# Patient Record
Sex: Female | Born: 1940
Health system: Southern US, Community
[De-identification: ages and names within clinical notes are randomized; demographics above are authoritative.]

## PROBLEM LIST (undated history)

## (undated) DIAGNOSIS — N179 Acute kidney failure, unspecified: Secondary | ICD-10-CM

## (undated) DIAGNOSIS — E785 Hyperlipidemia, unspecified: Secondary | ICD-10-CM

## (undated) DIAGNOSIS — Z923 Personal history of irradiation: Secondary | ICD-10-CM

## (undated) DIAGNOSIS — K921 Melena: Secondary | ICD-10-CM

## (undated) DIAGNOSIS — M8580 Other specified disorders of bone density and structure, unspecified site: Secondary | ICD-10-CM

## (undated) DIAGNOSIS — K219 Gastro-esophageal reflux disease without esophagitis: Secondary | ICD-10-CM

## (undated) DIAGNOSIS — K920 Hematemesis: Secondary | ICD-10-CM

## (undated) DIAGNOSIS — Z8379 Family history of other diseases of the digestive system: Secondary | ICD-10-CM

## (undated) DIAGNOSIS — I10 Essential (primary) hypertension: Secondary | ICD-10-CM

## (undated) DIAGNOSIS — R112 Nausea with vomiting, unspecified: Secondary | ICD-10-CM

## (undated) DIAGNOSIS — K922 Gastrointestinal hemorrhage, unspecified: Secondary | ICD-10-CM

## (undated) DIAGNOSIS — T8859XA Other complications of anesthesia, initial encounter: Secondary | ICD-10-CM

## (undated) DIAGNOSIS — Z9889 Other specified postprocedural states: Secondary | ICD-10-CM

## (undated) DIAGNOSIS — D62 Acute posthemorrhagic anemia: Secondary | ICD-10-CM

## (undated) DIAGNOSIS — Z8719 Personal history of other diseases of the digestive system: Secondary | ICD-10-CM

## (undated) DIAGNOSIS — I739 Peripheral vascular disease, unspecified: Secondary | ICD-10-CM

## (undated) DIAGNOSIS — T4145XA Adverse effect of unspecified anesthetic, initial encounter: Secondary | ICD-10-CM

## (undated) HISTORY — PX: CATARACT EXTRACTION W/ INTRAOCULAR LENS IMPLANT: SHX1309

## (undated) HISTORY — DX: Essential (primary) hypertension: I10

## (undated) HISTORY — DX: Gastro-esophageal reflux disease without esophagitis: K21.9

## (undated) HISTORY — PX: BLADDER SURGERY: SHX569

## (undated) HISTORY — PX: ABDOMINAL HYSTERECTOMY: SHX81

## (undated) HISTORY — DX: Family history of other diseases of the digestive system: Z83.79

## (undated) HISTORY — PX: PARTIAL HYSTERECTOMY: SHX80

## (undated) HISTORY — DX: Personal history of other diseases of the digestive system: Z87.19

## (undated) HISTORY — DX: Hyperlipidemia, unspecified: E78.5

## (undated) HISTORY — PX: BREAST SURGERY: SHX581

---
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
## 1998-10-23 ENCOUNTER — Other Ambulatory Visit: Admission: RE | Admit: 1998-10-23 | Discharge: 1998-10-23 | Payer: Self-pay | Admitting: *Deleted

## 2000-06-16 ENCOUNTER — Encounter: Admission: RE | Admit: 2000-06-16 | Discharge: 2000-06-16 | Payer: Self-pay | Admitting: Family Medicine

## 2000-06-16 ENCOUNTER — Encounter: Payer: Self-pay | Admitting: Family Medicine

## 2000-07-29 ENCOUNTER — Encounter: Payer: Self-pay | Admitting: Neurosurgery

## 2000-07-29 ENCOUNTER — Ambulatory Visit (HOSPITAL_COMMUNITY): Admission: RE | Admit: 2000-07-29 | Discharge: 2000-07-29 | Payer: Self-pay | Admitting: Neurosurgery

## 2000-08-23 ENCOUNTER — Ambulatory Visit (HOSPITAL_COMMUNITY): Admission: RE | Admit: 2000-08-23 | Discharge: 2000-08-24 | Payer: Self-pay | Admitting: Neurosurgery

## 2000-08-23 ENCOUNTER — Encounter: Payer: Self-pay | Admitting: Neurosurgery

## 2000-08-23 HISTORY — PX: CERVICAL DISCECTOMY: SHX98

## 2000-10-13 ENCOUNTER — Encounter: Payer: Self-pay | Admitting: Neurosurgery

## 2000-10-13 ENCOUNTER — Ambulatory Visit (HOSPITAL_COMMUNITY): Admission: RE | Admit: 2000-10-13 | Discharge: 2000-10-13 | Payer: Self-pay | Admitting: Neurosurgery

## 2003-01-24 ENCOUNTER — Ambulatory Visit (HOSPITAL_COMMUNITY): Admission: RE | Admit: 2003-01-24 | Discharge: 2003-01-24 | Payer: Self-pay | Admitting: Gastroenterology

## 2003-02-20 ENCOUNTER — Encounter: Admission: RE | Admit: 2003-02-20 | Discharge: 2003-02-20 | Payer: Self-pay | Admitting: Gastroenterology

## 2003-02-20 ENCOUNTER — Encounter: Payer: Self-pay | Admitting: Gastroenterology

## 2010-07-09 ENCOUNTER — Encounter: Payer: Self-pay | Admitting: Family Medicine

## 2010-07-09 ENCOUNTER — Ambulatory Visit: Payer: Self-pay | Admitting: Cardiovascular Disease

## 2010-07-11 ENCOUNTER — Encounter: Payer: Self-pay | Admitting: Family Medicine

## 2010-08-09 ENCOUNTER — Encounter: Payer: Self-pay | Admitting: Family Medicine

## 2010-08-21 NOTE — Letter (Signed)
Summary: [facility]   Imported By: Maryln Gottron 07/22/2010 10:13:49  _____________________________________________________________________  External Attachment:    Type:   Image     Comment:   External Document

## 2010-12-05 NOTE — Op Note (Signed)
[place]. [facility]  Patient:    Carla Foster, Carla Foster                        MRN: [id number] Proc. Date: 08/23/00 Adm. Date:  [id number] Attending:  Gerald Dexter                           Operative Report  PREOPERATIVE DIAGNOSIS:  Spondylosis, C5-6 left.  Herniated nucleus pulposus C6-7.  POSTOPERATIVE DIAGNOSIS:  Spondylosis, C5-6 left.  Herniated nucleus pulposus C6-7.  OPERATION PERFORMED:  C5-6, C6-7 anterior cervical diskectomy with fibular bone bank fusion followed by Atlantis anterior cervical plating with the operating microscope.  SURGEON:  Reinaldo Meeker, M.D.  ASSISTANT:  Julio Sicks, M.D.  ANESTHESIA:  DESCRIPTION OF PROCEDURE:  After being placed in supine position with 10 pounds halter traction, patients neck was prepped and draped in the usual sterile fashion.  A localizing x-ray was taken with fluoroscopy prior to incision to identify the appropriate level.  A transverse incision was made in the right anterior neck starting at the midline heading towards the medial aspect of the sternocleidomastoid muscle.  The platysma muscle was then incised transversely.  The natural fascial plane between the strap muscles medially, sternocleidomastoid laterally was identified and followed down to the anterior aspect of the cervical spine.  The longus colli muscles were identified and split in the midline and stripped away bilaterally with the [employer] information [employer] manager.  Self-retaining retractor was placed for exposure.  A second fluoroscopy was taken which showed approach to the appropriate levels.  Using a 15 blade, the annulus of the disk at C5-6 and C6-7 was incised.  Using pituitary rongeurs and curets as well as high speed drill to widen the interspace, approximately 90% of the disk  material was removed.  The microscope was then draped and brought into the field and used for the remainder of the case.  Using microdissection  technique, the remainder of the disk material down to the posterior longitudinal ligament of C6-7 was removed.  The posterior longitudinal ligament was then incised transversely and the cut edges removed with the Kerrison punch.  Inspection of the symptomatic left side showed a large amount of disk material within the foramen and this was removed.  This gave excellent decompression of the underlhying C7 nerve root.  A generous decompression was carried out all across the spinal dura and then the proximal C7 nerve root was identified on the right.  At this point inspection was carried out for evidence of any residual compression at this level and none could be identified.  Large amounts of irrigation were carried out and Gelfoam was placed at this time to control bleeding.  Inspection was then turned to C5-6.  Once again high speed drill was used throughout the interspace and disk material removed down to the posterior longitudinal ligament and was once again incised transversely and the cut ends were removed.  A generous foraminal decompression was carried out on the patients left symptomatic side at C5-6 to decompress the underlying C6 nerve root.  At this point, inspection was carried out in all directions for any evidence of residual compression at either level and none could be identified.  Large amounts of irrigation were carried out.  Any bleeding was controlled with bipolar coagulation and Gelfoam.  Measurements were taken and two 6 mm bone bank plugs were reconstituted.  After  irrigating once more, bone was impacted without difficulty.  Fluoroscopy showed the plugs to be in good position.  An appropriate length Atlantis plate was then chosen.  Under fluoroscopic guidance, pilot holes were drilled, tapped and 14 mm screws placed without difficulty.  These were placed up in the C5, C6 and C7 vertebral bodies.  Locking screws were then tightened down.  Large amounts of irrigation were  then carried out at this time and any bleeding controlled with bipolar coagulation.  The wound was then closed using interrupted Vicryl in the platysma muscle and inverted 5-0 PDS in the subcuticular layer and staples on the skin.  Sterile dressing and cervical collar were applied.  The patient was extubated and taken to the recovery room in stable condition. DD:  08/23/00 TD:  08/23/00 Job: [id number]

## 2010-12-05 NOTE — Op Note (Signed)
   NAME:  Carla Foster, Carla Foster                           ACCOUNT NO.:  [id number]   MEDICAL RECORD NO.:  [id number]                   PATIENT TYPE:  AMB   LOCATION:  ENDO                                 FACILITY:  [facility]   PHYSICIAN:  Anselmo Rod, M.D.               DATE OF BIRTH:  1941/05/25   DATE OF PROCEDURE:  01/24/2003  DATE OF DISCHARGE:                                 OPERATIVE REPORT   PROCEDURE PERFORMED:  Colonoscopy.   ENDOSCOPIST:  Anselmo Rod, M.D.   INSTRUMENT USED:  Olympus video colonoscope.   INDICATIONS FOR PROCEDURE:  Iron deficiency anemia.   HISTORY:  This 70 year old white female undergoing EGD to rule out colonic  polyps, masses, hemorrhoids, etc.   PREPROCEDURE PREPARATION:  Informed consent was procured from the patient.  The patient was fasted for eight hours prior to the procedure and prepped  with a bottle of magnesium citrate and a gallon of GoLYTELY the night prior  to the procedure.   PREPROCEDURE PHYSICAL:  VITAL SIGNS:  The patient had stable vital signs.  NECK:  Supple.  CHEST:  Clear to auscultation.  S1 and S2 regular.  ABDOMEN:  Soft with normal bowel sounds.   DESCRIPTION OF PROCEDURE:  The patient was placed in the left lateral  decubitus position and sedated with an additional 20 mg of Demerol and 3 mg  of Versed intravenously. Once the patient was adequately sedated and  maintained on low-flow oxygen and continuous cardiac monitoring, the Olympus  video colonoscope was advanced from the rectum to the cecum with difficulty.  There was a large amount of residual stool in the colon.  No masses or  polyps were seen.  There was no evidence of diverticulosis, small internal  hemorrhoids are present.  On retroflexion in the rectum, the appendicular  orifice and the ileocecal valve were visualized and photographed.  The  patient tolerated the procedure well without complications.  Small lesions  could have been missed.   IMPRESSION:  1.  Small, nonbleeding internal hemorrhoids.  2. No masses, polyps, or diverticulosis noted.  3. Large amount of residual stool in the colon.  Small lesions could have     been missed.    RECOMMENDATIONS:  1. Continue close monitoring of CBC.  2. Outpatient followup in the next two weeks with further recommendations.                                               Anselmo Rod, M.D.    JNM/MEDQ  D:  01/24/2003  T:  01/25/2003  Job:  [id number]   cc:   Marjory Lies, M.D.  [address]  [place]  [place] [address]  Fax: [phone]

## 2010-12-05 NOTE — Op Note (Signed)
   NAME:  Carla Foster, Carla Foster                           ACCOUNT NO.:  [id number]   MEDICAL RECORD NO.:  [id number]                   PATIENT TYPE:  AMB   LOCATION:  ENDO                                 FACILITY:  [facility]   PHYSICIAN:  Anselmo Rod, M.D.               DATE OF BIRTH:  Jun 04, 1941   DATE OF PROCEDURE:  01/24/2003  DATE OF DISCHARGE:                                 OPERATIVE REPORT   PROCEDURE PERFORMED:  Esophagogastroduodenoscopy.   ENDOSCOPIST:  Charna Elizabeth, M.D.   INSTRUMENT USED:  Olympus video panendoscope.   INDICATIONS FOR PROCEDURE:  The patient is a 70 year old with a history of  iron deficiency.  Rule out peptic ulcer disease, esophagitis, gastritis,  etc.   PREPROCEDURE PREPARATION:  Informed consent was procured from the patient.  The patient was fasted for eight hours prior to the procedure and prepped  with  a bottle of magnesium citrate and a gallon of GoLYTELY the night prior  to the procedure.   PREPROCEDURE PHYSICAL:  The patient had stable vital signs.  Neck supple,  chest clear to auscultation.  S1, S2 regular.  Abdomen soft with normal  bowel sounds.   DESCRIPTION OF PROCEDURE:  The patient was placed in the left lateral  decubitus position and sedated with 50 mg of Demerol and 5 mg of Versed  intravenously.  Once the patient was adequately sedated and maintained on  low-flow oxygen and continuous cardiac monitoring, the Olympus video  panendoscope was advanced through the mouth piece over the tongue into the  esophagus under direct vision.  The entire esophagus appeared normal with no  evidence of ring, stricture, masses, esophagitis or Barrett's mucosa.  The  scope was then advanced to the stomach.  The entire gastric mucosa and  proximal small bowel appeared normal.   IMPRESSION:  Normal esophagogastroduodenoscopy.    RECOMMENDATIONS:  Proceed with colonoscopy at this time.  Further  recommendations made after colonoscopy.                                         Anselmo Rod, M.D.    JNM/MEDQ  D:  01/24/2003  T:  01/25/2003  Job:  [id number]   cc:   Marjory Lies, M.D.  [address]  [place]  [place] [address]  Fax: [phone]

## 2011-01-01 ENCOUNTER — Other Ambulatory Visit: Payer: Self-pay | Admitting: Cardiovascular Disease

## 2011-01-01 DIAGNOSIS — E785 Hyperlipidemia, unspecified: Secondary | ICD-10-CM

## 2011-01-01 MED ORDER — FENOFIBRATE 145 MG PO TABS: 145.0000 mg | ORAL_TABLET | Freq: Every day | ORAL | Status: DC

## 2011-01-01 NOTE — Telephone Encounter (Signed)
Refills done, spoke with pt and informed to come in fasting, labs ordered.Alfonso Ramus RN

## 2011-01-13 ENCOUNTER — Other Ambulatory Visit: Payer: Self-pay | Admitting: Cardiovascular Disease

## 2011-01-14 ENCOUNTER — Encounter: Payer: Self-pay | Admitting: *Deleted

## 2011-01-23 ENCOUNTER — Other Ambulatory Visit (INDEPENDENT_AMBULATORY_CARE_PROVIDER_SITE_OTHER): Payer: Medicare Other | Admitting: *Deleted

## 2011-01-23 ENCOUNTER — Encounter: Payer: Self-pay | Admitting: Cardiovascular Disease

## 2011-01-23 ENCOUNTER — Ambulatory Visit (INDEPENDENT_AMBULATORY_CARE_PROVIDER_SITE_OTHER): Payer: Medicare Other | Admitting: Cardiovascular Disease

## 2011-01-23 DIAGNOSIS — E1169 Type 2 diabetes mellitus with other specified complication: Secondary | ICD-10-CM | POA: Insufficient documentation

## 2011-01-23 DIAGNOSIS — E785 Hyperlipidemia, unspecified: Secondary | ICD-10-CM | POA: Insufficient documentation

## 2011-01-23 DIAGNOSIS — I10 Essential (primary) hypertension: Secondary | ICD-10-CM

## 2011-01-23 LAB — BASIC METABOLIC PANEL
BUN: 28 mg/dL — ABNORMAL HIGH (ref 6–23)
CO2: 28 mEq/L (ref 19–32)
Calcium: 9.4 mg/dL (ref 8.4–10.5)
Chloride: 103 mEq/L (ref 96–112)
Creatinine, Ser: 1.1 mg/dL (ref 0.4–1.2)
GFR: 50.87 mL/min — ABNORMAL LOW (ref >60.00)
Glucose, Bld: 121 mg/dL — ABNORMAL HIGH (ref 70–99)
Potassium: 5.3 mEq/L — ABNORMAL HIGH (ref 3.5–5.1)
Sodium: 136 mEq/L (ref 135–145)

## 2011-01-23 LAB — LIPID PANEL
Cholesterol: 145 mg/dL (ref 0–200)
HDL: 69.2 mg/dL (ref >39.00)
LDL Cholesterol: 64 mg/dL (ref 0–99)
Total CHOL/HDL Ratio: 2
Triglycerides: 59 mg/dL (ref 0.0–149.0)
VLDL: 11.8 mg/dL (ref 0.0–40.0)

## 2011-01-23 LAB — HEPATIC FUNCTION PANEL
ALT: 18 U/L (ref 0–35)
AST: 29 U/L (ref 0–37)
Albumin: 4.4 g/dL (ref 3.5–5.2)
Alkaline Phosphatase: 21 U/L — ABNORMAL LOW (ref 39–117)
Bilirubin, Direct: 0 mg/dL (ref 0.0–0.3)
Total Bilirubin: 0.2 mg/dL — ABNORMAL LOW (ref 0.3–1.2)
Total Protein: 6.9 g/dL (ref 6.0–8.3)

## 2011-01-23 NOTE — Progress Notes (Signed)
Carla Foster Date of Birth  1941/06/29 [facility] [address]     [address] [place], [place]  [address] [phone]  Fax  [phone]  History of Present Illness:  Doing well.  Ate some ribs several days ago.  BP is up a bit.  She denies any chest pain or shortness of breath.  Current Outpatient Prescriptions on File Prior to Visit  Medication Sig Dispense Refill  . amLODipine (NORVASC) 5 MG tablet TAKE 1 TABLET BY MOUTH DAILY  90 tablet  0  . aspirin 81 MG tablet Take 81 mg by mouth daily.        . Calcium Carbonate-Vitamin D (CALCIUM 600 + D PO) Take 1 tablet by mouth daily.        . carvedilol (COREG) 12.5 MG tablet Take 12.5 mg by mouth 2 (two) times daily with a meal.        . cholecalciferol (VITAMIN D) 1000 UNITS tablet Take 1,000 Units by mouth daily.        . fish oil-omega-3 fatty acids 1000 MG capsule Take 1 g by mouth daily.        . Flaxseed, Linseed, (FLAX SEED OIL PO) Take 1 capsule by mouth daily.        Marland Kitchen lisinopril (PRINIVIL,ZESTRIL) 40 MG tablet TAKE 1 TABLET BY MOUTH DAILY  90 tablet  0  . metFORMIN (GLUCOPHAGE) 1000 MG tablet Take 1,000 mg by mouth 2 (two) times daily with a meal.        . simvastatin (ZOCOR) 20 MG tablet TAKE 1 TABLET BY MOUTH AT BEDTIME  90 tablet  3  . THYROID PO Take 1 tablet by mouth daily. ( OTC thyroid herb )       . TRICOR 145 MG tablet TAKE 1 TABLET BY MOUTH AT BEDTIME  90 tablet  3  . vitamin C (ASCORBIC ACID) 500 MG tablet Take 500 mg by mouth daily.          Allergies  Allergen Reactions  . Tiazac (Diltiazem Hcl)     Ankle edema    Past Medical History  Diagnosis Date  . Hypertension   . Hyperlipidemia   . Diabetes mellitus     type 2  . Asthma     Past Surgical History  Procedure Date  . Partial hysterectomy   . Cervical discectomy 08/23/00    C5-6, C6-7 anterior cervical diskectomy with fibular bone bank fusion followed by Atlantis anterior cervical plating with the  operating microscope  --  SURGEON:  Reinaldo Meeker, M.D.    History  Smoking status  . Former Smoker -- 1.0 packs/day for 18 years  . Types: Cigarettes  . Quit date: 01/13/1974  Smokeless tobacco  . Never Used    History  Alcohol Use No    Family History  Problem Relation Age of Onset  . Emphysema Father   . Stroke Mother   . Hypertension Mother   . Atrial fibrillation Mother     heart flutter    Reviw of Systems:  Reviewed in the HPI.  All other systems are negative.  Physical Exam: BP 152/80  Pulse 70  Ht 5\' 6"  (1.676 m)  Wt 173 lb (78.472 kg)  BMI 27.92 kg/m2 The patient is alert and oriented x 3.  The mood and affect are normal.   Skin: warm and dry.  Color is normal.    HEENT:   the sclera are nonicteric.  The mucous membranes are moist.  The carotids are 2+ without bruits.  There is no thyromegaly.  There is no JVD.    Lungs: clear.  The chest wall is non tender.    Heart: regular rate with a normal S1 and S2.  There are no murmurs, gallops, or rubs. The PMI is not displaced.     Abdomin: good bowel sounds.  There is no guarding or rebound.  There is no hepatosplenomegaly or tenderness.  There are no masses.   Extremities:  no clubbing, cyanosis, or edema.  The legs are without rashes.  The distal pulses are intact.   Neuro:  Cranial nerves II - XII are intact.  Motor and sensory functions are intact.    The gait is normal.  Assessment / Plan:

## 2011-01-23 NOTE — Assessment & Plan Note (Signed)
Her blood pressure remains well controlled. We'll continue with her same medications. I've asked her to watch her salt intake and to exercise on a daily basis.

## 2011-04-08 ENCOUNTER — Other Ambulatory Visit: Payer: Self-pay | Admitting: *Deleted

## 2011-04-08 DIAGNOSIS — E785 Hyperlipidemia, unspecified: Secondary | ICD-10-CM

## 2011-04-08 MED ORDER — AMLODIPINE BESYLATE 5 MG PO TABS: 5.0000 mg | ORAL_TABLET | Freq: Every day | ORAL | Status: DC

## 2011-04-08 MED ORDER — LISINOPRIL 40 MG PO TABS: 40.0000 mg | ORAL_TABLET | Freq: Every day | ORAL | Status: DC

## 2011-04-08 NOTE — Telephone Encounter (Signed)
Fax received from pharmacy. Refill completed. Jodette Saory Carriero RN  

## 2011-07-15 ENCOUNTER — Other Ambulatory Visit: Payer: Self-pay | Admitting: *Deleted

## 2011-07-15 MED ORDER — CARVEDILOL 12.5 MG PO TABS: 12.5000 mg | ORAL_TABLET | Freq: Two times a day (BID) | ORAL | Status: DC

## 2011-07-28 ENCOUNTER — Ambulatory Visit (INDEPENDENT_AMBULATORY_CARE_PROVIDER_SITE_OTHER): Payer: Medicare Other | Admitting: Cardiovascular Disease

## 2011-07-28 ENCOUNTER — Encounter: Payer: Self-pay | Admitting: Cardiovascular Disease

## 2011-07-28 VITALS — BP 188/78 | HR 79 | Ht 67.0 in | Wt 163.0 lb

## 2011-07-28 DIAGNOSIS — I10 Essential (primary) hypertension: Secondary | ICD-10-CM

## 2011-07-28 DIAGNOSIS — E785 Hyperlipidemia, unspecified: Secondary | ICD-10-CM

## 2011-07-28 MED ORDER — NEBIVOLOL HCL 20 MG PO TABS: 20.0000 mg | ORAL_TABLET | ORAL | Status: DC

## 2011-07-28 MED ORDER — FENOFIBRATE 160 MG PO TABS: 160.0000 mg | ORAL_TABLET | Freq: Every day | ORAL | Status: DC

## 2011-07-28 NOTE — Assessment & Plan Note (Signed)
Carla Foster still has an elevated blood pressure. It is clear that she still eating salty food. She had fried bologna this morning for breakfast.  I've asked her to decrease her salt intake. We'll stop the carvedilol and start her on Bystolic 20 mg a day. I'll see her back in the office in 3 months.

## 2011-07-28 NOTE — Progress Notes (Signed)
Carla Foster Date of Birth  09-16-1940 [name]  [address]    [address]   [address] [place], [place]  [address]    [place], [place]  [address] [phone]  Fax  [phone]  [phone]  Fax [phone]   History of Present Illness:  Carla Foster is a 71 year old female with a history of hypertension and hyperlipidemia. She also has a history of type 2 diabetes mellitus. She's been having problems with elevated blood pressure recently. I saw her in July for hypertension.  She feels fine. She's not had any episodes of chest pain or shortness breath. She continues to lose weight.  She does need a lot of salt but admits to eating some fried bologna this morning.  Current Outpatient Prescriptions on File Prior to Visit  Medication Sig Dispense Refill  . amLODipine (NORVASC) 5 MG tablet Take 1 tablet (5 mg total) by mouth daily.  90 tablet  1  . aspirin 81 MG tablet Take 81 mg by mouth daily.        . Calcium Carbonate-Vitamin D (CALCIUM 600 + D PO) Take 1 tablet by mouth daily.        . carvedilol (COREG) 12.5 MG tablet Take 1 tablet (12.5 mg total) by mouth 2 (two) times daily with a meal.  60 tablet  6  . cholecalciferol (VITAMIN D) 1000 UNITS tablet Take 1,000 Units by mouth daily.        . fish oil-omega-3 fatty acids 1000 MG capsule Take 1 g by mouth daily.        Carla Foster lisinopril (PRINIVIL,ZESTRIL) 40 MG tablet Take 1 tablet (40 mg total) by mouth daily.  90 tablet  1  . metFORMIN (GLUCOPHAGE) 1000 MG tablet Take 1,000 mg by mouth 2 (two) times daily with a meal.        . simvastatin (ZOCOR) 20 MG tablet TAKE 1 TABLET BY MOUTH AT BEDTIME  90 tablet  3  . THYROID PO Take 1 tablet by mouth daily. ( OTC thyroid herb )       . TRICOR 145 MG tablet TAKE 1 TABLET BY MOUTH AT BEDTIME  90 tablet  3  . vitamin C (ASCORBIC ACID) 500 MG tablet Take 500 mg by mouth daily.        . Flaxseed, Linseed, (FLAX SEED OIL PO) Take 1 capsule by mouth daily.           Allergies  Allergen Reactions  . Tiazac (Diltiazem Hcl)     Ankle edema    Past Medical History  Diagnosis Date  . Hypertension   . Hyperlipidemia   . Diabetes mellitus     type 2  . Asthma     Past Surgical History  Procedure Date  . Partial hysterectomy   . Cervical discectomy 08/23/00    C5-6, C6-7 anterior cervical diskectomy with fibular bone bank fusion followed by Atlantis anterior cervical plating with the operating microscope  --  SURGEON:  Reinaldo Meeker, M.D.    History  Smoking status  . Former Smoker -- 1.0 packs/day for 18 years  . Types: Cigarettes  . Quit date: 01/13/1974  Smokeless tobacco  . Never Used    History  Alcohol Use No    Family History  Problem Relation Age of Onset  . Emphysema Father   . Stroke Mother   . Hypertension Mother   . Atrial fibrillation Mother  heart flutter    Reviw of Systems:  Reviewed in the HPI.  All other systems are negative.  Physical Exam: BP 188/78  Pulse 79  Ht 5\' 7"  (1.702 m)  Wt 163 lb (73.936 kg)  BMI 25.53 kg/m2 The patient is alert and oriented x 3.  The mood and affect are normal.   Skin: warm and dry.  Color is normal.    HEENT:   Normocephalic/atraumatic. Her neck is supple. Mucous membranes are moist. The carotids are 2+ without bruits. Is no JVD.  Lungs: Lungs are clear.   Heart: Regular rate S1-S2.    Abdomen: Good bowel sounds. There was no hepatosplenomegaly.  Extremities:  No clubbing cyanosis or edema  Neuro:  Exam is nonfocal.    ECG: Normal sinus rhythm. She has no ST or T wave changes.  Assessment / Plan:

## 2011-07-28 NOTE — Patient Instructions (Signed)
Your physician wants you to follow-up in: 3 MONTH You will receive a reminder letter in the mail two months in advance. If you don't receive a letter, please call our office to schedule the follow-up appointment.  Your physician recommends that you return for a FASTING lipid profile: 3 MONTHS   Your physician has recommended you make the following change in your medication:   STOP COREG/ NOT STRONG ENOUGH FOR YOUR HTN  START BYSTOLIC 20 MG DAILY  AVOID SALTY FOOD LIKE BOLOGNA, HAM, FAST FOOD, FRIED FOODS.

## 2011-07-30 ENCOUNTER — Telehealth: Payer: Self-pay | Admitting: Cardiovascular Disease

## 2011-07-30 NOTE — Telephone Encounter (Signed)
Left message

## 2011-07-30 NOTE — Telephone Encounter (Signed)
New problem:  Please clarify which medication patient suppose to be discontinue.

## 2011-07-30 NOTE — Telephone Encounter (Signed)
Spoke with patient and advised it was the Carvedilol she needed to stop per Dr Harvie Bridge dictation.  She was uncertain on Rx

## 2011-10-12 ENCOUNTER — Other Ambulatory Visit: Payer: Self-pay | Admitting: *Deleted

## 2011-10-12 DIAGNOSIS — E785 Hyperlipidemia, unspecified: Secondary | ICD-10-CM

## 2011-10-12 MED ORDER — AMLODIPINE BESYLATE 5 MG PO TABS: 5.0000 mg | ORAL_TABLET | Freq: Every day | ORAL | Status: DC

## 2011-10-12 MED ORDER — LISINOPRIL 40 MG PO TABS: 40.0000 mg | ORAL_TABLET | Freq: Every day | ORAL | Status: DC

## 2011-10-12 NOTE — Telephone Encounter (Signed)
Fax Received. Refill Completed. Darrah Dredge Chowoe (R.M.A)   

## 2011-10-12 NOTE — Telephone Encounter (Signed)
Fax Received. Refill Completed. Carla Foster (R.M.A)   

## 2011-10-26 ENCOUNTER — Ambulatory Visit: Payer: Medicare Other | Admitting: Cardiovascular Disease

## 2011-11-18 ENCOUNTER — Ambulatory Visit (INDEPENDENT_AMBULATORY_CARE_PROVIDER_SITE_OTHER): Payer: Medicare Other | Admitting: Cardiovascular Disease

## 2011-11-18 ENCOUNTER — Encounter: Payer: Self-pay | Admitting: Cardiovascular Disease

## 2011-11-18 VITALS — BP 135/80 | HR 69 | Ht 67.0 in | Wt 160.4 lb

## 2011-11-18 DIAGNOSIS — E785 Hyperlipidemia, unspecified: Secondary | ICD-10-CM

## 2011-11-18 DIAGNOSIS — I10 Essential (primary) hypertension: Secondary | ICD-10-CM

## 2011-11-18 MED ORDER — CARVEDILOL 25 MG PO TABS: 25.0000 mg | ORAL_TABLET | Freq: Two times a day (BID) | ORAL | Status: DC

## 2011-11-18 NOTE — Progress Notes (Signed)
Carla Foster Date of Birth  [birth year] [facility] Office  [address]    [address]   [address] [place], [place]  [address]    [place], [place]  [address] [phone]  Fax  [phone]  [phone]  Fax [phone]  Problems: 1. Hypertension 2. Hyperlipidemia 3. Diabetes mellitus  History of Present Illness:  [name] is a 71 year old female with a history of hypertension and hyperlipidemia. She also has a history of type 2 diabetes mellitus. She's been having problems with elevated blood pressure recently. I saw her in July for hypertension.  She feels fine. She's not had any episodes of chest pain or shortness breath. She continues to lose weight.  She has been found to watch her salt intake. She used to use a lot of fried bologna.  Current Outpatient Prescriptions on File Prior to Visit  Medication Sig Dispense Refill  . amLODipine (NORVASC) 5 MG tablet Take 1 tablet (5 mg total) by mouth daily.  90 tablet  3  . aspirin 81 MG tablet Take 81 mg by mouth daily.        . Calcium Carbonate-Vitamin D (CALCIUM 600 + D PO) Take 1 tablet by mouth daily.        . cholecalciferol (VITAMIN D) 1000 UNITS tablet Take 1,000 Units by mouth daily.        . fenofibrate 160 MG tablet Take 1 tablet (160 mg total) by mouth daily.  30 tablet  5  . fish oil-omega-3 fatty acids 1000 MG capsule Take 1 g by mouth daily.        . Flaxseed, Linseed, (FLAX SEED OIL PO) Take 1 capsule by mouth daily.        Marland Kitchen lisinopril (PRINIVIL,ZESTRIL) 40 MG tablet Take 1 tablet (40 mg total) by mouth daily.  90 tablet  3  . metFORMIN (GLUCOPHAGE) 1000 MG tablet Take 1,000 mg by mouth 2 (two) times daily with a meal.        . Nebivolol HCl (BYSTOLIC) 20 MG TABS Take 1 tablet (20 mg total) by mouth 1 day or 1 dose.  30 tablet  5  . simvastatin (ZOCOR) 20 MG tablet TAKE 1 TABLET BY MOUTH AT BEDTIME  90 tablet  3  . THYROID PO Take 1 tablet by mouth daily. ( OTC thyroid herb )         . vitamin C (ASCORBIC ACID) 500 MG tablet Take 500 mg by mouth daily.          Allergies  Allergen Reactions  . Tiazac (Diltiazem Hcl)     Ankle edema    Past Medical History  Diagnosis Date  . Hypertension   . Hyperlipidemia   . Diabetes mellitus     type 2  . Asthma     Past Surgical History  Procedure Date  . Partial hysterectomy   . Cervical discectomy 08/23/00    C5-6, C6-7 anterior cervical diskectomy with fibular bone bank fusion followed by Atlantis anterior cervical plating with the operating microscope  --  SURGEON:  Reinaldo Meeker, M.D.    History  Smoking status  . Former Smoker -- 1.0 packs/day for 18 years  . Types: Cigarettes  . Quit date: 01/13/1974  Smokeless tobacco  . Never Used    History  Alcohol Use No    Family History  Problem Relation Age of Onset  . Emphysema Father   . Stroke Mother   .  Hypertension Mother   . Atrial fibrillation Mother     heart flutter    Reviw of Systems:  Reviewed in the HPI.  All other systems are negative.  Physical Exam: BP 162/90  Pulse 69  Ht 5\' 7"  (1.702 m)  Wt 160 lb 6.4 oz (72.757 kg)  BMI 25.12 kg/m2 The patient is alert and oriented x 3.  The mood and affect are normal.   Skin: warm and dry.  Color is normal.    HEENT:   Normocephalic/atraumatic. Her neck is supple. Mucous membranes are moist. The carotids are 2+ without bruits. Is no JVD.  Lungs: Lungs are clear.   Heart: Regular rate S1-S2.  Soft systolic murmur.  Abdomen: Good bowel sounds. There was no hepatosplenomegaly.  Extremities:  No clubbing cyanosis or edema  Neuro:  Exam is nonfocal.    ECG: Normal sinus rhythm. She has no ST or T wave changes.  Assessment / Plan:

## 2011-11-18 NOTE — Assessment & Plan Note (Addendum)
Stable

## 2011-11-18 NOTE — Assessment & Plan Note (Addendum)
Carla Foster is doing fairly well.  She says that the Bystolic is  too expensive. We will change her back to carvedilol but at a slightly higher dose-25 mg twice a day. I'll see her again in 3 months for followup visit.

## 2011-11-18 NOTE — Patient Instructions (Signed)
Your physician recommends that you schedule a follow-up appointment in: 3 months   Your physician has recommended you make the following change in your medication:   Stop bystolic Start carvedilol 25 mg one tablet twice a day 12 hours apart

## 2011-12-28 ENCOUNTER — Other Ambulatory Visit: Payer: Self-pay | Admitting: *Deleted

## 2011-12-28 DIAGNOSIS — E785 Hyperlipidemia, unspecified: Secondary | ICD-10-CM

## 2011-12-28 MED ORDER — SIMVASTATIN 20 MG PO TABS: 20.0000 mg | ORAL_TABLET | Freq: Every day | ORAL | Status: DC

## 2012-01-26 ENCOUNTER — Other Ambulatory Visit: Payer: Self-pay | Admitting: Cardiovascular Disease

## 2012-02-18 ENCOUNTER — Ambulatory Visit: Payer: Medicare Other | Admitting: Cardiovascular Disease

## 2012-02-19 ENCOUNTER — Other Ambulatory Visit: Payer: Medicare Other

## 2012-09-29 ENCOUNTER — Other Ambulatory Visit: Payer: Self-pay | Admitting: *Deleted

## 2012-09-29 DIAGNOSIS — E785 Hyperlipidemia, unspecified: Secondary | ICD-10-CM

## 2012-09-29 MED ORDER — AMLODIPINE BESYLATE 5 MG PO TABS: 5.0000 mg | ORAL_TABLET | Freq: Every day | ORAL | Status: DC

## 2012-09-29 MED ORDER — LISINOPRIL 40 MG PO TABS: 40.0000 mg | ORAL_TABLET | Freq: Every day | ORAL | Status: DC

## 2012-09-29 NOTE — Telephone Encounter (Signed)
Fax Received. Refill Completed. Carla Foster (R.M.A)   

## 2012-09-29 NOTE — Telephone Encounter (Signed)
Fax Received. Refill Completed. Lilly Gasser Chowoe (R.M.A)  NEED APPOINTMENT 

## 2012-11-02 ENCOUNTER — Other Ambulatory Visit: Payer: Self-pay | Admitting: *Deleted

## 2012-11-02 MED ORDER — FENOFIBRATE 160 MG PO TABS: ORAL_TABLET | ORAL | Status: DC

## 2012-11-09 ENCOUNTER — Other Ambulatory Visit: Payer: Self-pay | Admitting: *Deleted

## 2012-11-09 MED ORDER — CARVEDILOL 25 MG PO TABS: 25.0000 mg | ORAL_TABLET | Freq: Two times a day (BID) | ORAL | Status: DC

## 2012-11-09 NOTE — Telephone Encounter (Signed)
Need appointment. Called pt and lm for pt to call office to make 1 year ov. Number provided. Fax Received. Refill Completed. Dastan Krider Chowoe (R.M.A)

## 2013-01-02 ENCOUNTER — Other Ambulatory Visit: Payer: Self-pay | Admitting: Cardiovascular Disease

## 2013-01-02 NOTE — Telephone Encounter (Signed)
Fax Received. Refill Completed. Braiden Rodman Chowoe (R.M.A)   

## 2013-01-10 ENCOUNTER — Other Ambulatory Visit: Payer: Self-pay | Admitting: *Deleted

## 2013-01-10 DIAGNOSIS — E785 Hyperlipidemia, unspecified: Secondary | ICD-10-CM

## 2013-01-10 MED ORDER — LISINOPRIL 40 MG PO TABS: 40.0000 mg | ORAL_TABLET | Freq: Every day | ORAL | Status: DC

## 2013-01-10 NOTE — Telephone Encounter (Signed)
NO REFILLS UNTIL APPOINTMENT Fax Received. Refill Completed. Rodolph Hagemann Chowoe (R.M.A)   

## 2013-01-17 ENCOUNTER — Ambulatory Visit (INDEPENDENT_AMBULATORY_CARE_PROVIDER_SITE_OTHER): Payer: Medicare Other | Admitting: Cardiovascular Disease

## 2013-01-17 ENCOUNTER — Encounter: Payer: Self-pay | Admitting: Cardiovascular Disease

## 2013-01-17 VITALS — BP 144/70 | HR 66 | Ht 67.0 in | Wt 159.0 lb

## 2013-01-17 DIAGNOSIS — E785 Hyperlipidemia, unspecified: Secondary | ICD-10-CM

## 2013-01-17 DIAGNOSIS — I10 Essential (primary) hypertension: Secondary | ICD-10-CM

## 2013-01-17 LAB — HEPATIC FUNCTION PANEL
ALT: 14 U/L (ref 0–35)
AST: 24 U/L (ref 0–37)
Albumin: 3.9 g/dL (ref 3.5–5.2)
Total Protein: 7.3 g/dL (ref 6.0–8.3)

## 2013-01-17 LAB — LIPID PANEL
Cholesterol: 148 mg/dL (ref 0–200)
HDL: 55.5 mg/dL (ref >39.00)
Triglycerides: 89 mg/dL (ref 0.0–149.0)

## 2013-01-17 LAB — BASIC METABOLIC PANEL
CO2: 24 mEq/L (ref 19–32)
Calcium: 9.7 mg/dL (ref 8.4–10.5)
Creatinine, Ser: 1 mg/dL (ref 0.4–1.2)
GFR: 55.1 mL/min — ABNORMAL LOW (ref >60.00)
Sodium: 128 mEq/L — ABNORMAL LOW (ref 135–145)

## 2013-01-17 NOTE — Assessment & Plan Note (Signed)
She is still eating salt.  I have advised her to work on that diet. Will check labs today.  I will see her in 1 year.

## 2013-01-17 NOTE — Patient Instructions (Addendum)
Your physician recommends that you return for a FASTING lipid profile: today  Your physician wants you to follow-up in: 1 year  You will receive a reminder letter in the mail two months in advance. If you don't receive a letter, please call our office to schedule the follow-up appointment.  REDUCE HIGH SODIUM FOODS LIKE CANNED SOUP, GRAVY, SAUCES, READY PREPARED FOODS LIKE FROZEN FOODS; LEAN CUISINE, LASAGNA. BACON, SAUSAGE, LUNCH MEAT, FAST FOODS.Marland Kitchenchips and hotdogs  DASH Diet The DASH diet stands for "Dietary Approaches to Stop Hypertension." It is a healthy eating plan that has been shown to reduce high blood pressure (hypertension) in as little as 14 days, while also possibly providing other significant health benefits. These other health benefits include reducing the risk of breast cancer after menopause and reducing the risk of type 2 diabetes, heart disease, colon cancer, and stroke. Health benefits also include weight loss and slowing kidney failure in patients with chronic kidney disease.  DIET GUIDELINES  Limit salt (sodium). Your diet should contain less than 1500 mg of sodium daily.  Limit refined or processed carbohydrates. Your diet should include mostly whole grains. Desserts and added sugars should be used sparingly.  Include small amounts of heart-healthy fats. These types of fats include nuts, oils, and tub margarine. Limit saturated and trans fats. These fats have been shown to be harmful in the body. CHOOSING FOODS  The following food groups are based on a 2000 calorie diet. See your Registered Dietitian for individual calorie needs. Grains and Grain Products (6 to 8 servings daily)  Eat More Often: Whole-wheat bread, brown rice, whole-grain or wheat pasta, quinoa, popcorn without added fat or salt (air popped).  Eat Less Often: White bread, white pasta, white rice, cornbread. Vegetables (4 to 5 servings daily)  Eat More Often: Fresh, frozen, and canned vegetables.  Vegetables may be raw, steamed, roasted, or grilled with a minimal amount of fat.  Eat Less Often/Avoid: Creamed or fried vegetables. Vegetables in a cheese sauce. Fruit (4 to 5 servings daily)  Eat More Often: All fresh, canned (in natural juice), or frozen fruits. Dried fruits without added sugar. One hundred percent fruit juice ( cup [237 mL] daily).  Eat Less Often: Dried fruits with added sugar. Canned fruit in light or heavy syrup. [employer], Fish, and Poultry (2 servings or less daily. One serving is 3 to 4 oz [85-114 g]).  Eat More Often: Ninety percent or leaner ground beef, tenderloin, sirloin. Round cuts of beef, chicken breast, [place] breast. All fish. Grill, bake, or broil your meat. Nothing should be fried.  Eat Less Often/Avoid: Fatty cuts of meat, [place], or chicken leg, thigh, or wing. Fried cuts of meat or fish. Dairy (2 to 3 servings)  Eat More Often: Low-fat or fat-free milk, low-fat plain or light yogurt, reduced-fat or part-skim cheese.  Eat Less Often/Avoid: Milk (whole, 2%).Whole milk yogurt. Full-fat cheeses. Nuts, Seeds, and Legumes (4 to 5 servings per week)  Eat More Often: All without added salt.  Eat Less Often/Avoid: Salted nuts and seeds, canned beans with added salt. Fats and Sweets (limited)  Eat More Often: Vegetable oils, tub margarines without trans fats, sugar-free gelatin. Mayonnaise and salad dressings.  Eat Less Often/Avoid: Coconut oils, palm oils, butter, stick margarine, cream, half and half, cookies, candy, pie. FOR MORE INFORMATION The Dash Diet Eating Plan: www.dashdiet.org Document Released: 06/25/2011 Document Revised: 09/28/2011 Document Reviewed: 06/25/2011 [facility] Patient Information 2014 [place], [place].

## 2013-01-17 NOTE — Assessment & Plan Note (Signed)
Will check fasting labs tomorrow  

## 2013-01-17 NOTE — Progress Notes (Signed)
Marchia Bond Bardsley Date of Birth  11-May-1941 [facility]  [address]    [address]   [address] [place], [place]  [address]    [place], [place]  [address] [phone]  Fax  [phone]  [phone]  Fax [phone]  Problems: 1. Hypertension 2. Hyperlipidemia 3. Diabetes mellitus  History of Present Illness:  Mrs. Victoriano Lain is a 72 year old female with a history of hypertension and hyperlipidemia. She also has a history of type 2 diabetes mellitus. She's been having problems with elevated blood pressure recently. I saw her in July for hypertension.  She feels fine. She's not had any episodes of chest pain or shortness breath. She continues to lose weight.  She has been told  to watch her salt intake. She used to use a lot of fried bologna.  January 17, 2013:  She is doing well.  No CP , no dyspnea.   Still eating salt.   Current Outpatient Prescriptions on File Prior to Visit  Medication Sig Dispense Refill  . amLODipine (NORVASC) 5 MG tablet Take 1 tablet (5 mg total) by mouth daily.  90 tablet  0  . aspirin 81 MG tablet Take 81 mg by mouth daily.        . Calcium Carbonate-Vitamin D (CALCIUM 600 + D PO) Take 1 tablet by mouth daily.        . carvedilol (COREG) 25 MG tablet Take 1 tablet (25 mg total) by mouth 2 (two) times daily.  60 tablet  1  . cholecalciferol (VITAMIN D) 1000 UNITS tablet Take 1,000 Units by mouth daily.        . fenofibrate 160 MG tablet TAKE 1 TABLET BY MOUTH DAILY  30 tablet  0  . fish oil-omega-3 fatty acids 1000 MG capsule Take 1 g by mouth daily.        . Flaxseed, Linseed, (FLAX SEED OIL PO) Take 1 capsule by mouth daily.        Marland Kitchen lisinopril (PRINIVIL,ZESTRIL) 40 MG tablet Take 1 tablet (40 mg total) by mouth daily.  30 tablet  0  . metFORMIN (GLUCOPHAGE) 1000 MG tablet Take 1,000 mg by mouth 2 (two) times daily with a meal.        . simvastatin (ZOCOR) 20 MG tablet Take 1 tablet (20 mg total) by mouth at bedtime.  90  tablet  3  . THYROID PO Take 1 tablet by mouth daily. ( OTC thyroid herb )       . vitamin C (ASCORBIC ACID) 500 MG tablet Take 500 mg by mouth daily.         No current facility-administered medications on file prior to visit.    Allergies  Allergen Reactions  . Tiazac (Diltiazem Hcl)     Ankle edema    Past Medical History  Diagnosis Date  . Hypertension   . Hyperlipidemia   . Diabetes mellitus     type 2  . Asthma     Past Surgical History  Procedure Laterality Date  . Partial hysterectomy    . Cervical discectomy  08/23/00    C5-6, C6-7 anterior cervical diskectomy with fibular bone bank fusion followed by Atlantis anterior cervical plating with the operating microscope  --  SURGEON:  Reinaldo Meeker, M.D.    History  Smoking status  . Former Smoker -- 1.00 packs/day for 18 years  . Types: Cigarettes  . Quit date: 01/13/1974  Smokeless tobacco  .  Never Used    History  Alcohol Use No    Family History  Problem Relation Age of Onset  . Emphysema Father   . Stroke Mother   . Hypertension Mother   . Atrial fibrillation Mother     heart flutter    Reviw of Systems:  Reviewed in the HPI.  All other systems are negative.  Physical Exam: BP 144/70  Pulse 66  Ht 5\' 7"  (1.702 m)  Wt 159 lb (72.122 kg)  BMI 24.9 kg/m2 The patient is alert and oriented x 3.  The mood and affect are normal.   Skin: warm and dry.  Color is normal.    HEENT:   Normocephalic/atraumatic. Her neck is supple. Mucous membranes are moist. The carotids are 2+ without bruits. Is no JVD.  Lungs: Lungs are clear.   Heart: Regular rate S1-S2.  Soft systolic murmur.  Abdomen: Good bowel sounds. There was no hepatosplenomegaly.  Extremities:  No clubbing cyanosis or edema  Neuro:  Exam is nonfocal.    ECG: January 17, 2013:  Normal sinus rhythm at 66, . She has no ST or T wave changes.  Assessment / Plan:

## 2013-01-18 ENCOUNTER — Other Ambulatory Visit: Payer: Self-pay | Admitting: *Deleted

## 2013-01-18 MED ORDER — CARVEDILOL 25 MG PO TABS: 25.0000 mg | ORAL_TABLET | Freq: Two times a day (BID) | ORAL | Status: DC

## 2013-01-18 NOTE — Telephone Encounter (Signed)
Fax Received. Refill Completed. Carla Foster (R.M.A)   

## 2013-01-24 ENCOUNTER — Other Ambulatory Visit: Payer: Self-pay | Admitting: Cardiovascular Disease

## 2013-01-24 ENCOUNTER — Other Ambulatory Visit: Payer: Self-pay | Admitting: *Deleted

## 2013-01-24 DIAGNOSIS — E785 Hyperlipidemia, unspecified: Secondary | ICD-10-CM

## 2013-01-24 MED ORDER — FENOFIBRATE 160 MG PO TABS: 160.0000 mg | ORAL_TABLET | Freq: Every day | ORAL | Status: DC

## 2013-01-24 NOTE — Telephone Encounter (Signed)
Fax Received. Refill Completed. Carla Foster (R.M.A)   

## 2013-02-01 ENCOUNTER — Other Ambulatory Visit: Payer: Self-pay | Admitting: Cardiovascular Disease

## 2013-02-01 NOTE — Telephone Encounter (Signed)
Fax Received. Refill Completed. Kendarius Vigen Chowoe (R.M.A)   

## 2013-02-20 ENCOUNTER — Other Ambulatory Visit: Payer: Self-pay | Admitting: Otolaryngology

## 2013-02-20 DIAGNOSIS — H7010 Chronic mastoiditis, unspecified ear: Secondary | ICD-10-CM

## 2013-02-20 DIAGNOSIS — H905 Unspecified sensorineural hearing loss: Secondary | ICD-10-CM

## 2013-02-27 ENCOUNTER — Ambulatory Visit
Admission: RE | Admit: 2013-02-27 | Discharge: 2013-02-27 | Disposition: A | Discharge status: Home / Self Care | Payer: Medicare Other | Source: Ambulatory Visit | Attending: Otolaryngology | Admitting: Otolaryngology

## 2013-02-27 DIAGNOSIS — H7010 Chronic mastoiditis, unspecified ear: Secondary | ICD-10-CM

## 2013-02-27 DIAGNOSIS — H905 Unspecified sensorineural hearing loss: Secondary | ICD-10-CM

## 2013-02-27 MED ORDER — IOHEXOL 300 MG/ML  SOLN
75.0000 mL | Freq: Once | INTRAMUSCULAR | Status: AC | PRN
  Administered 2013-02-27: 75 mL via INTRAVENOUS

## 2013-05-27 ENCOUNTER — Other Ambulatory Visit: Payer: Self-pay | Admitting: Cardiovascular Disease

## 2013-08-15 ENCOUNTER — Other Ambulatory Visit: Payer: Self-pay

## 2013-08-15 MED ORDER — CARVEDILOL 25 MG PO TABS: 25.0000 mg | ORAL_TABLET | Freq: Two times a day (BID) | ORAL | Status: DC

## 2013-09-15 ENCOUNTER — Other Ambulatory Visit: Payer: Self-pay | Admitting: Cardiovascular Disease

## 2013-09-22 ENCOUNTER — Other Ambulatory Visit: Payer: Self-pay | Admitting: Cardiovascular Disease

## 2014-01-09 ENCOUNTER — Other Ambulatory Visit: Payer: Self-pay | Admitting: Cardiovascular Disease

## 2014-01-29 ENCOUNTER — Ambulatory Visit: Payer: Medicare Other | Admitting: Cardiovascular Disease

## 2014-02-07 ENCOUNTER — Other Ambulatory Visit: Payer: Self-pay | Admitting: Cardiovascular Disease

## 2014-02-12 ENCOUNTER — Other Ambulatory Visit: Payer: Self-pay

## 2014-02-12 MED ORDER — LISINOPRIL 40 MG PO TABS: ORAL_TABLET | ORAL | Status: DC

## 2014-02-12 MED ORDER — CARVEDILOL 25 MG PO TABS: 25.0000 mg | ORAL_TABLET | Freq: Two times a day (BID) | ORAL | Status: DC

## 2014-02-12 MED ORDER — FENOFIBRATE 160 MG PO TABS: ORAL_TABLET | ORAL | Status: DC

## 2014-03-09 ENCOUNTER — Encounter: Payer: Self-pay | Admitting: Cardiovascular Disease

## 2014-03-09 ENCOUNTER — Other Ambulatory Visit: Payer: Self-pay | Admitting: Cardiovascular Disease

## 2014-03-09 ENCOUNTER — Ambulatory Visit (INDEPENDENT_AMBULATORY_CARE_PROVIDER_SITE_OTHER): Payer: Medicare Other | Admitting: Cardiovascular Disease

## 2014-03-09 VITALS — BP 134/60 | HR 65 | Ht 67.0 in | Wt 148.4 lb

## 2014-03-09 DIAGNOSIS — I10 Essential (primary) hypertension: Secondary | ICD-10-CM

## 2014-03-09 DIAGNOSIS — E785 Hyperlipidemia, unspecified: Secondary | ICD-10-CM

## 2014-03-09 MED ORDER — FENOFIBRATE 160 MG PO TABS: ORAL_TABLET | ORAL | Status: DC

## 2014-03-09 NOTE — Patient Instructions (Signed)
Your physician recommends that you continue on your current medications as directed. Please refer to the Current Medication list given to you today.  Your physician wants you to follow-up in: 1 year with Dr. Nahser.  You will receive a reminder letter in the mail two months in advance. If you don't receive a letter, please call our office to schedule the follow-up appointment.  

## 2014-03-09 NOTE — Assessment & Plan Note (Signed)
Ms. Garno is doing great.  BP is well controlled.  Continue with her continue medications. I will see her in 1 year.

## 2014-03-09 NOTE — Progress Notes (Addendum)
Carla Foster Date of Birth  [birth year] [place] [address]    [place]   [place], [place]  [address]    [place], [place]  [address] [phone]  Fax  [phone]  [phone]  Fax [phone]  Problems: 1. Hypertension 2. Hyperlipidemia 3. Diabetes mellitus  History of Present Illness:  Carla Foster is a 73 year old female with a history of hypertension and hyperlipidemia. She also has a history of type 2 diabetes mellitus. She's been having problems with elevated blood pressure recently. I saw her in July for hypertension.  She feels fine. She's not had any episodes of chest pain or shortness breath. She continues to lose weight.  She has been told  to watch her salt intake. She used to use a lot of fried bologna.  January 17, 2013:  She is doing well.  No CP , no dyspnea.   Still eating salt.   March 09, 2014:  Ms. Carla Foster is doing well.  Walking regularly.  Hard of hearing - bought an expensive heaing aid that does not work well.   No CP or dyspnea.    Current Outpatient Prescriptions on File Prior to Visit  Medication Sig Dispense Refill  . amLODipine (NORVASC) 5 MG tablet Take 1 tablet (5 mg total) by mouth daily.  90 tablet  0  . aspirin 81 MG tablet Take 81 mg by mouth daily.        . Calcium Carbonate-Vitamin D (CALCIUM 600 + D PO) Take 1 tablet by mouth daily.        . carvedilol (COREG) 25 MG tablet Take 1 tablet (25 mg total) by mouth 2 (two) times daily.  90 tablet  0  . cholecalciferol (VITAMIN D) 1000 UNITS tablet Take 1,000 Units by mouth daily.        . fenofibrate 160 MG tablet TAKE 1 TABLET BY MOUTH EVERY DAY  90 tablet  0  . fish oil-omega-3 fatty acids 1000 MG capsule Take 1 g by mouth daily.        . Flaxseed, Linseed, (FLAX SEED OIL PO) Take 1 capsule by mouth daily.        Marland Kitchen lisinopril (PRINIVIL,ZESTRIL) 40 MG tablet TAKE 1 TABLET BY MOUTH EVERY DAY  90 tablet  0  . metFORMIN (GLUCOPHAGE) 1000 MG  tablet Take 1,000 mg by mouth daily.       . simvastatin (ZOCOR) 20 MG tablet Take 1 tablet (20 mg total) by mouth at bedtime.  90 tablet  3  . THYROID PO Take 1 tablet by mouth daily. ( OTC thyroid herb )       . vitamin C (ASCORBIC ACID) 500 MG tablet Take 500 mg by mouth daily.         No current facility-administered medications on file prior to visit.    Allergies  Allergen Reactions  . Tiazac [Diltiazem Hcl]     Ankle edema    Past Medical History  Diagnosis Date  . Hypertension   . Hyperlipidemia   . Diabetes mellitus     type 2  . Asthma     Past Surgical History  Procedure Laterality Date  . Partial hysterectomy    . Cervical discectomy  08/23/00    C5-6, C6-7 anterior cervical diskectomy with fibular bone bank fusion followed by Atlantis anterior cervical plating with the operating microscope  --  SURGEON:  Faythe Ghee, M.D.  History  Smoking status  . Former Smoker -- 1.00 packs/day for 18 years  . Types: Cigarettes  . Quit date: 01/13/1974  Smokeless tobacco  . Never Used    History  Alcohol Use No    Family History  Problem Relation Age of Onset  . Emphysema Father   . Stroke Mother   . Hypertension Mother   . Atrial fibrillation Mother     heart flutter    Reviw of Systems:  Reviewed in the HPI.  All other systems are negative.  Physical Exam: BP 134/60  Pulse 65  Ht 5\' 7"  (1.702 m)  Wt 148 lb 6.4 oz (67.314 kg)  BMI 23.24 kg/m2 The patient is alert and oriented x 3.  The mood and affect are normal.   Skin: warm and dry.  Color is normal.    HEENT:   Normocephalic/atraumatic. Her neck is supple. Mucous membranes are moist. The carotids are 2+ without bruits. Is no JVD.  Lungs: Lungs are clear.   Heart: Regular rate S1-S2.  Soft systolic murmur.  Abdomen: Good bowel sounds. There was no hepatosplenomegaly.  Extremities:  No clubbing cyanosis or edema  Neuro:  Exam is nonfocal.    ECG: 03/09/2014:  Normal sinus rhythm at  65.  Assessment / Plan:

## 2014-05-11 ENCOUNTER — Other Ambulatory Visit: Payer: Self-pay | Admitting: Cardiovascular Disease

## 2014-05-12 ENCOUNTER — Other Ambulatory Visit: Payer: Self-pay | Admitting: Cardiovascular Disease

## 2014-06-09 ENCOUNTER — Other Ambulatory Visit: Payer: Self-pay | Admitting: Cardiovascular Disease

## 2014-06-12 ENCOUNTER — Other Ambulatory Visit: Payer: Self-pay | Admitting: Cardiovascular Disease

## 2014-09-10 ENCOUNTER — Other Ambulatory Visit: Payer: Self-pay | Admitting: Cardiovascular Disease

## 2014-10-06 ENCOUNTER — Other Ambulatory Visit: Payer: Self-pay | Admitting: Cardiovascular Disease

## 2014-12-03 ENCOUNTER — Other Ambulatory Visit: Payer: Self-pay

## 2014-12-03 NOTE — Telephone Encounter (Signed)
Approved      Disp Refills Start End    carvedilol (COREG) 25 MG tablet 180 tablet 2 10/08/2014     Sig:  TAKE 1 TABLET BY MOUTH TWICE A DAY    Class:  Normal    DAW:  No    Authorizing Provider:  Thayer Headings, MD    Ordering User:  Juventino Slovak, CMA

## 2014-12-08 ENCOUNTER — Other Ambulatory Visit: Payer: Self-pay | Admitting: Cardiovascular Disease

## 2014-12-10 ENCOUNTER — Other Ambulatory Visit: Payer: Self-pay

## 2014-12-10 MED ORDER — CARVEDILOL 25 MG PO TABS: 25.0000 mg | ORAL_TABLET | Freq: Two times a day (BID) | ORAL | Status: DC

## 2015-03-18 ENCOUNTER — Ambulatory Visit (INDEPENDENT_AMBULATORY_CARE_PROVIDER_SITE_OTHER): Payer: Medicare Other | Admitting: Cardiovascular Disease

## 2015-03-18 ENCOUNTER — Encounter: Payer: Self-pay | Admitting: Cardiovascular Disease

## 2015-03-18 VITALS — BP 122/65 | HR 70 | Ht 66.0 in | Wt 149.8 lb

## 2015-03-18 DIAGNOSIS — I10 Essential (primary) hypertension: Secondary | ICD-10-CM

## 2015-03-18 DIAGNOSIS — E785 Hyperlipidemia, unspecified: Secondary | ICD-10-CM | POA: Diagnosis not present

## 2015-03-18 LAB — BASIC METABOLIC PANEL
BUN: 28 mg/dL — ABNORMAL HIGH (ref 6–23)
CHLORIDE: 102 meq/L (ref 96–112)
CO2: 28 meq/L (ref 19–32)
Calcium: 9.8 mg/dL (ref 8.4–10.5)
Creatinine, Ser: 1.06 mg/dL (ref 0.40–1.20)
GFR: 53.59 mL/min — ABNORMAL LOW (ref >60.00)
Glucose, Bld: 151 mg/dL — ABNORMAL HIGH (ref 70–99)
Potassium: 4.5 mEq/L (ref 3.5–5.1)
Sodium: 136 mEq/L (ref 135–145)

## 2015-03-18 LAB — HEPATIC FUNCTION PANEL
ALBUMIN: 3.9 g/dL (ref 3.5–5.2)
ALT: 11 U/L (ref 0–35)
AST: 23 U/L (ref 0–37)
Alkaline Phosphatase: 22 U/L — ABNORMAL LOW (ref 39–117)
Bilirubin, Direct: 0.1 mg/dL (ref 0.0–0.3)
TOTAL PROTEIN: 7.1 g/dL (ref 6.0–8.3)
Total Bilirubin: 0.3 mg/dL (ref 0.2–1.2)

## 2015-03-18 LAB — LIPID PANEL
Cholesterol: 161 mg/dL (ref 0–200)
HDL: 61.7 mg/dL (ref >39.00)
LDL CALC: 88 mg/dL (ref 0–99)
NONHDL: 99.51
TRIGLYCERIDES: 60 mg/dL (ref 0.0–149.0)
Total CHOL/HDL Ratio: 3
VLDL: 12 mg/dL (ref 0.0–40.0)

## 2015-03-18 NOTE — Patient Instructions (Signed)
Medication Instructions:  Your physician recommends that you continue on your current medications as directed. Please refer to the Current Medication list given to you today.  Labwork: LP/BMET/HFP  Testing/Procedures: NONE  Follow-Up: Your physician wants you to follow-up in: [place] (LP/BMET/HFP) You will receive a reminder letter in the mail two months in advance. If you don't receive a letter, please call our office to schedule the follow-up appointment.

## 2015-03-18 NOTE — Progress Notes (Signed)
Carla Foster Date of Birth  1940-11-10 [place] [address]    [place]   [place], [place]  [address]    [place], [place]  [address] [phone]  Fax  [phone]  [phone]  Fax [phone]  Problems: 1. Hypertension 2. Hyperlipidemia 3. Diabetes mellitus  History of Present Illness:  Carla Foster is a 74 year old female with a history of hypertension and hyperlipidemia. She also has a history of type 2 diabetes mellitus. She's been having problems with elevated blood pressure recently. I saw her in July for hypertension.  She feels fine. She's not had any episodes of chest pain or shortness breath. She continues to lose weight.  She has been told  to watch her salt intake. She used to use a lot of fried bologna.  January 17, 2013:  She is doing well.  No CP , no dyspnea.   Still eating salt.   March 09, 2014:  Carla Foster is doing well.  Walking regularly.  Hard of hearing - bought an expensive heaing aid that does not work well.   No CP or dyspnea.    Aug. 29, 2016:  Seen back today for HTN BP is a bit up today . Had a stomach virus yesterday - better today  Has not been eating any extra salt . No CP or dyspnea    Current Outpatient Prescriptions on File Prior to Visit  Medication Sig Dispense Refill  . amLODipine (NORVASC) 5 MG tablet Take 1 tablet (5 mg total) by mouth daily. 90 tablet 0  . aspirin 81 MG tablet Take 81 mg by mouth daily.      . Calcium Carbonate-Vitamin D (CALCIUM 600 + D PO) Take 1 tablet by mouth daily.      . carvedilol (COREG) 25 MG tablet Take 1 tablet (25 mg total) by mouth 2 (two) times daily. 90 tablet 1  . cholecalciferol (VITAMIN D) 1000 UNITS tablet Take 1,000 Units by mouth daily.      . fenofibrate 160 MG tablet TAKE 1 TABLET BY MOUTH EVERY DAY 90 tablet 1  . fish oil-omega-3 fatty acids 1000 MG capsule Take 1 g by mouth daily.      . Flaxseed, Linseed, (FLAX SEED OIL PO) Take 1  capsule by mouth daily.      Marland Kitchen lisinopril (PRINIVIL,ZESTRIL) 40 MG tablet TAKE 1 TABLET BY MOUTH EVERY DAY 90 tablet 3  . metFORMIN (GLUCOPHAGE) 1000 MG tablet Take 1,000 mg by mouth daily.     . simvastatin (ZOCOR) 20 MG tablet Take 1 tablet (20 mg total) by mouth at bedtime. 90 tablet 3  . THYROID PO Take 1 tablet by mouth daily. ( OTC thyroid herb )     . vitamin C (ASCORBIC ACID) 500 MG tablet Take 500 mg by mouth daily.       No current facility-administered medications on file prior to visit.    Allergies  Allergen Reactions  . Tiazac [Diltiazem Hcl]     Ankle edema    Past Medical History  Diagnosis Date  . Hypertension   . Hyperlipidemia   . Diabetes mellitus     type 2  . Asthma     Past Surgical History  Procedure Laterality Date  . Partial hysterectomy    . Cervical discectomy  08/23/00    C5-6, C6-7 anterior cervical diskectomy with fibular bone bank fusion followed by Atlantis anterior cervical plating  with the operating microscope  --  SURGEON:  Faythe Ghee, M.D.    History  Smoking status  . Former Smoker -- 1.00 packs/day for 18 years  . Types: Cigarettes  . Quit date: 01/13/1974  Smokeless tobacco  . Never Used    History  Alcohol Use No    Family History  Problem Relation Age of Onset  . Emphysema Father   . Stroke Mother   . Hypertension Mother   . Atrial fibrillation Mother     heart flutter    Reviw of Systems:  Reviewed in the HPI.  All other systems are negative.  Physical Exam: BP 170/70 mmHg  Pulse 70  Ht 5\' 6"  (1.676 m)  Wt 67.949 kg (149 lb 12.8 oz)  BMI 24.19 kg/m2 The patient is alert and oriented x 3.  The mood and affect are normal.   Skin: warm and dry.  Color is normal.    HEENT:   Normocephalic/atraumatic. Her neck is supple. Mucous membranes are moist. The carotids are 2+ without bruits. Is no JVD.  Lungs: Lungs are clear.   Heart: Regular rate S1-S2.  Soft systolic murmur.  Abdomen: Good bowel sounds.  There was no hepatosplenomegaly.  Extremities:  No clubbing cyanosis or edema  Neuro:  Exam is nonfocal.    ECG: 03/18/2015: Normal sinus rhythm at 70. She has no ST or T wave changes.  Assessment / Plan:    1. Hypertension- her blood pressure was better when we rechecked it. Continue same medications.  2. Hyperlipidemia- we'll check fasting labs today. Continue current medications.  3. Diabetes mellitus    Nahser, Wonda Cheng, MD  03/18/2015 9:13 AM    [facility],  [place], [place]  [address] Pager [phone] Phone: [phone]; Fax: [phone]   [facility]  [address] [place], [place]  [address] [phone]   Fax [phone]

## 2015-03-19 ENCOUNTER — Telehealth: Payer: Self-pay | Admitting: Cardiovascular Disease

## 2015-03-19 ENCOUNTER — Other Ambulatory Visit: Payer: Self-pay | Admitting: Cardiovascular Disease

## 2015-03-19 NOTE — Telephone Encounter (Signed)
Reviewed lab results with patient who verbalized understanding 

## 2015-03-19 NOTE — Telephone Encounter (Signed)
New message   Pt calling to get results on Lab

## 2015-08-25 ENCOUNTER — Other Ambulatory Visit: Payer: Self-pay | Admitting: Cardiovascular Disease

## 2015-08-27 DIAGNOSIS — I272 Pulmonary hypertension, unspecified: Secondary | ICD-10-CM | POA: Insufficient documentation

## 2015-08-27 DIAGNOSIS — H9190 Unspecified hearing loss, unspecified ear: Secondary | ICD-10-CM | POA: Insufficient documentation

## 2015-08-27 DIAGNOSIS — J45909 Unspecified asthma, uncomplicated: Secondary | ICD-10-CM | POA: Insufficient documentation

## 2015-11-13 HISTORY — PX: CATARACT EXTRACTION W/ INTRAOCULAR LENS IMPLANT: SHX1309

## 2015-12-31 ENCOUNTER — Other Ambulatory Visit: Payer: Self-pay | Admitting: Cardiovascular Disease

## 2016-03-17 ENCOUNTER — Encounter: Payer: Self-pay | Admitting: Cardiovascular Disease

## 2016-03-17 ENCOUNTER — Ambulatory Visit (INDEPENDENT_AMBULATORY_CARE_PROVIDER_SITE_OTHER): Payer: Medicare Other | Admitting: Cardiovascular Disease

## 2016-03-17 VITALS — BP 110/60 | HR 72 | Ht 66.0 in | Wt 158.4 lb

## 2016-03-17 DIAGNOSIS — I10 Essential (primary) hypertension: Secondary | ICD-10-CM | POA: Diagnosis not present

## 2016-03-17 DIAGNOSIS — E785 Hyperlipidemia, unspecified: Secondary | ICD-10-CM | POA: Diagnosis not present

## 2016-03-17 LAB — LIPID PANEL
CHOL/HDL RATIO: 2 ratio (ref <=5.0)
CHOLESTEROL: 133 mg/dL (ref 125–200)
HDL: 67 mg/dL (ref >=46)
LDL CALC: 48 mg/dL (ref <130)
Triglycerides: 89 mg/dL (ref <150)
VLDL: 18 mg/dL (ref <30)

## 2016-03-17 LAB — COMPREHENSIVE METABOLIC PANEL
ALT: 8 U/L (ref 6–29)
AST: 16 U/L (ref 10–35)
Albumin: 3.6 g/dL (ref 3.6–5.1)
Alkaline Phosphatase: 21 U/L — ABNORMAL LOW (ref 33–130)
BUN: 24 mg/dL (ref 7–25)
CHLORIDE: 104 mmol/L (ref 98–110)
CO2: 27 mmol/L (ref 20–31)
Calcium: 9.3 mg/dL (ref 8.6–10.4)
Creat: 1.3 mg/dL — ABNORMAL HIGH (ref 0.60–0.93)
GLUCOSE: 60 mg/dL — AB (ref 65–99)
POTASSIUM: 4.8 mmol/L (ref 3.5–5.3)
Sodium: 138 mmol/L (ref 135–146)
Total Bilirubin: 0.3 mg/dL (ref 0.2–1.2)
Total Protein: 6.2 g/dL (ref 6.1–8.1)

## 2016-03-17 NOTE — Patient Instructions (Signed)
Medication Instructions:  Your physician recommends that you continue on your current medications as directed. Please refer to the Current Medication list given to you today.   Labwork: TODAY - cholesterol, complete metabolic panel   Testing/Procedures: None Ordered   Follow-Up: Your physician wants you to follow-up in: 1 year with Dr. Nahser.  You will receive a reminder letter in the mail two months in advance. If you don't receive a letter, please call our office to schedule the follow-up appointment.   If you need a refill on your cardiac medications before your next appointment, please call your pharmacy.   Thank you for choosing [facility]! Marwin Primmer, RN [phone]    

## 2016-03-17 NOTE — Progress Notes (Signed)
Carla Foster Date of Birth  04/30/1941 [place] [address]    [place]   [place], [place]  [address]    [place], [place]  [address] [phone]  Fax  [phone]  [phone]  Fax [phone]  Problems: 1. Hypertension 2. Hyperlipidemia 3. Diabetes mellitus  History of Present Illness:  Carla Foster is a 75 year old female with a history of hypertension and hyperlipidemia. She also has a history of type 2 diabetes mellitus. She's been having problems with elevated blood pressure recently. I saw her in July for hypertension.  She feels fine. She's not had any episodes of chest pain or shortness breath. She continues to lose weight.  She has been told  to watch her salt intake. She used to use a lot of fried bologna.  January 17, 2013:  She is doing well.  No CP , no dyspnea.   Still eating salt.   March 09, 2014:  Carla Foster is doing well.  Walking regularly.  Hard of hearing - bought an expensive heaing aid that does not work well.   No CP or dyspnea.    Aug. 29, 2016:  Seen back today for HTN BP is a bit up today . Had a stomach virus yesterday - better today  Has not been eating any extra salt . No CP or dyspnea  Aug. 29, 2017:  Doing well.   Had some cataract surgery recently .  Stays active.   Mows with a push mower.  Tries to watch her salt     Current Outpatient Prescriptions on File Prior to Visit  Medication Sig Dispense Refill  . amLODipine (NORVASC) 5 MG tablet Take 1 tablet (5 mg total) by mouth daily. 90 tablet 0  . aspirin 81 MG tablet Take 81 mg by mouth daily.      . Calcium Carbonate-Vitamin D (CALCIUM 600 + D PO) Take 1 tablet by mouth daily.      . carvedilol (COREG) 25 MG tablet TAKE 1 TABLET BY MOUTH TWICE A DAY 90 tablet 3  . cholecalciferol (VITAMIN D) 1000 UNITS tablet Take 1,000 Units by mouth daily.      . fenofibrate 160 MG tablet TAKE 1 TABLET BY MOUTH EVERY DAY 90 tablet 1  . fish  oil-omega-3 fatty acids 1000 MG capsule Take 1 g by mouth daily.      . Flaxseed, Linseed, (FLAX SEED OIL PO) Take 1 capsule by mouth daily.      Marland Kitchen lisinopril (PRINIVIL,ZESTRIL) 40 MG tablet TAKE 1 TABLET BY MOUTH EVERY DAY 90 tablet 3  . metFORMIN (GLUCOPHAGE) 1000 MG tablet Take 1,000 mg by mouth daily.     . simvastatin (ZOCOR) 20 MG tablet Take 1 tablet (20 mg total) by mouth at bedtime. 90 tablet 3  . vitamin C (ASCORBIC ACID) 500 MG tablet Take 500 mg by mouth daily.       No current facility-administered medications on file prior to visit.     Allergies  Allergen Reactions  . Tiazac [Diltiazem Hcl]     Ankle edema    Past Medical History:  Diagnosis Date  . Asthma   . Diabetes mellitus    type 2  . Hyperlipidemia   . Hypertension     Past Surgical History:  Procedure Laterality Date  . CERVICAL DISCECTOMY  08/23/00   C5-6, C6-7 anterior cervical diskectomy with fibular bone bank fusion followed by Atlantis  anterior cervical plating with the operating microscope  --  SURGEON:  Faythe Ghee, M.D.  . PARTIAL HYSTERECTOMY      History  Smoking Status  . Former Smoker  . Packs/day: 1.00  . Years: 18.00  . Types: Cigarettes  . Quit date: 01/13/1974  Smokeless Tobacco  . Never Used    History  Alcohol Use No    Family History  Problem Relation Age of Onset  . Emphysema Father   . Stroke Mother   . Hypertension Mother   . Atrial fibrillation Mother     heart flutter    Reviw of Systems:  Reviewed in the HPI.  All other systems are negative.  Physical Exam: BP 110/60   Pulse 72   Ht 5\' 6"  (1.676 m)   Wt 158 lb 6.4 oz (71.8 kg)   BMI 25.57 kg/m  The patient is alert and oriented x 3.  The mood and affect are normal.   Skin: warm and dry.  Color is normal.    HEENT:   Normocephalic/atraumatic. Her neck is supple. Mucous membranes are moist. The carotids are 2+ without bruits. Is no JVD.  Lungs: Lungs are clear.   Heart: Regular rate S1-S2.  Soft  systolic murmur.  Abdomen: Good bowel sounds. There was no hepatosplenomegaly.  Extremities:  No clubbing cyanosis or edema  Neuro:  Exam is nonfocal.    ECG: 03/17/16:   NSR at 72.  Normal ecg   Assessment / Plan:    1. Hypertension- her blood pressure was better when we rechecked it. Continue same medications.  2. Hyperlipidemia- we'll check fasting labs today. Continue current medications.  3. Diabetes mellitus    Mertie Moores, MD  03/17/2016 9:30 AM    [facility],  [place], [place]  [address] Pager [phone] Phone: [phone]; Fax: [phone]   [facility]  [address] [place], [place]  [address] [phone]   Fax [phone]

## 2016-06-23 ENCOUNTER — Other Ambulatory Visit: Payer: Self-pay | Admitting: Cardiovascular Disease

## 2017-04-23 ENCOUNTER — Other Ambulatory Visit: Payer: Self-pay | Admitting: Cardiovascular Disease

## 2017-06-16 ENCOUNTER — Encounter: Payer: Self-pay | Admitting: Cardiovascular Disease

## 2017-06-24 ENCOUNTER — Encounter (INDEPENDENT_AMBULATORY_CARE_PROVIDER_SITE_OTHER): Payer: Self-pay

## 2017-06-24 ENCOUNTER — Ambulatory Visit: Payer: Medicare Other | Admitting: Cardiovascular Disease

## 2017-06-24 ENCOUNTER — Encounter: Payer: Self-pay | Admitting: Cardiovascular Disease

## 2017-06-24 VITALS — BP 170/82 | HR 73 | Ht 66.0 in | Wt 154.4 lb

## 2017-06-24 DIAGNOSIS — E782 Mixed hyperlipidemia: Secondary | ICD-10-CM | POA: Diagnosis not present

## 2017-06-24 DIAGNOSIS — I10 Essential (primary) hypertension: Secondary | ICD-10-CM | POA: Diagnosis not present

## 2017-06-24 MED ORDER — DOXAZOSIN MESYLATE 2 MG PO TABS: 2.0000 mg | ORAL_TABLET | Freq: Every day | ORAL | 3 refills | Status: DC

## 2017-06-24 NOTE — Patient Instructions (Addendum)
Medication Instructions:  Your physician has recommended you make the following change in your medication:  START Doxazosin (Cardura) 2 mg at bedtime   Labwork: Your physician recommends that you return for lab work in: 6 months on the day of or a few days before your office visit with Dr. Acie Fredrickson.  You will need to FAST for this appointment - nothing to eat or drink after midnight the night before except water.    Testing/Procedures: None Ordered   Follow-Up: Your physician wants you to follow-up in: 6 months with Dr. Acie Fredrickson.  You will receive a reminder letter in the mail two months in advance. If you don't receive a letter, please call our office to schedule the follow-up appointment.   If you need a refill on your cardiac medications before your next appointment, please call your pharmacy.   Thank you for choosing [facility]! Christen Bame, RN [phone]

## 2017-06-24 NOTE — Progress Notes (Signed)
Annitta Jersey Tolleson Date of Birth  January 18, 1941 [place] [address]    [place]   [place], [place]  [address]    [place], [place]  [address] [phone]  Fax  [phone]  [phone]  Fax [phone]  Problems: 1. Hypertension 2. Hyperlipidemia 3. Diabetes mellitus  History of Present Illness:  [name] is a 76 year old female with a history of hypertension and hyperlipidemia. She also has a history of type 2 diabetes mellitus. She's been having problems with elevated blood pressure recently. I saw her in July for hypertension.  She feels fine. She's not had any episodes of chest pain or shortness breath. She continues to lose weight.  She has been told  to watch her salt intake. She used to use a lot of fried bologna.  January 17, 2013:  She is doing well.  No CP , no dyspnea.   Still eating salt.   March 09, 2014:  Ms. Navratil is doing well.  Walking regularly.  Hard of hearing - bought an expensive heaing aid that does not work well.   No CP or dyspnea.    Aug. 29, 2016:  Seen back today for HTN BP is a bit up today . Had a stomach virus yesterday - better today  Has not been eating any extra salt . No CP or dyspnea  Aug. 29, 2017:  Doing well.   Had some cataract surgery recently .  Stays active.   Mows with a push mower.  Tries to watch her salt   Dec.  6, 2018:     Seen for follow up of her HTN Has had lots of dental work  Does not avoid salty foods  - loves country ham - eats once a week,  Eats salty foods regularly , does not cook for herself     Current Outpatient Medications on File Prior to Visit  Medication Sig Dispense Refill  . albuterol (PROVENTIL HFA;VENTOLIN HFA) 108 (90 Base) MCG/ACT inhaler Inhale 2 puffs into the lungs as directed.    Marland Kitchen amLODipine (NORVASC) 5 MG tablet Take 1 tablet (5 mg total) by mouth daily. 90 tablet 0  . aspirin 81 MG tablet Take 81 mg by mouth daily.      . Calcium  Carbonate-Vitamin D (CALCIUM 600 + D PO) Take 1 tablet by mouth daily.      . carvedilol (COREG) 25 MG tablet TAKE 1 TABLET BY MOUTH TWICE A DAY 180 tablet 0  . cholecalciferol (VITAMIN D) 1000 UNITS tablet Take 1,000 Units by mouth daily.      . fenofibrate 160 MG tablet TAKE 1 TABLET BY MOUTH EVERY DAY 90 tablet 1  . fish oil-omega-3 fatty acids 1000 MG capsule Take 1 g by mouth daily.      . Flaxseed, Linseed, (FLAX SEED OIL PO) Take 1 capsule by mouth daily.      . hydrochlorothiazide (HYDRODIURIL) 25 MG tablet Take 25 mg by mouth as needed.    Marland Kitchen ibuprofen (ADVIL,MOTRIN) 600 MG tablet Take 600 mg by mouth every 6 (six) hours as needed.  0  . lisinopril (PRINIVIL,ZESTRIL) 40 MG tablet TAKE 1 TABLET BY MOUTH EVERY DAY 90 tablet 3  . metFORMIN (GLUCOPHAGE) 1000 MG tablet Take 1,000 mg by mouth daily.     . simvastatin (ZOCOR) 20 MG tablet Take 1 tablet (20 mg total) by mouth at bedtime. 90 tablet 3  . vitamin  A 8000 UNIT capsule Take 1 capsule by mouth daily.    . vitamin C (ASCORBIC ACID) 500 MG tablet Take 500 mg by mouth daily.       No current facility-administered medications on file prior to visit.     Allergies  Allergen Reactions  . Tiazac [Diltiazem Hcl]     Ankle edema    Past Medical History:  Diagnosis Date  . Asthma   . Diabetes mellitus    type 2  . Hyperlipidemia   . Hypertension     Past Surgical History:  Procedure Laterality Date  . CERVICAL DISCECTOMY  08/23/00   C5-6, C6-7 anterior cervical diskectomy with fibular bone bank fusion followed by Atlantis anterior cervical plating with the operating microscope  --  SURGEON:  Faythe Ghee, M.D.  . PARTIAL HYSTERECTOMY      Social History   Tobacco Use  Smoking Status Former Smoker  . Packs/day: 1.00  . Years: 18.00  . Pack years: 18.00  . Types: Cigarettes  . Last attempt to quit: 01/13/1974  . Years since quitting: 43.4  Smokeless Tobacco Never Used    Social History   Substance and Sexual  Activity  Alcohol Use No    Family History  Problem Relation Age of Onset  . Emphysema Father   . Stroke Mother   . Hypertension Mother   . Atrial fibrillation Mother        heart flutter    Reviw of Systems:  Reviewed in the HPI.  All other systems are negative.  Physical Exam: Blood pressure (!) 170/82, pulse 73, height 5\' 6"  (1.676 m), weight 154 lb 6.4 oz (70 kg), SpO2 98 %.  GEN:  Well nourished, well developed in no acute distress HEENT: Normal NECK: No JVD; No carotid bruits LYMPHATICS: No lymphadenopathy CARDIAC: RRR , no murmurs, rubs, gallops RESPIRATORY:  Clear to auscultation without rales, wheezing or rhonchi  ABDOMEN: Soft, non-tender, non-distended MUSCULOSKELETAL:  No edema; No deformity  SKIN: Warm and dry NEUROLOGIC:  Alert and oriented x 3   ECG: June 24, 2017: Normal sinus rhythm at 73.  EKG is normal.  Assessment / Plan:    1. Hypertension-   blood pressure continues to be elevated.  She admits to eating lots of salty foods.  We will add Cardura 2 mg at night.  We will see her again in 6 months.  Check BMP in 6 months   2. Hyperlipidemia-   Will check fasting lipids liver enz  in 6 months   3. Diabetes mellitus    Mertie Moores, MD  06/24/2017 10:12 AM    [address],  [place], [place]  [address] Pager [phone] Phone: [phone]; Fax: [phone]

## 2017-07-19 ENCOUNTER — Other Ambulatory Visit: Payer: Self-pay | Admitting: Cardiovascular Disease

## 2017-07-19 MED ORDER — CARVEDILOL 25 MG PO TABS: 25.0000 mg | ORAL_TABLET | Freq: Two times a day (BID) | ORAL | 3 refills | Status: DC

## 2017-11-20 ENCOUNTER — Encounter (HOSPITAL_COMMUNITY): Payer: Self-pay | Admitting: Emergency Medicine

## 2017-11-20 ENCOUNTER — Inpatient Hospital Stay (HOSPITAL_COMMUNITY)
Admission: EM | Admit: 2017-11-20 | Discharge: 2017-11-23 | DRG: 378 | Disposition: A | Discharge status: Home / Self Care | Payer: Medicare Other | Attending: Internal Medicine | Admitting: Internal Medicine

## 2017-11-20 DIAGNOSIS — K920 Hematemesis: Secondary | ICD-10-CM | POA: Diagnosis present

## 2017-11-20 DIAGNOSIS — K219 Gastro-esophageal reflux disease without esophagitis: Secondary | ICD-10-CM | POA: Diagnosis present

## 2017-11-20 DIAGNOSIS — Z90711 Acquired absence of uterus with remaining cervical stump: Secondary | ICD-10-CM | POA: Diagnosis not present

## 2017-11-20 DIAGNOSIS — K319 Disease of stomach and duodenum, unspecified: Secondary | ICD-10-CM | POA: Diagnosis present

## 2017-11-20 DIAGNOSIS — N179 Acute kidney failure, unspecified: Secondary | ICD-10-CM | POA: Diagnosis present

## 2017-11-20 DIAGNOSIS — K921 Melena: Secondary | ICD-10-CM | POA: Diagnosis not present

## 2017-11-20 DIAGNOSIS — D649 Anemia, unspecified: Secondary | ICD-10-CM | POA: Diagnosis not present

## 2017-11-20 DIAGNOSIS — D62 Acute posthemorrhagic anemia: Secondary | ICD-10-CM | POA: Diagnosis present

## 2017-11-20 DIAGNOSIS — Z7984 Long term (current) use of oral hypoglycemic drugs: Secondary | ICD-10-CM | POA: Diagnosis not present

## 2017-11-20 DIAGNOSIS — Z86010 Personal history of colonic polyps: Secondary | ICD-10-CM | POA: Diagnosis not present

## 2017-11-20 DIAGNOSIS — T39395A Adverse effect of other nonsteroidal anti-inflammatory drugs [NSAID], initial encounter: Secondary | ICD-10-CM | POA: Diagnosis present

## 2017-11-20 DIAGNOSIS — E119 Type 2 diabetes mellitus without complications: Secondary | ICD-10-CM | POA: Diagnosis present

## 2017-11-20 DIAGNOSIS — Z7982 Long term (current) use of aspirin: Secondary | ICD-10-CM | POA: Diagnosis not present

## 2017-11-20 DIAGNOSIS — Z8249 Family history of ischemic heart disease and other diseases of the circulatory system: Secondary | ICD-10-CM | POA: Diagnosis not present

## 2017-11-20 DIAGNOSIS — E785 Hyperlipidemia, unspecified: Secondary | ICD-10-CM | POA: Diagnosis present

## 2017-11-20 DIAGNOSIS — K254 Chronic or unspecified gastric ulcer with hemorrhage: Principal | ICD-10-CM | POA: Diagnosis present

## 2017-11-20 DIAGNOSIS — K3189 Other diseases of stomach and duodenum: Secondary | ICD-10-CM | POA: Diagnosis not present

## 2017-11-20 DIAGNOSIS — E861 Hypovolemia: Secondary | ICD-10-CM | POA: Diagnosis present

## 2017-11-20 DIAGNOSIS — K253 Acute gastric ulcer without hemorrhage or perforation: Secondary | ICD-10-CM

## 2017-11-20 DIAGNOSIS — E871 Hypo-osmolality and hyponatremia: Secondary | ICD-10-CM | POA: Diagnosis present

## 2017-11-20 DIAGNOSIS — Z87891 Personal history of nicotine dependence: Secondary | ICD-10-CM

## 2017-11-20 DIAGNOSIS — I959 Hypotension, unspecified: Secondary | ICD-10-CM | POA: Diagnosis present

## 2017-11-20 DIAGNOSIS — I10 Essential (primary) hypertension: Secondary | ICD-10-CM | POA: Diagnosis present

## 2017-11-20 DIAGNOSIS — J45909 Unspecified asthma, uncomplicated: Secondary | ICD-10-CM | POA: Diagnosis present

## 2017-11-20 DIAGNOSIS — Z888 Allergy status to other drugs, medicaments and biological substances status: Secondary | ICD-10-CM | POA: Diagnosis not present

## 2017-11-20 DIAGNOSIS — K922 Gastrointestinal hemorrhage, unspecified: Secondary | ICD-10-CM | POA: Diagnosis present

## 2017-11-20 DIAGNOSIS — E1169 Type 2 diabetes mellitus with other specified complication: Secondary | ICD-10-CM | POA: Diagnosis present

## 2017-11-20 HISTORY — DX: Hematemesis: K92.0

## 2017-11-20 HISTORY — DX: Other complications of anesthesia, initial encounter: T88.59XA

## 2017-11-20 HISTORY — DX: Adverse effect of unspecified anesthetic, initial encounter: T41.45XA

## 2017-11-20 HISTORY — DX: Melena: K92.1

## 2017-11-20 HISTORY — DX: Acute posthemorrhagic anemia: D62

## 2017-11-20 HISTORY — DX: Acute kidney failure, unspecified: N17.9

## 2017-11-20 HISTORY — DX: Gastrointestinal hemorrhage, unspecified: K92.2

## 2017-11-20 LAB — COMPREHENSIVE METABOLIC PANEL
ALK PHOS: 17 U/L — AB (ref 38–126)
ALT: 15 U/L (ref 14–54)
ANION GAP: 9 (ref 5–15)
AST: 22 U/L (ref 15–41)
Albumin: 2.9 g/dL — ABNORMAL LOW (ref 3.5–5.0)
BILIRUBIN TOTAL: 0.5 mg/dL (ref 0.3–1.2)
BUN: 70 mg/dL — ABNORMAL HIGH (ref 6–20)
CALCIUM: 8.7 mg/dL — AB (ref 8.9–10.3)
CO2: 23 mmol/L (ref 22–32)
Chloride: 94 mmol/L — ABNORMAL LOW (ref 101–111)
Creatinine, Ser: 1.26 mg/dL — ABNORMAL HIGH (ref 0.44–1.00)
GFR calc non Af Amer: 40 mL/min — ABNORMAL LOW (ref >60)
GFR, EST AFRICAN AMERICAN: 46 mL/min — AB (ref >60)
Glucose, Bld: 233 mg/dL — ABNORMAL HIGH (ref 65–99)
POTASSIUM: 4.7 mmol/L (ref 3.5–5.1)
SODIUM: 126 mmol/L — AB (ref 135–145)
TOTAL PROTEIN: 5.6 g/dL — AB (ref 6.5–8.1)

## 2017-11-20 LAB — CBC
HCT: 27 % — ABNORMAL LOW (ref 36.0–46.0)
Hemoglobin: 9.1 g/dL — ABNORMAL LOW (ref 12.0–15.0)
MCH: 27.6 pg (ref 26.0–34.0)
MCHC: 33.7 g/dL (ref 30.0–36.0)
MCV: 81.8 fL (ref 78.0–100.0)
PLATELETS: 273 10*3/uL (ref 150–400)
RBC: 3.3 MIL/uL — AB (ref 3.87–5.11)
RDW: 13.3 % (ref 11.5–15.5)
WBC: 10.3 10*3/uL (ref 4.0–10.5)

## 2017-11-20 LAB — CBC WITH DIFFERENTIAL/PLATELET
BASOS ABS: 0 10*3/uL (ref 0.0–0.1)
BASOS PCT: 0 %
Eosinophils Absolute: 0 10*3/uL (ref 0.0–0.7)
Eosinophils Relative: 0 %
HEMATOCRIT: 19.3 % — AB (ref 36.0–46.0)
HEMOGLOBIN: 6.4 g/dL — AB (ref 12.0–15.0)
Lymphocytes Relative: 11 %
Lymphs Abs: 1.1 10*3/uL (ref 0.7–4.0)
MCH: 27.1 pg (ref 26.0–34.0)
MCHC: 33.2 g/dL (ref 30.0–36.0)
MCV: 81.8 fL (ref 78.0–100.0)
Monocytes Absolute: 0.3 10*3/uL (ref 0.1–1.0)
Monocytes Relative: 3 %
NEUTROS PCT: 86 %
Neutro Abs: 8.4 10*3/uL — ABNORMAL HIGH (ref 1.7–7.7)
PLATELETS: 316 10*3/uL (ref 150–400)
RBC: 2.36 MIL/uL — ABNORMAL LOW (ref 3.87–5.11)
RDW: 13.8 % (ref 11.5–15.5)
WBC: 9.8 10*3/uL (ref 4.0–10.5)

## 2017-11-20 LAB — PROTIME-INR
INR: 1.17
PROTHROMBIN TIME: 14.8 s (ref 11.4–15.2)

## 2017-11-20 LAB — HEMOGLOBIN A1C
Hgb A1c MFr Bld: 6.8 % — ABNORMAL HIGH (ref 4.8–5.6)
Mean Plasma Glucose: 148.46 mg/dL

## 2017-11-20 LAB — PREPARE RBC (CROSSMATCH)

## 2017-11-20 LAB — GLUCOSE, CAPILLARY
Glucose-Capillary: 153 mg/dL — ABNORMAL HIGH (ref 65–99)
Glucose-Capillary: 113 mg/dL — ABNORMAL HIGH (ref 65–99)

## 2017-11-20 LAB — POC OCCULT BLOOD, ED: Fecal Occult Bld: POSITIVE — AB

## 2017-11-20 LAB — ABO/RH: ABO/RH(D): B NEG

## 2017-11-20 MED ORDER — ONDANSETRON HCL 4 MG PO TABS: 4.0000 mg | ORAL_TABLET | Freq: Four times a day (QID) | ORAL | Status: DC | PRN

## 2017-11-20 MED ORDER — PANTOPRAZOLE SODIUM 40 MG IV SOLR
40.0000 mg | Freq: Once | INTRAVENOUS | Status: AC
  Administered 2017-11-20: 40 mg via INTRAVENOUS
  Filled 2017-11-20: qty 40

## 2017-11-20 MED ORDER — SODIUM CHLORIDE 0.9 % IV SOLN
INTRAVENOUS | Status: AC
  Administered 2017-11-20: 20:00:00 via INTRAVENOUS

## 2017-11-20 MED ORDER — ALBUTEROL SULFATE (2.5 MG/3ML) 0.083% IN NEBU: 2.5000 mg | INHALATION_SOLUTION | Freq: Four times a day (QID) | RESPIRATORY_TRACT | Status: DC | PRN

## 2017-11-20 MED ORDER — ACETAMINOPHEN 325 MG PO TABS
650.0000 mg | ORAL_TABLET | Freq: Four times a day (QID) | ORAL | Status: DC | PRN
  Administered 2017-11-21: 650 mg via ORAL
  Filled 2017-11-20: qty 2

## 2017-11-20 MED ORDER — CARVEDILOL 25 MG PO TABS
25.0000 mg | ORAL_TABLET | Freq: Two times a day (BID) | ORAL | Status: DC
  Administered 2017-11-21 – 2017-11-23 (×5): 25 mg via ORAL
  Filled 2017-11-20 (×5): qty 1

## 2017-11-20 MED ORDER — SODIUM CHLORIDE 0.9 % IV BOLUS
1000.0000 mL | Freq: Once | INTRAVENOUS | Status: AC
  Administered 2017-11-20: 1000 mL via INTRAVENOUS

## 2017-11-20 MED ORDER — ALBUTEROL SULFATE HFA 108 (90 BASE) MCG/ACT IN AERS: 2.0000 | INHALATION_SPRAY | RESPIRATORY_TRACT | Status: DC

## 2017-11-20 MED ORDER — TRAZODONE HCL 50 MG PO TABS
25.0000 mg | ORAL_TABLET | Freq: Every evening | ORAL | Status: DC | PRN
  Administered 2017-11-20 – 2017-11-22 (×2): 25 mg via ORAL
  Filled 2017-11-20 (×2): qty 1

## 2017-11-20 MED ORDER — ACETAMINOPHEN 650 MG RE SUPP: 650.0000 mg | Freq: Four times a day (QID) | RECTAL | Status: DC | PRN

## 2017-11-20 MED ORDER — INSULIN ASPART 100 UNIT/ML ~~LOC~~ SOLN
0.0000 [IU] | Freq: Three times a day (TID) | SUBCUTANEOUS | Status: DC
  Administered 2017-11-20: 3 [IU] via SUBCUTANEOUS
  Administered 2017-11-21 (×2): 2 [IU] via SUBCUTANEOUS
  Administered 2017-11-22 (×2): 3 [IU] via SUBCUTANEOUS
  Administered 2017-11-23: 2 [IU] via SUBCUTANEOUS

## 2017-11-20 MED ORDER — ONDANSETRON HCL 4 MG/2ML IJ SOLN
4.0000 mg | Freq: Once | INTRAMUSCULAR | Status: AC
  Administered 2017-11-20: 4 mg via INTRAVENOUS
  Filled 2017-11-20: qty 2

## 2017-11-20 MED ORDER — SODIUM CHLORIDE 0.9 % IV SOLN
80.0000 mg | Freq: Once | INTRAVENOUS | Status: DC
  Filled 2017-11-20: qty 80

## 2017-11-20 MED ORDER — INSULIN ASPART 100 UNIT/ML ~~LOC~~ SOLN: 0.0000 [IU] | Freq: Every day | SUBCUTANEOUS | Status: DC

## 2017-11-20 MED ORDER — SODIUM CHLORIDE 0.9 % IV SOLN
10.0000 mL/h | Freq: Once | INTRAVENOUS | Status: AC
  Administered 2017-11-20: 10 mL/h via INTRAVENOUS

## 2017-11-20 MED ORDER — ONDANSETRON HCL 4 MG/2ML IJ SOLN: 4.0000 mg | Freq: Four times a day (QID) | INTRAMUSCULAR | Status: DC | PRN

## 2017-11-20 MED ORDER — SODIUM CHLORIDE 0.9 % IV SOLN
8.0000 mg/h | INTRAVENOUS | Status: DC
  Administered 2017-11-20 – 2017-11-21 (×2): 8 mg/h via INTRAVENOUS
  Filled 2017-11-20 (×6): qty 80

## 2017-11-20 NOTE — ED Triage Notes (Signed)
Per EMS:  Pt presents to ED for assessment of bright red blood emesis, and black and dark tarry stools x 5 this morning.  Orthostatic changes noted by EMS.

## 2017-11-20 NOTE — ED Notes (Signed)
Attempted report x1. 

## 2017-11-20 NOTE — H&P (View-Only) (Signed)
Referring Provider: ER Primary Care Physician:  Christain Sacramento, MD Primary Gastroenterologist:  Dr. Collene Mares   Reason for Consultation:  GI bleed  HPI: Carla Foster is a 77 y.o. female with past medical history of hypertension, hyperlipidemia and diabetes presented to the hospital with dark stool and 1 episode of vomiting of blood.she was found to have a hemoglobin of 6.4. GI is consulted for further evaluation.  Patient seen and examined at bedside.complaining of intermittent fatigue and shortness of breath with exertion for last 1 month.patient started noticing dark-colored stool yesterday.patient woke up this morning with stomach upset followed by 1 episode of vomiting of blood. Patient had a few more episodes of black colored stool today.complaining of acid reflux. Denies any dysphagia or odynophagia. Denied any bright red blood per rectum. Takes Advil on a daily basis for generalized body ache.  Personal history of colon polyps. colonoscopy in the past by Dr. Collene Mares.  Past Medical History:  Diagnosis Date  . Acute blood loss anemia   . AKI (acute kidney injury) ([facility])   . Asthma   . Diabetes mellitus    type 2  . GI bleed   . Hematemesis   . Hyperlipidemia   . Hypertension   . Melena     Past Surgical History:  Procedure Laterality Date  . CERVICAL DISCECTOMY  08/23/00   C5-6, C6-7 anterior cervical diskectomy with fibular bone bank fusion followed by Atlantis anterior cervical plating with the operating microscope  --  SURGEON:  Faythe Ghee, M.D.  . PARTIAL HYSTERECTOMY      Prior to Admission medications   Medication Sig Start Date End Date Taking? Authorizing Provider  albuterol (PROVENTIL HFA;VENTOLIN HFA) 108 (90 Base) MCG/ACT inhaler Inhale 2 puffs into the lungs as directed. 01/28/17  Yes [provider]  amLODipine (NORVASC) 5 MG tablet Take 1 tablet (5 mg total) by mouth daily. 09/29/12  Yes Nahser, Wonda Cheng, MD  aspirin 81 MG tablet Take 81 mg by mouth daily.      Yes [provider]  calcium carbonate (TUMS - DOSED IN MG ELEMENTAL CALCIUM) 500 MG chewable tablet Chew 1-2 tablets by mouth as needed for indigestion or heartburn.   Yes [provider]  Calcium Carbonate-Vitamin D (CALCIUM 600 + D PO) Take 1 tablet by mouth daily.     Yes [provider]  carvedilol (COREG) 25 MG tablet Take 1 tablet (25 mg total) by mouth 2 (two) times daily. 07/19/17  Yes Nahser, Wonda Cheng, MD  cholecalciferol (VITAMIN D) 1000 UNITS tablet Take 1,000 Units by mouth daily.     Yes [provider]  doxazosin (CARDURA) 2 MG tablet Take 1 tablet (2 mg total) by mouth at bedtime. 06/24/17  Yes Nahser, Wonda Cheng, MD  fenofibrate 160 MG tablet TAKE 1 TABLET BY MOUTH EVERY DAY 12/10/14  Yes Nahser, Wonda Cheng, MD  fish oil-omega-3 fatty acids 1000 MG capsule Take 1 g by mouth daily.     Yes [provider]  Flaxseed, Linseed, (FLAX SEED OIL PO) Take 1 capsule by mouth daily.     Yes [provider]  guaiFENesin (MUCINEX) 600 MG 12 hr tablet Take 600 mg by mouth 2 (two) times daily as needed for cough or to loosen phlegm.   Yes [provider]  hydrochlorothiazide (HYDRODIURIL) 25 MG tablet Take 25 mg by mouth as needed. 01/10/16  Yes [provider]  ibuprofen (ADVIL,MOTRIN) 600 MG tablet Take 600 mg by mouth every  6 (six) hours as needed. 05/14/17  Yes [provider]  lisinopril (PRINIVIL,ZESTRIL) 40 MG tablet TAKE 1 TABLET BY MOUTH EVERY DAY 05/15/14  Yes Nahser, Wonda Cheng, MD  metFORMIN (GLUCOPHAGE) 1000 MG tablet Take 1,000 mg by mouth daily.    Yes [provider]  Propylene Glycol (SYSTANE BALANCE) 0.6 % SOLN Apply 1 drop to eye as needed (dry eyes).   Yes [provider]  simvastatin (ZOCOR) 20 MG tablet Take 1 tablet (20 mg total) by mouth at bedtime. 12/28/11  Yes Nahser, Wonda Cheng, MD  vitamin A 8000 UNIT capsule Take 1 capsule by mouth daily. 08/27/15  Yes [provider]   vitamin C (ASCORBIC ACID) 500 MG tablet Take 500 mg by mouth daily.     Yes [provider]    Scheduled Meds: . albuterol  2 puff Inhalation UD  . [START ON 11/21/2017] carvedilol  25 mg Oral BID  . insulin aspart  0-15 Units Subcutaneous TID WC  . insulin aspart  0-5 Units Subcutaneous QHS   Continuous Infusions: . sodium chloride    . pantoprozole (PROTONIX) infusion 8 mg/hr (11/20/17 1322)   PRN Meds:.acetaminophen **OR** acetaminophen, ondansetron **OR** ondansetron (ZOFRAN) IV, traZODone  Allergies as of 11/20/2017 - Review Complete 11/20/2017  Allergen Reaction Noted  . Tiazac [diltiazem hcl]  01/14/2011    Family History  Problem Relation Age of Onset  . Emphysema Father   . Stroke Mother   . Hypertension Mother   . Atrial fibrillation Mother        heart flutter    Social History   Socioeconomic History  . Marital status: Married    Spouse name: Not on file  . Number of children: Not on file  . Years of education: Not on file  . Highest education level: Not on file  Occupational History  . Not on file  Social Needs  . Financial resource strain: Not on file  . Food insecurity:    Worry: Not on file    Inability: Not on file  . Transportation needs:    Medical: Not on file    Non-medical: Not on file  Tobacco Use  . Smoking status: Former Smoker    Packs/day: 1.00    Years: 18.00    Pack years: 18.00    Types: Cigarettes    Last attempt to quit: 01/13/1974    Years since quitting: 43.8  . Smokeless tobacco: Never Used  Substance and Sexual Activity  . Alcohol use: No  . Drug use: No  . Sexual activity: Not on file  Lifestyle  . Physical activity:    Days per week: Not on file    Minutes per session: Not on file  . Stress: Not on file  Relationships  . Social connections:    Talks on phone: Not on file    Gets together: Not on file    Attends religious service: Not on file    Active member of club or organization: Not on file     Attends meetings of clubs or organizations: Not on file    Relationship status: Not on file  . Intimate partner violence:    Fear of current or ex partner: Not on file    Emotionally abused: Not on file    Physically abused: Not on file    Forced sexual activity: Not on file  Other Topics Concern  . Not on file  Social History Narrative  . Not on file  Review of Systems: Review of Systems  Constitutional: Positive for malaise/fatigue. Negative for chills and fever.  HENT: Positive for hearing loss. Negative for tinnitus.   Eyes: Negative for blurred vision and double vision.  Respiratory: Positive for cough, sputum production and shortness of breath. Negative for hemoptysis.   Cardiovascular: Negative for chest pain and palpitations.  Gastrointestinal: Positive for abdominal pain, heartburn, melena, nausea and vomiting. Negative for constipation and diarrhea.  Genitourinary: Negative for dysuria and urgency.  Musculoskeletal: Positive for back pain, joint pain and myalgias.  Skin: Negative for itching and rash.  Neurological: Negative for seizures and loss of consciousness.  Endo/Heme/Allergies: Does not bruise/bleed easily.  Psychiatric/Behavioral: Negative for hallucinations and suicidal ideas.    Physical Exam: Vital signs: Vitals:   11/20/17 1300 11/20/17 1315  BP: (!) 134/45 (!) 141/46  Pulse: 85 87  Resp: 18 20  Temp:  98.1 F (36.7 C)  SpO2: 97% 95%     Physical Exam  Constitutional: She is oriented to person, place, and time. She appears well-developed and well-nourished. No distress.  HENT:  Head: Normocephalic and atraumatic.  Mouth/Throat: Oropharynx is clear and moist. No oropharyngeal exudate.  Mucous membranes dry  Eyes: Pupils are equal, round, and reactive to light. EOM are normal. No scleral icterus.  Neck: Normal range of motion. Neck supple.  Cardiovascular: Normal rate, regular rhythm and normal heart sounds.  Pulmonary/Chest: Effort normal.   Bilateral rhonchi. No respiratory distress  Abdominal: Soft. Bowel sounds are normal. She exhibits no distension. There is tenderness. There is no rebound and no guarding.  Mild epigastric and right upper quadrant tenderness to palpation  Musculoskeletal: Normal range of motion. She exhibits no edema.  Neurological: She is alert and oriented to person, place, and time.  Skin: Skin is dry. No erythema.  Psychiatric: She has a normal mood and affect. Judgment normal.  Vitals reviewed.   GI:  Lab Results: Recent Labs    11/20/17 1124  WBC 9.8  HGB 6.4*  HCT 19.3*  PLT 316   BMET Recent Labs    11/20/17 1124  NA 126*  K 4.7  CL 94*  CO2 23  GLUCOSE 233*  BUN 70*  CREATININE 1.26*  CALCIUM 8.7*   LFT Recent Labs    11/20/17 1124  PROT 5.6*  ALBUMIN 2.9*  AST 22  ALT 15  ALKPHOS 17*  BILITOT 0.5   PT/INR Recent Labs    11/20/17 1124  LABPROT 14.8  INR 1.17     Studies/Results: No results found.  Impression/Plan: - hematemesis and melena in setting of NSAID use. Most likely ulcer disease.no significant alcohol use. Normal LFTs. Normal INR. Normal platelet count. No evidence of liver disease. - Acute blood loss anemia. Hemoglobin 6.4. No recent baseline. Patient complaining of fatigue and weakness since last 1 month.  Recommendations -------------------------- - transfuse as needed to keep hemoglobin around 7-8. - tentative plan for EGD tomorrow by Dr. Carlean Purl.Okay to have clear liquid diet today. Nothing by mouth past midnight. - Avoid NSAIDs.  - Patient is followed by Dr. Collene Mares. Discussed with Dr. Carlean Purl who will take over patient's care from tomorrow.    LOS: 0 days   Otis Brace  MD, FACP 11/20/2017, 2:26 PM  Contact #  [phone]

## 2017-11-20 NOTE — H&P (Addendum)
.  History and Physical    Carla Foster DOB: [birth year] DOA: 11/20/2017  PCP: Christain Sacramento, MD Patient coming from: home  Chief Complaint: hematemesis/melena/abdominal pain  HPI: Carla Foster is a very pleasant very HOH 77 y.o. female with medical history significant diabetes, asthma not on home oxygen, hyperlipidemia, hypertension since to the emergency Department chief complaint of hematemesis/melena/abdominal pain. Initial workup reveals acute blood loss anemia likely related to GI bleed in the setting of NSAID use. [facility] are asked to admit  Information is obtained from the patient and her daughters who are at the bedside as well as the chart. She reports over the last month or so she has experienced generalized fatigue and DOE as well as intermittent "burning" in her stomach. She states it is located just right of the epigastric area it will awaken her at night and is relieved with food. She states that she takes 2 Advil every night for general aches and pains and has done so for "years". She denies EtOH use. This morning she awakened upon standing she felt "very dizzy and lightheaded".She reports sitting on the commode and suddenly felt nauseous and vomited bright red blood. She denies any coffee ground emesis. She denies any clots in the emesis. She denies any undigested food in the emesis. This was the only episode of hematemesis since she had. However she had 3 episodes of dark tarry stools. She denies headache visual disturbances numbness tingling of extremities. She denies chest pain palpitations. He denies lower extremity edema or orthopnea. She denies dysuria hematuria frequency or urgency. She denies fever chillsrecent travel or sick contacts.  ED Course: in the emergency department she's afebrile hemodynamically stable and not hypoxic. She is given 80 mg of protonic since intravenously and a Protonix drip is initiated per request of gastroenterology. He had  no further episodes of bleeding since her arrival to the emergency department  Review of Systems: As per HPI otherwise all other systems reviewed and are negative.   Ambulatory Status: he lives alone she ambulates independently is steady on her feet no recent falls  Past Medical History:  Diagnosis Date  . Acute blood loss anemia   . AKI (acute kidney injury) ([place])   . Asthma   . Diabetes mellitus    type 2  . GI bleed   . Hematemesis   . Hyperlipidemia   . Hypertension   . Melena     Past Surgical History:  Procedure Laterality Date  . CERVICAL DISCECTOMY  08/23/00   C5-6, C6-7 anterior cervical diskectomy with fibular bone bank fusion followed by Atlantis anterior cervical plating with the operating microscope  --  SURGEON:  Faythe Ghee, M.D.  . PARTIAL HYSTERECTOMY      Social History   Socioeconomic History  . Marital status: Married    Spouse name: Not on file  . Number of children: Not on file  . Years of education: Not on file  . Highest education level: Not on file  Occupational History  . Not on file  Social Needs  . Financial resource strain: Not on file  . Food insecurity:    Worry: Not on file    Inability: Not on file  . Transportation needs:    Medical: Not on file    Non-medical: Not on file  Tobacco Use  . Smoking status: Former Smoker    Packs/day: 1.00    Years: 18.00    Pack years: 18.00  Types: Cigarettes    Last attempt to quit: 01/13/1974    Years since quitting: 43.8  . Smokeless tobacco: Never Used  Substance and Sexual Activity  . Alcohol use: No  . Drug use: No  . Sexual activity: Not on file  Lifestyle  . Physical activity:    Days per week: Not on file    Minutes per session: Not on file  . Stress: Not on file  Relationships  . Social connections:    Talks on phone: Not on file    Gets together: Not on file    Attends religious service: Not on file    Active member of club or organization: Not on file    Attends  meetings of clubs or organizations: Not on file    Relationship status: Not on file  . Intimate partner violence:    Fear of current or ex partner: Not on file    Emotionally abused: Not on file    Physically abused: Not on file    Forced sexual activity: Not on file  Other Topics Concern  . Not on file  Social History Narrative  . Not on file    Allergies  Allergen Reactions  . Tiazac [Diltiazem Hcl]     Ankle edema    Family History  Problem Relation Age of Onset  . Emphysema Father   . Stroke Mother   . Hypertension Mother   . Atrial fibrillation Mother        heart flutter    Prior to Admission medications   Medication Sig Start Date End Date Taking? Authorizing Provider  albuterol (PROVENTIL HFA;VENTOLIN HFA) 108 (90 Base) MCG/ACT inhaler Inhale 2 puffs into the lungs as directed. 01/28/17   [provider]  amLODipine (NORVASC) 5 MG tablet Take 1 tablet (5 mg total) by mouth daily. 09/29/12   Nahser, Wonda Cheng, MD  aspirin 81 MG tablet Take 81 mg by mouth daily.      [provider]  Calcium Carbonate-Vitamin D (CALCIUM 600 + D PO) Take 1 tablet by mouth daily.      [provider]  carvedilol (COREG) 25 MG tablet Take 1 tablet (25 mg total) by mouth 2 (two) times daily. 07/19/17   Nahser, Wonda Cheng, MD  cholecalciferol (VITAMIN D) 1000 UNITS tablet Take 1,000 Units by mouth daily.      [provider]  doxazosin (CARDURA) 2 MG tablet Take 1 tablet (2 mg total) by mouth at bedtime. 06/24/17   Nahser, Wonda Cheng, MD  fenofibrate 160 MG tablet TAKE 1 TABLET BY MOUTH EVERY DAY 12/10/14   Nahser, Wonda Cheng, MD  fish oil-omega-3 fatty acids 1000 MG capsule Take 1 g by mouth daily.      [provider]  Flaxseed, Linseed, (FLAX SEED OIL PO) Take 1 capsule by mouth daily.      [provider]  hydrochlorothiazide (HYDRODIURIL) 25 MG tablet Take 25 mg by mouth as needed. 01/10/16   [provider]  ibuprofen (ADVIL,MOTRIN)  600 MG tablet Take 600 mg by mouth every 6 (six) hours as needed. 05/14/17   [provider]  lisinopril (PRINIVIL,ZESTRIL) 40 MG tablet TAKE 1 TABLET BY MOUTH EVERY DAY 05/15/14   Nahser, Wonda Cheng, MD  metFORMIN (GLUCOPHAGE) 1000 MG tablet Take 1,000 mg by mouth daily.     [provider]  simvastatin (ZOCOR) 20 MG tablet Take 1 tablet (20 mg total) by mouth at bedtime. 12/28/11   Nahser, Wonda Cheng, MD  vitamin A 8000 UNIT capsule Take 1 capsule by mouth daily. 08/27/15   [provider]  vitamin C (ASCORBIC ACID) 500 MG tablet Take 500 mg by mouth daily.      [provider]    Physical Exam: Vitals:   11/20/17 1200 11/20/17 1252 11/20/17 1300 11/20/17 1315  BP: (!) 134/53 (!) 133/42 (!) 134/45 (!) 141/46  Pulse: 89 87 85 87  Resp: 16 18 18 20   Temp:  98.4 F (36.9 C)  98.1 F (36.7 C)  TempSrc:  Oral  Oral  SpO2: 98% 96% 97% 95%  Weight:      Height:         General:  Appears calm and comfortable slightly pale but in no acute distress Eyes:  PERRL, EOMI, normal lids, iris, pale conjunctiva ENT:  grossly normal hearing, lips & tongue, mucous membranes of her mouth are slightly pale somewhat dry Neck:  no LAD, masses or thyromegaly Cardiovascular:  RRR, no m/r/g. No LE edema. . Pulses are present and palpable Respiratory:  CTA bilaterally, no w/r/r. Normal respiratory effort. Abdomen:  soft, nondistended positive bowel sounds but somewhat sluggish mild tenderness right upper quadrant to palpation no guarding or rebounding Skin:  no rash or induration seen on limited exam Musculoskeletal:  grossly normal tone BUE/BLE, good ROM, no bony abnormality Psychiatric:  grossly normal mood and affect, speech fluent and appropriate, AOx3 Neurologic:  CN 2-12 grossly intact, moves all extremities in coordinated fashion, sensation intact  Labs on Admission: I have personally reviewed following labs and imaging studies  CBC: Recent Labs  Lab 11/20/17 1124    WBC 9.8  NEUTROABS 8.4*  HGB 6.4*  HCT 19.3*  MCV 81.8  PLT 270   Basic Metabolic Panel: Recent Labs  Lab 11/20/17 1124  NA 126*  K 4.7  CL 94*  CO2 23  GLUCOSE 233*  BUN 70*  CREATININE 1.26*  CALCIUM 8.7*   GFR: Estimated Creatinine Clearance: 34.4 mL/min (A) (by C-G formula based on SCr of 1.26 mg/dL (H)). Liver Function Tests: Recent Labs  Lab 11/20/17 1124  AST 22  ALT 15  ALKPHOS 17*  BILITOT 0.5  PROT 5.6*  ALBUMIN 2.9*   No results for input(s): LIPASE, AMYLASE in the last 168 hours. No results for input(s): AMMONIA in the last 168 hours. Coagulation Profile: Recent Labs  Lab 11/20/17 1124  INR 1.17   Cardiac Enzymes: No results for input(s): CKTOTAL, CKMB, CKMBINDEX, TROPONINI in the last 168 hours. BNP (last 3 results) No results for input(s): PROBNP in the last 8760 hours. HbA1C: No results for input(s): HGBA1C in the last 72 hours. CBG: No results for input(s): GLUCAP in the last 168 hours. Lipid Profile: No results for input(s): CHOL, HDL, LDLCALC, TRIG, CHOLHDL, LDLDIRECT in the last 72 hours. Thyroid Function Tests: No results for input(s): TSH, T4TOTAL, FREET4, T3FREE, THYROIDAB in the last 72 hours. Anemia Panel: No results for input(s): VITAMINB12, FOLATE, FERRITIN, TIBC, IRON, RETICCTPCT in the last 72 hours. Urine analysis: No results found for: COLORURINE, APPEARANCEUR, LABSPEC, PHURINE, GLUCOSEU, HGBUR, BILIRUBINUR, KETONESUR, PROTEINUR, UROBILINOGEN, NITRITE, LEUKOCYTESUR  Creatinine Clearance: Estimated Creatinine Clearance: 34.4 mL/min (A) (by C-G formula based on SCr of 1.26 mg/dL (H)).  Sepsis Labs: @LABRCNTIP (procalcitonin:4,lacticidven:4) )No results found for this or any previous visit (from the past 240 hour(s)).   Radiological Exams on Admission: No results found.  EKG:  Personally reviewed: Sinus rhythm no acute st/ts   Assessment/Plan Principal Problem:   Acute blood loss anemia  Active Problems:   HTN  (hypertension)   Hyperlipidemia   Hematemesis   Melena   GI bleed   Hyponatremia   AKI (acute kidney injury) ([place])   #1. Acute blood loss anemia. Likely related to gi bleed in setting of NSAID use. Hemoglobin 6.4 on admission.Patient with dizziness dyspnea on exertion. Orthostatic. -Admit to step down -transfuse 2 units packed red blood cells -Serial CBCs -supplemental oxygen as indicated -Monitor oxygen saturation level -eGD tomorrow per GI  #2.hematemesis/melena/GI bleed in setting of prolonged NSAID use +FOBT.no EtOH use. No anticoagulation. BUN 70 creatinine 1.26. No episodes since presentation. Vital with protonic 7 the emergency department. -continue protonix gtt -anti-emetic -sips with meds only -npo past midnight -monitor closely  3. Hyponatremia. Mild. Sodium 126. Likely related to above -Gentle IV fluids -Recheck  4.hypertension.fair control in the emergency department. Home medications include amlodipine, Coreg, cardura hydrochlorothiazide, lisinopril -continue coreg with parameters -hold all other antihypertensive meds -monitor  5. Hyperlipidemia. Home medications include Zocor  #6. Diabetes.home medications include oral agents only. Serum glucose 233 on admission. -Obtain a hemoglobin A1c -Sliding scale insulin for optimal control  7. Acute kidney injury. Mild. Creatinine 1.26. Maybe component of chronic to this given her diabetes. -hold nephrotoxins -Gentle IV fluids -Monitor urine output -Recheck in the morning   DVT prophylaxis: scd  Code Status: full  Family Communication: daughters at baseline  Disposition Plan: home  Consults called: gi per ed provider  Admission status: inpatient    Radene Gunning MD [facility]  If 7PM-7AM, please contact night-coverage [email or web address] Password [email or web address]  11/20/2017, 1:41 PM

## 2017-11-20 NOTE — Consult Note (Addendum)
Referring Provider: ER Primary Care Physician:  Christain Sacramento, MD Primary Gastroenterologist:  Dr. Collene Mares   Reason for Consultation:  GI bleed  HPI: Carla Foster is a 77 y.o. female with past medical history of hypertension, hyperlipidemia and diabetes presented to the hospital with dark stool and 1 episode of vomiting of blood.she was found to have a hemoglobin of 6.4. GI is consulted for further evaluation.  Patient seen and examined at bedside.complaining of intermittent fatigue and shortness of breath with exertion for last 1 month.patient started noticing dark-colored stool yesterday.patient woke up this morning with stomach upset followed by 1 episode of vomiting of blood. Patient had a few more episodes of black colored stool today.complaining of acid reflux. Denies any dysphagia or odynophagia. Denied any bright red blood per rectum. Takes Advil on a daily basis for generalized body ache.  Personal history of colon polyps. colonoscopy in the past by Dr. Collene Mares.  Past Medical History:  Diagnosis Date  . Acute blood loss anemia   . AKI (acute kidney injury) ([place])   . Asthma   . Diabetes mellitus    type 2  . GI bleed   . Hematemesis   . Hyperlipidemia   . Hypertension   . Melena     Past Surgical History:  Procedure Laterality Date  . CERVICAL DISCECTOMY  08/23/00   C5-6, C6-7 anterior cervical diskectomy with fibular bone bank fusion followed by Atlantis anterior cervical plating with the operating microscope  --  SURGEON:  Faythe Ghee, M.D.  . PARTIAL HYSTERECTOMY      Prior to Admission medications   Medication Sig Start Date End Date Taking? Authorizing Provider  albuterol (PROVENTIL HFA;VENTOLIN HFA) 108 (90 Base) MCG/ACT inhaler Inhale 2 puffs into the lungs as directed. 01/28/17  Yes [provider]  amLODipine (NORVASC) 5 MG tablet Take 1 tablet (5 mg total) by mouth daily. 09/29/12  Yes Nahser, Wonda Cheng, MD  aspirin 81 MG tablet Take 81 mg by mouth daily.      Yes [provider]  calcium carbonate (TUMS - DOSED IN MG ELEMENTAL CALCIUM) 500 MG chewable tablet Chew 1-2 tablets by mouth as needed for indigestion or heartburn.   Yes [provider]  Calcium Carbonate-Vitamin D (CALCIUM 600 + D PO) Take 1 tablet by mouth daily.     Yes [provider]  carvedilol (COREG) 25 MG tablet Take 1 tablet (25 mg total) by mouth 2 (two) times daily. 07/19/17  Yes Nahser, Wonda Cheng, MD  cholecalciferol (VITAMIN D) 1000 UNITS tablet Take 1,000 Units by mouth daily.     Yes [provider]  doxazosin (CARDURA) 2 MG tablet Take 1 tablet (2 mg total) by mouth at bedtime. 06/24/17  Yes Nahser, Wonda Cheng, MD  fenofibrate 160 MG tablet TAKE 1 TABLET BY MOUTH EVERY DAY 12/10/14  Yes Nahser, Wonda Cheng, MD  fish oil-omega-3 fatty acids 1000 MG capsule Take 1 g by mouth daily.     Yes [provider]  Flaxseed, Linseed, (FLAX SEED OIL PO) Take 1 capsule by mouth daily.     Yes [provider]  guaiFENesin (MUCINEX) 600 MG 12 hr tablet Take 600 mg by mouth 2 (two) times daily as needed for cough or to loosen phlegm.   Yes [provider]  hydrochlorothiazide (HYDRODIURIL) 25 MG tablet Take 25 mg by mouth as needed. 01/10/16  Yes [provider]  ibuprofen (ADVIL,MOTRIN) 600 MG tablet Take 600 mg by mouth every  6 (six) hours as needed. 05/14/17  Yes [provider]  lisinopril (PRINIVIL,ZESTRIL) 40 MG tablet TAKE 1 TABLET BY MOUTH EVERY DAY 05/15/14  Yes Nahser, Wonda Cheng, MD  metFORMIN (GLUCOPHAGE) 1000 MG tablet Take 1,000 mg by mouth daily.    Yes [provider]  Propylene Glycol (SYSTANE BALANCE) 0.6 % SOLN Apply 1 drop to eye as needed (dry eyes).   Yes [provider]  simvastatin (ZOCOR) 20 MG tablet Take 1 tablet (20 mg total) by mouth at bedtime. 12/28/11  Yes Nahser, Wonda Cheng, MD  vitamin A 8000 UNIT capsule Take 1 capsule by mouth daily. 08/27/15  Yes [provider]   vitamin C (ASCORBIC ACID) 500 MG tablet Take 500 mg by mouth daily.     Yes [provider]    Scheduled Meds: . albuterol  2 puff Inhalation UD  . [START ON 11/21/2017] carvedilol  25 mg Oral BID  . insulin aspart  0-15 Units Subcutaneous TID WC  . insulin aspart  0-5 Units Subcutaneous QHS   Continuous Infusions: . sodium chloride    . pantoprozole (PROTONIX) infusion 8 mg/hr (11/20/17 1322)   PRN Meds:.acetaminophen **OR** acetaminophen, ondansetron **OR** ondansetron (ZOFRAN) IV, traZODone  Allergies as of 11/20/2017 - Review Complete 11/20/2017  Allergen Reaction Noted  . Tiazac [diltiazem hcl]  01/14/2011    Family History  Problem Relation Age of Onset  . Emphysema Father   . Stroke Mother   . Hypertension Mother   . Atrial fibrillation Mother        heart flutter    Social History   Socioeconomic History  . Marital status: Married    Spouse name: Not on file  . Number of children: Not on file  . Years of education: Not on file  . Highest education level: Not on file  Occupational History  . Not on file  Social Needs  . Financial resource strain: Not on file  . Food insecurity:    Worry: Not on file    Inability: Not on file  . Transportation needs:    Medical: Not on file    Non-medical: Not on file  Tobacco Use  . Smoking status: Former Smoker    Packs/day: 1.00    Years: 18.00    Pack years: 18.00    Types: Cigarettes    Last attempt to quit: 01/13/1974    Years since quitting: 43.8  . Smokeless tobacco: Never Used  Substance and Sexual Activity  . Alcohol use: No  . Drug use: No  . Sexual activity: Not on file  Lifestyle  . Physical activity:    Days per week: Not on file    Minutes per session: Not on file  . Stress: Not on file  Relationships  . Social connections:    Talks on phone: Not on file    Gets together: Not on file    Attends religious service: Not on file    Active member of club or organization: Not on file     Attends meetings of clubs or organizations: Not on file    Relationship status: Not on file  . Intimate partner violence:    Fear of current or ex partner: Not on file    Emotionally abused: Not on file    Physically abused: Not on file    Forced sexual activity: Not on file  Other Topics Concern  . Not on file  Social History Narrative  . Not on file  Review of Systems: Review of Systems  Constitutional: Positive for malaise/fatigue. Negative for chills and fever.  HENT: Positive for hearing loss. Negative for tinnitus.   Eyes: Negative for blurred vision and double vision.  Respiratory: Positive for cough, sputum production and shortness of breath. Negative for hemoptysis.   Cardiovascular: Negative for chest pain and palpitations.  Gastrointestinal: Positive for abdominal pain, heartburn, melena, nausea and vomiting. Negative for constipation and diarrhea.  Genitourinary: Negative for dysuria and urgency.  Musculoskeletal: Positive for back pain, joint pain and myalgias.  Skin: Negative for itching and rash.  Neurological: Negative for seizures and loss of consciousness.  Endo/Heme/Allergies: Does not bruise/bleed easily.  Psychiatric/Behavioral: Negative for hallucinations and suicidal ideas.    Physical Exam: Vital signs: Vitals:   11/20/17 1300 11/20/17 1315  BP: (!) 134/45 (!) 141/46  Pulse: 85 87  Resp: 18 20  Temp:  98.1 F (36.7 C)  SpO2: 97% 95%     Physical Exam  Constitutional: She is oriented to person, place, and time. She appears well-developed and well-nourished. No distress.  HENT:  Head: Normocephalic and atraumatic.  Mouth/Throat: Oropharynx is clear and moist. No oropharyngeal exudate.  Mucous membranes dry  Eyes: Pupils are equal, round, and reactive to light. EOM are normal. No scleral icterus.  Neck: Normal range of motion. Neck supple.  Cardiovascular: Normal rate, regular rhythm and normal heart sounds.  Pulmonary/Chest: Effort normal.   Bilateral rhonchi. No respiratory distress  Abdominal: Soft. Bowel sounds are normal. She exhibits no distension. There is tenderness. There is no rebound and no guarding.  Mild epigastric and right upper quadrant tenderness to palpation  Musculoskeletal: Normal range of motion. She exhibits no edema.  Neurological: She is alert and oriented to person, place, and time.  Skin: Skin is dry. No erythema.  Psychiatric: She has a normal mood and affect. Judgment normal.  Vitals reviewed.   GI:  Lab Results: Recent Labs    11/20/17 1124  WBC 9.8  HGB 6.4*  HCT 19.3*  PLT 316   BMET Recent Labs    11/20/17 1124  NA 126*  K 4.7  CL 94*  CO2 23  GLUCOSE 233*  BUN 70*  CREATININE 1.26*  CALCIUM 8.7*   LFT Recent Labs    11/20/17 1124  PROT 5.6*  ALBUMIN 2.9*  AST 22  ALT 15  ALKPHOS 17*  BILITOT 0.5   PT/INR Recent Labs    11/20/17 1124  LABPROT 14.8  INR 1.17     Studies/Results: No results found.  Impression/Plan: - hematemesis and melena in setting of NSAID use. Most likely ulcer disease.no significant alcohol use. Normal LFTs. Normal INR. Normal platelet count. No evidence of liver disease. - Acute blood loss anemia. Hemoglobin 6.4. No recent baseline. Patient complaining of fatigue and weakness since last 1 month.  Recommendations -------------------------- - transfuse as needed to keep hemoglobin around 7-8. - tentative plan for EGD tomorrow by Dr. Carlean Purl.Okay to have clear liquid diet today. Nothing by mouth past midnight. - Avoid NSAIDs.  - Patient is followed by Dr. Collene Mares. Discussed with Dr. Carlean Purl who will take over patient's care from tomorrow.    LOS: 0 days   Otis Brace  MD, FACP 11/20/2017, 2:26 PM  Contact #  [phone]

## 2017-11-20 NOTE — ED Notes (Signed)
Admitting at bedside 

## 2017-11-20 NOTE — ED Provider Notes (Signed)
[facility] Provider Note   CSN: [id number] Arrival date & time: 11/20/17  1108     History   Chief Complaint Chief Complaint  Patient presents with  . Rectal Bleeding    HPI Carla Foster is a 77 y.o. female.  77 year old female with history of hypertension diabetes asthma here with acute onset this morning of vomiting bright red blood.  It was followed by 5 episodes of dark stool.  She is never had this before.  They called EMS and she was found to be posturally hypotensive.  She is not on any blood thinners.  She is complained of over a months worth of upper right-sided abdominal pain that is burning in nature and stinging.  She is tried nothing for it.  She has no known history of any ulcers.  She is also had a chronic cough but no chest pain no syncope.  There is been no fevers.  The history is provided by the patient.  Rectal Bleeding  Quality:  Black and tarry Duration:  1 day Timing:  Intermittent Chronicity:  New Context: spontaneously   Similar prior episodes: no   Associated symptoms: abdominal pain, hematemesis and vomiting   Associated symptoms: no fever and no loss of consciousness   Abdominal pain:    Location:  RUQ   Quality: burning     Severity:  Mild   Onset quality:  Gradual   Duration:  1 month   Timing:  Intermittent   Chronicity:  New Risk factors: no anticoagulant use     Past Medical History:  Diagnosis Date  . Asthma   . Diabetes mellitus    type 2  . Hyperlipidemia   . Hypertension     Patient Active Problem List   Diagnosis Date Noted  . HTN (hypertension) 01/23/2011  . Hyperlipidemia 01/23/2011    Past Surgical History:  Procedure Laterality Date  . CERVICAL DISCECTOMY  08/23/00   C5-6, C6-7 anterior cervical diskectomy with fibular bone bank fusion followed by Atlantis anterior cervical plating with the operating microscope  --  SURGEON:  Faythe Ghee, M.D.  . PARTIAL HYSTERECTOMY        OB History   None      Home Medications    Prior to Admission medications   Medication Sig Start Date End Date Taking? Authorizing Provider  albuterol (PROVENTIL HFA;VENTOLIN HFA) 108 (90 Base) MCG/ACT inhaler Inhale 2 puffs into the lungs as directed. 01/28/17   [provider]  amLODipine (NORVASC) 5 MG tablet Take 1 tablet (5 mg total) by mouth daily. 09/29/12   Nahser, Wonda Cheng, MD  aspirin 81 MG tablet Take 81 mg by mouth daily.      [provider]  Calcium Carbonate-Vitamin D (CALCIUM 600 + D PO) Take 1 tablet by mouth daily.      [provider]  carvedilol (COREG) 25 MG tablet Take 1 tablet (25 mg total) by mouth 2 (two) times daily. 07/19/17   Nahser, Wonda Cheng, MD  cholecalciferol (VITAMIN D) 1000 UNITS tablet Take 1,000 Units by mouth daily.      [provider]  doxazosin (CARDURA) 2 MG tablet Take 1 tablet (2 mg total) by mouth at bedtime. 06/24/17   Nahser, Wonda Cheng, MD  fenofibrate 160 MG tablet TAKE 1 TABLET BY MOUTH EVERY DAY 12/10/14   Nahser, Wonda Cheng, MD  fish oil-omega-3 fatty acids 1000 MG capsule Take 1 g by mouth daily.  [provider]  Flaxseed, Linseed, (FLAX SEED OIL PO) Take 1 capsule by mouth daily.      [provider]  hydrochlorothiazide (HYDRODIURIL) 25 MG tablet Take 25 mg by mouth as needed. 01/10/16   [provider]  ibuprofen (ADVIL,MOTRIN) 600 MG tablet Take 600 mg by mouth every 6 (six) hours as needed. 05/14/17   [provider]  lisinopril (PRINIVIL,ZESTRIL) 40 MG tablet TAKE 1 TABLET BY MOUTH EVERY DAY 05/15/14   Nahser, Wonda Cheng, MD  metFORMIN (GLUCOPHAGE) 1000 MG tablet Take 1,000 mg by mouth daily.     [provider]  simvastatin (ZOCOR) 20 MG tablet Take 1 tablet (20 mg total) by mouth at bedtime. 12/28/11   Nahser, Wonda Cheng, MD  vitamin A 8000 UNIT capsule Take 1 capsule by mouth daily. 08/27/15   [provider]  vitamin C (ASCORBIC ACID) 500 MG  tablet Take 500 mg by mouth daily.      [provider]    Family History Family History  Problem Relation Age of Onset  . Emphysema Father   . Stroke Mother   . Hypertension Mother   . Atrial fibrillation Mother        heart flutter    Social History Social History   Tobacco Use  . Smoking status: Former Smoker    Packs/day: 1.00    Years: 18.00    Pack years: 18.00    Types: Cigarettes    Last attempt to quit: 01/13/1974    Years since quitting: 43.8  . Smokeless tobacco: Never Used  Substance Use Topics  . Alcohol use: No  . Drug use: No     Allergies   Tiazac [diltiazem hcl]   Review of Systems Review of Systems  Constitutional: Negative for chills and fever.  HENT: Negative for ear pain and sore throat.   Eyes: Negative for pain and visual disturbance.  Respiratory: Positive for cough. Negative for shortness of breath.   Cardiovascular: Negative for chest pain and palpitations.  Gastrointestinal: Positive for abdominal pain, hematemesis, hematochezia and vomiting.  Genitourinary: Negative for dysuria and hematuria.  Musculoskeletal: Negative for arthralgias and back pain.  Skin: Negative for color change and rash.  Neurological: Negative for seizures, loss of consciousness and syncope.  All other systems reviewed and are negative.    Physical Exam Updated Vital Signs BP (!) 135/51 (BP Location: Right Arm)   Pulse 92   Temp 98.1 F (36.7 C) (Oral)   Resp 20   Ht 5\' 6"  (1.676 m)   Wt 68 kg (150 lb)   SpO2 98%   BMI 24.21 kg/m   Physical Exam  Constitutional: She appears well-developed and well-nourished. No distress.  HENT:  Head: Normocephalic and atraumatic.  Eyes: Conjunctivae are normal.  Neck: Neck supple.  Cardiovascular: Normal rate and regular rhythm.  No murmur heard. Pulmonary/Chest: Effort normal and breath sounds normal. No respiratory distress.  Abdominal: Soft. There is no tenderness.  Musculoskeletal: She exhibits no  edema, tenderness or deformity.  Neurological: She is alert. She has normal strength. No cranial nerve deficit or sensory deficit. GCS eye subscore is 4. GCS verbal subscore is 5. GCS motor subscore is 6.  Skin: Skin is warm and dry.  Psychiatric: She has a normal mood and affect.  Nursing note and vitals reviewed.    ED Treatments / Results  Labs (all labs ordered are listed, but only abnormal results are displayed) Labs Reviewed  MRSA PCR SCREENING - Abnormal; Notable  for the following components:      Result Value   MRSA by PCR POSITIVE (*)    All other components within normal limits  COMPREHENSIVE METABOLIC PANEL - Abnormal; Notable for the following components:   Sodium 126 (*)    Chloride 94 (*)    Glucose, Bld 233 (*)    BUN 70 (*)    Creatinine, Ser 1.26 (*)    Calcium 8.7 (*)    Total Protein 5.6 (*)    Albumin 2.9 (*)    Alkaline Phosphatase 17 (*)    GFR calc non Af Amer 40 (*)    GFR calc Af Amer 46 (*)    All other components within normal limits  CBC WITH DIFFERENTIAL/PLATELET - Abnormal; Notable for the following components:   RBC 2.36 (*)    Hemoglobin 6.4 (*)    HCT 19.3 (*)    Neutro Abs 8.4 (*)    All other components within normal limits  HEMOGLOBIN A1C - Abnormal; Notable for the following components:   Hgb A1c MFr Bld 6.8 (*)    All other components within normal limits  COMPREHENSIVE METABOLIC PANEL - Abnormal; Notable for the following components:   Sodium 133 (*)    Glucose, Bld 119 (*)    BUN 49 (*)    Calcium 8.8 (*)    Total Protein 5.9 (*)    Albumin 3.1 (*)    Alkaline Phosphatase 20 (*)    GFR calc non Af Amer 55 (*)    All other components within normal limits  CBC - Abnormal; Notable for the following components:   WBC 11.1 (*)    RBC 3.44 (*)    Hemoglobin 9.4 (*)    HCT 28.2 (*)    All other components within normal limits  GLUCOSE, CAPILLARY - Abnormal; Notable for the following components:   Glucose-Capillary 153 (*)    All  other components within normal limits  CBC - Abnormal; Notable for the following components:   RBC 3.30 (*)    Hemoglobin 9.1 (*)    HCT 27.0 (*)    All other components within normal limits  GLUCOSE, CAPILLARY - Abnormal; Notable for the following components:   Glucose-Capillary 113 (*)    All other components within normal limits  GLUCOSE, CAPILLARY - Abnormal; Notable for the following components:   Glucose-Capillary 131 (*)    All other components within normal limits  CBC - Abnormal; Notable for the following components:   RBC 3.28 (*)    Hemoglobin 9.1 (*)    HCT 27.3 (*)    All other components within normal limits  GLUCOSE, CAPILLARY - Abnormal; Notable for the following components:   Glucose-Capillary 128 (*)    All other components within normal limits  GLUCOSE, CAPILLARY - Abnormal; Notable for the following components:   Glucose-Capillary 116 (*)    All other components within normal limits  POC OCCULT BLOOD, ED - Abnormal; Notable for the following components:   Fecal Occult Bld POSITIVE (*)    All other components within normal limits  CULTURE, EXPECTORATED SPUTUM-ASSESSMENT  CULTURE, EXPECTORATED SPUTUM-ASSESSMENT  PROTIME-INR  CLOTEST (H. PYLORI), BIOPSY  TYPE AND SCREEN  PREPARE RBC (CROSSMATCH)  ABO/RH    EKG EKG Interpretation  Date/Time:  Saturday Nov 20 2017 11:16:10 EDT Ventricular Rate:  92 PR Interval:    QRS Duration: 107 QT Interval:  370 QTC Calculation: 458 R Axis:   59 Text Interpretation:  Sinus rhythm no acute st/ts  similar to prior 2/02 Confirmed by Aletta Edouard [phone]) on 11/20/2017 11:40:22 AM   Radiology No results found.  Procedures .Critical Care Performed by: Hayden Rasmussen, MD Authorized by: Hayden Rasmussen, MD   Critical care provider statement:    Critical care time (minutes):  35   Critical care time was exclusive of:  Separately billable procedures and treating other patients and teaching time   Critical care  was necessary to treat or prevent imminent or life-threatening deterioration of the following conditions:  Circulatory failure   Critical care was time spent personally by me on the following activities:  Development of treatment plan with patient or surrogate, discussions with consultants, evaluation of patient's response to treatment, examination of patient, obtaining history from patient or surrogate, ordering and performing treatments and interventions, ordering and review of laboratory studies, ordering and review of radiographic studies, pulse oximetry, re-evaluation of patient's condition and review of old charts   I assumed direction of critical care for this patient from another provider in my specialty: no     (including critical care time)  Medications Ordered in ED Medications  pantoprazole (PROTONIX) injection 40 mg (has no administration in time range)  ondansetron (ZOFRAN) injection 4 mg (has no administration in time range)  sodium chloride 0.9 % bolus 1,000 mL (has no administration in time range)     Initial Impression / Assessment and Plan / ED Course  I have reviewed the triage vital signs and the nursing notes.  Pertinent labs & imaging results that were available during my care of the patient were reviewed by me and considered in my medical decision making (see chart for details).  Clinical Course as of Nov 23 739  Sat Nov 20, 2017  1139 Patient presents with what sounds like acute GI bleed.  We are establishing a second IV starting on some fluids and PPI.  Once we get her labs back we will consult GI.   [MB]  1200 Hemoglobin(!!): 6.4 [MB]  1200 Transfusion requested   [MB]  1201 Baseline H&H is 10 and 31.   [MB]  0102 Discussed with Dr. Alessandra Bevels GI who recommends Protonix drip and admission for anticipated EGD tomorrow.  He will evaluate the patient in the ER.   [MB]  1250 Discussed with Ms. Renard Hamper from the hospitalist team who will evaluate the patient in the  ED.  Currently we will place her on stepdown unit unless they feel she needs ICU.   [MB]    Clinical Course User Index [MB] Hayden Rasmussen, MD     Final Clinical Impressions(s) / ED Diagnoses   Final diagnoses:  Gastrointestinal hemorrhage, unspecified gastrointestinal hemorrhage type  Anemia, unspecified type    ED Discharge Orders    None       Hayden Rasmussen, MD 11/22/17 [id number]

## 2017-11-20 NOTE — ED Notes (Signed)
Pt denies SOB, chest pain, or other new symptoms

## 2017-11-21 ENCOUNTER — Encounter (HOSPITAL_COMMUNITY): Payer: Self-pay | Admitting: *Deleted

## 2017-11-21 ENCOUNTER — Encounter (HOSPITAL_COMMUNITY)
Admission: EM | Disposition: A | Discharge status: Home / Self Care | Payer: Self-pay | Source: Home / Self Care | Attending: Internal Medicine

## 2017-11-21 DIAGNOSIS — K254 Chronic or unspecified gastric ulcer with hemorrhage: Principal | ICD-10-CM

## 2017-11-21 DIAGNOSIS — D62 Acute posthemorrhagic anemia: Secondary | ICD-10-CM

## 2017-11-21 DIAGNOSIS — K3189 Other diseases of stomach and duodenum: Secondary | ICD-10-CM

## 2017-11-21 DIAGNOSIS — K253 Acute gastric ulcer without hemorrhage or perforation: Secondary | ICD-10-CM

## 2017-11-21 DIAGNOSIS — I10 Essential (primary) hypertension: Secondary | ICD-10-CM

## 2017-11-21 DIAGNOSIS — K921 Melena: Secondary | ICD-10-CM

## 2017-11-21 HISTORY — PX: ESOPHAGOGASTRODUODENOSCOPY (EGD) WITH PROPOFOL: SHX5813

## 2017-11-21 LAB — GLUCOSE, CAPILLARY
Glucose-Capillary: 131 mg/dL — ABNORMAL HIGH (ref 65–99)
Glucose-Capillary: 128 mg/dL — ABNORMAL HIGH (ref 65–99)
Glucose-Capillary: 116 mg/dL — ABNORMAL HIGH (ref 65–99)

## 2017-11-21 LAB — CBC
HCT: 28.2 % — ABNORMAL LOW (ref 36.0–46.0)
Hemoglobin: 9.4 g/dL — ABNORMAL LOW (ref 12.0–15.0)
MCH: 27.3 pg (ref 26.0–34.0)
MCHC: 33.3 g/dL (ref 30.0–36.0)
MCV: 82 fL (ref 78.0–100.0)
PLATELETS: 306 10*3/uL (ref 150–400)
RBC: 3.44 MIL/uL — AB (ref 3.87–5.11)
RDW: 13.7 % (ref 11.5–15.5)
WBC: 11.1 10*3/uL — ABNORMAL HIGH (ref 4.0–10.5)

## 2017-11-21 LAB — BPAM RBC
BLOOD PRODUCT EXPIRATION DATE: 201906012359
Blood Product Expiration Date: 201905112359
ISSUE DATE / TIME: 201905041617
ISSUE DATE / TIME: 201905041251
UNIT TYPE AND RH: 1700
Unit Type and Rh: 9500

## 2017-11-21 LAB — COMPREHENSIVE METABOLIC PANEL
ALK PHOS: 20 U/L — AB (ref 38–126)
ALT: 14 U/L (ref 14–54)
AST: 25 U/L (ref 15–41)
Albumin: 3.1 g/dL — ABNORMAL LOW (ref 3.5–5.0)
Anion gap: 6 (ref 5–15)
BUN: 49 mg/dL — AB (ref 6–20)
CALCIUM: 8.8 mg/dL — AB (ref 8.9–10.3)
CHLORIDE: 101 mmol/L (ref 101–111)
CO2: 26 mmol/L (ref 22–32)
CREATININE: 0.96 mg/dL (ref 0.44–1.00)
GFR calc non Af Amer: 55 mL/min — ABNORMAL LOW (ref >60)
GLUCOSE: 119 mg/dL — AB (ref 65–99)
Potassium: 4.2 mmol/L (ref 3.5–5.1)
SODIUM: 133 mmol/L — AB (ref 135–145)
Total Bilirubin: 0.8 mg/dL (ref 0.3–1.2)
Total Protein: 5.9 g/dL — ABNORMAL LOW (ref 6.5–8.1)

## 2017-11-21 LAB — TYPE AND SCREEN
ABO/RH(D): B NEG
Antibody Screen: NEGATIVE
UNIT DIVISION: 0
Unit division: 0

## 2017-11-21 LAB — MRSA PCR SCREENING: MRSA BY PCR: POSITIVE — AB

## 2017-11-21 SURGERY — ESOPHAGOGASTRODUODENOSCOPY (EGD) WITH PROPOFOL
Anesthesia: Moderate Sedation

## 2017-11-21 MED ORDER — BUTAMBEN-TETRACAINE-BENZOCAINE 2-2-14 % EX AERO
INHALATION_SPRAY | CUTANEOUS | Status: DC | PRN
  Administered 2017-11-21: 1 via TOPICAL

## 2017-11-21 MED ORDER — SODIUM CHLORIDE 0.9 % IV SOLN
INTRAVENOUS | Status: DC
  Administered 2017-11-21: 500 mL via INTRAVENOUS

## 2017-11-21 MED ORDER — FENTANYL CITRATE (PF) 100 MCG/2ML IJ SOLN
INTRAMUSCULAR | Status: AC
  Filled 2017-11-21: qty 2

## 2017-11-21 MED ORDER — BENZONATATE 100 MG PO CAPS
100.0000 mg | ORAL_CAPSULE | Freq: Two times a day (BID) | ORAL | Status: DC | PRN
  Administered 2017-11-21 – 2017-11-22 (×3): 100 mg via ORAL
  Filled 2017-11-21 (×3): qty 1

## 2017-11-21 MED ORDER — MUPIROCIN 2 % EX OINT
1.0000 "application " | TOPICAL_OINTMENT | Freq: Two times a day (BID) | CUTANEOUS | Status: DC
  Administered 2017-11-21 – 2017-11-23 (×5): 1 via NASAL
  Filled 2017-11-21 (×2): qty 22

## 2017-11-21 MED ORDER — MIDAZOLAM HCL 10 MG/2ML IJ SOLN
INTRAMUSCULAR | Status: DC | PRN
  Administered 2017-11-21 (×3): 1 mg via INTRAVENOUS

## 2017-11-21 MED ORDER — CHLORHEXIDINE GLUCONATE CLOTH 2 % EX PADS
6.0000 | MEDICATED_PAD | Freq: Every day | CUTANEOUS | Status: DC
  Administered 2017-11-21 – 2017-11-23 (×3): 6 via TOPICAL

## 2017-11-21 MED ORDER — FENTANYL CITRATE (PF) 100 MCG/2ML IJ SOLN
INTRAMUSCULAR | Status: DC | PRN
  Administered 2017-11-21: 25 ug via INTRAVENOUS

## 2017-11-21 MED ORDER — MIDAZOLAM HCL 5 MG/ML IJ SOLN
INTRAMUSCULAR | Status: AC
  Filled 2017-11-21: qty 2

## 2017-11-21 SURGICAL SUPPLY — 15 items

## 2017-11-21 NOTE — Op Note (Signed)
[facility] Patient Name: Carla Foster Procedure Date : 11/21/2017 MRN: [id number] Attending MD: Gatha Mayer , MD Date of Birth: [birth year] CSN: [id number] Age: 77 Admit Type: Inpatient Procedure:                Upper GI endoscopy Indications:              Hematemesis, Melena Providers:                Gatha Mayer, MD, Cleda Daub, RN, Charolette Child, Technician Referring MD:              Medicines:                Midazolam 3 mg IV, Fentanyl 25 micrograms IV,                            Cetacaine spray Complications:            No immediate complications. Estimated Blood Loss:     Estimated blood loss was minimal. Procedure:                Pre-Anesthesia Assessment:                           - Prior to the procedure, a History and Physical                            was performed, and patient medications and                            allergies were reviewed. The patient's tolerance of                            previous anesthesia was also reviewed. The risks                            and benefits of the procedure and the sedation                            options and risks were discussed with the patient.                            All questions were answered, and informed consent                            was obtained. Prior Anticoagulants: The patient has                            taken no previous anticoagulant or antiplatelet                            agents. ASA Grade Assessment: III - A patient with  severe systemic disease. After reviewing the risks                            and benefits, the patient was deemed in                            satisfactory condition to undergo the procedure.                           After obtaining informed consent, the endoscope was                            passed under direct vision. Throughout the                            procedure, the patient's blood  pressure, pulse, and                            oxygen saturations were monitored continuously. The                            EG-[id number] ([id number]) scope was introduced through the                            mouth, and advanced to the second part of duodenum.                            The upper GI endoscopy was accomplished without                            difficulty. The patient tolerated the procedure                            well. Scope In: Scope Out: Findings:      One non-bleeding cratered gastric ulcer with no stigmata of bleeding was       found at the pylorus. Biopsies were taken with a cold forceps for       Helicobacter pylori testing using CLOtest. Verification of patient       identification for the specimen was done. Estimated blood loss was       minimal.      A few localized, small non-bleeding erosions were found in the       prepyloric region of the stomach. There were stigmata of recent bleeding.      The exam was otherwise without abnormality.      The cardia and gastric fundus were normal on retroflexion. Impression:               - Non-bleeding gastric ulcer with no stigmata of                            bleeding. Biopsied.                           - Non-bleeding erosive gastropathy.                           -  The examination was otherwise normal. Moderate Sedation:      Moderate (conscious) sedation was administered by the endoscopy nurse       and supervised by the endoscopist. The following parameters were       monitored: oxygen saturation, heart rate, blood pressure, respiratory       rate, EKG, adequacy of pulmonary ventilation, and response to care.       Total physician intraservice time was 9 minutes. Recommendation:           - Return patient to hospital ward for ongoing care.                           - Clear liquid diet.                           - May advance diet if tolerated                           I have ordered that pantoprazole infusion  stop when                            bag is empty then start oral daily PPI                           Treat H pylori if + though she has been taking                            ibuprofen for back pain and I suspect that was                            cause but may also have H pylori                           Dr. Collene Mares to assume GI care tomorrow Procedure Code(s):        --- Professional ---                           [phone], Esophagogastroduodenoscopy, flexible,                            transoral; with biopsy, single or multiple Diagnosis Code(s):        --- Professional ---                           K25.9, Gastric ulcer, unspecified as acute or                            chronic, without hemorrhage or perforation                           K31.89, Other diseases of stomach and duodenum                           K92.0, Hematemesis  K92.1, Melena (includes Hematochezia) CPT copyright 2017 American Medical Association. All rights reserved. The codes documented in this report are preliminary and upon coder review may  be revised to meet current compliance requirements. Gatha Mayer, MD 11/21/2017 9:13:27 AM This report has been signed electronically. Number of Addenda: 0

## 2017-11-21 NOTE — Interval H&P Note (Signed)
History and Physical Interval Note:  11/21/2017 8:46 AM  Carla Foster  has presented today for surgery, with the diagnosis of Upper GI bleed  The various methods of treatment have been discussed with the patient and family. After consideration of risks, benefits and other options for treatment, the patient has consented to  Procedure(s): ESOPHAGOGASTRODUODENOSCOPY (EGD) WITH PROPOFOL (N/A) as a surgical intervention .  The patient's history has been reviewed, patient examined, no change in status, stable for surgery.  I have reviewed the patient's chart and labs.  Questions were answered to the patient's satisfaction.    She has responded well to transfusion.   Silvano Rusk

## 2017-11-21 NOTE — Progress Notes (Signed)
PROGRESS NOTE  Carla Foster YJE:[id number] DOB: 07-Apr-1941 DOA: 11/20/2017 PCP: Christain Sacramento, MD  HPI  Carla Foster is a 77 y.o. year old female with medical history significant for diabetes, asthma, hyperlipidemia and hypertension who presented on 11/20/2017 with 4 days of worsening fatigue and dyspnea on exertion followed by acute episode of bright red blood emesis and 3 episodes of dark tarry stools and was found to have acute blood loss anemia related to hematemesis and melena.  Interval History  No acute events overnight  ROS:    Subjective Doing well after EGD.  Ready to try diet.  Assessment/Plan: Principal Problem:   Acute blood loss anemia Active Problems:   HTN (hypertension)   Hyperlipidemia   Hematemesis   Melena   GI bleed   Hyponatremia   AKI (acute kidney injury) ([place])   #Hematemesis and melena secondary to pyloric ulcer.  GI bleed (upper)in  setting of NSAIDs .  Nonbleeding gastric ulcer on EGD.  Transition from IV Protonix drip to oral PPI, appreciate GI  recommendations, clear liquid diet, advance as tolerated, follow-up H. pylori biopsy.  Hemodynamically stable, hold home amlodipine and HCTZ and continue gentle hydration  #Symptomatic acute blood loss anemia, improving.  Hemoglobin currently 9 (nadir 6.4) after 2 units packed red blood cell transfusion overnight.   serial CBCs.  #Acute hyponatremia, hypovolemic, mild.  Resolved with gentle IV fluids.  Monitor BMP  #Hypertension.  Normotensive.  Holding home amlodipine, lisinopril and HCTZ in setting of blood loss and avoiding hypotension  #Type 2 diabetes, A1c 6.8.  Hold home metformin.  Monitor CBG, sliding scale as needed.  #Asthma.  No wheezing on exam.  PRN albuterol  #Hyperlipidemia.  Stable continue home simvastatin  Code Status: Full code  Family Communication: Grandson updated at bedside  Disposition Plan: Monitor CBC over 24 hours, IV Protonix drip transition to oral PPI, follow-up H.  pylori results, awaiting further GI recommendations   Consultants:  GI  Procedures:  EGD, 5/5: Nonbleeding gastric ulcer, nonbleeding erosive gastropathy  Antimicrobials:  None  Cultures:  None  Telemetry:  DVT prophylaxis: SCDs   Objective: Vitals:   11/20/17 1955 11/20/17 2040 11/20/17 2349 11/21/17 0420  BP: (!) 150/53  (!) 156/53 (!) 153/59  Pulse: 93  92 96  Resp: (!) 23  18 18   Temp: 98.3 F (36.8 C) 98 F (36.7 C) 97.9 F (36.6 C) 98.2 F (36.8 C)  TempSrc: Oral Oral Oral Oral  SpO2: 97%  99% 97%  Weight:      Height:        Intake/Output Summary (Last 24 hours) at 11/21/2017 0725 Last data filed at 11/21/2017 6378 Gross per 24 hour  Intake 2403.17 ml  Output 2500 ml  Net -96.83 ml   Filed Weights   11/20/17 1114 11/20/17 1535  Weight: 68 kg (150 lb) 67.8 kg (149 lb 7.6 oz)    Exam:  Constitutional:normal appearing female ENMT: Oropharynx with moist mucous membranes, normal dentition Cardiovascular: RRR no MRGs, with no peripheral edema Respiratory: Normal respiratory effort on room air, clear breath sounds  Abdomen: Soft,non-tender, with no HSM Skin: No rash ulcers, or lesions. Without skin tenting  Neurologic: Grossly no focal neuro deficit. Psychiatric:Appropriate affect, and mood. Mental status AAOx3  Data Reviewed: CBC: Recent Labs  Lab 11/20/17 1124 11/20/17 2047 11/21/17 0329  WBC 9.8 10.3 11.1*  NEUTROABS 8.4*  --   --   HGB 6.4* 9.1* 9.4*  HCT 19.3* 27.0* 28.2*  MCV  81.8 81.8 82.0  PLT 316 273 235   Basic Metabolic Panel: Recent Labs  Lab 11/20/17 1124 11/21/17 0329  NA 126* 133*  K 4.7 4.2  CL 94* 101  CO2 23 26  GLUCOSE 233* 119*  BUN 70* 49*  CREATININE 1.26* 0.96  CALCIUM 8.7* 8.8*   GFR: Estimated Creatinine Clearance: 45.2 mL/min (by C-G formula based on SCr of 0.96 mg/dL). Liver Function Tests: Recent Labs  Lab 11/20/17 1124 11/21/17 0329  AST 22 25  ALT 15 14  ALKPHOS 17* 20*  BILITOT 0.5 0.8    PROT 5.6* 5.9*  ALBUMIN 2.9* 3.1*   No results for input(s): LIPASE, AMYLASE in the last 168 hours. No results for input(s): AMMONIA in the last 168 hours. Coagulation Profile: Recent Labs  Lab 11/20/17 1124  INR 1.17   Cardiac Enzymes: No results for input(s): CKTOTAL, CKMB, CKMBINDEX, TROPONINI in the last 168 hours. BNP (last 3 results) No results for input(s): PROBNP in the last 8760 hours. HbA1C: Recent Labs    11/20/17 1145  HGBA1C 6.8*   CBG: Recent Labs  Lab 11/20/17 1715 11/20/17 2122  GLUCAP 153* 113*   Lipid Profile: No results for input(s): CHOL, HDL, LDLCALC, TRIG, CHOLHDL, LDLDIRECT in the last 72 hours. Thyroid Function Tests: No results for input(s): TSH, T4TOTAL, FREET4, T3FREE, THYROIDAB in the last 72 hours. Anemia Panel: No results for input(s): VITAMINB12, FOLATE, FERRITIN, TIBC, IRON, RETICCTPCT in the last 72 hours. Urine analysis: No results found for: COLORURINE, APPEARANCEUR, LABSPEC, PHURINE, GLUCOSEU, HGBUR, BILIRUBINUR, KETONESUR, PROTEINUR, UROBILINOGEN, NITRITE, LEUKOCYTESUR Sepsis Labs: @LABRCNTIP (procalcitonin:4,lacticidven:4)  ) Recent Results (from the past 240 hour(s))  MRSA PCR Screening     Status: Abnormal   Collection Time: 11/20/17 11:42 PM  Result Value Ref Range Status   MRSA by PCR POSITIVE (A) NEGATIVE Final    Comment:        The GeneXpert MRSA Assay (FDA approved for NASAL specimens only), is one component of a comprehensive MRSA colonization surveillance program. It is not intended to diagnose MRSA infection nor to guide or monitor treatment for MRSA infections. RESULT CALLED TO, READ BACK BY AND VERIFIED WITH: SEXTON,PJ RN 0510 11/21/17 MITCHELL,L Performed at [facility], [place] [address], [place], [place] [address]       Studies: No results found.  Scheduled Meds: . carvedilol  25 mg Oral BID  . Chlorhexidine Gluconate Cloth  6 each Topical Q0600  . insulin aspart  0-15 Units Subcutaneous TID  WC  . insulin aspart  0-5 Units Subcutaneous QHS  . mupirocin ointment  1 application Nasal BID    Continuous Infusions: . sodium chloride 50 mL/hr at 11/20/17 1931  . pantoprozole (PROTONIX) infusion 8 mg/hr (11/21/17 0128)     LOS: 1 day     Desiree Hane, MD [facility] Pager [phone]  If 7PM-7AM, please contact night-coverage [email or web address] Password [email or web address] 11/21/2017, 7:25 AM

## 2017-11-21 NOTE — Progress Notes (Signed)
RN paged C. Bodenheimer, NP to make him aware that patient is positive for MRSA in nares, initiating contact precautions and MRSA order set.  P.J. Linus Mako, RN

## 2017-11-22 ENCOUNTER — Encounter (HOSPITAL_COMMUNITY): Payer: Self-pay | Admitting: Internal Medicine

## 2017-11-22 DIAGNOSIS — K922 Gastrointestinal hemorrhage, unspecified: Secondary | ICD-10-CM

## 2017-11-22 DIAGNOSIS — K920 Hematemesis: Secondary | ICD-10-CM

## 2017-11-22 LAB — CBC
HCT: 27.3 % — ABNORMAL LOW (ref 36.0–46.0)
Hemoglobin: 9.1 g/dL — ABNORMAL LOW (ref 12.0–15.0)
MCH: 27.7 pg (ref 26.0–34.0)
MCHC: 33.3 g/dL (ref 30.0–36.0)
MCV: 83.2 fL (ref 78.0–100.0)
PLATELETS: 281 10*3/uL (ref 150–400)
RBC: 3.28 MIL/uL — AB (ref 3.87–5.11)
RDW: 13.9 % (ref 11.5–15.5)
WBC: 10.2 10*3/uL (ref 4.0–10.5)

## 2017-11-22 LAB — EXPECTORATED SPUTUM ASSESSMENT W GRAM STAIN, RFLX TO RESP C

## 2017-11-22 LAB — GLUCOSE, CAPILLARY
Glucose-Capillary: 153 mg/dL — ABNORMAL HIGH (ref 65–99)
Glucose-Capillary: 90 mg/dL (ref 65–99)
Glucose-Capillary: 179 mg/dL — ABNORMAL HIGH (ref 65–99)
Glucose-Capillary: 120 mg/dL — ABNORMAL HIGH (ref 65–99)

## 2017-11-22 LAB — EXPECTORATED SPUTUM ASSESSMENT W REFEX TO RESP CULTURE

## 2017-11-22 LAB — CLOTEST (H. PYLORI), BIOPSY: Helicobacter screen: NEGATIVE

## 2017-11-22 MED ORDER — AMLODIPINE BESYLATE 5 MG PO TABS
5.0000 mg | ORAL_TABLET | Freq: Every day | ORAL | Status: DC
  Administered 2017-11-22 – 2017-11-23 (×2): 5 mg via ORAL
  Filled 2017-11-22 (×2): qty 1

## 2017-11-22 MED ORDER — PANTOPRAZOLE SODIUM 40 MG PO TBEC: 40.0000 mg | DELAYED_RELEASE_TABLET | Freq: Every day | ORAL | Status: DC

## 2017-11-22 MED ORDER — SODIUM CHLORIDE 0.9 % IV SOLN
8.0000 mg/h | INTRAVENOUS | Status: DC
  Administered 2017-11-22 (×3): 8 mg/h via INTRAVENOUS
  Filled 2017-11-22 (×2): qty 80
  Filled 2017-11-22: qty 40
  Filled 2017-11-22 (×2): qty 80

## 2017-11-22 NOTE — Progress Notes (Signed)
Subjective: Patient seems to be doing much better today. Results of the EGD noted. She denies having any abdominal pain, nausea or vomiting. She denies having any melena or hematochezia. She has a good appetite and her weight has been stable.   Objective: Vital signs in last 24 hours: Temp:  [97.8 F (36.6 C)-98.4 F (36.9 C)] 98 F (36.7 C) (05/06 1606) Pulse Rate:  [75-90] 90 (05/06 1606) Resp:  [18-24] 18 (05/06 1606) BP: (142-173)/(55-64) 173/64 (05/06 1606) SpO2:  [95 %-100 %] 99 % (05/06 1606) Last BM Date: 11/21/17  Intake/Output from previous day: 05/05 0701 - 05/06 0700 In: 260 [P.O.:260] Out: 500 [Urine:500] Intake/Output this shift: Total I/O In: 480 [P.O.:480] Out: -   General appearance: alert, cooperative and no distress Resp: clear to auscultation bilaterally Cardio: regular rate and rhythm, S1, S2 normal, no murmur, click, rub or gallop GI: soft, non-tender; bowel sounds normal; no masses,  no organomegaly Extremities: extremities normal, atraumatic, no cyanosis or edema  Lab Results: Recent Labs    11/20/17 2047 11/21/17 0329 11/22/17 0345  WBC 10.3 11.1* 10.2  HGB 9.1* 9.4* 9.1*  HCT 27.0* 28.2* 27.3*  PLT 273 306 281   BMET Recent Labs    11/20/17 1124 11/21/17 0329  NA 126* 133*  K 4.7 4.2  CL 94* 101  CO2 23 26  GLUCOSE 233* 119*  BUN 70* 49*  CREATININE 1.26* 0.96  CALCIUM 8.7* 8.8*   LFT Recent Labs    11/21/17 0329  PROT 5.9*  ALBUMIN 3.1*  AST 25  ALT 14  ALKPHOS 20*  BILITOT 0.8   PT/INR Recent Labs    11/20/17 1124  LABPROT 14.8  INR 1.17   Medications: I have reviewed the patient's current medications.  Assessment/Plan: 1) Plyoric channel ulcer with anemia and hematemesis-stable post EGD. Patient can be discharged tomorrow. I will see her in follow up and check on her biopsies as well. Patient has been advised to stop her Aspirin and stop taking all NSAIDS. She should continue taking the PPI's 2) ABLA-Improved  after 2 units of PBC's. 3) AKI-creatinine has improved today.   LOS: 2 days     Xayden Linsey 11/22/2017, 6:07 PM

## 2017-11-22 NOTE — Progress Notes (Signed)
PROGRESS NOTE  Carla Foster OQH:[id number] DOB: 1940/12/12 DOA: 11/20/2017 PCP: Christain Sacramento, MD  HPI  Carla Foster is a 77 y.o. year old female with medical history significant for diabetes, asthma, hyperlipidemia and hypertension who presented on 11/20/2017 with 4 days of worsening fatigue and dyspnea on exertion followed by acute episode of bright red blood emesis and 3 episodes of dark tarry stools and was found to have acute blood loss anemia related to hematemesis and melena.  Interval History  No acute events overnight  ROS: Denies any abdominal pain, chest pain, shortness of breath.    Subjective Ready to try regular diet  Assessment/Plan: Principal Problem:   Acute blood loss anemia Active Problems:   HTN (hypertension)   Hyperlipidemia   Hematemesis   Melena   GI bleed   Hyponatremia   AKI (acute kidney injury) ([place])   Acute pyloric channel ulcer   #Hematemesis and melena secondary to pyloric ulcer, resolved.  Likely NSAID related upper GI bleed.  Nonbleeding gastric ulcer on EGD.  Will likely transition from IV Protonix drip to oral PPI awaiting recommendations from primary gastroenterologist (Dr. Collene Mares), appreciate GI  recommendations, advance to regular diet, advance as tolerated, follow-up H. pylori biopsy.    #Symptomatic acute blood loss anemia, stable.  Hemoglobin remained stable at 9 (nadir 6.4).  Previously required 2 units blood transfusion on admission.  Monitor CBC  #Acute hyponatremia, hypovolemic, mild.  Resolved with gentle IV fluids.  Monitor BMP  #Hypertension.  Slightly elevated, SBP to 160s.  Will resume home amlodipine first.  Monitor consider adding additional lisinopril and HCTZ as needed.  Previously held in setting of blood loss to prevent hypotension.    #Type 2 diabetes, A1c 6.8.  Hold home metformin.  Monitor CBG, sliding scale as needed.  #Asthma.  No wheezing on exam.  Persistent cough, likely related to allergy/pollen.  PRN Tessalon  Perles, PRN albuterol  #Hyperlipidemia.  Stable continue home simvastatin  Code Status: Full code  Family Communication: Daughters updated at bedside  Disposition Plan: Monitor CBC , awaiting further GI recommendations, still on IV PPI   Consultants:  GI  Procedures:  EGD, 5/5: Nonbleeding gastric ulcer, nonbleeding erosive gastropathy  Antimicrobials:  None  Cultures:  None  Telemetry:  DVT prophylaxis: SCDs   Objective: Vitals:   11/22/17 0425 11/22/17 0445 11/22/17 0824 11/22/17 1215  BP: (!) 158/55  (!) 169/62 (!) 142/56  Pulse: 79  81 75  Resp: (!) 21  19 (!) 24  Temp: 97.8 F (36.6 C) 97.8 F (36.6 C) 98.1 F (36.7 C) 97.9 F (36.6 C)  TempSrc: Oral Oral Oral Oral  SpO2: 95%  100% 100%  Weight:      Height:        Intake/Output Summary (Last 24 hours) at 11/22/2017 1557 Last data filed at 11/22/2017 0949 Gross per 24 hour  Intake 480 ml  Output 200 ml  Net 280 ml   Filed Weights   11/20/17 1114 11/20/17 1535  Weight: 68 kg (150 lb) 67.8 kg (149 lb 7.6 oz)    Exam:  Constitutional:normal appearing female ENMT: Oropharynx with moist mucous membranes, normal dentition Cardiovascular: RRR no MRGs, with no peripheral edema Respiratory: Normal respiratory effort on room air, clear breath sounds  Abdomen: Soft,non-tender, with no HSM Skin: No rash ulcers, or lesions. Without skin tenting  Neurologic: Grossly no focal neuro deficit. Psychiatric:Appropriate affect, and mood. Mental status AAOx3  Data Reviewed: CBC: Recent Labs  Lab 11/20/17  1124 11/20/17 2047 11/21/17 0329 11/22/17 0345  WBC 9.8 10.3 11.1* 10.2  NEUTROABS 8.4*  --   --   --   HGB 6.4* 9.1* 9.4* 9.1*  HCT 19.3* 27.0* 28.2* 27.3*  MCV 81.8 81.8 82.0 83.2  PLT 316 273 306 468   Basic Metabolic Panel: Recent Labs  Lab 11/20/17 1124 11/21/17 0329  NA 126* 133*  K 4.7 4.2  CL 94* 101  CO2 23 26  GLUCOSE 233* 119*  BUN 70* 49*  CREATININE 1.26* 0.96  CALCIUM 8.7*  8.8*   GFR: Estimated Creatinine Clearance: 45.2 mL/min (by C-G formula based on SCr of 0.96 mg/dL). Liver Function Tests: Recent Labs  Lab 11/20/17 1124 11/21/17 0329  AST 22 25  ALT 15 14  ALKPHOS 17* 20*  BILITOT 0.5 0.8  PROT 5.6* 5.9*  ALBUMIN 2.9* 3.1*   No results for input(s): LIPASE, AMYLASE in the last 168 hours. No results for input(s): AMMONIA in the last 168 hours. Coagulation Profile: Recent Labs  Lab 11/20/17 1124  INR 1.17   Cardiac Enzymes: No results for input(s): CKTOTAL, CKMB, CKMBINDEX, TROPONINI in the last 168 hours. BNP (last 3 results) No results for input(s): PROBNP in the last 8760 hours. HbA1C: Recent Labs    11/20/17 1145  HGBA1C 6.8*   CBG: Recent Labs  Lab 11/21/17 1200 11/21/17 1720 11/21/17 2229 11/22/17 0835 11/22/17 1214  GLUCAP 131* 128* 116* 153* 90   Lipid Profile: No results for input(s): CHOL, HDL, LDLCALC, TRIG, CHOLHDL, LDLDIRECT in the last 72 hours. Thyroid Function Tests: No results for input(s): TSH, T4TOTAL, FREET4, T3FREE, THYROIDAB in the last 72 hours. Anemia Panel: No results for input(s): VITAMINB12, FOLATE, FERRITIN, TIBC, IRON, RETICCTPCT in the last 72 hours. Urine analysis: No results found for: COLORURINE, APPEARANCEUR, LABSPEC, [place], GLUCOSEU, HGBUR, BILIRUBINUR, KETONESUR, PROTEINUR, UROBILINOGEN, NITRITE, LEUKOCYTESUR Sepsis Labs: @LABRCNTIP (procalcitonin:4,lacticidven:4)  ) Recent Results (from the past 240 hour(s))  MRSA PCR Screening     Status: Abnormal   Collection Time: 11/20/17 11:42 PM  Result Value Ref Range Status   MRSA by PCR POSITIVE (A) NEGATIVE Final    Comment:        The GeneXpert MRSA Assay (FDA approved for NASAL specimens only), is one component of a comprehensive MRSA colonization surveillance program. It is not intended to diagnose MRSA infection nor to guide or monitor treatment for MRSA infections. RESULT CALLED TO, READ BACK BY AND VERIFIED WITH: SEXTON,PJ RN  0510 11/21/17 MITCHELL,L Performed at [facility], [place] [address], [place], [place] [address]   Culture, expectorated sputum-assessment     Status: None   Collection Time: 11/22/17 11:45 AM  Result Value Ref Range Status   Specimen Description SPUTUM  Final   Special Requests NONE  Final   Sputum evaluation   Final    Sputum specimen not acceptable for testing.  Please recollect.   Gram Stain Report Called to,Read Back By and Verified With: E CHEEK,RN AT 1248 11/22/17 BY L BENFIELD Performed at [facility], [place] [address] [place], [place] [address]    Report Status 11/22/2017 FINAL  Final      Studies: No results found.  Scheduled Meds: . carvedilol  25 mg Oral BID  . Chlorhexidine Gluconate Cloth  6 each Topical Q0600  . insulin aspart  0-15 Units Subcutaneous TID WC  . insulin aspart  0-5 Units Subcutaneous QHS  . mupirocin ointment  1 application Nasal BID  . [START ON 11/24/2017] pantoprazole  40 mg Oral QAC breakfast    Continuous Infusions: . pantoprozole (PROTONIX) infusion 8 mg/hr (11/22/17 1119)     LOS: 2 days     Desiree Hane, MD [facility] Pager [phone]  If 7PM-7AM, please contact night-coverage [email or web address] Password [facility] 11/22/2017, 3:57 PM

## 2017-11-23 ENCOUNTER — Encounter (HOSPITAL_COMMUNITY): Payer: Self-pay | Admitting: General Practice

## 2017-11-23 ENCOUNTER — Other Ambulatory Visit: Payer: Self-pay

## 2017-11-23 DIAGNOSIS — N179 Acute kidney failure, unspecified: Secondary | ICD-10-CM

## 2017-11-23 DIAGNOSIS — E785 Hyperlipidemia, unspecified: Secondary | ICD-10-CM

## 2017-11-23 DIAGNOSIS — D649 Anemia, unspecified: Secondary | ICD-10-CM

## 2017-11-23 DIAGNOSIS — E871 Hypo-osmolality and hyponatremia: Secondary | ICD-10-CM

## 2017-11-23 LAB — GLUCOSE, CAPILLARY: Glucose-Capillary: 143 mg/dL — ABNORMAL HIGH (ref 65–99)

## 2017-11-23 MED ORDER — BENZONATATE 100 MG PO CAPS: 100.0000 mg | ORAL_CAPSULE | Freq: Two times a day (BID) | ORAL | 0 refills | Status: DC | PRN

## 2017-11-23 MED ORDER — PANTOPRAZOLE SODIUM 40 MG PO TBEC: 40.0000 mg | DELAYED_RELEASE_TABLET | Freq: Every day | ORAL | 0 refills | Status: DC

## 2017-11-23 NOTE — Discharge Summary (Signed)
Discharge Summary  Carla Foster VZC:[id number] DOB: February 19, 1941  PCP: Christain Sacramento, MD  Admit date: 11/20/2017 Discharge date: 11/23/2017   Time spent: < 25 minutes  Admitted From: Home Disposition: Home  Recommendations for Outpatient Follow-up:  1. Follow up with PCP in 1-2 weeks 2. Avoid further NSAID use.  Continue oral PPI daily on discharge   Home Health: No Equipment/Devices: None  Discharge Diagnoses:  Active Hospital Problems   Diagnosis Date Noted  . Acute blood loss anemia   . Acute pyloric channel ulcer   . Hematemesis 11/20/2017  . Melena 11/20/2017  . Hyponatremia 11/20/2017  . GI bleed   . AKI (acute kidney injury) ([place])   . HTN (hypertension) 01/23/2011  . Hyperlipidemia 01/23/2011    Resolved Hospital Problems  No resolved problems to display.    Discharge Condition: Stable  CODE STATUS: Full Diet recommendation: Carb modified  Vitals:   11/23/17 0454 11/23/17 0807  BP:  (!) 162/67  Pulse:  93  Resp:  (!) 26  Temp: 97.7 F (36.5 C) 97.9 F (36.6 C)  SpO2:  96%    History of present illness:  Carla Foster is a 77 y.o. year old female with medical history significant for diabetes, asthma, hyperlipidemia and hypertension who presented on 11/20/2017 with 4 days of worsening fatigue and dyspnea on exertion followed by acute episode of bright red blood emesis and 3 episodes of dark tarry stools and was found to have acute blood loss anemia related to hematemesis and melena.  Patient was found to have nonbleeding pyloric ulcer on EGD. Marland Kitchen Remaining hospital course addressed in problem based format below:   Hospital Course:  Principal Problem:   Acute blood loss anemia Active Problems:   HTN (hypertension)   Hyperlipidemia   Hematemesis   Melena   GI bleed   Hyponatremia   AKI (acute kidney injury) ([place])   Acute pyloric channel ulcer  1. Hematemesis and melena secondary to pyloric ulcer, resolved.  Presumed secondary to NSAID use.   Nonbleeding gastric ulcer found on EGD with no bleeding stigmata.  Biopsy negative for H. pylori.  Patient was treated with IV Protonix drip followed by transition to oral PPI.  Patient will continue PPI on discharge per recommendations from her primary gastroenterologist.  Patient tolerated regular diet prior to discharge.  Patient instructed to avoid further NSAID use  2. Symptomatic acute blood loss anemia, secondary to #1.  Hemoglobin 6.4 on admission required 2 units packed red blood cell transfusion.  Hemoglobin remained stable at 9 for the remainder of admission.  3. Acute hypovolemic hyponatremia, mild, resolved with gentle IV fluids.  4. Hypertension.  Slightly elevated SBP to 160s in the first 24 hours of admission while  holding patient's home BP medicines in the setting of acute blood loss anemia to prevent hypotension.  Prior to discharge her home regimen was resumed.  5. Asthma.  No flare throughout admission.  Patient did have persistent cough presumed related to allergies.  Improved with supportive care with Tessalon Perles.  Antibiotics: None  Microbiology: None  Consultations:  Gastroenterology   Procedures/Studies:  EGD, 5/5: Nonbleeding gastric ulcer, nonbleeding erosive gastropathy  No results found.   Discharge Exam: BP (!) 162/67 (BP Location: Right Arm) Comment (BP Location): Simultaneous filing. User may not have seen previous data.  Pulse 93   Temp 97.9 F (36.6 C) (Oral)   Resp (!) 26   Ht 5\' 6"  (1.676 m)   Wt 67.8 kg (149  lb 7.6 oz)   SpO2 96%   BMI 24.13 kg/m   Constitutional:normal appearing female ENMT: Oropharynx with moist mucous membranes, normal dentition Cardiovascular: RRR no MRGs, with no peripheral edema Respiratory: Normal respiratory effort on room air, clear breath sounds  Abdomen: Soft,non-tender, with no HSM Skin: No rash ulcers, or lesions. Without skin tenting  Neurologic: Grossly no focal neuro  deficit. Psychiatric:Appropriate affect, and mood. Mental status AAOx3  Discharge Instructions You were cared for by a hospitalist during your hospital stay. If you have any questions about your discharge medications or the care you received while you were in the hospital after you are discharged, you can call the unit and asked to speak with the hospitalist on call if the hospitalist that took care of you is not available. Once you are discharged, your primary care physician will handle any further medical issues. Please note that NO REFILLS for any discharge medications will be authorized once you are discharged, as it is imperative that you return to your primary care physician (or establish a relationship with a primary care physician if you do not have one) for your aftercare needs so that they can reassess your need for medications and monitor your lab values.  Discharge Instructions    Diet - low sodium heart healthy   Complete by:  As directed    Increase activity slowly   Complete by:  As directed      Allergies as of 11/23/2017      Reactions   Tiazac [diltiazem Hcl]    Ankle edema      Medication List    STOP taking these medications   aspirin 81 MG tablet     TAKE these medications   albuterol 108 (90 Base) MCG/ACT inhaler Commonly known as:  PROVENTIL HFA;VENTOLIN HFA Inhale 2 puffs into the lungs as directed.   amLODipine 5 MG tablet Commonly known as:  NORVASC Take 1 tablet (5 mg total) by mouth daily.   benzonatate 100 MG capsule Commonly known as:  TESSALON Take 1 capsule (100 mg total) by mouth 2 (two) times daily as needed for cough.   CALCIUM 600 + D PO Take 1 tablet by mouth daily.   calcium carbonate 500 MG chewable tablet Commonly known as:  TUMS - dosed in mg elemental calcium Chew 1-2 tablets by mouth as needed for indigestion or heartburn.   carvedilol 25 MG tablet Commonly known as:  COREG Take 1 tablet (25 mg total) by mouth 2 (two) times  daily.   cholecalciferol 1000 units tablet Commonly known as:  VITAMIN D Take 1,000 Units by mouth daily.   doxazosin 2 MG tablet Commonly known as:  CARDURA Take 1 tablet (2 mg total) by mouth at bedtime.   fenofibrate 160 MG tablet TAKE 1 TABLET BY MOUTH EVERY DAY   fish oil-omega-3 fatty acids 1000 MG capsule Take 1 g by mouth daily.   FLAX SEED OIL PO Take 1 capsule by mouth daily.   guaiFENesin 600 MG 12 hr tablet Commonly known as:  MUCINEX Take 600 mg by mouth 2 (two) times daily as needed for cough or to loosen phlegm.   hydrochlorothiazide 25 MG tablet Commonly known as:  HYDRODIURIL Take 25 mg by mouth as needed.   ibuprofen 600 MG tablet Commonly known as:  ADVIL,MOTRIN Take 600 mg by mouth every 6 (six) hours as needed.   lisinopril 40 MG tablet Commonly known as:  PRINIVIL,ZESTRIL TAKE 1 TABLET BY MOUTH EVERY DAY  metFORMIN 1000 MG tablet Commonly known as:  GLUCOPHAGE Take 1,000 mg by mouth daily.   pantoprazole 40 MG tablet Commonly known as:  PROTONIX Take 1 tablet (40 mg total) by mouth daily before breakfast. Start taking on:  11/24/2017   simvastatin 20 MG tablet Commonly known as:  ZOCOR Take 1 tablet (20 mg total) by mouth at bedtime.   SYSTANE BALANCE 0.6 % Soln Generic drug:  Propylene Glycol Apply 1 drop to eye as needed (dry eyes).   vitamin A 8000 UNIT capsule Take 1 capsule by mouth daily.   vitamin C 500 MG tablet Commonly known as:  ASCORBIC ACID Take 500 mg by mouth daily.      Allergies  Allergen Reactions  . Tiazac [Diltiazem Hcl]     Ankle edema      The results of significant diagnostics from this hospitalization (including imaging, microbiology, ancillary and laboratory) are listed below for reference.    Significant Diagnostic Studies: No results found.  Microbiology: Recent Results (from the past 240 hour(s))  MRSA PCR Screening     Status: Abnormal   Collection Time: 11/20/17 11:42 PM  Result Value Ref  Range Status   MRSA by PCR POSITIVE (A) NEGATIVE Final    Comment:        The GeneXpert MRSA Assay (FDA approved for NASAL specimens only), is one component of a comprehensive MRSA colonization surveillance program. It is not intended to diagnose MRSA infection nor to guide or monitor treatment for MRSA infections. RESULT CALLED TO, READ BACK BY AND VERIFIED WITH: SEXTON,PJ RN 0510 11/21/17 MITCHELL,L Performed at [facility], [place] [address] [place], [place] [address]   Culture, expectorated sputum-assessment     Status: None   Collection Time: 11/22/17 11:45 AM  Result Value Ref Range Status   Specimen Description SPUTUM  Final   Special Requests NONE  Final   Sputum evaluation   Final    Sputum specimen not acceptable for testing.  Please recollect.   Gram Stain Report Called to,Read Back By and Verified With: E CHEEK,RN AT 1248 11/22/17 BY L BENFIELD Performed at [facility], [place] [address], [place], [place] [address]    Report Status 11/22/2017 FINAL  Final     Labs: Basic Metabolic Panel: Recent Labs  Lab 11/20/17 1124 11/21/17 0329  NA 126* 133*  K 4.7 4.2  CL 94* 101  CO2 23 26  GLUCOSE 233* 119*  BUN 70* 49*  CREATININE 1.26* 0.96  CALCIUM 8.7* 8.8*   Liver Function Tests: Recent Labs  Lab 11/20/17 1124 11/21/17 0329  AST 22 25  ALT 15 14  ALKPHOS 17* 20*  BILITOT 0.5 0.8  PROT 5.6* 5.9*  ALBUMIN 2.9* 3.1*   No results for input(s): LIPASE, AMYLASE in the last 168 hours. No results for input(s): AMMONIA in the last 168 hours. CBC: Recent Labs  Lab 11/20/17 1124 11/20/17 2047 11/21/17 0329 11/22/17 0345  WBC 9.8 10.3 11.1* 10.2  NEUTROABS 8.4*  --   --   --   HGB 6.4* 9.1* 9.4* 9.1*  HCT 19.3* 27.0* 28.2* 27.3*  MCV 81.8 81.8 82.0 83.2  PLT 316 273 306 281   Cardiac Enzymes: No results for input(s): CKTOTAL, CKMB, CKMBINDEX, TROPONINI in the last 168 hours. BNP: BNP (last 3 results) No results for input(s): BNP in the last  8760 hours.  ProBNP (last 3 results) No results for input(s): PROBNP in the last 8760 hours.  CBG: Recent Labs  Lab 11/22/17 0835 11/22/17 1214 11/22/17 1747 11/22/17 2122 11/23/17 0808  GLUCAP 153* 90 179* 120* 143*       Signed:  Desiree Hane, MD [facility] 11/23/2017, 10:08 PM

## 2017-11-23 NOTE — Progress Notes (Signed)
Pt BP is 170/75. MD S. Nettey made aware. Will continue to assess.

## 2017-11-23 NOTE — Progress Notes (Signed)
Carla Foster to be D/C'd Home per MD order.  Discussed with the patient and all questions fully answered.  VSS, Skin clean, dry and intact without evidence of skin break down, no evidence of skin tears noted. IV catheter discontinued intact. Site without signs and symptoms of complications. Dressing and pressure applied.  An After Visit Summary was printed and given to the patient. Patient received prescription.  D/c education completed with patient/family including follow up instructions, medication list, d/c activities limitations if indicated, with other d/c instructions as indicated by MD - patient able to verbalize understanding, all questions fully answered.   Patient instructed to return to ED, call 911, or call MD for any changes in condition.   Patient getting dressed in room with daughter and volunteers called to wheel pt to the exit.  [place] 11/23/2017 11:20 AM

## 2017-11-23 NOTE — Plan of Care (Signed)
Pt responding well to treatment no new complain

## 2018-01-11 ENCOUNTER — Ambulatory Visit: Payer: Medicare Other | Admitting: Cardiovascular Disease

## 2018-01-11 ENCOUNTER — Encounter: Payer: Self-pay | Admitting: Cardiovascular Disease

## 2018-01-11 ENCOUNTER — Other Ambulatory Visit: Payer: Medicare Other

## 2018-01-11 ENCOUNTER — Encounter (INDEPENDENT_AMBULATORY_CARE_PROVIDER_SITE_OTHER): Payer: Self-pay

## 2018-01-11 VITALS — BP 152/68 | HR 72 | Ht 66.0 in | Wt 146.0 lb

## 2018-01-11 DIAGNOSIS — T464X5A Adverse effect of angiotensin-converting-enzyme inhibitors, initial encounter: Secondary | ICD-10-CM

## 2018-01-11 DIAGNOSIS — I10 Essential (primary) hypertension: Secondary | ICD-10-CM

## 2018-01-11 DIAGNOSIS — E782 Mixed hyperlipidemia: Secondary | ICD-10-CM

## 2018-01-11 DIAGNOSIS — R05 Cough: Secondary | ICD-10-CM | POA: Diagnosis not present

## 2018-01-11 LAB — LIPID PANEL
CHOLESTEROL TOTAL: 131 mg/dL (ref 100–199)
Chol/HDL Ratio: 2.3 ratio (ref 0.0–4.4)
HDL: 58 mg/dL (ref >39)
LDL Calculated: 57 mg/dL (ref 0–99)
TRIGLYCERIDES: 78 mg/dL (ref 0–149)
VLDL CHOLESTEROL CAL: 16 mg/dL (ref 5–40)

## 2018-01-11 MED ORDER — LOSARTAN POTASSIUM 100 MG PO TABS: 100.0000 mg | ORAL_TABLET | Freq: Every day | ORAL | 11 refills | Status: DC

## 2018-01-11 NOTE — Patient Instructions (Signed)
Medication Instructions:  Your physician has recommended you make the following change in your medication:   STOP Lisinopril START Losartan (Cozaar) 100 mg once daily   Labwork: TODAY - cholesterol  Your physician recommends that you return for lab work on July 18 for basic metabolic panel    Testing/Procedures: None Ordered   Follow-Up: Your physician recommends that you return for a follow-up appointment on Thursday July 18 at 2:00 pm for Nurse visit for BP check and lab work   Your physician wants you to follow-up in: 6 months with Dr. Acie Fredrickson. You will receive a reminder letter in the mail two months in advance. If you don't receive a letter, please call our office to schedule the follow-up appointment.   If you need a refill on your cardiac medications before your next appointment, please call your pharmacy.   Thank you for choosing [facility]! Christen Bame, RN [phone]

## 2018-01-11 NOTE — Progress Notes (Signed)
Carla Foster Date of Birth  29-May-1941 [place] [address]    [place]   [place], [place]  [address]    [place], [place]  [address] [phone]  Fax  [phone]  [phone]  Fax [phone]  Problems: 1. Hypertension 2. Hyperlipidemia 3. Diabetes mellitus  History of Present Illness:  [name] is a 77 year old female with a history of hypertension and hyperlipidemia. She also has a history of type 2 diabetes mellitus. She's been having problems with elevated blood pressure recently. I saw her in July for hypertension.  She feels fine. She's not had any episodes of chest pain or shortness breath. She continues to lose weight.  She has been told  to watch her salt intake. She used to use a lot of fried bologna.  January 17, 2013:  She is doing well.  No CP , no dyspnea.   Still eating salt.   March 09, 2014:  Ms. Vath is doing well.  Walking regularly.  Hard of hearing - bought an expensive heaing aid that does not work well.   No CP or dyspnea.    Aug. 29, 2016:  Seen back today for HTN BP is a bit up today . Had a stomach virus yesterday - better today  Has not been eating any extra salt . No CP or dyspnea  Aug. 29, 2017:  Doing well.   Had some cataract surgery recently .  Stays active.   Mows with a push mower.  Tries to watch her salt   Dec.  6, 2018:     Seen for follow up of her HTN Has had lots of dental work  Does not avoid salty foods  - loves country ham - eats once a week,  Eats salty foods regularly , does not cook for herself  January 11, 2018:   Ms. Laseter is seen today for follow-up of her hypertension and hyperlipidemia.  Still eats some sausage and ham .    Taking and tolerating all her meds.  Very active.   Was in the hospital with bleeding ulcer .    Received 2 units PRBC. .  Was taking ASA and advil for back pain .  Has a cough - is on Lisinopril  Will change to Losartan     Current Outpatient Medications on File Prior to Visit  Medication Sig Dispense Refill  . albuterol (PROVENTIL HFA;VENTOLIN HFA) 108 (90 Base) MCG/ACT inhaler Inhale 2 puffs into the lungs as directed.    Marland Kitchen amLODipine (NORVASC) 5 MG tablet Take 1 tablet (5 mg total) by mouth daily. 90 tablet 0  . benzonatate (TESSALON) 100 MG capsule Take 1 capsule (100 mg total) by mouth 2 (two) times daily as needed for cough. 20 capsule 0  . calcium carbonate (TUMS - DOSED IN MG ELEMENTAL CALCIUM) 500 MG chewable tablet Chew 1-2 tablets by mouth as needed for indigestion or heartburn.    . Calcium Carbonate-Vitamin D (CALCIUM 600 + D PO) Take 1 tablet by mouth daily.      . carvedilol (COREG) 25 MG tablet Take 1 tablet (25 mg total) by mouth 2 (two) times daily. 180 tablet 3  . cholecalciferol (VITAMIN D) 1000 UNITS tablet Take 1,000 Units by mouth daily.      Marland Kitchen doxazosin (CARDURA) 2 MG tablet Take 1 tablet (2 mg total) by mouth at bedtime. 90 tablet 3  . fenofibrate 160 MG  tablet TAKE 1 TABLET BY MOUTH EVERY DAY 90 tablet 1  . fish oil-omega-3 fatty acids 1000 MG capsule Take 1 g by mouth daily.      . Flaxseed, Linseed, (FLAX SEED OIL PO) Take 1 capsule by mouth daily.      . hydrochlorothiazide (HYDRODIURIL) 25 MG tablet Take 25 mg by mouth daily.     Marland Kitchen ibuprofen (ADVIL,MOTRIN) 600 MG tablet Take 600 mg by mouth every 6 (six) hours as needed.  0  . lisinopril (PRINIVIL,ZESTRIL) 40 MG tablet TAKE 1 TABLET BY MOUTH EVERY DAY 90 tablet 3  . metFORMIN (GLUCOPHAGE) 1000 MG tablet Take 1,000 mg by mouth daily.     . pantoprazole (PROTONIX) 40 MG tablet Take 1 tablet (40 mg total) by mouth daily before breakfast. 30 tablet 0  . Propylene Glycol (SYSTANE BALANCE) 0.6 % SOLN Apply 1 drop to eye as needed (dry eyes).    . simvastatin (ZOCOR) 20 MG tablet Take 1 tablet (20 mg total) by mouth at bedtime. 90 tablet 3  . vitamin A 8000 UNIT capsule Take 1 capsule by mouth daily.    . vitamin C (ASCORBIC ACID) 500 MG  tablet Take 500 mg by mouth daily.       No current facility-administered medications on file prior to visit.     Allergies  Allergen Reactions  . Tiazac [Diltiazem Hcl]     Ankle edema    Past Medical History:  Diagnosis Date  . Acute blood loss anemia   . AKI (acute kidney injury) ([place])   . Asthma   . Complication of anesthesia    nausea  . Diabetes mellitus    type 2  . GI bleed   . Hematemesis   . Hyperlipidemia   . Hypertension   . Melena     Past Surgical History:  Procedure Laterality Date  . CERVICAL DISCECTOMY  08/23/00   C5-6, C6-7 anterior cervical diskectomy with fibular bone bank fusion followed by Atlantis anterior cervical plating with the operating microscope  --  SURGEON:  Faythe Ghee, M.D.  . ESOPHAGOGASTRODUODENOSCOPY (EGD) WITH PROPOFOL N/A 11/21/2017   Procedure: ESOPHAGOGASTRODUODENOSCOPY (EGD) WITH PROPOFOL;  Surgeon: Gatha Mayer, MD;  Location: [facility];  Service: Endoscopy;  Laterality: N/A;  . PARTIAL HYSTERECTOMY      Social History   Tobacco Use  Smoking Status Former Smoker  . Packs/day: 1.00  . Years: 18.00  . Pack years: 18.00  . Types: Cigarettes  . Last attempt to quit: 01/13/1974  . Years since quitting: 44.0  Smokeless Tobacco Never Used    Social History   Substance and Sexual Activity  Alcohol Use No    Family History  Problem Relation Age of Onset  . Emphysema Father   . Stroke Mother   . Hypertension Mother   . Atrial fibrillation Mother        heart flutter    Reviw of Systems:  Reviewed in the HPI.  All other systems are negative.  Physical Exam: Blood pressure (!) 152/68, pulse 72, height 5\' 6"  (1.676 m), weight 146 lb (66.2 kg), SpO2 99 %.  GEN:  Well nourished, well developed in no acute distress HEENT: Normal NECK: No JVD; No carotid bruits LYMPHATICS: No lymphadenopathy CARDIAC: RRR , no murmurs, rubs, gallops RESPIRATORY:  Clear to auscultation without rales, wheezing or rhonchi   ABDOMEN: Soft, non-tender, non-distended MUSCULOSKELETAL:  No edema; No deformity  SKIN: Warm and dry NEUROLOGIC:  Alert and oriented x 3  ECG:    Assessment / Plan:    1. Hypertension-    blood pressure remains mildly elevated.  She still eats some additional salt.  She is on lisinopril and has developed a cough.  We will change the lisinopril to losartan 100 mg a day.  We will have her come back for nurse visit in approximately 1 month.  We will get a basic metabolic profile at that time.  We will consider increasing Cardura if her blood pressure remains elevated.    2. Hyperlipidemia-   he is on atorvastatin 80 mg a day.  Check fasting lipids today.  Her basic metabolic profile from last month shows normal renal function and normal potassium.  Her liver enzymes were normal.  3. Diabetes mellitus-other management per her primary medical doctor.    Mertie Moores, MD  01/11/2018 9:41 AM    [place] [place] [address],  [place], [place]  [address] Pager [phone] Phone: [phone]; Fax: [phone]

## 2018-01-18 ENCOUNTER — Telehealth: Payer: Self-pay | Admitting: Cardiovascular Disease

## 2018-01-18 NOTE — Telephone Encounter (Signed)
Follow Up:     Returning a call from yesterday,concerning her lab results.

## 2018-01-18 NOTE — Telephone Encounter (Signed)
Spoke to patient and informed her of lab results.  She verbalized understanding.

## 2018-02-03 ENCOUNTER — Other Ambulatory Visit: Payer: Medicare Other | Admitting: *Deleted

## 2018-02-03 ENCOUNTER — Ambulatory Visit (INDEPENDENT_AMBULATORY_CARE_PROVIDER_SITE_OTHER): Payer: Medicare Other

## 2018-02-03 VITALS — BP 148/60 | HR 77 | Ht 66.0 in | Wt 147.0 lb

## 2018-02-03 DIAGNOSIS — I10 Essential (primary) hypertension: Secondary | ICD-10-CM

## 2018-02-03 DIAGNOSIS — E782 Mixed hyperlipidemia: Secondary | ICD-10-CM

## 2018-02-03 MED ORDER — DOXAZOSIN MESYLATE 4 MG PO TABS: 4.0000 mg | ORAL_TABLET | Freq: Every day | ORAL | 11 refills | Status: DC

## 2018-02-03 NOTE — Patient Instructions (Addendum)
Medication Instructions:  Your physician has recommended you make the following change in your medication:  1-INCREASE Cardura to 4 mg by mouth daily.  Labwork: NONE  Testing/Procedures: NONE  Follow-Up: Your physician wants you to follow-up as directed.  Check your BP daily for two weeks and call our office back with results. Our goal is to have your BP 120-130/ 60-80.   If you need a refill on your cardiac medications before your next appointment, please call your pharmacy.

## 2018-02-03 NOTE — Progress Notes (Signed)
1.) Reason for visit:  BP check  2.) Name of MD requesting visit: Dr. Acie Fredrickson  3.) H&P: Patient has history of HTN, Hyperlipidemia, and DM. At patient's last office visit with Dr. Acie Fredrickson on 01/11/18, patient was started on losartan 100 mg and her lisinopril was discontinued. Patient's BP was 152/68.  4.) ROS related to problem: Today patient's BP 148/60, HR 77, and O2 95% on room air. Patient's weight 147 lbs and height 5\' 6" . Patient state her BP was 145/50 at home. Patient's BP was elevated when she first came in (BP 162/52), because she gets worked up when she is at the [employer] office. Patient stated also today she did not eat lunch and her blood sugar dropped. Encouraged patient to not skip meals.  5.) Assessment and plan per MD: Consulted Dr. Acie Fredrickson with patient's BP readings. He recommends patient to increase Cardura to 4 mg by mouth daily. Encouraged patient to keep a record of her BP for a couple of weeks and to call our office with results. Informed patient her BP goal is 120-130/ 60-80.

## 2018-02-04 LAB — BASIC METABOLIC PANEL
BUN / CREAT RATIO: 21 (ref 12–28)
BUN: 23 mg/dL (ref 8–27)
CO2: 23 mmol/L (ref 20–29)
CREATININE: 1.07 mg/dL — AB (ref 0.57–1.00)
Calcium: 9.3 mg/dL (ref 8.7–10.3)
Chloride: 103 mmol/L (ref 96–106)
GFR calc Af Amer: 57 mL/min/{1.73_m2} — ABNORMAL LOW (ref >59)
GFR, EST NON AFRICAN AMERICAN: 50 mL/min/{1.73_m2} — AB (ref >59)
GLUCOSE: 120 mg/dL — AB (ref 65–99)
Potassium: 4.6 mmol/L (ref 3.5–5.2)
SODIUM: 137 mmol/L (ref 134–144)

## 2018-03-25 LAB — HM COLONOSCOPY

## 2018-06-15 ENCOUNTER — Other Ambulatory Visit: Payer: Self-pay | Admitting: Cardiovascular Disease

## 2018-06-15 NOTE — Telephone Encounter (Signed)
Outpatient Medication Detail    Disp Refills Start End   doxazosin (CARDURA) 4 MG tablet 30 tablet 11 02/03/2018    Sig - Route: Take 1 tablet (4 mg total) by mouth at bedtime. - Oral   Sent to pharmacy as: doxazosin (CARDURA) 4 MG tablet   E-Prescribing Status: Receipt confirmed by pharmacy (02/03/2018 2:51 PM EDT)   Pharmacy   [id number] - [place], [place] - [address]

## 2018-07-10 ENCOUNTER — Other Ambulatory Visit: Payer: Self-pay | Admitting: Cardiovascular Disease

## 2018-07-25 ENCOUNTER — Emergency Department (HOSPITAL_COMMUNITY): Payer: Medicare Other

## 2018-07-25 ENCOUNTER — Inpatient Hospital Stay (HOSPITAL_COMMUNITY)
Admission: EM | Admit: 2018-07-25 | Discharge: 2018-08-02 | DRG: 291 | Disposition: A | Discharge status: Home Health | Payer: Medicare Other | Attending: Internal Medicine | Admitting: Internal Medicine

## 2018-07-25 ENCOUNTER — Encounter (HOSPITAL_COMMUNITY): Payer: Self-pay

## 2018-07-25 ENCOUNTER — Other Ambulatory Visit: Payer: Self-pay

## 2018-07-25 DIAGNOSIS — I272 Pulmonary hypertension, unspecified: Secondary | ICD-10-CM | POA: Diagnosis present

## 2018-07-25 DIAGNOSIS — J4541 Moderate persistent asthma with (acute) exacerbation: Secondary | ICD-10-CM | POA: Diagnosis not present

## 2018-07-25 DIAGNOSIS — D509 Iron deficiency anemia, unspecified: Secondary | ICD-10-CM | POA: Diagnosis present

## 2018-07-25 DIAGNOSIS — N183 Chronic kidney disease, stage 3 (moderate): Secondary | ICD-10-CM | POA: Diagnosis present

## 2018-07-25 DIAGNOSIS — J45901 Unspecified asthma with (acute) exacerbation: Secondary | ICD-10-CM | POA: Diagnosis present

## 2018-07-25 DIAGNOSIS — I081 Rheumatic disorders of both mitral and tricuspid valves: Secondary | ICD-10-CM | POA: Diagnosis present

## 2018-07-25 DIAGNOSIS — E871 Hypo-osmolality and hyponatremia: Secondary | ICD-10-CM | POA: Diagnosis present

## 2018-07-25 DIAGNOSIS — Z8249 Family history of ischemic heart disease and other diseases of the circulatory system: Secondary | ICD-10-CM

## 2018-07-25 DIAGNOSIS — J9601 Acute respiratory failure with hypoxia: Secondary | ICD-10-CM | POA: Diagnosis present

## 2018-07-25 DIAGNOSIS — D72820 Lymphocytosis (symptomatic): Secondary | ICD-10-CM | POA: Diagnosis present

## 2018-07-25 DIAGNOSIS — Z888 Allergy status to other drugs, medicaments and biological substances status: Secondary | ICD-10-CM

## 2018-07-25 DIAGNOSIS — Z789 Other specified health status: Secondary | ICD-10-CM

## 2018-07-25 DIAGNOSIS — Z791 Long term (current) use of non-steroidal anti-inflammatories (NSAID): Secondary | ICD-10-CM

## 2018-07-25 DIAGNOSIS — I5033 Acute on chronic diastolic (congestive) heart failure: Secondary | ICD-10-CM | POA: Diagnosis present

## 2018-07-25 DIAGNOSIS — Z452 Encounter for adjustment and management of vascular access device: Secondary | ICD-10-CM

## 2018-07-25 DIAGNOSIS — D631 Anemia in chronic kidney disease: Secondary | ICD-10-CM | POA: Diagnosis present

## 2018-07-25 DIAGNOSIS — H9190 Unspecified hearing loss, unspecified ear: Secondary | ICD-10-CM | POA: Diagnosis present

## 2018-07-25 DIAGNOSIS — R0602 Shortness of breath: Secondary | ICD-10-CM | POA: Diagnosis not present

## 2018-07-25 DIAGNOSIS — E1122 Type 2 diabetes mellitus with diabetic chronic kidney disease: Secondary | ICD-10-CM | POA: Diagnosis present

## 2018-07-25 DIAGNOSIS — I130 Hypertensive heart and chronic kidney disease with heart failure and stage 1 through stage 4 chronic kidney disease, or unspecified chronic kidney disease: Principal | ICD-10-CM | POA: Diagnosis present

## 2018-07-25 DIAGNOSIS — Z981 Arthrodesis status: Secondary | ICD-10-CM

## 2018-07-25 DIAGNOSIS — J189 Pneumonia, unspecified organism: Secondary | ICD-10-CM | POA: Diagnosis present

## 2018-07-25 DIAGNOSIS — I358 Other nonrheumatic aortic valve disorders: Secondary | ICD-10-CM

## 2018-07-25 DIAGNOSIS — Z79899 Other long term (current) drug therapy: Secondary | ICD-10-CM

## 2018-07-25 DIAGNOSIS — I10 Essential (primary) hypertension: Secondary | ICD-10-CM | POA: Diagnosis present

## 2018-07-25 DIAGNOSIS — Z8711 Personal history of peptic ulcer disease: Secondary | ICD-10-CM

## 2018-07-25 DIAGNOSIS — J181 Lobar pneumonia, unspecified organism: Secondary | ICD-10-CM | POA: Diagnosis not present

## 2018-07-25 DIAGNOSIS — E11628 Type 2 diabetes mellitus with other skin complications: Secondary | ICD-10-CM

## 2018-07-25 DIAGNOSIS — Z825 Family history of asthma and other chronic lower respiratory diseases: Secondary | ICD-10-CM

## 2018-07-25 DIAGNOSIS — K921 Melena: Secondary | ICD-10-CM | POA: Diagnosis present

## 2018-07-25 DIAGNOSIS — E861 Hypovolemia: Secondary | ICD-10-CM | POA: Diagnosis present

## 2018-07-25 DIAGNOSIS — R062 Wheezing: Secondary | ICD-10-CM

## 2018-07-25 DIAGNOSIS — K920 Hematemesis: Secondary | ICD-10-CM | POA: Diagnosis present

## 2018-07-25 DIAGNOSIS — R110 Nausea: Secondary | ICD-10-CM

## 2018-07-25 DIAGNOSIS — Z823 Family history of stroke: Secondary | ICD-10-CM

## 2018-07-25 DIAGNOSIS — Z87891 Personal history of nicotine dependence: Secondary | ICD-10-CM

## 2018-07-25 DIAGNOSIS — E119 Type 2 diabetes mellitus without complications: Secondary | ICD-10-CM

## 2018-07-25 DIAGNOSIS — Z90710 Acquired absence of both cervix and uterus: Secondary | ICD-10-CM

## 2018-07-25 DIAGNOSIS — E785 Hyperlipidemia, unspecified: Secondary | ICD-10-CM | POA: Diagnosis present

## 2018-07-25 LAB — BASIC METABOLIC PANEL
ANION GAP: 12 (ref 5–15)
BUN: 21 mg/dL (ref 8–23)
CALCIUM: 8.3 mg/dL — AB (ref 8.9–10.3)
CO2: 20 mmol/L — ABNORMAL LOW (ref 22–32)
Chloride: 78 mmol/L — ABNORMAL LOW (ref 98–111)
Creatinine, Ser: 1.11 mg/dL — ABNORMAL HIGH (ref 0.44–1.00)
GFR, EST AFRICAN AMERICAN: 55 mL/min — AB (ref >60)
GFR, EST NON AFRICAN AMERICAN: 47 mL/min — AB (ref >60)
Glucose, Bld: 323 mg/dL — ABNORMAL HIGH (ref 70–99)
Potassium: 5.1 mmol/L (ref 3.5–5.1)
SODIUM: 110 mmol/L — AB (ref 135–145)

## 2018-07-25 LAB — HEPATIC FUNCTION PANEL
ALT: 25 U/L (ref 0–44)
AST: 39 U/L (ref 15–41)
Albumin: 2.8 g/dL — ABNORMAL LOW (ref 3.5–5.0)
Alkaline Phosphatase: 34 U/L — ABNORMAL LOW (ref 38–126)
BILIRUBIN TOTAL: 1.3 mg/dL — AB (ref 0.3–1.2)
Bilirubin, Direct: 0.4 mg/dL — ABNORMAL HIGH (ref 0.0–0.2)
Indirect Bilirubin: 0.9 mg/dL (ref 0.3–0.9)
Total Protein: 6.1 g/dL — ABNORMAL LOW (ref 6.5–8.1)

## 2018-07-25 LAB — CBC WITH DIFFERENTIAL/PLATELET
Abs Immature Granulocytes: 0.24 10*3/uL — ABNORMAL HIGH (ref 0.00–0.07)
BASOS ABS: 0 10*3/uL (ref 0.0–0.1)
BASOS PCT: 0 %
EOS ABS: 0 10*3/uL (ref 0.0–0.5)
Eosinophils Relative: 0 %
HCT: 21.9 % — ABNORMAL LOW (ref 36.0–46.0)
Hemoglobin: 7.4 g/dL — ABNORMAL LOW (ref 12.0–15.0)
IMMATURE GRANULOCYTES: 2 %
Lymphocytes Relative: 3 %
Lymphs Abs: 0.4 10*3/uL — ABNORMAL LOW (ref 0.7–4.0)
MCH: 26.4 pg (ref 26.0–34.0)
MCHC: 33.8 g/dL (ref 30.0–36.0)
MCV: 78.2 fL — AB (ref 80.0–100.0)
MONOS PCT: 6 %
Monocytes Absolute: 0.9 10*3/uL (ref 0.1–1.0)
NRBC: 0.1 % (ref 0.0–0.2)
Neutro Abs: 12.6 10*3/uL — ABNORMAL HIGH (ref 1.7–7.7)
Neutrophils Relative %: 89 %
PLATELETS: 297 10*3/uL (ref 150–400)
RBC: 2.8 MIL/uL — AB (ref 3.87–5.11)
RDW: 12 % (ref 11.5–15.5)
WBC: 14.1 10*3/uL — AB (ref 4.0–10.5)

## 2018-07-25 LAB — POC OCCULT BLOOD, ED: Fecal Occult Bld: NEGATIVE

## 2018-07-25 LAB — INFLUENZA PANEL BY PCR (TYPE A & B)
INFLAPCR: NEGATIVE
Influenza B By PCR: NEGATIVE

## 2018-07-25 LAB — I-STAT TROPONIN, ED: Troponin i, poc: 0.01 ng/mL (ref 0.00–0.08)

## 2018-07-25 LAB — BRAIN NATRIURETIC PEPTIDE: B Natriuretic Peptide: 682.3 pg/mL — ABNORMAL HIGH (ref 0.0–100.0)

## 2018-07-25 MED ORDER — DOXYCYCLINE HYCLATE 100 MG PO TABS
100.0000 mg | ORAL_TABLET | Freq: Once | ORAL | Status: AC
  Administered 2018-07-25: 100 mg via ORAL
  Filled 2018-07-25: qty 1

## 2018-07-25 MED ORDER — SODIUM CHLORIDE 0.9 % IV SOLN
1.0000 g | Freq: Once | INTRAVENOUS | Status: AC
  Administered 2018-07-25: 1 g via INTRAVENOUS
  Filled 2018-07-25: qty 10

## 2018-07-25 MED ORDER — IPRATROPIUM BROMIDE 0.02 % IN SOLN
0.5000 mg | Freq: Once | RESPIRATORY_TRACT | Status: AC
  Administered 2018-07-25: 0.5 mg via RESPIRATORY_TRACT
  Filled 2018-07-25: qty 2.5

## 2018-07-25 MED ORDER — ALBUTEROL (5 MG/ML) CONTINUOUS INHALATION SOLN
10.0000 mg/h | INHALATION_SOLUTION | RESPIRATORY_TRACT | Status: DC
  Administered 2018-07-25: 10 mg/h via RESPIRATORY_TRACT
  Filled 2018-07-25: qty 20

## 2018-07-25 MED ORDER — ONDANSETRON HCL 4 MG/2ML IJ SOLN
4.0000 mg | Freq: Once | INTRAMUSCULAR | Status: AC
  Administered 2018-07-25: 4 mg via INTRAVENOUS
  Filled 2018-07-25: qty 2

## 2018-07-25 NOTE — ED Triage Notes (Signed)
Pt from home with hx of Asthma complaining of progressively increasing shortness of breath.  Also feeling as if she needs to vomit but cannot.  A&Ox4 very hard of hearing.    Meds by EMS: Duo-Neb x2, 10mg  albuterol, 125 solumedrol IV, and 2g Mag.  Initial O2 saturations were in the 70s upon EMS arrival.

## 2018-07-25 NOTE — ED Notes (Signed)
Date and time results received: 07/25/18 9:50 PM   Test: Sodium Critical Value: 110  Name of Provider Notified: Creig Hines MD  Orders Received? Or Actions Taken?: no new orders

## 2018-07-25 NOTE — H&P (Addendum)
NAME:  Carla Foster, MRN:  [id number], DOB:  07-29-1940, LOS: 0 ADMISSION DATE:  07/25/2018, CONSULTATION DATE:  1/6 REFERRING MD:  Dr. Ralene Bathe EDP, CHIEF COMPLAINT:  Hyponatremia   Brief History   78 year old female presented with SOB found to have bibasilar infiltrates and sodium 110.   History of present illness   78 year old female with PMH as below, which is significant for HTN, DM, GIB, HLD, and is very hard of hearing. She was in her usual state of health until around 12/29 when she developed SOB and cough. Cough was productive of green sputum. Denies significant fever or chills. She presented to PCP for these complaints plus diarrhea on 12/31 and was felt to have URI. She was given a steroid injection and recommended to stay hydrated. Since that visit her condition continued to worsen. Cough became less productive, but otherwise SOB, diarrhea worsened and she developed vomiting on 1/5. Family reports that she typically drinks upward 8 glasses of water per day (for the past 11 years), but since falling ill she has not been eating or drinking much at all. She presented to ED 1/6 with these complaints. CXR was done and found bibasilar infiltrates. Laboratory evaluation was significant for sodium 110 (was 137 back in July), chloride 78, glucose 323, serum creatinine 1.11, BMP 682, WBC 14.1, and hemoglobin 7.4. She was started on empiric antibiotics for presumed CAP and PCCM was called for admission.   Past Medical History   has a past medical history of Acute blood loss anemia, AKI (acute kidney injury) ([place]), Asthma, Complication of anesthesia, Diabetes mellitus, GI bleed, Hematemesis, Hyperlipidemia, Hypertension, and Melena.  Significant Hospital Events   1/6 admit  Consults:    Procedures:    Significant Diagnostic Tests:    Micro Data:  Blood 1/7 > Sputum 1/7 > Urine 1/7 >  Antimicrobials:  Ceftriaxone 1/6 > Doxycycline 1/6  Azithromycin 1/7 >  Interim history/subjective:     Objective   Blood pressure (!) 139/54, pulse 68, temperature (!) 97.4 F (36.3 C), temperature source Oral, resp. rate 13, SpO2 100 %.        Intake/Output Summary (Last 24 hours) at 07/25/2018 2349 Last data filed at 07/25/2018 2202 Gross per 24 hour  Intake 100 ml  Output -  Net 100 ml   There were no vitals filed for this visit.  Examination: General: Elderly female in NAD HENT: [place]/AT, PERRL, no appreciable JVD Lungs: Diminished bases, expiratory wheeze.  Cardiovascular: RRR, no MRG Abdomen: Soft, non-tender, somewhat distended Extremities: No acute deformity. +1 pitting edema to bilateral lower extremities.  Neuro: Alert, oriented, non-focal. Very hard of hearing  Resolved Hospital Problem list     Assessment & Plan:   Severe hyponatremia: Sodium 110 on admission (corrects to 114 - Glucose). Cause not entirely clear, as it is difficult to gauge her volume status. She has been feeling ill recently and has not been eating or drinking much. In addition she has had diarrhea, which raises concern for a hypovolemic state. She is also on HCTZ.  However, her BNP is elevated and she has mild bilateral lower extremity pitting edema. CXR could possibly represent pulmonary edema rather than PNA, but seems less likely based on history. She does not appear overtly dry on exam.  - Admit to ICU for telemetry monitoring and frequent lab draws.  - History more in line with hypovolemia, so will start normal saline at a low rate  - Na every 2  hours - Check urine sodium, creatinine, chloride, osm  Community acquired pneumonia - Continue ceftriaxone - Start azithromycin  - Pan culture - Trend WBC - Check procalcitonin - Check RVP, urine strep, urine legionella.   Asthma with acute exacerbation: SOB with expiratory wheeze on exam. The possibility exists this could represent cardiac wheeze, however, her history is more in line with pneumonia.  - Nebulized steroids, may need systemic. -  Scheduled and PRN bronchodilators  Hypertension - continue home amlodipine, carvedilol, doxazosin, losartan - Holding HCTZ due to sodium - Echocardiogram  DM type 2: exacerbated by recent steroids.  - CBG monitoring and SSI   Best practice:  Diet: NPO Pain/Anxiety/Delirium protocol (if indicated): Na VAP protocol (if indicated): Na DVT prophylaxis: heparin GI prophylaxis: na Glucose control: SSI Mobility: BR Code Status: Full Family Communication: Patient and daughters updated in ED Disposition: ICU  Labs   CBC: Recent Labs  Lab 07/25/18 2056  WBC 14.1*  NEUTROABS 12.6*  HGB 7.4*  HCT 21.9*  MCV 78.2*  PLT 409    Basic Metabolic Panel: Recent Labs  Lab 07/25/18 2056  NA 110*  K 5.1  CL 78*  CO2 20*  GLUCOSE 323*  BUN 21  CREATININE 1.11*  CALCIUM 8.3*   GFR: CrCl cannot be calculated (Unknown ideal weight.). Recent Labs  Lab 07/25/18 2056  WBC 14.1*    Liver Function Tests: Recent Labs  Lab 07/25/18 2056  AST 39  ALT 25  ALKPHOS 34*  BILITOT 1.3*  PROT 6.1*  ALBUMIN 2.8*   No results for input(s): LIPASE, AMYLASE in the last 168 hours. No results for input(s): AMMONIA in the last 168 hours.  ABG No results found for: PHART, PCO2ART, PO2ART, HCO3, TCO2, ACIDBASEDEF, O2SAT   Coagulation Profile: No results for input(s): INR, PROTIME in the last 168 hours.  Cardiac Enzymes: No results for input(s): CKTOTAL, CKMB, CKMBINDEX, TROPONINI in the last 168 hours.  HbA1C: Hgb A1c MFr Bld  Date/Time Value Ref Range Status  11/20/2017 11:45 AM 6.8 (H) 4.8 - 5.6 % Final    Comment:    (NOTE) Pre diabetes:          5.7%-6.4% Diabetes:              >6.4% Glycemic control for   <7.0% adults with diabetes     CBG: No results for input(s): GLUCAP in the last 168 hours.  Review of Systems:   Bolds are positive  Constitutional: weight loss, gain, night sweats, Fevers, chills only once last week, fatigue .  HEENT: headaches, Sore throat,  sneezing, nasal congestion, post nasal drip, Difficulty swallowing, Tooth/dental problems, visual complaints visual changes, ear ache CV:  chest pain, radiates:,Orthopnea, PND, swelling in lower extremities, dizziness, palpitations, syncope.  GI  heartburn, indigestion, abdominal pain, "puffy" abdomen nausea, vomiting, diarrhea, change in bowel habits, loss of appetite, bloody stools.  Resp: cough, productive green sputum:, hemoptysis, dyspnea, chest pain, pleuritic.  Skin: rash or itching or icterus GU: dysuria, change in color of urine, urgency or frequency. flank pain, hematuria  MS: joint pain or swelling. decreased range of motion  Psych: change in mood or affect. depression or anxiety.  Neuro: difficulty with speech, weakness, numbness, ataxia    Past Medical History  She,  has a past medical history of Acute blood loss anemia, AKI (acute kidney injury) ([place]), Asthma, Complication of anesthesia, Diabetes mellitus, GI bleed, Hematemesis, Hyperlipidemia, Hypertension, and Melena.   Surgical History    Past Surgical History:  Procedure  Laterality Date  . CERVICAL DISCECTOMY  08/23/00   C5-6, C6-7 anterior cervical diskectomy with fibular bone bank fusion followed by Atlantis anterior cervical plating with the operating microscope  --  SURGEON:  Faythe Ghee, M.D.  . ESOPHAGOGASTRODUODENOSCOPY (EGD) WITH PROPOFOL N/A 11/21/2017   Procedure: ESOPHAGOGASTRODUODENOSCOPY (EGD) WITH PROPOFOL;  Surgeon: Gatha Mayer, MD;  Location: [facility];  Service: Endoscopy;  Laterality: N/A;  . PARTIAL HYSTERECTOMY       Social History   reports that she quit smoking about 44 years ago. Her smoking use included cigarettes. She has a 18.00 pack-year smoking history. She has never used smokeless tobacco. She reports that she does not drink alcohol or use drugs.   Family History   Her family history includes Atrial fibrillation in her mother; Emphysema in her father; Hypertension in her mother;  Stroke in her mother.   Allergies Allergies  Allergen Reactions  . Tiazac [Diltiazem Hcl]     Ankle edema     Home Medications  Prior to Admission medications   Medication Sig Start Date End Date Taking? Authorizing Provider  albuterol (PROVENTIL HFA;VENTOLIN HFA) 108 (90 Base) MCG/ACT inhaler Inhale 2 puffs into the lungs as directed. 01/28/17   [provider]  amLODipine (NORVASC) 5 MG tablet Take 1 tablet (5 mg total) by mouth daily. 09/29/12   Nahser, Wonda Cheng, MD  benzonatate (TESSALON) 100 MG capsule Take 1 capsule (100 mg total) by mouth 2 (two) times daily as needed for cough. 11/23/17   Desiree Hane, MD  calcium carbonate (TUMS - DOSED IN MG ELEMENTAL CALCIUM) 500 MG chewable tablet Chew 1-2 tablets by mouth as needed for indigestion or heartburn.    [provider]  Calcium Carbonate-Vitamin D (CALCIUM 600 + D PO) Take 1 tablet by mouth daily.      [provider]  carvedilol (COREG) 25 MG tablet TAKE 1 TABLET BY MOUTH TWICE A DAY 07/11/18   Nahser, Wonda Cheng, MD  cholecalciferol (VITAMIN D) 1000 UNITS tablet Take 1,000 Units by mouth daily.      [provider]  doxazosin (CARDURA) 4 MG tablet Take 1 tablet (4 mg total) by mouth at bedtime. 02/03/18   Nahser, Wonda Cheng, MD  fenofibrate 160 MG tablet TAKE 1 TABLET BY MOUTH EVERY DAY 12/10/14   Nahser, Wonda Cheng, MD  fish oil-omega-3 fatty acids 1000 MG capsule Take 1 g by mouth daily.      [provider]  Flaxseed, Linseed, (FLAX SEED OIL PO) Take 1 capsule by mouth daily.      [provider]  hydrochlorothiazide (HYDRODIURIL) 25 MG tablet Take 25 mg by mouth daily.  01/10/16   [provider]  ibuprofen (ADVIL,MOTRIN) 600 MG tablet Take 600 mg by mouth every 6 (six) hours as needed. 05/14/17   [provider]  losartan (COZAAR) 100 MG tablet Take 1 tablet (100 mg total) by mouth daily. 01/11/18   Nahser, Wonda Cheng, MD  metFORMIN (GLUCOPHAGE) 1000 MG tablet Take  1,000 mg by mouth daily.     [provider]  pantoprazole (PROTONIX) 40 MG tablet Take 1 tablet (40 mg total) by mouth daily before breakfast. 11/24/17   Oretha Milch D, MD  Propylene Glycol (SYSTANE BALANCE) 0.6 % SOLN Apply 1 drop to eye as needed (dry eyes).    [provider]  simvastatin (ZOCOR) 20 MG tablet Take 1 tablet (20 mg total) by mouth at bedtime. 12/28/11   Nahser, Wonda Cheng, MD  vitamin A 8000 UNIT capsule Take 1 capsule by mouth daily. 08/27/15   [provider]  vitamin C (ASCORBIC ACID) 500 MG tablet Take 500 mg by mouth daily.      [provider]     Critical care time: 20 mins    Georgann Housekeeper, AGACNP-BC [place] Pager [phone] or [phone]  07/26/2018 12:33 AM

## 2018-07-25 NOTE — ED Provider Notes (Signed)
[facility] EMERGENCY DEPARTMENT Provider Note   CSN: [id number] Arrival date & time: 07/25/18  2041     History   Chief Complaint Chief Complaint  Patient presents with  . Shortness of Breath  . Nausea    HPI Carla Foster is a 78 y.o. female.  HPI  Patient is a 78 year old female with past medical history of anemia, asthma, DM, HTN, and HDL who presents for evaluation via EMS from home for evaluation of approximately 1 week of increasing shortness of breath associated with cough as well as 1 day of nausea, vomiting, and diarrhea.  Patient notes she has chronic dark stools and denies any chest pain or rash.  Further history is limited secondary to patient's respiratory distress and unable to complete full sentences.  Past Medical History:  Diagnosis Date  . Acute blood loss anemia   . AKI (acute kidney injury) ([place])   . Asthma   . Complication of anesthesia    nausea  . Diabetes mellitus    type 2  . GI bleed   . Hematemesis   . Hyperlipidemia   . Hypertension   . Melena     Patient Active Problem List   Diagnosis Date Noted  . Acute pyloric channel ulcer   . Hematemesis 11/20/2017  . Melena 11/20/2017  . Hyponatremia 11/20/2017  . Acute blood loss anemia   . GI bleed   . AKI (acute kidney injury) ([place])   . HTN (hypertension) 01/23/2011  . Hyperlipidemia 01/23/2011    Past Surgical History:  Procedure Laterality Date  . CERVICAL DISCECTOMY  08/23/00   C5-6, C6-7 anterior cervical diskectomy with fibular bone bank fusion followed by Atlantis anterior cervical plating with the operating microscope  --  SURGEON:  Faythe Ghee, M.D.  . ESOPHAGOGASTRODUODENOSCOPY (EGD) WITH PROPOFOL N/A 11/21/2017   Procedure: ESOPHAGOGASTRODUODENOSCOPY (EGD) WITH PROPOFOL;  Surgeon: Gatha Mayer, MD;  Location: [place];  Service: Endoscopy;  Laterality: N/A;  . PARTIAL HYSTERECTOMY       OB History   No obstetric history on file.      Home  Medications    Prior to Admission medications   Medication Sig Start Date End Date Taking? Authorizing Provider  albuterol (PROVENTIL HFA;VENTOLIN HFA) 108 (90 Base) MCG/ACT inhaler Inhale 2 puffs into the lungs as directed. 01/28/17   [provider]  amLODipine (NORVASC) 5 MG tablet Take 1 tablet (5 mg total) by mouth daily. 09/29/12   Nahser, Wonda Cheng, MD  benzonatate (TESSALON) 100 MG capsule Take 1 capsule (100 mg total) by mouth 2 (two) times daily as needed for cough. 11/23/17   Desiree Hane, MD  calcium carbonate (TUMS - DOSED IN MG ELEMENTAL CALCIUM) 500 MG chewable tablet Chew 1-2 tablets by mouth as needed for indigestion or heartburn.    [provider]  Calcium Carbonate-Vitamin D (CALCIUM 600 + D PO) Take 1 tablet by mouth daily.      [provider]  carvedilol (COREG) 25 MG tablet TAKE 1 TABLET BY MOUTH TWICE A DAY 07/11/18   Nahser, Wonda Cheng, MD  cholecalciferol (VITAMIN D) 1000 UNITS tablet Take 1,000 Units by mouth daily.      [provider]  doxazosin (CARDURA) 4 MG tablet Take 1 tablet (4 mg total) by mouth at bedtime. 02/03/18   Nahser, Wonda Cheng, MD  fenofibrate 160 MG tablet TAKE 1 TABLET BY MOUTH EVERY DAY 12/10/14   Nahser, Wonda Cheng, MD  fish oil-omega-3  fatty acids 1000 MG capsule Take 1 g by mouth daily.      [provider]  Flaxseed, Linseed, (FLAX SEED OIL PO) Take 1 capsule by mouth daily.      [provider]  hydrochlorothiazide (HYDRODIURIL) 25 MG tablet Take 25 mg by mouth daily.  01/10/16   [provider]  ibuprofen (ADVIL,MOTRIN) 600 MG tablet Take 600 mg by mouth every 6 (six) hours as needed. 05/14/17   [provider]  losartan (COZAAR) 100 MG tablet Take 1 tablet (100 mg total) by mouth daily. 01/11/18   Nahser, Wonda Cheng, MD  metFORMIN (GLUCOPHAGE) 1000 MG tablet Take 1,000 mg by mouth daily.     [provider]  pantoprazole (PROTONIX) 40 MG tablet Take 1 tablet (40 mg total) by  mouth daily before breakfast. 11/24/17   Oretha Milch D, MD  Propylene Glycol (SYSTANE BALANCE) 0.6 % SOLN Apply 1 drop to eye as needed (dry eyes).    [provider]  simvastatin (ZOCOR) 20 MG tablet Take 1 tablet (20 mg total) by mouth at bedtime. 12/28/11   Nahser, Wonda Cheng, MD  vitamin A 8000 UNIT capsule Take 1 capsule by mouth daily. 08/27/15   [provider]  vitamin C (ASCORBIC ACID) 500 MG tablet Take 500 mg by mouth daily.      [provider]    Family History Family History  Problem Relation Age of Onset  . Emphysema Father   . Stroke Mother   . Hypertension Mother   . Atrial fibrillation Mother        heart flutter    Social History Social History   Tobacco Use  . Smoking status: Former Smoker    Packs/day: 1.00    Years: 18.00    Pack years: 18.00    Types: Cigarettes    Last attempt to quit: 01/13/1974    Years since quitting: 44.5  . Smokeless tobacco: Never Used  Substance Use Topics  . Alcohol use: No  . Drug use: No     Allergies   Tiazac [diltiazem hcl]   Review of Systems Review of Systems  Unable to perform ROS: Severe respiratory distress  HENT: Positive for congestion.   Respiratory: Positive for cough and shortness of breath.   Gastrointestinal: Positive for diarrhea, nausea and vomiting.     Physical Exam Updated Vital Signs BP (!) 136/56   Pulse 72   Temp (!) 97.4 F (36.3 C) (Oral)   Resp 15   SpO2 99%   Physical Exam Vitals signs and nursing note reviewed. Exam conducted with a chaperone present.  Constitutional:      General: She is not in acute distress.    Appearance: She is well-developed.  HENT:     Head: Normocephalic and atraumatic.  Eyes:     Conjunctiva/sclera: Conjunctivae normal.  Neck:     Musculoskeletal: Neck supple.  Cardiovascular:     Rate and Rhythm: Normal rate and regular rhythm.     Heart sounds: No murmur.  Pulmonary:     Effort: Pulmonary effort is normal. Tachypnea  present. No respiratory distress.     Breath sounds: Examination of the right-upper field reveals wheezing. Examination of the left-upper field reveals wheezing. Examination of the right-middle field reveals wheezing. Examination of the left-middle field reveals wheezing. Examination of the right-lower field reveals wheezing and rhonchi. Examination of the left-lower field reveals wheezing. Wheezing and rhonchi present.  Abdominal:     Palpations: Abdomen is soft.  Tenderness: There is no abdominal tenderness.  Genitourinary:    Rectum: No anal fissure, external hemorrhoid or internal hemorrhoid. Normal anal tone.  Skin:    General: Skin is warm and dry.  Neurological:     Mental Status: She is alert.      ED Treatments / Results  Labs (all labs ordered are listed, but only abnormal results are displayed) Labs Reviewed  BASIC METABOLIC PANEL - Abnormal; Notable for the following components:      Result Value   Sodium 110 (*)    Chloride 78 (*)    CO2 20 (*)    Glucose, Bld 323 (*)    Creatinine, Ser 1.11 (*)    Calcium 8.3 (*)    GFR calc non Af Amer 47 (*)    GFR calc Af Amer 55 (*)    All other components within normal limits  CBC WITH DIFFERENTIAL/PLATELET - Abnormal; Notable for the following components:   WBC 14.1 (*)    RBC 2.80 (*)    Hemoglobin 7.4 (*)    HCT 21.9 (*)    MCV 78.2 (*)    Neutro Abs 12.6 (*)    Lymphs Abs 0.4 (*)    Abs Immature Granulocytes 0.24 (*)    All other components within normal limits  BRAIN NATRIURETIC PEPTIDE - Abnormal; Notable for the following components:   B Natriuretic Peptide 682.3 (*)    All other components within normal limits  HEPATIC FUNCTION PANEL - Abnormal; Notable for the following components:   Total Protein 6.1 (*)    Albumin 2.8 (*)    Alkaline Phosphatase 34 (*)    Total Bilirubin 1.3 (*)    Bilirubin, Direct 0.4 (*)    All other components within normal limits  I-STAT CHEM 8, ED - Abnormal; Notable for the  following components:   Sodium 108 (*)    Chloride 77 (*)    BUN 24 (*)    Creatinine, Ser 1.10 (*)    Glucose, Bld 297 (*)    Calcium, Ion 1.11 (*)    Hemoglobin 8.2 (*)    HCT 24.0 (*)    All other components within normal limits  INFLUENZA PANEL BY PCR (TYPE A & B)  BLOOD GAS, VENOUS  PROTIME-INR  I-STAT TROPONIN, ED  POC OCCULT BLOOD, ED  TYPE AND SCREEN    EKG EKG Interpretation  Date/Time:  Monday July 25 2018 21:13:46 EST Ventricular Rate:  63 PR Interval:    QRS Duration: 113 QT Interval:  434 QTC Calculation: 445 R Axis:   59 Text Interpretation:  Sinus rhythm Borderline intraventricular conduction delay Low voltage, precordial leads When compared with ECG of 11/20/2017, No significant change was found Confirmed by Delora Fuel ([id number]) on 07/25/2018 11:13:12 PM   Radiology Dg Chest Port 1 View  Result Date: 07/25/2018 CLINICAL DATA:  Wheezing EXAM: PORTABLE CHEST 1 VIEW COMPARISON:  12/19/2014 FINDINGS: Small bilateral pleural effusions with basilar airspace disease. Cardiomegaly with vascular congestion and underlying pulmonary edema. Aortic atherosclerosis. No pneumothorax. Postsurgical changes in the lower cervical spine. IMPRESSION: 1. Small bilateral pleural effusions with cardiomegaly, vascular congestion and pulmonary edema 2. More confluent airspace disease at both bases, may reflect superimposed atelectasis or pneumonia Electronically Signed   By: Donavan Foil M.D.   On: 07/25/2018 21:09    Procedures Procedures (including critical care time)  Medications Ordered in ED Medications  albuterol (PROVENTIL,VENTOLIN) solution continuous neb (10 mg/hr Nebulization New Bag/Given 07/25/18 2140)  ketorolac (TORADOL) 30  MG/ML injection 30 mg (has no administration in time range)  prochlorperazine (COMPAZINE) injection 10 mg (has no administration in time range)  ipratropium (ATROVENT) nebulizer solution 0.5 mg (0.5 mg Nebulization Given 07/25/18 2140)  ondansetron  (ZOFRAN) injection 4 mg (4 mg Intravenous Given 07/25/18 2132)  cefTRIAXone (ROCEPHIN) 1 g in sodium chloride 0.9 % 100 mL IVPB (0 g Intravenous Stopped 07/25/18 2202)  doxycycline (VIBRA-TABS) tablet 100 mg (100 mg Oral Given 07/25/18 2132)     Initial Impression / Assessment and Plan / ED Course  I have reviewed the triage vital signs and the nursing notes.  Pertinent labs & imaging results that were available during my care of the patient were reviewed by me and considered in my medical decision making (see chart for details).     Patient is a 78 year old female who presents with above-stated history exam.  On presentation patient is afebrile stable vital signs.  Exam as above remarkable for tachypnea with bilateral wheezes and right lower lobe rhonchi.  EKG shows sinus rhythm with a ventricular rate of 63 no significant changes compared to prior tracings with normal intervals and no signs of acute ischemic change.  Troponin 0 0.01.  I have low suspicion for ACS.  History exam is not consistent with acute PE or dissection.  Chest x-ray is concerning for right lower lobe pneumonia with focal consolidation as well as bilateral pulmonary congestion.  BNP is 682.3 further concerning for an element of volume overload contributing to the patient's dyspnea.  CBC shows a WBC count of 14.1, hemoglobin of 7.4, platelets 2 97.  This appears to be an acute hemoglobin drop from 9.1 approximately months ago.  However no gross blood is seen on rectal exam.  Type and screen sent.  BMP shows NA of 110, K of 5.1, CO2 of 20, glucose of 323, creatinine of 1.11 with an AG of 12.  Influenza screen is negative.  Patient given breathing treatments in the emergency department she received Solu-Medrol the EMS prior to arrival.  In addition she received a dose of IV antibiotics and half a liter of isotonic IV fluid.  Patient in stable condition for further evaluation management of pneumonia and hyponatremia as well as significant  respiratory distress.  Final Clinical Impressions(s) / ED Diagnoses   Final diagnoses:  Nausea  Wheezing  Hyponatremia  Lymphocytosis  Community acquired pneumonia of right lower lobe of lung [facility])    ED Discharge Orders    None       Hulan Saas, MD 07/26/18 Judithann Sauger    Quintella Reichert, MD 07/27/18 1231

## 2018-07-26 ENCOUNTER — Inpatient Hospital Stay (HOSPITAL_COMMUNITY): Payer: Medicare Other

## 2018-07-26 DIAGNOSIS — H9190 Unspecified hearing loss, unspecified ear: Secondary | ICD-10-CM | POA: Diagnosis present

## 2018-07-26 DIAGNOSIS — K921 Melena: Secondary | ICD-10-CM | POA: Diagnosis present

## 2018-07-26 DIAGNOSIS — J4541 Moderate persistent asthma with (acute) exacerbation: Secondary | ICD-10-CM | POA: Diagnosis not present

## 2018-07-26 DIAGNOSIS — J181 Lobar pneumonia, unspecified organism: Secondary | ICD-10-CM | POA: Diagnosis not present

## 2018-07-26 DIAGNOSIS — E11628 Type 2 diabetes mellitus with other skin complications: Secondary | ICD-10-CM | POA: Diagnosis not present

## 2018-07-26 DIAGNOSIS — Z90710 Acquired absence of both cervix and uterus: Secondary | ICD-10-CM | POA: Diagnosis not present

## 2018-07-26 DIAGNOSIS — I361 Nonrheumatic tricuspid (valve) insufficiency: Secondary | ICD-10-CM | POA: Diagnosis not present

## 2018-07-26 DIAGNOSIS — I340 Nonrheumatic mitral (valve) insufficiency: Secondary | ICD-10-CM | POA: Diagnosis not present

## 2018-07-26 DIAGNOSIS — I10 Essential (primary) hypertension: Secondary | ICD-10-CM | POA: Diagnosis present

## 2018-07-26 DIAGNOSIS — Z791 Long term (current) use of non-steroidal anti-inflammatories (NSAID): Secondary | ICD-10-CM | POA: Diagnosis not present

## 2018-07-26 DIAGNOSIS — I358 Other nonrheumatic aortic valve disorders: Secondary | ICD-10-CM | POA: Diagnosis not present

## 2018-07-26 DIAGNOSIS — B59 Pneumocystosis: Secondary | ICD-10-CM | POA: Diagnosis not present

## 2018-07-26 DIAGNOSIS — E119 Type 2 diabetes mellitus without complications: Secondary | ICD-10-CM

## 2018-07-26 DIAGNOSIS — J189 Pneumonia, unspecified organism: Secondary | ICD-10-CM | POA: Diagnosis present

## 2018-07-26 DIAGNOSIS — E861 Hypovolemia: Secondary | ICD-10-CM | POA: Diagnosis present

## 2018-07-26 DIAGNOSIS — R0602 Shortness of breath: Secondary | ICD-10-CM | POA: Diagnosis present

## 2018-07-26 DIAGNOSIS — E871 Hypo-osmolality and hyponatremia: Secondary | ICD-10-CM | POA: Diagnosis present

## 2018-07-26 DIAGNOSIS — E785 Hyperlipidemia, unspecified: Secondary | ICD-10-CM | POA: Diagnosis present

## 2018-07-26 DIAGNOSIS — Z79899 Other long term (current) drug therapy: Secondary | ICD-10-CM | POA: Diagnosis not present

## 2018-07-26 DIAGNOSIS — N183 Chronic kidney disease, stage 3 (moderate): Secondary | ICD-10-CM | POA: Diagnosis present

## 2018-07-26 DIAGNOSIS — D509 Iron deficiency anemia, unspecified: Secondary | ICD-10-CM | POA: Diagnosis present

## 2018-07-26 DIAGNOSIS — D72820 Lymphocytosis (symptomatic): Secondary | ICD-10-CM | POA: Diagnosis present

## 2018-07-26 DIAGNOSIS — I5033 Acute on chronic diastolic (congestive) heart failure: Secondary | ICD-10-CM | POA: Diagnosis present

## 2018-07-26 DIAGNOSIS — Z823 Family history of stroke: Secondary | ICD-10-CM | POA: Diagnosis not present

## 2018-07-26 DIAGNOSIS — I130 Hypertensive heart and chronic kidney disease with heart failure and stage 1 through stage 4 chronic kidney disease, or unspecified chronic kidney disease: Secondary | ICD-10-CM | POA: Diagnosis present

## 2018-07-26 DIAGNOSIS — Z8249 Family history of ischemic heart disease and other diseases of the circulatory system: Secondary | ICD-10-CM | POA: Diagnosis not present

## 2018-07-26 DIAGNOSIS — D631 Anemia in chronic kidney disease: Secondary | ICD-10-CM | POA: Diagnosis present

## 2018-07-26 DIAGNOSIS — I081 Rheumatic disorders of both mitral and tricuspid valves: Secondary | ICD-10-CM | POA: Diagnosis present

## 2018-07-26 DIAGNOSIS — I272 Pulmonary hypertension, unspecified: Secondary | ICD-10-CM | POA: Diagnosis not present

## 2018-07-26 DIAGNOSIS — E1122 Type 2 diabetes mellitus with diabetic chronic kidney disease: Secondary | ICD-10-CM | POA: Diagnosis present

## 2018-07-26 DIAGNOSIS — J45901 Unspecified asthma with (acute) exacerbation: Secondary | ICD-10-CM | POA: Diagnosis present

## 2018-07-26 DIAGNOSIS — I330 Acute and subacute infective endocarditis: Secondary | ICD-10-CM | POA: Diagnosis not present

## 2018-07-26 DIAGNOSIS — J9601 Acute respiratory failure with hypoxia: Secondary | ICD-10-CM | POA: Diagnosis present

## 2018-07-26 DIAGNOSIS — Z825 Family history of asthma and other chronic lower respiratory diseases: Secondary | ICD-10-CM | POA: Diagnosis not present

## 2018-07-26 DIAGNOSIS — K920 Hematemesis: Secondary | ICD-10-CM | POA: Diagnosis present

## 2018-07-26 LAB — GLUCOSE, CAPILLARY
GLUCOSE-CAPILLARY: 261 mg/dL — AB (ref 70–99)
Glucose-Capillary: 296 mg/dL — ABNORMAL HIGH (ref 70–99)
Glucose-Capillary: 162 mg/dL — ABNORMAL HIGH (ref 70–99)
Glucose-Capillary: 160 mg/dL — ABNORMAL HIGH (ref 70–99)
Glucose-Capillary: 160 mg/dL — ABNORMAL HIGH (ref 70–99)
Glucose-Capillary: 144 mg/dL — ABNORMAL HIGH (ref 70–99)

## 2018-07-26 LAB — EXPECTORATED SPUTUM ASSESSMENT W GRAM STAIN, RFLX TO RESP C

## 2018-07-26 LAB — I-STAT CHEM 8, ED
BUN: 24 mg/dL — AB (ref 8–23)
Calcium, Ion: 1.11 mmol/L — ABNORMAL LOW (ref 1.15–1.40)
Chloride: 77 mmol/L — ABNORMAL LOW (ref 98–111)
Creatinine, Ser: 1.1 mg/dL — ABNORMAL HIGH (ref 0.44–1.00)
Glucose, Bld: 297 mg/dL — ABNORMAL HIGH (ref 70–99)
HEMATOCRIT: 24 % — AB (ref 36.0–46.0)
Hemoglobin: 8.2 g/dL — ABNORMAL LOW (ref 12.0–15.0)
Potassium: 4.9 mmol/L (ref 3.5–5.1)
Sodium: 108 mmol/L — CL (ref 135–145)
TCO2: 23 mmol/L (ref 22–32)

## 2018-07-26 LAB — SODIUM
Sodium: 110 mmol/L — CL (ref 135–145)
Sodium: 111 mmol/L — CL (ref 135–145)
Sodium: 111 mmol/L — CL (ref 135–145)
Sodium: 112 mmol/L — CL (ref 135–145)
Sodium: 113 mmol/L — CL (ref 135–145)
Sodium: 114 mmol/L — CL (ref 135–145)

## 2018-07-26 LAB — OSMOLALITY
OSMOLALITY: 245 mosm/kg — AB (ref 275–295)
Osmolality: 245 mOsm/kg — CL (ref 275–295)

## 2018-07-26 LAB — CBC
HCT: 22.4 % — ABNORMAL LOW (ref 36.0–46.0)
Hemoglobin: 7.8 g/dL — ABNORMAL LOW (ref 12.0–15.0)
MCH: 27.6 pg (ref 26.0–34.0)
MCHC: 34.8 g/dL (ref 30.0–36.0)
MCV: 79.2 fL — ABNORMAL LOW (ref 80.0–100.0)
NRBC: 0 % (ref 0.0–0.2)
Platelets: 338 10*3/uL (ref 150–400)
RBC: 2.83 MIL/uL — ABNORMAL LOW (ref 3.87–5.11)
RDW: 12.3 % (ref 11.5–15.5)
WBC: 15.1 10*3/uL — AB (ref 4.0–10.5)

## 2018-07-26 LAB — PROTIME-INR
INR: 1.18
Prothrombin Time: 14.9 seconds (ref 11.4–15.2)

## 2018-07-26 LAB — RESPIRATORY PANEL BY PCR
Adenovirus: NOT DETECTED
Bordetella pertussis: NOT DETECTED
CHLAMYDOPHILA PNEUMONIAE-RVPPCR: NOT DETECTED
CORONAVIRUS 229E-RVPPCR: NOT DETECTED
Coronavirus HKU1: NOT DETECTED
Coronavirus NL63: NOT DETECTED
Coronavirus OC43: NOT DETECTED
Influenza A: NOT DETECTED
Influenza B: NOT DETECTED
MYCOPLASMA PNEUMONIAE-RVPPCR: NOT DETECTED
Metapneumovirus: NOT DETECTED
Parainfluenza Virus 1: NOT DETECTED
Parainfluenza Virus 2: NOT DETECTED
Parainfluenza Virus 3: NOT DETECTED
Parainfluenza Virus 4: NOT DETECTED
Respiratory Syncytial Virus: NOT DETECTED
Rhinovirus / Enterovirus: NOT DETECTED

## 2018-07-26 LAB — URINALYSIS, ROUTINE W REFLEX MICROSCOPIC
Bilirubin Urine: NEGATIVE
Glucose, UA: 150 mg/dL — AB
Hgb urine dipstick: NEGATIVE
Ketones, ur: 5 mg/dL — AB
Leukocytes, UA: NEGATIVE
Nitrite: NEGATIVE
Protein, ur: 100 mg/dL — AB
Specific Gravity, Urine: 1.017 (ref 1.005–1.030)
pH: 5 (ref 5.0–8.0)

## 2018-07-26 LAB — LIPID PANEL
Cholesterol: 116 mg/dL (ref 0–200)
HDL: 59 mg/dL (ref >40)
LDL Cholesterol: 47 mg/dL (ref 0–99)
Total CHOL/HDL Ratio: 2 RATIO
Triglycerides: 49 mg/dL (ref <150)
VLDL: 10 mg/dL (ref 0–40)

## 2018-07-26 LAB — PROCALCITONIN: Procalcitonin: 0.49 ng/mL

## 2018-07-26 LAB — ECHOCARDIOGRAM COMPLETE
Height: 66 in
Weight: 2596.14 oz

## 2018-07-26 LAB — MRSA PCR SCREENING: MRSA BY PCR: POSITIVE — AB

## 2018-07-26 LAB — TYPE AND SCREEN
ABO/RH(D): B NEG
Antibody Screen: NEGATIVE

## 2018-07-26 LAB — PHOSPHORUS: Phosphorus: 3.2 mg/dL (ref 2.5–4.6)

## 2018-07-26 LAB — BASIC METABOLIC PANEL
ANION GAP: 12 (ref 5–15)
BUN: 23 mg/dL (ref 8–23)
CO2: 22 mmol/L (ref 22–32)
Calcium: 8.7 mg/dL — ABNORMAL LOW (ref 8.9–10.3)
Chloride: 76 mmol/L — ABNORMAL LOW (ref 98–111)
Creatinine, Ser: 1.13 mg/dL — ABNORMAL HIGH (ref 0.44–1.00)
GFR calc Af Amer: 54 mL/min — ABNORMAL LOW (ref >60)
GFR calc non Af Amer: 46 mL/min — ABNORMAL LOW (ref >60)
Glucose, Bld: 281 mg/dL — ABNORMAL HIGH (ref 70–99)
Potassium: 4.8 mmol/L (ref 3.5–5.1)
Sodium: 110 mmol/L — CL (ref 135–145)

## 2018-07-26 LAB — STREP PNEUMONIAE URINARY ANTIGEN: Strep Pneumo Urinary Antigen: NEGATIVE

## 2018-07-26 LAB — SODIUM, URINE, RANDOM
Sodium, Ur: <10 mmol/L
Sodium, Ur: <10 mmol/L

## 2018-07-26 LAB — EXPECTORATED SPUTUM ASSESSMENT W REFEX TO RESP CULTURE

## 2018-07-26 LAB — TSH: TSH: 0.933 u[IU]/mL (ref 0.350–4.500)

## 2018-07-26 LAB — OSMOLALITY, URINE: Osmolality, Ur: 365 mOsm/kg (ref 300–900)

## 2018-07-26 LAB — CHLORIDE, URINE, RANDOM

## 2018-07-26 LAB — CREATININE, URINE, RANDOM: CREATININE, URINE: 89.01 mg/dL

## 2018-07-26 LAB — MAGNESIUM: MAGNESIUM: 1.5 mg/dL — AB (ref 1.7–2.4)

## 2018-07-26 MED ORDER — SODIUM CHLORIDE 0.9 % IV SOLN
1.0000 g | INTRAVENOUS | Status: DC
  Administered 2018-07-26: 1 g via INTRAVENOUS
  Filled 2018-07-26: qty 10

## 2018-07-26 MED ORDER — MUPIROCIN 2 % EX OINT
1.0000 "application " | TOPICAL_OINTMENT | Freq: Two times a day (BID) | CUTANEOUS | Status: AC
  Administered 2018-07-26 – 2018-07-30 (×10): 1 via NASAL
  Filled 2018-07-26 (×4): qty 22

## 2018-07-26 MED ORDER — GUAIFENESIN 100 MG/5ML PO SOLN
10.0000 mL | ORAL | Status: DC | PRN
  Administered 2018-07-27: 200 mg via ORAL
  Filled 2018-07-26 (×2): qty 10

## 2018-07-26 MED ORDER — MAGNESIUM SULFATE 2 GM/50ML IV SOLN
2.0000 g | Freq: Once | INTRAVENOUS | Status: AC
  Administered 2018-07-26: 2 g via INTRAVENOUS
  Filled 2018-07-26: qty 50

## 2018-07-26 MED ORDER — LOSARTAN POTASSIUM 50 MG PO TABS
100.0000 mg | ORAL_TABLET | Freq: Every day | ORAL | Status: DC
  Filled 2018-07-26: qty 2

## 2018-07-26 MED ORDER — SODIUM CHLORIDE 0.9 % IV SOLN
500.0000 mg | Freq: Every day | INTRAVENOUS | Status: DC
  Administered 2018-07-26 – 2018-07-29 (×4): 500 mg via INTRAVENOUS
  Filled 2018-07-26 (×6): qty 500

## 2018-07-26 MED ORDER — INSULIN ASPART 100 UNIT/ML ~~LOC~~ SOLN
0.0000 [IU] | SUBCUTANEOUS | Status: DC
  Administered 2018-07-26: 8 [IU] via SUBCUTANEOUS
  Administered 2018-07-26 (×2): 3 [IU] via SUBCUTANEOUS
  Administered 2018-07-26: 8 [IU] via SUBCUTANEOUS

## 2018-07-26 MED ORDER — SODIUM CHLORIDE 0.9 % IV SOLN
INTRAVENOUS | Status: DC
  Administered 2018-07-26 (×2): via INTRAVENOUS

## 2018-07-26 MED ORDER — SODIUM CHLORIDE 3 % IV SOLN
INTRAVENOUS | Status: DC
  Administered 2018-07-26 – 2018-07-27 (×2): 30 mL/h via INTRAVENOUS
  Filled 2018-07-26 (×3): qty 500

## 2018-07-26 MED ORDER — INSULIN ASPART 100 UNIT/ML ~~LOC~~ SOLN
0.0000 [IU] | Freq: Three times a day (TID) | SUBCUTANEOUS | Status: DC
  Administered 2018-07-26 – 2018-07-27 (×2): 3 [IU] via SUBCUTANEOUS
  Administered 2018-07-27: 2 [IU] via SUBCUTANEOUS
  Administered 2018-07-27: 3 [IU] via SUBCUTANEOUS
  Administered 2018-07-28 (×2): 5 [IU] via SUBCUTANEOUS
  Administered 2018-07-28: 2 [IU] via SUBCUTANEOUS
  Administered 2018-07-29: 8 [IU] via SUBCUTANEOUS
  Administered 2018-07-29: 5 [IU] via SUBCUTANEOUS
  Administered 2018-07-29 – 2018-07-30 (×3): 3 [IU] via SUBCUTANEOUS
  Administered 2018-07-31 – 2018-08-01 (×2): 2 [IU] via SUBCUTANEOUS
  Administered 2018-08-02 (×2): 5 [IU] via SUBCUTANEOUS

## 2018-07-26 MED ORDER — IPRATROPIUM-ALBUTEROL 0.5-2.5 (3) MG/3ML IN SOLN
3.0000 mL | Freq: Four times a day (QID) | RESPIRATORY_TRACT | Status: DC
  Administered 2018-07-26 – 2018-07-28 (×12): 3 mL via RESPIRATORY_TRACT
  Filled 2018-07-26 (×11): qty 3

## 2018-07-26 MED ORDER — PANTOPRAZOLE SODIUM 40 MG PO TBEC
40.0000 mg | DELAYED_RELEASE_TABLET | Freq: Every day | ORAL | Status: DC
  Administered 2018-07-26 – 2018-08-02 (×8): 40 mg via ORAL
  Filled 2018-07-26 (×9): qty 1

## 2018-07-26 MED ORDER — HEPARIN SODIUM (PORCINE) 5000 UNIT/ML IJ SOLN
5000.0000 [IU] | Freq: Three times a day (TID) | INTRAMUSCULAR | Status: DC
  Administered 2018-07-26 – 2018-08-02 (×21): 5000 [IU] via SUBCUTANEOUS
  Filled 2018-07-26 (×21): qty 1

## 2018-07-26 MED ORDER — DOXAZOSIN MESYLATE 2 MG PO TABS
4.0000 mg | ORAL_TABLET | Freq: Every day | ORAL | Status: DC
  Administered 2018-07-26 – 2018-08-01 (×7): 4 mg via ORAL
  Filled 2018-07-26: qty 1
  Filled 2018-07-26 (×3): qty 2
  Filled 2018-07-26: qty 1
  Filled 2018-07-26 (×3): qty 2
  Filled 2018-07-26: qty 1

## 2018-07-26 MED ORDER — KETOROLAC TROMETHAMINE 30 MG/ML IJ SOLN: 30.0000 mg | Freq: Once | INTRAMUSCULAR | Status: DC

## 2018-07-26 MED ORDER — SIMVASTATIN 20 MG PO TABS
20.0000 mg | ORAL_TABLET | Freq: Every day | ORAL | Status: DC
  Administered 2018-07-26 – 2018-08-01 (×7): 20 mg via ORAL
  Filled 2018-07-26 (×8): qty 1

## 2018-07-26 MED ORDER — PROCHLORPERAZINE EDISYLATE 10 MG/2ML IJ SOLN: 10.0000 mg | Freq: Once | INTRAMUSCULAR | Status: DC

## 2018-07-26 MED ORDER — BUDESONIDE 0.5 MG/2ML IN SUSP
0.5000 mg | Freq: Two times a day (BID) | RESPIRATORY_TRACT | Status: DC
  Administered 2018-07-26 – 2018-08-02 (×15): 0.5 mg via RESPIRATORY_TRACT
  Filled 2018-07-26 (×17): qty 2

## 2018-07-26 MED ORDER — ALBUTEROL SULFATE (2.5 MG/3ML) 0.083% IN NEBU: 2.5000 mg | INHALATION_SOLUTION | RESPIRATORY_TRACT | Status: DC | PRN

## 2018-07-26 MED ORDER — CHLORHEXIDINE GLUCONATE CLOTH 2 % EX PADS
6.0000 | MEDICATED_PAD | Freq: Every day | CUTANEOUS | Status: AC
  Administered 2018-07-27 – 2018-07-30 (×4): 6 via TOPICAL

## 2018-07-26 MED ORDER — CARVEDILOL 25 MG PO TABS
25.0000 mg | ORAL_TABLET | Freq: Two times a day (BID) | ORAL | Status: DC
  Administered 2018-07-26 – 2018-08-02 (×15): 25 mg via ORAL
  Filled 2018-07-26 (×15): qty 1

## 2018-07-26 MED ORDER — AMLODIPINE BESYLATE 5 MG PO TABS
5.0000 mg | ORAL_TABLET | Freq: Every day | ORAL | Status: DC
  Administered 2018-07-26 – 2018-08-02 (×8): 5 mg via ORAL
  Filled 2018-07-26 (×8): qty 1

## 2018-07-26 NOTE — Progress Notes (Signed)
Na level 113 resulted to Dr Elsworth Soho

## 2018-07-26 NOTE — Progress Notes (Signed)
[place] Progress Note Patient Name: Carla Foster DOB: [birth year] MRN: [id number]   Date of Service  07/26/2018  HPI/Events of Note  Severe hyponatremia.  Presented with sodium of 110 at 2056 1/6 and sodium now 112 with normal saline infusion.  eICU Interventions  Stop NS.  Begin 3% saline at 30cc/hr and continue to check sodium every 2 hrs.     Intervention Category Major Interventions: Electrolyte abnormality - evaluation and management  Mauri Brooklyn, P 07/26/2018, 8:31 PM

## 2018-07-26 NOTE — Progress Notes (Signed)
Pt complaining of cough. [place] called and notified. See MAR for new orders. Will continue to monitor closely. Clint Bolder, RN 07/26/18 10:48 PM

## 2018-07-26 NOTE — Progress Notes (Addendum)
NAME:  Carla Foster, MRN:  [id number], DOB:  [birth year], LOS: 0 ADMISSION DATE:  07/25/2018, CONSULTATION DATE:  1/6 REFERRING MD:  Dr. Ralene Bathe EDP, CHIEF COMPLAINT:  Hyponatremia   Brief History   78 year old woman presents 1/6 with one week of shortness of breath, cough productive of purulent sputum, diarrhea and vomiting. On 12/29 she began having symptoms of SOB and cough. On 12/31 she developed diarrhea and presented to her PCP, sx were felt to be related to URI so she was given a dose of steroids. Sx continued to worsen when she returned home, cough became less productive but diarrhea and SOB became accompanied by vomiting and she was no longer able to tolerate po intake.   Chest xray revealed bibasilar infiltrates, bilateral pleural effusions, vascular congestion and pulmonary edema. Labs revealed a sodium of 110 (114 when corrected for glucose) down from 137 when last checked 01/2018. BNP 682, trop 0.01, EKG without acute ischemic changes. Crt 1.1 consistent with baseline. WBC 14.1, flu panel neg, procalcitonin 0.49, and urinalysis was negative for nitrites or leukocytes with only few bacteria. Hemoglobin was 7.4 down from baseline 9, FOBT neg. Blood cultures were collected.  She was started on empiric antibiotics ceftriaxone, doxycycline, and azithromycin for presumed CAP and PCCM was called for admission.   Past Medical History  ? Asthma , HTN, DM, PUD and erosive gastropathy, HLD  Significant Hospital Events   1/6 admission   Consults:  none  Procedures:  None   Significant Diagnostic Tests:  1/6 admit  Micro Data:  Blood 1/7 > Sputum 1/7 > Urine 1/7 > MRSA +  Flu neg   RVP 1/7 > in process   Antimicrobials:  Ceftriaxone 1/6 > Doxycycline 1/6 > 1 dose  Azithromycin 1/7 >  Interim history/subjective:   Started on IV NS overnight, Na 111 this AM   Objective   Blood pressure (!) 133/56, pulse 79, temperature 97.7 F (36.5 C), temperature source Oral, resp. rate 18,  weight 73.6 kg, SpO2 99 %.        Intake/Output Summary (Last 24 hours) at 07/26/2018 4174 Last data filed at 07/26/2018 0600 Gross per 24 hour  Intake 517.94 ml  Output -  Net 517.94 ml   Filed Weights   07/26/18 0223  Weight: 73.6 kg    Examination: General: ill appearing, comfortable  HENT: on [place]  Lungs: bibasilar rhonchi R> L, wheezing Cardiovascular: JVP, 1+ bl lower extremity edema, regular rate and rhythm, no murmur  Abdomen: distended, soft, non tender  Extremities: 1+ bl lower extremity edema  Neuro: awake and alert   Resolved Hospital Problem list     Assessment & Plan:   78 year old woman presenting with cough, shortness of breath, diarrhea and vomiting found to have bibasilar infiltrates and pleural effusions, severe hyponatremia, and acute on chronic anemia.   Severe hypotonic hyponatremia  Pulmonary edema and new bl pleural effusions  - corrected glucose 114 on admission, with serum osm 245 consistent with hypotonic hyponatremia. Urine sodium <10 , urine osmolality 365. She has sign of peripheral edema on exam, weight is up 15 lb from last office visit 6 months ago and chest xray shows bibasilar opacities and left pleural effusion concerning for pulmonary edema. Patient was started on NS infusion and Na is improved to 113 this morning.  - continue low IV fluids - continue to monitor Na every 4 hours - goal NA increase 6-12  in the next 24 hours - follow  up echocardiogram    CAP - cont ctx, azithromycin  - continue pulmicort, PRN albuterol  - follow up RVP, legionella   Acute on chronic Microcytic Anemia Hx of PUD and gastropathy treated with iron  - Hemoglobin stable at 7.4, down from baseline 9, some  - monitor CBC   CKD III  - crt stable at 1.13 - continue to monitor   Hypertension  - blood pressure well controlled on home amlodipine, carvedilol, doxazosin  - continue to hold home HCTZ  -hold losartan   Type 2 DM  - continue to monitor CBG and  treat with ISS   Best practice:  Diet: NPO Pain/Anxiety/Delirium protocol (if indicated): Na VAP protocol (if indicated): Na DVT prophylaxis: subq heparin GI prophylaxis: home protonix 40 mg qd  Glucose control: SSI Mobility: OOB  Code Status: Full Family Communication: Patient and daughters updated in ED Disposition: continue to monitor in ICU  Labs   CBC: Recent Labs  Lab 07/25/18 2056 07/26/18 0014 07/26/18 0145  WBC 14.1*  --  15.1*  NEUTROABS 12.6*  --   --   HGB 7.4* 8.2* 7.8*  HCT 21.9* 24.0* 22.4*  MCV 78.2*  --  79.2*  PLT 297  --  366    Basic Metabolic Panel: Recent Labs  Lab 07/25/18 2056 07/26/18 0010 07/26/18 0014 07/26/18 0145 07/26/18 0304  NA 110* 110* 108* 110* 111*  K 5.1  --  4.9 4.8  --   CL 78*  --  77* 76*  --   CO2 20*  --   --  22  --   GLUCOSE 323*  --  297* 281*  --   BUN 21  --  24* 23  --   CREATININE 1.11*  --  1.10* 1.13*  --   CALCIUM 8.3*  --   --  8.7*  --   MG  --   --   --  1.5*  --   PHOS  --   --   --  3.2  --    GFR: Estimated Creatinine Clearance: 41.4 mL/min (A) (by C-G formula based on SCr of 1.13 mg/dL (H)). Recent Labs  Lab 07/25/18 2056 07/26/18 0010 07/26/18 0145  PROCALCITON  --  0.49  --   WBC 14.1*  --  15.1*    Liver Function Tests: Recent Labs  Lab 07/25/18 2056  AST 39  ALT 25  ALKPHOS 34*  BILITOT 1.3*  PROT 6.1*  ALBUMIN 2.8*   No results for input(s): LIPASE, AMYLASE in the last 168 hours. No results for input(s): AMMONIA in the last 168 hours.  ABG    Component Value Date/Time   TCO2 23 07/26/2018 0014     Coagulation Profile: Recent Labs  Lab 07/26/18 0010  INR 1.18    Cardiac Enzymes: No results for input(s): CKTOTAL, CKMB, CKMBINDEX, TROPONINI in the last 168 hours.  HbA1C: Hgb A1c MFr Bld  Date/Time Value Ref Range Status  11/20/2017 11:45 AM 6.8 (H) 4.8 - 5.6 % Final    Comment:    (NOTE) Pre diabetes:          5.7%-6.4% Diabetes:              >6.4% Glycemic  control for   <7.0% adults with diabetes     CBG: Recent Labs  Lab 07/26/18 0210 07/26/18 0314  GLUCAP 296* 261*    Review of Systems:     Past Medical History  She,  has a past  medical history of Acute blood loss anemia, AKI (acute kidney injury) ([place]), Asthma, Complication of anesthesia, Diabetes mellitus, GI bleed, Hematemesis, Hyperlipidemia, Hypertension, and Melena.   Surgical History    Past Surgical History:  Procedure Laterality Date  . CERVICAL DISCECTOMY  08/23/00   C5-6, C6-7 anterior cervical diskectomy with fibular bone bank fusion followed by Atlantis anterior cervical plating with the operating microscope  --  SURGEON:  Faythe Ghee, M.D.  . ESOPHAGOGASTRODUODENOSCOPY (EGD) WITH PROPOFOL N/A 11/21/2017   Procedure: ESOPHAGOGASTRODUODENOSCOPY (EGD) WITH PROPOFOL;  Surgeon: Gatha Mayer, MD;  Location: [place];  Service: Endoscopy;  Laterality: N/A;  . PARTIAL HYSTERECTOMY       Social History   reports that she quit smoking about 44 years ago. Her smoking use included cigarettes. She has a 18.00 pack-year smoking history. She has never used smokeless tobacco. She reports that she does not drink alcohol or use drugs.   Family History   Her family history includes Atrial fibrillation in her mother; Emphysema in her father; Hypertension in her mother; Stroke in her mother.   Allergies Allergies  Allergen Reactions  . Tiazac [Diltiazem Hcl]     Ankle edema     Home Medications  Prior to Admission medications   Medication Sig Start Date End Date Taking? Authorizing Provider  albuterol (PROVENTIL HFA;VENTOLIN HFA) 108 (90 Base) MCG/ACT inhaler Inhale 2 puffs into the lungs as directed. 01/28/17   [provider]  amLODipine (NORVASC) 5 MG tablet Take 1 tablet (5 mg total) by mouth daily. 09/29/12   Nahser, Wonda Cheng, MD  benzonatate (TESSALON) 100 MG capsule Take 1 capsule (100 mg total) by mouth 2 (two) times daily as needed for cough. 11/23/17    Desiree Hane, MD  calcium carbonate (TUMS - DOSED IN MG ELEMENTAL CALCIUM) 500 MG chewable tablet Chew 1-2 tablets by mouth as needed for indigestion or heartburn.    [provider]  Calcium Carbonate-Vitamin D (CALCIUM 600 + D PO) Take 1 tablet by mouth daily.      [provider]  carvedilol (COREG) 25 MG tablet TAKE 1 TABLET BY MOUTH TWICE A DAY 07/11/18   Nahser, Wonda Cheng, MD  cholecalciferol (VITAMIN D) 1000 UNITS tablet Take 1,000 Units by mouth daily.      [provider]  doxazosin (CARDURA) 4 MG tablet Take 1 tablet (4 mg total) by mouth at bedtime. 02/03/18   Nahser, Wonda Cheng, MD  fenofibrate 160 MG tablet TAKE 1 TABLET BY MOUTH EVERY DAY 12/10/14   Nahser, Wonda Cheng, MD  fish oil-omega-3 fatty acids 1000 MG capsule Take 1 g by mouth daily.      [provider]  Flaxseed, Linseed, (FLAX SEED OIL PO) Take 1 capsule by mouth daily.      [provider]  hydrochlorothiazide (HYDRODIURIL) 25 MG tablet Take 25 mg by mouth daily.  01/10/16   [provider]  ibuprofen (ADVIL,MOTRIN) 600 MG tablet Take 600 mg by mouth every 6 (six) hours as needed. 05/14/17   [provider]  losartan (COZAAR) 100 MG tablet Take 1 tablet (100 mg total) by mouth daily. 01/11/18   Nahser, Wonda Cheng, MD  metFORMIN (GLUCOPHAGE) 1000 MG tablet Take 1,000 mg by mouth daily.     [provider]  pantoprazole (PROTONIX) 40 MG tablet Take 1 tablet (40 mg total) by mouth daily before breakfast. 11/24/17   Desiree Hane, MD  Propylene Glycol (SYSTANE BALANCE) 0.6 % SOLN Apply 1  drop to eye as needed (dry eyes).    [provider]  simvastatin (ZOCOR) 20 MG tablet Take 1 tablet (20 mg total) by mouth at bedtime. 12/28/11   Nahser, Wonda Cheng, MD  vitamin A 8000 UNIT capsule Take 1 capsule by mouth daily. 08/27/15   [provider]  vitamin C (ASCORBIC ACID) 500 MG tablet Take 500 mg by mouth daily.      [provider]         Independently examined pt, evaluated data & formulated above care plan with resident   78 year old admitted with one-week illness found to have community-acquired pneumonia and sodium of 110, 114 when corrected for glucose She did not have any CNS symptoms. On exam-clear breath sounds, faint expiratory rhonchi, 1+ edema, no JVD S1-S2 normal, soft nontender abdomen.  Respiratory viral panel negative Chest x-ray personally reviewed shows bibasal consolidation and perhaps small left effusion.  Impression/plan Hyponatremia-likely combination of diarrhea, thiazide and poor oral intake Subacute given a week's illness Goal would be gradual increase of sodium not more than 122 x 6 a.m. tomorrow Okay to obtain echocardiogram given difficult to assess volume status Normal saline to continue at 50 cc an hour unless any signs of fluid overload   Community-acquired pneumonia -ceftriaxone and azithromycin awaiting urine strep antigen  Keep in ICU until sodium improves   V. Elsworth Soho MD

## 2018-07-26 NOTE — Progress Notes (Signed)
CRITICAL VALUE ALERT  Critical Value:  Na-112  Date & Time Notified:  07/26/2018 @ 2019  Provider Notified: Lawernce Ion  Orders Received/Actions taken: See [facility] for new orders. Will continue to monitor closely.  Clint Bolder, RN 07/26/18 8:24 PM

## 2018-07-26 NOTE — Progress Notes (Signed)
CRITICAL VALUE ALERT  Critical Value:  Serum Osmolality-245, Na-112  Date & Time Notified:  07/26/2018  Provider Notified: Warren Lacy- Dr. Tamala Julian  Orders Received/Actions taken: Continue 3% hypertonic solution. Recheck Na Q2. Will continue to monitor closely.  Clint Bolder, RN 07/26/18 11:54 PM

## 2018-07-26 NOTE — Progress Notes (Signed)
Na 111 at 1320 this is down from 114 at 0900. Patient is still mentating well and free of symptoms of hyponatremia. There are no signs of volume overload. Currently she is on NS at 50 cc/hr. Will follow up Na 2 hours from prior, if it has trended down we will increase the rate of the normal saline.

## 2018-07-26 NOTE — Progress Notes (Signed)
Na level 111 reported to Ledell Noss MD

## 2018-07-26 NOTE — Progress Notes (Signed)
Na level 114 reported to Dr Elsworth Soho 1500 from 1100

## 2018-07-26 NOTE — Progress Notes (Signed)
[facility] Progress Note Patient Name: Carla Foster DOB: 30-Dec-1940 MRN: [id number]   Date of Service  07/26/2018  HPI/Events of Note  Multiple issues: 1. Na+ = 111, 2. Mg++ = 1.5 and Creatinine = 1.13 and 3. Oliguria - Bladder scan with 486 mL.   eICU Interventions  Will order: 1. Replace Mg++. 2. Continue to monitor Na+. 3. I/O Cath PRN.      Intervention Category Major Interventions: Electrolyte abnormality - evaluation and management  Konstantinos Cordoba Eugene 07/26/2018, 5:11 AM

## 2018-07-26 NOTE — Progress Notes (Signed)
Patient stated that she has not urinated since around 1700 1/6 and does not feel that she needs to urinate. RN bladder scanned patient for 435 ml. [place] RN notified and will notify Elink MD. Waiting for orders.

## 2018-07-26 NOTE — Progress Notes (Signed)
I/O patient for 460 ml

## 2018-07-26 NOTE — Progress Notes (Signed)
CRITICAL VALUE ALERT  Critical Value: Osmolality   Date & Time Notied: 07/26/18 04:00  Provider Notified: Warren Lacy RN   Orders Received/Actions taken: RN will notify Elink MD.

## 2018-07-26 NOTE — Progress Notes (Signed)
CRITICAL VALUE ALERT  Critical Value: Sodium   Date & Time Notied: 07/26/18  05:00  Provider Notified: Elink RN   Orders Received/Actions taken: No orders given

## 2018-07-27 ENCOUNTER — Inpatient Hospital Stay (HOSPITAL_COMMUNITY): Payer: Medicare Other

## 2018-07-27 DIAGNOSIS — I272 Pulmonary hypertension, unspecified: Secondary | ICD-10-CM

## 2018-07-27 LAB — CBC
HCT: 21.9 % — ABNORMAL LOW (ref 36.0–46.0)
Hemoglobin: 7.4 g/dL — ABNORMAL LOW (ref 12.0–15.0)
MCH: 26.2 pg (ref 26.0–34.0)
MCHC: 33.8 g/dL (ref 30.0–36.0)
MCV: 77.7 fL — ABNORMAL LOW (ref 80.0–100.0)
Platelets: 337 10*3/uL (ref 150–400)
RBC: 2.82 MIL/uL — ABNORMAL LOW (ref 3.87–5.11)
RDW: 12.5 % (ref 11.5–15.5)
WBC: 21.4 10*3/uL — ABNORMAL HIGH (ref 4.0–10.5)
nRBC: 0.1 % (ref 0.0–0.2)

## 2018-07-27 LAB — URINE CULTURE: Culture: NO GROWTH

## 2018-07-27 LAB — BASIC METABOLIC PANEL
Anion gap: 9 (ref 5–15)
BUN: 26 mg/dL — ABNORMAL HIGH (ref 8–23)
CO2: 21 mmol/L — ABNORMAL LOW (ref 22–32)
Calcium: 8.2 mg/dL — ABNORMAL LOW (ref 8.9–10.3)
Chloride: 84 mmol/L — ABNORMAL LOW (ref 98–111)
Creatinine, Ser: 1.12 mg/dL — ABNORMAL HIGH (ref 0.44–1.00)
GFR calc Af Amer: 54 mL/min — ABNORMAL LOW (ref >60)
GFR calc non Af Amer: 47 mL/min — ABNORMAL LOW (ref >60)
GLUCOSE: 145 mg/dL — AB (ref 70–99)
Potassium: 5 mmol/L (ref 3.5–5.1)
Sodium: 114 mmol/L — CL (ref 135–145)

## 2018-07-27 LAB — GLUCOSE, CAPILLARY
Glucose-Capillary: 143 mg/dL — ABNORMAL HIGH (ref 70–99)
Glucose-Capillary: 143 mg/dL — ABNORMAL HIGH (ref 70–99)
Glucose-Capillary: 147 mg/dL — ABNORMAL HIGH (ref 70–99)
Glucose-Capillary: 149 mg/dL — ABNORMAL HIGH (ref 70–99)
Glucose-Capillary: 151 mg/dL — ABNORMAL HIGH (ref 70–99)

## 2018-07-27 LAB — SODIUM
SODIUM: 119 mmol/L — AB (ref 135–145)
Sodium: 112 mmol/L — CL (ref 135–145)
Sodium: 114 mmol/L — CL (ref 135–145)
Sodium: 116 mmol/L — CL (ref 135–145)
Sodium: 118 mmol/L — CL (ref 135–145)
Sodium: 121 mmol/L — ABNORMAL LOW (ref 135–145)
Sodium: 121 mmol/L — ABNORMAL LOW (ref 135–145)

## 2018-07-27 MED ORDER — ADULT MULTIVITAMIN W/MINERALS CH
1.0000 | ORAL_TABLET | Freq: Every day | ORAL | Status: DC
  Administered 2018-07-27 – 2018-08-02 (×7): 1 via ORAL
  Filled 2018-07-27 (×7): qty 1

## 2018-07-27 MED ORDER — FUROSEMIDE 10 MG/ML IJ SOLN
20.0000 mg | Freq: Once | INTRAMUSCULAR | Status: AC
  Administered 2018-07-27: 20 mg via INTRAVENOUS
  Filled 2018-07-27: qty 2

## 2018-07-27 MED ORDER — ORAL CARE MOUTH RINSE
15.0000 mL | Freq: Two times a day (BID) | OROMUCOSAL | Status: DC
  Administered 2018-07-27 – 2018-08-02 (×8): 15 mL via OROMUCOSAL

## 2018-07-27 MED ORDER — ACETAMINOPHEN 325 MG PO TABS
650.0000 mg | ORAL_TABLET | ORAL | Status: DC | PRN
  Administered 2018-07-27 – 2018-08-01 (×6): 650 mg via ORAL
  Filled 2018-07-27 (×7): qty 2

## 2018-07-27 MED ORDER — SODIUM CHLORIDE 0.9 % IV SOLN
2.0000 g | INTRAVENOUS | Status: DC
  Administered 2018-07-27 – 2018-07-28 (×2): 2 g via INTRAVENOUS
  Filled 2018-07-27 (×3): qty 20

## 2018-07-27 MED ORDER — ENSURE ENLIVE PO LIQD
237.0000 mL | Freq: Two times a day (BID) | ORAL | Status: DC
  Administered 2018-07-27 – 2018-08-02 (×11): 237 mL via ORAL

## 2018-07-27 MED ORDER — ENSURE ENLIVE PO LIQD: 237.0000 mL | Freq: Two times a day (BID) | ORAL | Status: DC

## 2018-07-27 NOTE — Progress Notes (Signed)
78 year old admitted with one-week illness  With  community-acquired pneumonia and sodium of 110, 114 when corrected for glucose She did not have any CNS symptoms.  She was placed on normal saline with no change in sodium for 12 hours, she was then changed to 3% saline at 30 cc an hour around 9 PM, sodium has gradually improved to 119.  Today she is more lethargic but arousable and follows commands and interacts, faint expiratory rhonchi bilateral although no respiratory distress, no JVD , does have 1+ pedal edema  Echo shows elevated RVSP at 80 with grade 2 diastolic dysfunction normal LV SF Chest x-ray personally reviewed which shows increasing bilateral infiltrates in the pattern of airspace disease rather than edema  Labs show increased leukocytosis, anemia, mild hyperkalemia.  Impression/plan Subacute hyponatremia-we will continue 3% saline until sodium corrects to 120 and above and then will discontinue  Community-acquired pneumonia-continue ceftriaxone and azithromycin, cultures and respiratory viral panel negative, urine strep antigen was negative  Acute on chronic diastolic heart failure with pulmonary hypertension-with worsening chest x-ray and expiratory rhonchi, give Lasix 20 mg IV x1  My independent critical care time x 60m  Kara Mead MD. FCCP. [facility] Pager [phone] If no response call [phone]   07/27/2018

## 2018-07-27 NOTE — Progress Notes (Signed)
Na+ level 116 level resulted to Dr Elsworth Soho on rounds

## 2018-07-27 NOTE — Progress Notes (Addendum)
Initial Nutrition Assessment  DOCUMENTATION CODES:   Not applicable  INTERVENTION:    Ensure Enlive po BID, each supplement provides 350 kcal and 20 grams of protein  Multivitamin daily  NUTRITION DIAGNOSIS:   Inadequate oral intake related to poor appetite, nausea as evidenced by meal completion < 50%.  GOAL:   Patient will meet greater than or equal to 90% of their needs  MONITOR:   PO intake, Supplement acceptance  REASON FOR ASSESSMENT:   Malnutrition Screening Tool    ASSESSMENT:   78 yo female with PMH of asthma, HTN, DM, PUD, erosive gastropathy, and HLD who was admitted on 1/6 with CAP, vomiting, diarrhea, hyponatremia, MRSA positive.   Patient sleepy during RD visit. She says that she has been eating poorly since yesterday due to a poor appetite. Noted N/V PTA. She drinks Ensure sometimes. PO meal completion 30-40-25% yesterday.   Labs reviewed. Sodium 116 (L) CBG's: [phone] Medications reviewed and include Lasix, Novolog.   NUTRITION - FOCUSED PHYSICAL EXAM:    Most Recent Value  Orbital Region  No depletion  Upper Arm Region  No depletion  Thoracic and Lumbar Region  No depletion  Buccal Region  Mild depletion  Temple Region  Mild depletion  Clavicle Bone Region  No depletion  Clavicle and Acromion Bone Region  No depletion  Scapular Bone Region  No depletion  Dorsal Hand  Mild depletion  Patellar Region  No depletion  Anterior Thigh Region  No depletion  Posterior Calf Region  Mild depletion  Edema (RD Assessment)  Mild  Hair  Reviewed  Eyes  Reviewed  Mouth  Reviewed  Skin  Reviewed  Nails  Reviewed       Diet Order:   Diet Order            Diet Carb Modified Fluid consistency: Thin; Room service appropriate? Yes  Diet effective now              EDUCATION NEEDS:   No education needs have been identified at this time  Skin:  Skin Assessment: Reviewed RN Assessment  Last BM:  unknown  Height:   Ht Readings from Last  1 Encounters:  07/26/18 5\' 6"  (1.676 m)    Weight:   Wt Readings from Last 1 Encounters:  07/27/18 75.8 kg    Ideal Body Weight:  59.1 kg  BMI:  Body mass index is 26.97 kg/m.  Estimated Nutritional Needs:   Kcal:  1650-1850  Protein:  75-90 gm  Fluid:  1.6-1.8 L    Molli Barrows, RD, LDN, [place] Pager [phone] After Hours Pager [phone]

## 2018-07-27 NOTE — Progress Notes (Addendum)
NAME:  Carla Foster, MRN:  [id number], DOB:  April 06, 1941, LOS: 0 ADMISSION DATE:  07/25/2018, CONSULTATION DATE:1/6 REFERRING MD:Dr. Ralene Bathe EDP, CHIEF COMPLAINT:Hyponatremia  Brief History   78 year old woman presents 1/6 with one week of shortness of breath, cough productive of purulent sputum, diarrhea and vomiting. On 12/29 she began having symptoms of SOB and cough. On 12/31 she developed diarrhea and presented to her PCP, sx were felt to be related to URI so she was given a dose of steroids. Sx continued to worsen when she returned home, cough became less productive but diarrhea and SOB became accompanied by vomiting and she was no longer able to tolerate po intake.   Chest xray revealed bibasilar infiltrates, bilateral pleural effusions, vascular congestion and pulmonary edema. Labs revealed a sodium of 110 (114 when corrected for glucose) down from 137 when last checked 01/2018. BNP 682, trop 0.01, EKG without acute ischemic changes. Crt 1.1 consistent with baseline. WBC 14.1, flu panel neg, procalcitonin 0.49, and urinalysis was negative for nitrites or leukocytes with only few bacteria. Hemoglobin was 7.4 down from baseline 9, FOBT neg. Blood cultures were collected.  She was started on empiric antibiotics ceftriaxone, doxycycline, and azithromycin for presumed CAP and PCCM was called for admission.   Past Medical History  ? Asthma , HTN, DM, PUD and erosive gastropathy, HLD  Significant Hospital Events   1/6 admission   Consults:  none  Procedures:  None   Significant Diagnostic Tests:  1/7 echo - EF 60, g2DD, large mobile mass in the aortic valve, pulmonary pressure 80 mmHg, dilated non compressible IVC    Micro Data:  Blood 1/7 > NGTD  Sputum 1/7 > GPC, culture in process   Urine 1/7 > neg MRSA +  Flu neg   RVP 1/7 > neg  Antimicrobials:  Ceftriaxone 1/6 > Doxycycline 1/6 > 1 dose  Azithromycin 1/7 >  Interim history/subjective:   Provided  hypertonic saline overnight   Objective   Blood pressure (!) 133/56, pulse 79, temperature 97.7 F (36.5 C), temperature source Oral, resp. rate 18, weight 73.6 kg, SpO2 99 %.        Intake/Output Summary (Last 24 hours) at 07/26/2018 0569 Last data filed at 07/26/2018 0600    Gross per 24 hour  Intake 517.94 ml  Output -  Net 517.94 ml      Filed Weights   07/26/18 0223  Weight: 73.6 kg    Examination: General: ill appearing, comfortable  HENT: on [place]  Lungs: wheezing, rhonchi not appreciated  Cardiovascular: JVP, 1+ bl lower extremity edema, regular rate and rhythm, no murmur  Abdomen: distended, soft, non tender  Extremities: 1+ bl lower extremity edema  Neuro: more fatigued then prior, oriented x3   Resolved Hospital Problem list     Assessment & Plan:   79 year old woman presenting with cough, shortness of breath, diarrhea and vomiting found to have bibasilar infiltrates and pleural effusion, severe hyponatremia, and acute on chronic anemia.   Severe hypotonic hyponatremia  Pulmonary edema and new bl pleural effusions  Corrected glucose 114 on admission, with serum osm 245 consistent with hypotonic hyponatremia. Urine sodium <10 , urine osmolality 365 suggestive of SIADH which could be a result of her pulmonary process. She has sign of peripheral edema on exam, weight is up 15 lb from last office visit 6 months ago and chest xray shows bibasilar opacities and left pleural effusion concerning for pulmonary edema and Echo confirmed volume overload.  Patient was started on NS infusion and Na is improved to 113 this morning.  - Na improving on 3% NS overnight  - continue to monitor Na every 4 hours  - goal NA increase to 122  in the next 24 hours  Pulmonary Hypertension Diastolic left ventricular failure  - Echocardiogram yesterday revealed normal LV systolic function with LV diastolic dysfunction, mild mitral regurg and peak pulmonary artery pressure of 80 mm Hg,  the IVC was dilated and non compressible as well.  - will diurese with IV lasix 20 mg x1, may redose this afternoon   Mobile density of the Aortic valve  TTE yesterday revealed a mobile density on the aortic valve. Blood cultures ngtd and no stigmata of endocarditis.   - consider TEE when stable   CAP with negative RVP  - cont ctx, azithromycin  - continue pulmicort, PRN albuterol  - follow up  legionella antigen   Acute on chronic Microcytic Anemia Hx of PUD and gastropathy treated with iron  - Hemoglobin stable at 7.4, down from baseline 9 but stable from admission. Does report BRBPR prior to admission  - monitor CBC   CKD III  - crt stable at 1.1 - continue to monitor   Hypertension  - blood pressure well controlled on home amlodipine, carvedilol, doxazosin  - continue to hold home HCTZ, Losartan   Type 2 DM  - continue to monitor CBG and treat with ISS   Best practice:  Diet:NPO Pain/Anxiety/Delirium protocol (if indicated):Na VAP protocol (if indicated):Na DVT prophylaxis: subqheparin GI prophylaxis:home protonix 40 mg qd  Glucose control:SSI Mobility:OOB  Code Status:Full Family Communication:Patient and daughters updated in ED Disposition: continue to monitor inICU  Labs   CBC: LastLabs       Recent Labs  Lab 07/25/18 2056 07/26/18 0014 07/26/18 0145  WBC 14.1*  --  15.1*  NEUTROABS 12.6*  --   --   HGB 7.4* 8.2* 7.8*  HCT 21.9* 24.0* 22.4*  MCV 78.2*  --  79.2*  PLT 297  --  338      Basic Metabolic Panel: LastLabs         Recent Labs  Lab 07/25/18 2056 07/26/18 0010 07/26/18 0014 07/26/18 0145 07/26/18 0304  NA 110* 110* 108* 110* 111*  K 5.1  --  4.9 4.8  --   CL 78*  --  77* 76*  --   CO2 20*  --   --  22  --   GLUCOSE 323*  --  297* 281*  --   BUN 21  --  24* 23  --   CREATININE 1.11*  --  1.10* 1.13*  --   CALCIUM 8.3*  --   --  8.7*  --   MG  --   --   --  1.5*  --   PHOS  --   --   --  3.2  --       GFR: Estimated Creatinine Clearance: 41.4 mL/min (A) (by C-G formula based on SCr of 1.13 mg/dL (H)). LastLabs       Recent Labs  Lab 07/25/18 2056 07/26/18 0010 07/26/18 0145  PROCALCITON  --  0.49  --   WBC 14.1*  --  15.1*      Liver Function Tests: LastLabs     Recent Labs  Lab 07/25/18 2056  AST 39  ALT 25  ALKPHOS 34*  BILITOT 1.3*  PROT 6.1*  ALBUMIN 2.8*     LastLabs  No  results for input(s): LIPASE, AMYLASE in the last 168 hours.   LastLabs  No results for input(s): AMMONIA in the last 168 hours.    ABG Labs(Brief)          Component Value Date/Time   TCO2 23 07/26/2018 0014       Coagulation Profile: LastLabs     Recent Labs  Lab 07/26/18 0010  INR 1.18      Cardiac Enzymes: LastLabs  No results for input(s): CKTOTAL, CKMB, CKMBINDEX, TROPONINI in the last 168 hours.    HbA1C: LastLabs         Hgb A1c MFr Bld  Date/Time Value Ref Range Status  11/20/2017 11:45 AM 6.8 (H) 4.8 - 5.6 % Final    Comment:    (NOTE) Pre diabetes:          5.7%-6.4% Diabetes:              >6.4% Glycemic control for   <7.0% adults with diabetes       CBG: LastLabs      Recent Labs  Lab 07/26/18 0210 07/26/18 0314  GLUCAP 296* 261*      Review of Systems:     Past Medical History  She,  has a past medical history of Acute blood loss anemia, AKI (acute kidney injury) ([place]), Asthma, Complication of anesthesia, Diabetes mellitus, GI bleed, Hematemesis, Hyperlipidemia, Hypertension, and Melena.   Surgical History         Past Surgical History:  Procedure Laterality Date  . CERVICAL DISCECTOMY  08/23/00   C5-6, C6-7 anterior cervical diskectomy with fibular bone bank fusion followed by [facility] anterior cervical plating with the operating microscope  --  SURGEON:  Faythe Ghee, M.D.  . ESOPHAGOGASTRODUODENOSCOPY (EGD) WITH PROPOFOL N/A 11/21/2017   Procedure: ESOPHAGOGASTRODUODENOSCOPY (EGD)  WITH PROPOFOL;  Surgeon: Gatha Mayer, MD;  Location: [facility];  Service: Endoscopy;  Laterality: N/A;  . PARTIAL HYSTERECTOMY       Social History   reports that she quit smoking about 44 years ago. Her smoking use included cigarettes. She has a 18.00 pack-year smoking history. She has never used smokeless tobacco. She reports that she does not drink alcohol or use drugs.   Family History   Her family history includes Atrial fibrillation in her mother; Emphysema in her father; Hypertension in her mother; Stroke in her mother.   Allergies      Allergies  Allergen Reactions  . Tiazac [Diltiazem Hcl]     Ankle edema     Home Medications         Prior to Admission medications   Medication Sig Start Date End Date Taking? Authorizing Provider  albuterol (PROVENTIL HFA;VENTOLIN HFA) 108 (90 Base) MCG/ACT inhaler Inhale 2 puffs into the lungs as directed. 01/28/17   [provider]  amLODipine (NORVASC) 5 MG tablet Take 1 tablet (5 mg total) by mouth daily. 09/29/12   Nahser, Wonda Cheng, MD  benzonatate (TESSALON) 100 MG capsule Take 1 capsule (100 mg total) by mouth 2 (two) times daily as needed for cough. 11/23/17   Desiree Hane, MD  calcium carbonate (TUMS - DOSED IN MG ELEMENTAL CALCIUM) 500 MG chewable tablet Chew 1-2 tablets by mouth as needed for indigestion or heartburn.    [provider]  Calcium Carbonate-Vitamin D (CALCIUM 600 + D PO) Take 1 tablet by mouth daily.      [provider]  carvedilol (COREG) 25 MG tablet TAKE 1 TABLET BY MOUTH TWICE  A DAY 07/11/18   Nahser, Wonda Cheng, MD  cholecalciferol (VITAMIN D) 1000 UNITS tablet Take 1,000 Units by mouth daily.      [provider]  doxazosin (CARDURA) 4 MG tablet Take 1 tablet (4 mg total) by mouth at bedtime. 02/03/18   Nahser, Wonda Cheng, MD  fenofibrate 160 MG tablet TAKE 1 TABLET BY MOUTH EVERY DAY 12/10/14   Nahser, Wonda Cheng, MD  fish oil-omega-3 fatty acids  1000 MG capsule Take 1 g by mouth daily.      [provider]  Flaxseed, Linseed, (FLAX SEED OIL PO) Take 1 capsule by mouth daily.      [provider]  hydrochlorothiazide (HYDRODIURIL) 25 MG tablet Take 25 mg by mouth daily.  01/10/16   [provider]  ibuprofen (ADVIL,MOTRIN) 600 MG tablet Take 600 mg by mouth every 6 (six) hours as needed. 05/14/17   [provider]  losartan (COZAAR) 100 MG tablet Take 1 tablet (100 mg total) by mouth daily. 01/11/18   Nahser, Wonda Cheng, MD  metFORMIN (GLUCOPHAGE) 1000 MG tablet Take 1,000 mg by mouth daily.     [provider]  pantoprazole (PROTONIX) 40 MG tablet Take 1 tablet (40 mg total) by mouth daily before breakfast. 11/24/17   Oretha Milch D, MD  Propylene Glycol (SYSTANE BALANCE) 0.6 % SOLN Apply 1 drop to eye as needed (dry eyes).    [provider]  simvastatin (ZOCOR) 20 MG tablet Take 1 tablet (20 mg total) by mouth at bedtime. 12/28/11   Nahser, Wonda Cheng, MD  vitamin A 8000 UNIT capsule Take 1 capsule by mouth daily. 08/27/15   [provider]  vitamin C (ASCORBIC ACID) 500 MG tablet Take 500 mg by mouth daily.      [provider]

## 2018-07-27 NOTE — Progress Notes (Signed)
CRITICAL VALUE ALERT  Critical Value:  Na-114  Date & Time Notified:  07/27/2018 @ 03:07am  Provider Notified: Warren Lacy, Dr. Tamala Julian  Orders Received/Actions taken: Continue to check Na levels Q2 while on 3% hypertonic solution.  Clint Bolder, RN 07/27/18 3:53 AM

## 2018-07-27 NOTE — Progress Notes (Signed)
Pt stating she can't sleep, requesting sleep aid. [facility] RN notified. Will await new orders. See MAR. Clint Bolder, RN 07/27/18 1:29 AM

## 2018-07-27 NOTE — Progress Notes (Signed)
Na+ level 119 resulted to Angus Palms MD

## 2018-07-28 ENCOUNTER — Inpatient Hospital Stay (HOSPITAL_COMMUNITY): Payer: Medicare Other

## 2018-07-28 LAB — BASIC METABOLIC PANEL
Anion gap: 7 (ref 5–15)
Anion gap: 7 (ref 5–15)
BUN: 26 mg/dL — ABNORMAL HIGH (ref 8–23)
BUN: 31 mg/dL — ABNORMAL HIGH (ref 8–23)
CHLORIDE: 93 mmol/L — AB (ref 98–111)
CO2: 24 mmol/L (ref 22–32)
CO2: 25 mmol/L (ref 22–32)
CREATININE: 1.13 mg/dL — AB (ref 0.44–1.00)
Calcium: 8.3 mg/dL — ABNORMAL LOW (ref 8.9–10.3)
Calcium: 8.3 mg/dL — ABNORMAL LOW (ref 8.9–10.3)
Chloride: 92 mmol/L — ABNORMAL LOW (ref 98–111)
Creatinine, Ser: 1.03 mg/dL — ABNORMAL HIGH (ref 0.44–1.00)
GFR calc Af Amer: 60 mL/min — ABNORMAL LOW (ref >60)
GFR calc non Af Amer: 52 mL/min — ABNORMAL LOW (ref >60)
GFR calc non Af Amer: 46 mL/min — ABNORMAL LOW (ref >60)
GFR, EST AFRICAN AMERICAN: 54 mL/min — AB (ref >60)
Glucose, Bld: 233 mg/dL — ABNORMAL HIGH (ref 70–99)
Glucose, Bld: 239 mg/dL — ABNORMAL HIGH (ref 70–99)
Potassium: 4.8 mmol/L (ref 3.5–5.1)
Potassium: 5.1 mmol/L (ref 3.5–5.1)
Sodium: 124 mmol/L — ABNORMAL LOW (ref 135–145)
Sodium: 124 mmol/L — ABNORMAL LOW (ref 135–145)

## 2018-07-28 LAB — SODIUM
Sodium: 122 mmol/L — ABNORMAL LOW (ref 135–145)
Sodium: 122 mmol/L — ABNORMAL LOW (ref 135–145)

## 2018-07-28 LAB — CBC
HCT: 24.9 % — ABNORMAL LOW (ref 36.0–46.0)
Hemoglobin: 8.4 g/dL — ABNORMAL LOW (ref 12.0–15.0)
MCH: 27.4 pg (ref 26.0–34.0)
MCHC: 33.7 g/dL (ref 30.0–36.0)
MCV: 81.1 fL (ref 80.0–100.0)
Platelets: 425 10*3/uL — ABNORMAL HIGH (ref 150–400)
RBC: 3.07 MIL/uL — ABNORMAL LOW (ref 3.87–5.11)
RDW: 13.6 % (ref 11.5–15.5)
WBC: 16.5 10*3/uL — ABNORMAL HIGH (ref 4.0–10.5)
nRBC: 0 % (ref 0.0–0.2)

## 2018-07-28 LAB — GLUCOSE, CAPILLARY
Glucose-Capillary: 141 mg/dL — ABNORMAL HIGH (ref 70–99)
Glucose-Capillary: 236 mg/dL — ABNORMAL HIGH (ref 70–99)
Glucose-Capillary: 218 mg/dL — ABNORMAL HIGH (ref 70–99)

## 2018-07-28 LAB — LEGIONELLA PNEUMOPHILA SEROGP 1 UR AG: L. pneumophila Serogp 1 Ur Ag: NEGATIVE

## 2018-07-28 MED ORDER — FERROUS SULFATE 325 (65 FE) MG PO TABS
325.0000 mg | ORAL_TABLET | ORAL | Status: DC
  Administered 2018-07-28 – 2018-08-02 (×4): 325 mg via ORAL
  Filled 2018-07-28 (×4): qty 1

## 2018-07-28 MED ORDER — FUROSEMIDE 10 MG/ML IJ SOLN
20.0000 mg | Freq: Once | INTRAMUSCULAR | Status: AC
  Administered 2018-07-28: 20 mg via INTRAVENOUS
  Filled 2018-07-28: qty 2

## 2018-07-28 MED ORDER — IPRATROPIUM-ALBUTEROL 0.5-2.5 (3) MG/3ML IN SOLN
3.0000 mL | Freq: Three times a day (TID) | RESPIRATORY_TRACT | Status: DC
  Administered 2018-07-29 – 2018-08-02 (×13): 3 mL via RESPIRATORY_TRACT
  Filled 2018-07-28 (×12): qty 3

## 2018-07-28 NOTE — Progress Notes (Addendum)
NAME:  Carla Foster, MRN:  [id number], DOB:  [birth year], LOS: 0 ADMISSION DATE:  07/25/2018, CONSULTATION DATE:1/6 REFERRING MD:Dr. Ralene Bathe EDP, CHIEF COMPLAINT:Hyponatremia  Brief History   78 year old woman presents 1/6 with one week of shortness of breath, cough productive of purulent sputum, diarrhea and vomiting. On 12/29 she began having symptoms of SOB and cough. On 12/31 she developed diarrhea and presented to her PCP, sx were felt to be related to URI so she was given a dose of steroids. Sx continued to worsen when she returned home, cough became less productive but diarrhea and SOB became accompanied by vomiting and she was no longer able to tolerate po intake.   Chest xray revealed bibasilar infiltrates, bilateral pleural effusions, vascular congestion and pulmonary edema. Labs revealed a sodium of 110 (114 when corrected for glucose) down from 137 when last checked 01/2018. BNP 682, trop 0.01, EKG without acute ischemic changes. Crt 1.1 consistent with baseline. WBC 14.1, flu panel neg, procalcitonin 0.49, and urinalysis was negative for nitrites or leukocytes with only few bacteria. Hemoglobin was 7.4 down from baseline 9, FOBT neg. Blood cultures were collected.  She was started on empiric antibiotics ceftriaxone, doxycycline, and azithromycin for presumed CAP and PCCM was called for admission.   Past Medical History  ? Asthma , HTN, DM, PUD and erosive gastropathy, HLD  Significant Hospital Events   1/6 admission   Consults:  none  Procedures:  None   Significant Diagnostic Tests:  1/7 echo - EF 60, g2DD, large mobile mass in the aortic valve, pulmonary pressure 80 mmHg, dilated non compressible IVC    Micro Data:  Blood 1/7 > NGTD  Sputum 1/7 > GPC, culture in process   Urine 1/7 > neg MRSA +  Flu neg  Strep pneumo urinary antigen > neg  RVP 1/7 > neg  Antimicrobials:  Ceftriaxone 1/6 > Doxycycline 1/6 > 1 dose  Azithromycin 1/7 >  Interim  history/subjective:   Na improved overnight   CXR with improvement in right sided infiltrates, bl pleural effusions, layering in the right fissure   Objective   Blood pressure (!) 133/56, pulse 79, temperature 97.7 F (36.5 C), temperature source Oral, resp. rate 18, weight 73.6 kg, SpO2 99 %.        Intake/Output Summary (Last 24 hours) at 07/26/2018 1740 Last data filed at 07/26/2018 0600    Gross per 24 hour  Intake 517.94 ml  Output -  Net 517.94 ml      Filed Weights   07/26/18 0223  Weight: 73.6 kg    Examination: General: ill appearing, comfortable  HENT: on [place]  Lungs: lungs are clear, no wheezing or rhonchi  Cardiovascular: JVP, 1+ bl lower extremity edema, regular rate and rhythm, no murmur  Abdomen: distended, soft, non tender  Extremities: trace bl lower extremity edema  Neuro: awake, alert, oriented   Resolved Hospital Problem list     Assessment & Plan:   78 year old woman presenting with cough, shortness of breath, diarrhea and vomiting found to have bibasilar infiltrates and pleural effusion, severe hyponatremia, and acute on chronic anemia.   Severe hypotonic hyponatremia  Pulmonary edema and new bl pleural effusions  Na improved to 122 this morning after hypertonic saline and lasix yesterday. CXR this morning with worsening of the pulmonary edema and improvement in pneumonia. 1.8 L urine output after IV lasix 20 mg yesterday.  - repeat IV lasix 20 mg today  - monitor Na q 12  h  - goal Na 130 by tomorrow at 6 am  - transfer to tele and hospitalist service today   Pulmonary Hypertension Diastolic left ventricular failure  Echocardiogram revealed normal LV systolic function with LV diastolic dysfunction, mild mitral regurg and peak pulmonary artery pressure of 80 mm Hg, the IVC was dilated and non compressible as well.  - will continue to diurese with IV lasix 20 mg   Mobile density of the Aortic valve  TTE revealed a mobile density on the  aortic valve. Blood cultures ngtd and no stigmata of endocarditis.   - consider TEE when stable   CAP with negative RVP  - cont ctx, azithromycin  - continue pulmicort, PRN albuterol  - follow up  legionella antigen   Acute on chronic Microcytic Anemia Hx of PUD and gastropathy treated with iron  - Hemoglobin stable but down from baseline 9 but stable from admission. Does report BRBPR prior to admission  - monitor CBC  -resume home po iron   CKD III  - crt stable - continue to monitor   Hypertension  - blood pressure well controlled on home amlodipine, carvedilol, doxazosin  - continue to hold home HCTZ, Losartan   Type 2 DM  - continue to monitor CBG and treat with ISS   Best practice:  Diet:NPO Pain/Anxiety/Delirium protocol (if indicated):Na VAP protocol (if indicated):Na DVT prophylaxis: subqheparin GI prophylaxis:home protonix 40 mg qd  Glucose control:SSI Mobility:OOB  Code Status:Full Family Communication:Patient and daughters updated in ED Disposition: continue to monitor inICU  Labs   CBC: Last[facility]abs       Recent Labs  Lab 07/25/18 2056 07/26/18 0014 07/26/18 0145  WBC 14.1*  --  15.1*  NEUTROABS 12.6*  --   --   HGB 7.4* 8.2* 7.8*  HCT 21.9* 24.0* 22.4*  MCV 78.2*  --  79.2*  PLT 297  --  338      Basic Metabolic Panel: Last[facility]         Recent Labs  Lab 07/25/18 2056 07/26/18 0010 07/26/18 0014 07/26/18 0145 07/26/18 0304  NA 110* 110* 108* 110* 111*  K 5.1  --  4.9 4.8  --   CL 78*  --  77* 76*  --   CO2 20*  --   --  22  --   GLUCOSE 323*  --  297* 281*  --   BUN 21  --  24* 23  --   CREATININE 1.11*  --  1.10* 1.13*  --   CALCIUM 8.3*  --   --  8.7*  --   MG  --   --   --  1.5*  --   PHOS  --   --   --  3.2  --      GFR: Estimated Creatinine Clearance: 41.4 mL/min (A) (by C-G formula based on SCr of 1.13 mg/dL (H)). Last[facility]       Recent Labs  Lab 07/25/18 2056 07/26/18 0010 07/26/18 0145   PROCALCITON  --  0.49  --   WBC 14.1*  --  15.1*      Liver Function Tests: Last[facility]     Recent Labs  Lab 07/25/18 2056  AST 39  ALT 25  ALKPHOS 34*  BILITOT 1.3*  PROT 6.1*  ALBUMIN 2.8*     [facility]  No results for input(s): LIPASE, AMYLASE in the last 168 hours.   [facility]  No results for input(s): AMMONIA in the last 168 hours.  ABG Labs(Brief)          Component Value Date/Time   TCO2 23 07/26/2018 0014       Coagulation Profile: [facility]     Recent Labs  Lab 07/26/18 0010  INR 1.18      Cardiac Enzymes: [facility]  No results for input(s): CKTOTAL, CKMB, CKMBINDEX, TROPONINI in the last 168 hours.    HbA1C: [facility]         Hgb A1c MFr Bld  Date/Time Value Ref Range Status  11/20/2017 11:45 AM 6.8 (H) 4.8 - 5.6 % Final    Comment:    (NOTE) Pre diabetes:          5.7%-6.4% Diabetes:              >6.4% Glycemic control for   <7.0% adults with diabetes       CBG: [facility]      Recent Labs  Lab 07/26/18 0210 07/26/18 0314  GLUCAP 296* 261*      Review of Systems:     Past Medical History  She,  has a past medical history of Acute blood loss anemia, AKI (acute kidney injury) ([place]), Asthma, Complication of anesthesia, Diabetes mellitus, GI bleed, Hematemesis, Hyperlipidemia, Hypertension, and Melena.   Surgical History         Past Surgical History:  Procedure Laterality Date  . CERVICAL DISCECTOMY  08/23/00   C5-6, C6-7 anterior cervical diskectomy with fibular bone bank fusion followed by [facility] anterior cervical plating with the operating microscope  --  SURGEON:  Faythe Ghee, M.D.  . ESOPHAGOGASTRODUODENOSCOPY (EGD) WITH PROPOFOL N/A 11/21/2017   Procedure: ESOPHAGOGASTRODUODENOSCOPY (EGD) WITH PROPOFOL;  Surgeon: Gatha Mayer, MD;  Location: [facility];  Service: Endoscopy;  Laterality: N/A;  . PARTIAL HYSTERECTOMY       Social History   reports that she quit  smoking about 44 years ago. Her smoking use included cigarettes. She has a 18.00 pack-year smoking history. She has never used smokeless tobacco. She reports that she does not drink alcohol or use drugs.   Family History   Her family history includes Atrial fibrillation in her mother; Emphysema in her father; Hypertension in her mother; Stroke in her mother.   Allergies      Allergies  Allergen Reactions  . Tiazac [Diltiazem Hcl]     Ankle edema     Home Medications         Prior to Admission medications   Medication Sig Start Date End Date Taking? Authorizing Provider  albuterol (PROVENTIL HFA;VENTOLIN HFA) 108 (90 Base) MCG/ACT inhaler Inhale 2 puffs into the lungs as directed. 01/28/17   [provider]  amLODipine (NORVASC) 5 MG tablet Take 1 tablet (5 mg total) by mouth daily. 09/29/12   Nahser, Wonda Cheng, MD  benzonatate (TESSALON) 100 MG capsule Take 1 capsule (100 mg total) by mouth 2 (two) times daily as needed for cough. 11/23/17   Desiree Hane, MD  calcium carbonate (TUMS - DOSED IN MG ELEMENTAL CALCIUM) 500 MG chewable tablet Chew 1-2 tablets by mouth as needed for indigestion or heartburn.    [provider]  Calcium Carbonate-Vitamin D (CALCIUM 600 + D PO) Take 1 tablet by mouth daily.      [provider]  carvedilol (COREG) 25 MG tablet TAKE 1 TABLET BY MOUTH TWICE A DAY 07/11/18   Nahser, Wonda Cheng, MD  cholecalciferol (VITAMIN D) 1000 UNITS tablet Take 1,000 Units by mouth daily.  [provider]  doxazosin (CARDURA) 4 MG tablet Take 1 tablet (4 mg total) by mouth at bedtime. 02/03/18   Nahser, Wonda Cheng, MD  fenofibrate 160 MG tablet TAKE 1 TABLET BY MOUTH EVERY DAY 12/10/14   Nahser, Wonda Cheng, MD  fish oil-omega-3 fatty acids 1000 MG capsule Take 1 g by mouth daily.      [provider]  Flaxseed, Linseed, (FLAX SEED OIL PO) Take 1 capsule by mouth daily.      [provider]   hydrochlorothiazide (HYDRODIURIL) 25 MG tablet Take 25 mg by mouth daily.  01/10/16   [provider]  ibuprofen (ADVIL,MOTRIN) 600 MG tablet Take 600 mg by mouth every 6 (six) hours as needed. 05/14/17   [provider]  losartan (COZAAR) 100 MG tablet Take 1 tablet (100 mg total) by mouth daily. 01/11/18   Nahser, Wonda Cheng, MD  metFORMIN (GLUCOPHAGE) 1000 MG tablet Take 1,000 mg by mouth daily.     [provider]  pantoprazole (PROTONIX) 40 MG tablet Take 1 tablet (40 mg total) by mouth daily before breakfast. 11/24/17   Oretha Milch D, MD  Propylene Glycol (SYSTANE BALANCE) 0.6 % SOLN Apply 1 drop to eye as needed (dry eyes).    [provider]  simvastatin (ZOCOR) 20 MG tablet Take 1 tablet (20 mg total) by mouth at bedtime. 12/28/11   Nahser, Wonda Cheng, MD  vitamin A 8000 UNIT capsule Take 1 capsule by mouth daily. 08/27/15   [provider]  vitamin C (ASCORBIC ACID) 500 MG tablet Take 500 mg by mouth daily.      [provider]

## 2018-07-28 NOTE — Progress Notes (Signed)
[facility] Progress Note Patient Name: KYLI SORTER DOB: Oct 29, 1940 MRN: [id number]   Date of Service  07/28/2018  HPI/Events of Note  Serum Na 121--> 121 --> 122.  Pt has been off hypertonic saline since 07/27/2018, around 7pm.  eICU Interventions  Continue to monitor off hypertonic saline.      Intervention Category Minor Interventions: Electrolytes abnormality - evaluation and management  Elsie Lincoln 07/28/2018, 4:37 AM

## 2018-07-29 ENCOUNTER — Ambulatory Visit: Payer: Medicare Other | Admitting: Cardiovascular Disease

## 2018-07-29 DIAGNOSIS — B59 Pneumocystosis: Secondary | ICD-10-CM

## 2018-07-29 DIAGNOSIS — I10 Essential (primary) hypertension: Secondary | ICD-10-CM

## 2018-07-29 DIAGNOSIS — I330 Acute and subacute infective endocarditis: Secondary | ICD-10-CM

## 2018-07-29 LAB — CULTURE, RESPIRATORY W GRAM STAIN

## 2018-07-29 LAB — GLUCOSE, CAPILLARY
Glucose-Capillary: 207 mg/dL — ABNORMAL HIGH (ref 70–99)
Glucose-Capillary: 187 mg/dL — ABNORMAL HIGH (ref 70–99)
Glucose-Capillary: 254 mg/dL — ABNORMAL HIGH (ref 70–99)
Glucose-Capillary: 132 mg/dL — ABNORMAL HIGH (ref 70–99)

## 2018-07-29 LAB — CULTURE, RESPIRATORY

## 2018-07-29 MED ORDER — DOXYCYCLINE HYCLATE 100 MG PO TABS
100.0000 mg | ORAL_TABLET | Freq: Two times a day (BID) | ORAL | Status: DC
  Administered 2018-07-29 – 2018-08-02 (×9): 100 mg via ORAL
  Filled 2018-07-29 (×9): qty 1

## 2018-07-29 MED ORDER — FUROSEMIDE 10 MG/ML IJ SOLN
20.0000 mg | Freq: Two times a day (BID) | INTRAMUSCULAR | Status: AC
  Administered 2018-07-29 (×2): 20 mg via INTRAVENOUS
  Filled 2018-07-29 (×2): qty 2

## 2018-07-29 MED ORDER — INSULIN GLARGINE 100 UNIT/ML ~~LOC~~ SOLN
20.0000 [IU] | Freq: Every day | SUBCUTANEOUS | Status: DC
  Administered 2018-07-29 – 2018-08-02 (×5): 20 [IU] via SUBCUTANEOUS
  Filled 2018-07-29 (×5): qty 0.2

## 2018-07-29 NOTE — Consult Note (Signed)
Cardiology Consultation:   Patient ID: Carla Foster MRN: [id number]; DOB: 27-Jan-1941  Admit date: 07/25/2018 Date of Consult: 07/29/2018  Primary Care Provider: Christain Sacramento, MD Primary Cardiologist: Mertie Moores, MD  Primary Electrophysiologist:  None    Patient Profile:   Carla Foster is a 78 y.o. Foster with a hx of HTN, HLD, T2DM, asthma and h/o GIB, admitted for bilateral CAP, diastolic CHF and hyponatremia. She is being seen today for the evaluation of a large mobile density on the aortic valve, noted on transthoracic echocardiogram, at the request of Dr. Broadus John, Internal Medicine.   History of Present Illness:   Carla Foster is followed by Dr. Acie Fredrickson and has a h/o treated HTN, HLD and T2DM. No known CAD. Also h/o asthma and prior h/o GIB.   She presented to the [facility] on 07/25/18 with complaints of several day h/o worsening productive cough, dyspnea, diarrhea, vomiting and decreased PO intake. In the ED, CXR showed bibasilar infiltrates c/w PNA and well as bilateral pleural effusion, vascular congestion and pulmonary edema. Also significant hyponatremia w/ sodium level at 110. She was admitted for CAP and started on antibiotics. Also given IV Lasix for CHF. Sputum culture with MRSA but blood cultures are negative. Echo was obtained for CHF evaluation on 07/26/18 and showed normal LVEF at 60-65% and G2DD with high filling pressures. There was moderate to severe TR and RV systolic pressure was increased c/w severe pulmonary HTN. PA pressure was 80 mm Hg.  There was also a mobile density on the LVOT side of the aortic valve, measuring 0.715 x 2.35 cm. Unfortunately, there are no prior echos for comparison.   Cardiology was consulted primarily for AV mass.   She is afebrile. WBC showed downward trend yesterday, from 21>>16. She remains hyponatremic. Na 124 today. SCr 1.13. EKG on admit 1/6 showed NSR w/ low voltage precordial leads, but no significant change from prior. Her Net I/Os since admit  +1.1 L.   Pt is overall feeling better. Remains on supplemental O2. Denies CP.    Past Medical History:  Diagnosis Date  . Acute blood loss anemia   . AKI (acute kidney injury) ([place])   . Asthma   . Complication of anesthesia    nausea  . Diabetes mellitus    type 2  . GI bleed   . Hematemesis   . Hyperlipidemia   . Hypertension   . Melena     Past Surgical History:  Procedure Laterality Date  . CERVICAL DISCECTOMY  08/23/00   C5-6, C6-7 anterior cervical diskectomy with fibular bone bank fusion followed by Atlantis anterior cervical plating with the operating microscope  --  SURGEON:  Faythe Ghee, M.D.  . ESOPHAGOGASTRODUODENOSCOPY (EGD) WITH PROPOFOL N/A 11/21/2017   Procedure: ESOPHAGOGASTRODUODENOSCOPY (EGD) WITH PROPOFOL;  Surgeon: Gatha Mayer, MD;  Location: [place];  Service: Endoscopy;  Laterality: N/A;  . PARTIAL HYSTERECTOMY       Home Medications:  Prior to Admission medications   Medication Sig Start Date End Date Taking? Authorizing Provider  albuterol (PROVENTIL HFA;VENTOLIN HFA) 108 (90 Base) MCG/ACT inhaler Inhale 2 puffs into the lungs every 6 (six) hours as needed for wheezing or shortness of breath.  01/28/17  Yes [provider]  amLODipine (NORVASC) 5 MG tablet Take 1 tablet (5 mg total) by mouth daily. Patient taking differently: Take 10 mg by mouth daily.  09/29/12  Yes Nahser, Wonda Cheng, MD  calcium carbonate (TUMS - DOSED IN MG ELEMENTAL CALCIUM)  500 MG chewable tablet Chew 1-2 tablets by mouth daily as needed for indigestion or heartburn.    Yes [provider]  Calcium Carbonate-Vitamin D (CALCIUM 600 + D PO) Take 1 tablet by mouth daily.     Yes [provider]  carvedilol (COREG) 25 MG tablet TAKE 1 TABLET BY MOUTH TWICE A DAY Patient taking differently: Take 25 mg by mouth 2 (two) times daily with a meal.  07/11/18  Yes Nahser, Wonda Cheng, MD  cholecalciferol (VITAMIN D) 1000 UNITS tablet Take 1,000 Units by mouth  daily.     Yes [provider]  doxazosin (CARDURA) 4 MG tablet Take 1 tablet (4 mg total) by mouth at bedtime. 02/03/18  Yes Nahser, Wonda Cheng, MD  fenofibrate 160 MG tablet TAKE 1 TABLET BY MOUTH EVERY DAY 12/10/14  Yes Nahser, Wonda Cheng, MD  ferrous sulfate 325 (65 FE) MG tablet Take 325 mg by mouth every other day.  02/15/18  Yes [provider]  fish oil-omega-3 fatty acids 1000 MG capsule Take 1 g by mouth daily.     Yes [provider]  Flaxseed, Linseed, (FLAX SEED OIL PO) Take 1 capsule by mouth daily.     Yes [provider]  hydrochlorothiazide (HYDRODIURIL) 25 MG tablet Take 25 mg by mouth daily.  01/10/16  Yes [provider]  HYDROcodone-homatropine (HYCODAN) 5-1.5 MG/5ML syrup Take 5 mLs by mouth at bedtime. For ten days Started on 07-19-18 07/19/18 07/29/18 Yes [provider]  ibuprofen (ADVIL,MOTRIN) 600 MG tablet Take 600 mg by mouth every 6 (six) hours as needed for headache or mild pain.  05/14/17  Yes [provider]  losartan (COZAAR) 100 MG tablet Take 1 tablet (100 mg total) by mouth daily. 01/11/18  Yes Nahser, Wonda Cheng, MD  metFORMIN (GLUCOPHAGE) 1000 MG tablet Take 1,000 mg by mouth daily.    Yes [provider]  pantoprazole (PROTONIX) 40 MG tablet Take 1 tablet (40 mg total) by mouth daily before breakfast. 11/24/17  Yes Oretha Milch D, MD  Propylene Glycol (SYSTANE BALANCE) 0.6 % SOLN Apply 1 drop to eye as needed (dry eyes).   Yes [provider]  simvastatin (ZOCOR) 20 MG tablet Take 1 tablet (20 mg total) by mouth at bedtime. 12/28/11  Yes Nahser, Wonda Cheng, MD  vitamin A 8000 UNIT capsule Take 8,000 Units by mouth daily.  08/27/15  Yes [provider]  vitamin C (ASCORBIC ACID) 500 MG tablet Take 500 mg by mouth daily.     Yes [provider]  benzonatate (TESSALON) 100 MG capsule Take 1 capsule (100 mg total) by mouth 2 (two) times daily as needed for cough. Patient not taking:  Reported on 07/26/2018 11/23/17   Oretha Milch D, MD    Inpatient Medications: Scheduled Meds: . amLODipine  5 mg Oral Daily  . budesonide (PULMICORT) nebulizer solution  0.5 mg Nebulization BID  . carvedilol  25 mg Oral BID WC  . Chlorhexidine Gluconate Cloth  6 each Topical Q0600  . doxazosin  4 mg Oral QHS  . doxycycline  100 mg Oral Q12H  . feeding supplement (ENSURE ENLIVE)  237 mL Oral BID BM  . ferrous sulfate  325 mg Oral QODAY  . furosemide  20 mg Intravenous Q12H  . heparin  5,000 Units Subcutaneous Q8H  . insulin aspart  0-15 Units Subcutaneous TID WC  . insulin glargine  20 Units Subcutaneous Daily  . ipratropium-albuterol  3 mL Nebulization TID  .  mouth rinse  15 mL Mouth Rinse BID  . multivitamin with minerals  1 tablet Oral Daily  . mupirocin ointment  1 application Nasal BID  . pantoprazole  40 mg Oral QAC breakfast  . simvastatin  20 mg Oral QHS   Continuous Infusions:  PRN Meds: acetaminophen, albuterol, guaiFENesin  Allergies:    Allergies  Allergen Reactions  . Tiazac [Diltiazem Hcl]     Ankle edema    Social History:   Social History   Socioeconomic History  . Marital status: Married    Spouse name: Not on file  . Number of children: Not on file  . Years of education: Not on file  . Highest education level: Not on file  Occupational History  . Not on file  Social Needs  . Financial resource strain: Not on file  . Food insecurity:    Worry: Not on file    Inability: Not on file  . Transportation needs:    Medical: Not on file    Non-medical: Not on file  Tobacco Use  . Smoking status: Former Smoker    Packs/day: 1.00    Years: 18.00    Pack years: 18.00    Types: Cigarettes    Last attempt to quit: 01/13/1974    Years since quitting: 44.5  . Smokeless tobacco: Never Used  Substance and Sexual Activity  . Alcohol use: No  . Drug use: No  . Sexual activity: Not on file  Lifestyle  . Physical activity:    Days per week: Not on file     Minutes per session: Not on file  . Stress: Not on file  Relationships  . Social connections:    Talks on phone: Not on file    Gets together: Not on file    Attends religious service: Not on file    Active member of club or organization: Not on file    Attends meetings of clubs or organizations: Not on file    Relationship status: Not on file  . Intimate partner violence:    Fear of current or ex partner: Not on file    Emotionally abused: Not on file    Physically abused: Not on file    Forced sexual activity: Not on file  Other Topics Concern  . Not on file  Social History Narrative  . Not on file    Family History:    Family History  Problem Relation Age of Onset  . Emphysema Father   . Stroke Mother   . Hypertension Mother   . Atrial fibrillation Mother        heart flutter     ROS:  Please see the history of present illness.   All other ROS reviewed and negative.     Physical Exam/Data:   Vitals:   07/29/18 0447 07/29/18 0759 07/29/18 0807 07/29/18 1300  BP:   (!) 142/49   Pulse:  84 80 84  Resp:  18  18  Temp:   (!) 97.5 F (36.4 C)   TempSrc:   Oral   SpO2:  93% 93% 94%  Weight: 78.1 kg     Height:        Intake/Output Summary (Last 24 hours) at 07/29/2018 1431 Last data filed at 07/28/2018 2300 Gross per 24 hour  Intake 350 ml  Output 2 ml  Net 348 ml   Last 3 Weights 07/29/2018 07/28/2018 07/27/2018  Weight (lbs) 172 lb 2.9 oz 167 lb 1.7 oz 167 lb  1.7 oz  Weight (kg) 78.1 kg 75.8 kg 75.8 kg     Body mass index is 27.79 kg/m.  General:  Elderly WF, Well nourished, well developed, in no acute distress HEENT: normal Lymph: no adenopathy Neck: no JVD Endocrine:  No thryomegaly Vascular: No carotid bruits; FA pulses 2+ bilaterally without bruits  Cardiac:  RRR faint murmur heard at RUSB and LSB Lungs:  Decreased BS bilaterally  with faint expiratory wheezing  Abd: soft, nontender, no hepatomegaly  Ext: no edema Musculoskeletal:  No  deformities, BUE and BLE strength normal and equal Skin: warm and dry  Neuro:  CNs 2-12 intact, no focal abnormalities noted Psych:  Normal affect   EKG:  The EKG was personally reviewed and demonstrates:  SR w/ low voltage QRS in precordial leads Telemetry:  Telemetry was personally reviewed and demonstrates:  N/A  Relevant CV Studies: 2D Echo 07/26/18 Study Conclusions  - Left ventricle: The cavity size was normal. Systolic function was   normal. The estimated ejection fraction was in the range of 60%   to 65%. Wall motion was normal; there were no regional wall   motion abnormalities. Features are consistent with a pseudonormal   left ventricular filling pattern, with concomitant abnormal   relaxation and increased filling pressure (grade 2 diastolic   dysfunction). Doppler parameters are consistent with high   ventricular filling pressure. - Aortic valve: There is a mobile density on the LVOT side of the   aortic valve measuring 0.715 x 2.35cm of unknown etiology.   Recommend TEE for further evaulation. Valve area (VTI): 1.9 cm^2.   Valve area (Vmax): 1.82 cm^2. Valve area (Vmean): 1.72 cm^2. - Mitral valve: Severely calcified annulus. There was mild   regurgitation. Valve area by continuity equation (using LVOT   flow): 1.44 cm^2. - Left atrium: The atrium was moderately to severely dilated. - Tricuspid valve: There was moderate-severe regurgitation. - Pulmonary arteries: PA peak pressure: 80 mm Hg (S).  Impressions:  - The right ventricular systolic pressure was increased consistent   with severe pulmonary hypertension.  Laboratory Data:  Chemistry Recent Labs  Lab 07/27/18 0220  07/28/18 0533 07/28/18 0930 07/28/18 2047  NA 114*   < > 122* 124* 124*  K 5.0  --   --  4.8 5.1  CL 84*  --   --  93* 92*  CO2 21*  --   --  24 25  GLUCOSE 145*  --   --  233* 239*  BUN 26*  --   --  26* 31*  CREATININE 1.12*  --   --  1.03* 1.13*  CALCIUM 8.2*  --   --  8.3* 8.3*   GFRNONAA 47*  --   --  52* 46*  GFRAA 54*  --   --  60* 54*  ANIONGAP 9  --   --  7 7   < > = values in this interval not displayed.    Recent Labs  Lab 07/25/18 2056  PROT 6.1*  ALBUMIN 2.8*  AST 39  ALT 25  ALKPHOS 34*  BILITOT 1.3*   Hematology Recent Labs  Lab 07/26/18 0145 07/27/18 0220 07/28/18 0930  WBC 15.1* 21.4* 16.5*  RBC 2.83* 2.82* 3.07*  HGB 7.8* 7.4* 8.4*  HCT 22.4* 21.9* 24.9*  MCV 79.2* 77.7* 81.1  MCH 27.6 26.2 27.4  MCHC 34.8 33.8 33.7  RDW 12.3 12.5 13.6  PLT 338 337 425*   Cardiac EnzymesNo results for input(s): TROPONINI in the  last 168 hours.  Recent Labs  Lab 07/25/18 2103  TROPIPOC 0.01    BNP Recent Labs  Lab 07/25/18 2056  BNP 682.3*    DDimer No results for input(s): DDIMER in the last 168 hours.  Radiology/Studies:  Dg Chest Port 1 View  Result Date: 07/28/2018 CLINICAL DATA:  Pneumonia. EXAM: PORTABLE CHEST 1 VIEW COMPARISON:  07/27/2018. FINDINGS: Cardiomegaly. Persistent prominent right upper lobe and bibasilar infiltrates/edema and atelectasis noted. Interim slight improvement. Small bilateral pleural effusions. No pneumothorax stable cardiomegaly. No acute bony abnormality. Prior cervicothoracic fusion. IMPRESSION: 1. Persistent prominent right upper lobe and bibasilar infiltrates/edema and atelectasis again noted. Slight improvement from prior exam. Small bilateral pleural effusions. 2.  Stable cardiomegaly. Electronically Signed   By: Marcello Moores  Register   On: 07/28/2018 05:58   Dg Chest Port 1 View  Result Date: 07/27/2018 CLINICAL DATA:  Followup lung disease/pneumonia EXAM: PORTABLE CHEST 1 VIEW COMPARISON:  07/25/2018 FINDINGS: Artifact overlies the chest. Bilateral pulmonary infiltrates have worsened, particularly on the right, with more extensive involvement of the right lung. Small amount of pleural fluid on each side. No qualitatively new finding. IMPRESSION: Worsening of bilateral pneumonia, particularly on the right.  Electronically Signed   By: Nelson Chimes M.D.   On: 07/27/2018 06:37   Dg Chest Port 1 View  Result Date: 07/25/2018 CLINICAL DATA:  Wheezing EXAM: PORTABLE CHEST 1 VIEW COMPARISON:  12/19/2014 FINDINGS: Small bilateral pleural effusions with basilar airspace disease. Cardiomegaly with vascular congestion and underlying pulmonary edema. Aortic atherosclerosis. No pneumothorax. Postsurgical changes in the lower cervical spine. IMPRESSION: 1. Small bilateral pleural effusions with cardiomegaly, vascular congestion and pulmonary edema 2. More confluent airspace disease at both bases, may reflect superimposed atelectasis or pneumonia Electronically Signed   By: Donavan Foil M.D.   On: 07/25/2018 21:09    Assessment and Plan:   AMZIE SILLAS is a 78 y.o. Foster with a hx of HTN, HLD, T2DM, asthma and h/o GIB, admitted for bilateral CAP, diastolic CHF and hyponatremia. She is being seen today for the evaluation of a large mobile density on the aortic valve, noted on transthoracic echocardiogram, at the request of Dr. Broadus John, Internal Medicine.  1. Mobile Density on Aortic Valve: echo obtained 07/26/18 showed  a mobile density on the LVOT side of the aortic valve measuring 0.715 x 2.35cm of unknown etiology. She is currently being treated for bilateral PNA, however blood cultures are negative. Currently afebrile with downward trending WBC ct. However, needs further investigation. Recommend TEE to better assess. Will try to obtain on Monday.   2. Bilateral CAP: on antibiotics. Sputum culture + for MRSA . Further management per IM.   3. Acute on Chronic Diastolic CHF: treated w/ IV diuretics. Pt notes significant improvement/ near resolution of LEE.    4. Pulmonary HTN: echo showed moderate to severe TR and RV systolic pressure was increased c/w severe pulmonary HTN. PA pressure was 80 mm Hg. Echo was done 3 days ago. She has since been treated w/ diuretics.  Recommend continuation. May consider repeat  outpatient echo in the near future to reassess.    5. Tricuspid Regurgitation: moderate to severe by echo. Will assess by TEE.   6. Hyponatremia: Na 124 today. IM has been following. They suspect multifactorial, secondary to volume overloaded state, HCTZ on admission. In addition could have had a component of SIADH from pneumonia.  7. HTN: controlled no current regimen. Monitor.   8. DM: per primary.   9. HLD: on statin.  For questions or updates, please contact [place] Please consult [email or web address] for contact info under     Signed, Lyda Jester, PA-C  07/29/2018 2:31 PM

## 2018-07-29 NOTE — Progress Notes (Signed)
SATURATION QUALIFICATIONS: (This note is used to comply with regulatory documentation for home oxygen)  Patient Saturations on Room Air at Rest = 88%  Patient Saturations on Room Air while Ambulating = 85%  Patient Saturations on 2 Liters of oxygen while Ambulating = 91%  Please briefly explain why patient needs home oxygen: Pt requires supplemental oxygen to maintain SpO2 >/88% with ambulation.  Mabeline Caras, PT, DPT Acute Rehabilitation Services  Pager [phone] Office [phone]

## 2018-07-29 NOTE — Evaluation (Signed)
Physical Therapy Evaluation Patient Details Name: Carla Foster MRN: [id number] DOB: June 30, 1941 Today's Date: 07/29/2018   History of Present Illness  Pt is a 78 y.o. female admitted 07/25/18 with acute hypoxic respiratory failure secondary to PNA and CHF exacerbation; also with hyponatremia. Transfer out of ICU on 1/10. Pt with large mobile density on aortic valve; awaiting TEE. PMH includes HTN, CKD III, CHF, DM, asthma.    Clinical Impression  Pt presents with an overall decrease in functional mobility secondary to above. PTA, pt indep and lives alone; children nearby and available to provide 24/7 support at discharge. Today, pt ambulatory with RW and min guard for balance; required modA to stand from lower toilet height. Daughter present and supportive. Pt would benefit from continued acute PT services to maximize functional mobility and independence prior to d/c with HHPT services.     Follow Up Recommendations Home health PT;Supervision/Assistance - 24 hour    Equipment Recommendations  None recommended by PT    Recommendations for Other Services       Precautions / Restrictions Precautions Precautions: Fall Precaution Comments: Currently requiring 2L O2 [place] to maintain >90% Restrictions Weight Bearing Restrictions: No      Mobility  Bed Mobility               General bed mobility comments: Received sitting in recliner  Transfers Overall transfer level: Needs assistance Equipment used: Rolling walker (2 wheeled) Transfers: Sit to/from Stand Sit to Stand: Min guard;Mod assist         General transfer comment: Min guard to stand from recliner with arm rests support. Required modA to stand from lower toilet height, heavy reliance on hand rail to pull and other UE HHA.   Ambulation/Gait Ambulation/Gait assistance: Min guard Gait Distance (Feet): 40 Feet Assistive device: Rolling walker (2 wheeled) Gait Pattern/deviations: Step-through pattern;Decreased stride  length;Trunk flexed Gait velocity: Decreased Gait velocity interpretation: 1.31 - 2.62 ft/sec, indicative of limited community ambulator General Gait Details: Slow, steady gait with RW and min guard for balance; some self-corrected knee instability with fatigue. While standing at sink, pt starting to walk away without RW, requiring cues for safety  Stairs            Wheelchair Mobility    Modified Rankin (Stroke Patients Only)       Balance Overall balance assessment: Needs assistance   Sitting balance-Leahy Scale: Fair       Standing balance-Leahy Scale: Poor Standing balance comment: Reliant on at least single UE support for standing balance                             Pertinent Vitals/Pain Pain Assessment: No/denies pain    Home Living Family/patient expects to be discharged to:: Private residence Living Arrangements: Alone Available Help at Discharge: Family;Available 24 hours/day Type of Home: House Home Access: Stairs to enter   [employer] of Steps: 1 Home Layout: One level Home Equipment: Walker - 2 wheels;Cane - single point;Shower seat;Bedside commode Additional Comments: Daughters live next door and nearby; can be available for as much support as needed    Prior Function Level of Independence: Independent         Comments: Indep without device; drives.      Hand Dominance        Extremity/Trunk Assessment   Upper Extremity Assessment Upper Extremity Assessment: Generalized weakness    Lower Extremity Assessment Lower Extremity Assessment: Generalized weakness  Communication   Communication: HOH  Cognition Arousal/Alertness: Awake/alert Behavior During Therapy: WFL for tasks assessed/performed Overall Cognitive Status: Impaired/Different from baseline Area of Impairment: Problem solving;Safety/judgement                         Safety/Judgement: Decreased awareness of safety   Problem  Solving: Slow processing;Requires verbal cues General Comments: HOH      General Comments General comments (skin integrity, edema, etc.): SpO2 down to 85% on RA, returning to >90% on 2L O2 [place]    Exercises     Assessment/Plan    PT Assessment Patient needs continued PT services  PT Problem List Decreased strength;Decreased activity tolerance;Decreased balance;Decreased mobility;Decreased knowledge of use of DME;Cardiopulmonary status limiting activity       PT Treatment Interventions DME instruction;Gait training;Stair training;Functional mobility training;Therapeutic activities;Therapeutic exercise;Balance training;Patient/family education    PT Goals (Current goals can be found in the Care Plan section)  Acute Rehab PT Goals Patient Stated Goal: Get back home, hopefully without having to wear oxygen PT Goal Formulation: With patient/family Time For Goal Achievement: 08/12/18 Potential to Achieve Goals: Good    Frequency Min 3X/week   Barriers to discharge        Co-evaluation               AM-PAC PT "6 Clicks" Mobility  Outcome Measure Help needed turning from your back to your side while in a flat bed without using bedrails?: A Little Help needed moving from lying on your back to sitting on the side of a flat bed without using bedrails?: A Little Help needed moving to and from a bed to a chair (including a wheelchair)?: A Lot Help needed standing up from a chair using your arms (e.g., wheelchair or bedside chair)?: A Little Help needed to walk in hospital room?: A Little Help needed climbing 3-5 steps with a railing? : A Lot 6 Click Score: 16    End of Session Equipment Utilized During Treatment: Gait belt;Oxygen Activity Tolerance: Patient tolerated treatment well Patient left: in chair;with call bell/phone within reach;with chair alarm set;with family/visitor present;with nursing/sitter in room Nurse Communication: Mobility status PT Visit Diagnosis: Other  abnormalities of gait and mobility (R26.89);Muscle weakness (generalized) (M62.81)    Time: 5409-8119 PT Time Calculation (min) (ACUTE ONLY): 29 min   Charges:   PT Evaluation $PT Eval Moderate Complexity: 1 Mod PT Treatments $Gait Training: 8-22 mins      Mabeline Caras, PT, DPT Acute Rehabilitation Services  Pager [phone] Office [place] 07/29/2018, 1:14 PM

## 2018-07-29 NOTE — Progress Notes (Addendum)
PROGRESS NOTE    Carla Foster  TTS:[id number] DOB: 08-02-1940 DOA: 07/25/2018 PCP: Christain Sacramento, MD  Brief Narrative: 78 year old female with history of asthma, hypertension, diabetes mellitus, peptic ulcer disease, was admitted to the ICU on 1/16 with acute hypoxic respiratory failure secondary to pneumonia and CHF exacerbation, she was also noted to have a sodium of 110. -Improved with diuresis and antibiotics -As part of her work-up, echocardiogram revealed normal EF, grade 2 diastolic dysfunction and large mobile density on the aortic valve measuring 0.7 x 2.5 cm. -She was transferred from ICU/PCCM to [facility] service today 1/10  Assessment & Plan:   Acute hypoxic respiratory failure -Due to community-acquired pneumonia and CHF exacerbation -Improving with diuresis and antibiotics -Continue IV Lasix today -sputum culture with MRSA, blood cultures are negative -Transition to doxycycline -Wean O2  Acute on chronic diastolic CHF/severe pulmonary hypertension -Echo with preserved EF and grade 2 diastolic dysfunction -Continue IV Lasix today -Improving with diuresis -Needs further work-up for pulmonary hypertension as well  Large mobile density on aortic valve -No history suggestive of endocarditis, blood cultures are negative -Cardiology consulted, will need TEE as well  Hyponatremia -Suspect this is multifactorial, secondary to volume overloaded state, HCTZ on admission -In addition could have had a component of SIADH from pneumonia -Improving with above management  Acute on chronic microcytic anemia -History of peptic ulcer disease, no overt blood loss -Continue PPI, monitor hemoglobin  Stage III chronic kidney disease -Stable, monitor -Losartan, HCTZ on hold  Diabetes mellitus -CBG is uncontrolled, check hemoglobin A1c, add Lantus, continue sliding scale  DVT prophylaxis: Heparin subcutaneous Code Status: Full code Family Communication: No family at  bedside Disposition Plan: Home pending above work-up  Consultants:   PCCM  Cardiology   Procedures:   Antimicrobials:    Subjective: -Breathing better, wants to go home  Objective: Vitals:   07/28/18 2321 07/29/18 0447 07/29/18 0759 07/29/18 0807  BP: (!) 130/47   (!) 142/49  Pulse: 81  84 80  Resp: 18  18   Temp: (!) 97.5 F (36.4 C)   (!) 97.5 F (36.4 C)  TempSrc: Oral   Oral  SpO2: 93%  93% 93%  Weight:  78.1 kg    Height:        Intake/Output Summary (Last 24 hours) at 07/29/2018 1005 Last data filed at 07/28/2018 2300 Gross per 24 hour  Intake 650 ml  Output 602 ml  Net 48 ml   Filed Weights   07/27/18 0600 07/28/18 0600 07/29/18 0447  Weight: 75.8 kg 75.8 kg 78.1 kg    Examination:  General exam: Frail, chronically ill female appears calm and comfortable , no distress Respiratory system: Decreased breath sounds at both bases Cardiovascular system: S1 & S2 heard, RRR. Gastrointestinal system: Abdomen is nondistended, soft and nontender.Normal bowel sounds heard. Central nervous system: Alert and oriented. No focal neurological deficits. Extremities: Trace edema Skin: No rashes, lesions or ulcers Psychiatry: Judgement and insight appear normal. Mood & affect appropriate.     Data Reviewed:   CBC: Recent Labs  Lab 07/25/18 2056 07/26/18 0014 07/26/18 0145 07/27/18 0220 07/28/18 0930  WBC 14.1*  --  15.1* 21.4* 16.5*  NEUTROABS 12.6*  --   --   --   --   HGB 7.4* 8.2* 7.8* 7.4* 8.4*  HCT 21.9* 24.0* 22.4* 21.9* 24.9*  MCV 78.2*  --  79.2* 77.7* 81.1  PLT 297  --  338 337 092*   Basic Metabolic Panel: Recent Labs  Lab 07/25/18 2056  07/26/18 0014 07/26/18 0145  07/27/18 0220  07/27/18 2045 07/28/18 0113 07/28/18 0533 07/28/18 0930 07/28/18 2047  NA 110*   < > 108* 110*   < > 114*   < > 121* 122* 122* 124* 124*  K 5.1  --  4.9 4.8  --  5.0  --   --   --   --  4.8 5.1  CL 78*  --  77* 76*  --  84*  --   --   --   --  93* 92*  CO2  20*  --   --  22  --  21*  --   --   --   --  24 25  GLUCOSE 323*  --  297* 281*  --  145*  --   --   --   --  233* 239*  BUN 21  --  24* 23  --  26*  --   --   --   --  26* 31*  CREATININE 1.11*  --  1.10* 1.13*  --  1.12*  --   --   --   --  1.03* 1.13*  CALCIUM 8.3*  --   --  8.7*  --  8.2*  --   --   --   --  8.3* 8.3*  MG  --   --   --  1.5*  --   --   --   --   --   --   --   --   PHOS  --   --   --  3.2  --   --   --   --   --   --   --   --    < > = values in this interval not displayed.   GFR: Estimated Creatinine Clearance: 42.6 mL/min (A) (by C-G formula based on SCr of 1.13 mg/dL (H)). Liver Function Tests: Recent Labs  Lab 07/25/18 2056  AST 39  ALT 25  ALKPHOS 34*  BILITOT 1.3*  PROT 6.1*  ALBUMIN 2.8*   No results for input(s): LIPASE, AMYLASE in the last 168 hours. No results for input(s): AMMONIA in the last 168 hours. Coagulation Profile: Recent Labs  Lab 07/26/18 0010  INR 1.18   Cardiac Enzymes: No results for input(s): CKTOTAL, CKMB, CKMBINDEX, TROPONINI in the last 168 hours. BNP (last 3 results) No results for input(s): PROBNP in the last 8760 hours. HbA1C: No results for input(s): HGBA1C in the last 72 hours. CBG: Recent Labs  Lab 07/27/18 1550 07/28/18 0748 07/28/18 1127 07/28/18 1545 07/29/18 0810  GLUCAP 147* 141* 236* 218* 207*   Lipid Profile: No results for input(s): CHOL, HDL, LDLCALC, TRIG, CHOLHDL, LDLDIRECT in the last 72 hours. Thyroid Function Tests: No results for input(s): TSH, T4TOTAL, FREET4, T3FREE, THYROIDAB in the last 72 hours. Anemia Panel: No results for input(s): VITAMINB12, FOLATE, FERRITIN, TIBC, IRON, RETICCTPCT in the last 72 hours. Urine analysis:    Component Value Date/Time   COLORURINE YELLOW 07/26/2018 0633   APPEARANCEUR HAZY (A) 07/26/2018 0633   LABSPEC 1.017 07/26/2018 0633   PHURINE 5.0 07/26/2018 0633   GLUCOSEU 150 (A) 07/26/2018 0633   HGBUR NEGATIVE 07/26/2018 0633   BILIRUBINUR NEGATIVE  07/26/2018 0633   KETONESUR 5 (A) 07/26/2018 0633   PROTEINUR 100 (A) 07/26/2018 0633   NITRITE NEGATIVE 07/26/2018 0633   LEUKOCYTESUR NEGATIVE 07/26/2018 0633   Sepsis Labs: @LABRCNTIP (procalcitonin:4,lacticidven:4)  ) Recent Results (  from the past 240 hour(s))  Culture, blood (routine x 2)     Status: None (Preliminary result)   Collection Time: 07/26/18  1:45 AM  Result Value Ref Range Status   Specimen Description BLOOD RIGHT ANTECUBITAL  Final   Special Requests   Final    BOTTLES DRAWN AEROBIC AND ANAEROBIC Blood Culture adequate volume   Culture   Final    NO GROWTH 3 DAYS Performed at [facility], [address], [place], [place] [address]    Report Status PENDING  Incomplete  MRSA PCR Screening     Status: Abnormal   Collection Time: 07/26/18  2:23 AM  Result Value Ref Range Status   MRSA by PCR POSITIVE (A) NEGATIVE Final    Comment:        The GeneXpert MRSA Assay (FDA approved for NASAL specimens only), is one component of a comprehensive MRSA colonization surveillance program. It is not intended to diagnose MRSA infection nor to guide or monitor treatment for MRSA infections. RESULT CALLED TO, READ BACK BY AND VERIFIED WITH: Arville Go RN [id number] 07/26/18 A BROWNING Performed at [facility], [place] [address] [place], [place] [address]   Culture, blood (routine x 2)     Status: None (Preliminary result)   Collection Time: 07/26/18  3:04 AM  Result Value Ref Range Status   Specimen Description BLOOD RIGHT HAND  Final   Special Requests   Final    BOTTLES DRAWN AEROBIC ONLY Blood Culture results may not be optimal due to an excessive volume of blood received in culture bottles   Culture   Final    NO GROWTH 3 DAYS Performed at [facility], [place] [address], [place], [place] [address]    Report Status PENDING  Incomplete  Urine culture     Status: None   Collection Time: 07/26/18  7:12 AM  Result Value Ref Range Status   Specimen Description  URINE, RANDOM  Final   Special Requests NONE  Final   Culture   Final    NO GROWTH Performed at [facility], [address], [place], [place] [address]    Report Status 07/27/2018 FINAL  Final  Respiratory Panel by PCR     Status: None   Collection Time: 07/26/18  7:13 AM  Result Value Ref Range Status   Adenovirus NOT DETECTED NOT DETECTED Final   Coronavirus 229E NOT DETECTED NOT DETECTED Final   Coronavirus HKU1 NOT DETECTED NOT DETECTED Final   Coronavirus NL63 NOT DETECTED NOT DETECTED Final   Coronavirus OC43 NOT DETECTED NOT DETECTED Final   Metapneumovirus NOT DETECTED NOT DETECTED Final   Rhinovirus / Enterovirus NOT DETECTED NOT DETECTED Final   Influenza A NOT DETECTED NOT DETECTED Final   Influenza B NOT DETECTED NOT DETECTED Final   Parainfluenza Virus 1 NOT DETECTED NOT DETECTED Final   Parainfluenza Virus 2 NOT DETECTED NOT DETECTED Final   Parainfluenza Virus 3 NOT DETECTED NOT DETECTED Final   Parainfluenza Virus 4 NOT DETECTED NOT DETECTED Final   Respiratory Syncytial Virus NOT DETECTED NOT DETECTED Final   Bordetella pertussis NOT DETECTED NOT DETECTED Final   Chlamydophila pneumoniae NOT DETECTED NOT DETECTED Final   Mycoplasma pneumoniae NOT DETECTED NOT DETECTED Final    Comment: Performed at [facility], [place] [address], [place], [place] [address]  Expectorated sputum assessment w rflx to resp cult     Status: None   Collection Time: 07/26/18  5:50 PM  Result Value Ref Range Status   Specimen Description EXPECTORATED SPUTUM  Final   Special Requests NONE  Final   Sputum evaluation   Final    THIS SPECIMEN IS ACCEPTABLE FOR SPUTUM CULTURE Performed at [facility], [address], [place], [place] [address]    Report Status 07/26/2018 FINAL  Final  Culture, respiratory     Status: None   Collection Time: 07/26/18  5:50 PM  Result Value Ref Range Status   Specimen Description EXPECTORATED SPUTUM  Final   Special Requests NONE Reflexed  from [id number]  Final   Gram Stain   Final    ABUNDANT WBC PRESENT, PREDOMINANTLY PMN ABUNDANT GRAM POSITIVE COCCI Performed at [facility], [place] [address], [place], [place] [address]    Culture   Final    ABUNDANT METHICILLIN RESISTANT STAPHYLOCOCCUS AUREUS   Report Status 07/29/2018 FINAL  Final   Organism ID, Bacteria METHICILLIN RESISTANT STAPHYLOCOCCUS AUREUS  Final      Susceptibility   Methicillin resistant staphylococcus aureus - MIC*    CIPROFLOXACIN >=8 RESISTANT Resistant     ERYTHROMYCIN >=8 RESISTANT Resistant     GENTAMICIN <=0.5 SENSITIVE Sensitive     OXACILLIN >=4 RESISTANT Resistant     TETRACYCLINE <=1 SENSITIVE Sensitive     VANCOMYCIN <=0.5 SENSITIVE Sensitive     TRIMETH/SULFA <=10 SENSITIVE Sensitive     CLINDAMYCIN <=0.25 SENSITIVE Sensitive     RIFAMPIN <=0.5 SENSITIVE Sensitive     Inducible Clindamycin NEGATIVE Sensitive     * ABUNDANT METHICILLIN RESISTANT STAPHYLOCOCCUS AUREUS         Radiology Studies: Dg Chest Port 1 View  Result Date: 07/28/2018 CLINICAL DATA:  Pneumonia. EXAM: PORTABLE CHEST 1 VIEW COMPARISON:  07/27/2018. FINDINGS: Cardiomegaly. Persistent prominent right upper lobe and bibasilar infiltrates/edema and atelectasis noted. Interim slight improvement. Small bilateral pleural effusions. No pneumothorax stable cardiomegaly. No acute bony abnormality. Prior cervicothoracic fusion. IMPRESSION: 1. Persistent prominent right upper lobe and bibasilar infiltrates/edema and atelectasis again noted. Slight improvement from prior exam. Small bilateral pleural effusions. 2.  Stable cardiomegaly. Electronically Signed   By: Marcello Moores  Register   On: 07/28/2018 05:58        Scheduled Meds: . amLODipine  5 mg Oral Daily  . budesonide (PULMICORT) nebulizer solution  0.5 mg Nebulization BID  . carvedilol  25 mg Oral BID WC  . Chlorhexidine Gluconate Cloth  6 each Topical Q0600  . doxazosin  4 mg Oral QHS  . feeding supplement (ENSURE ENLIVE)   237 mL Oral BID BM  . ferrous sulfate  325 mg Oral QODAY  . furosemide  20 mg Intravenous Q12H  . heparin  5,000 Units Subcutaneous Q8H  . insulin aspart  0-15 Units Subcutaneous TID WC  . ipratropium-albuterol  3 mL Nebulization TID  . mouth rinse  15 mL Mouth Rinse BID  . multivitamin with minerals  1 tablet Oral Daily  . mupirocin ointment  1 application Nasal BID  . pantoprazole  40 mg Oral QAC breakfast  . simvastatin  20 mg Oral QHS   Continuous Infusions: . azithromycin 500 mg (07/29/18 0405)  . cefTRIAXone (ROCEPHIN)  IV Stopped (07/28/18 1434)     LOS: 3 days    Time spent: 14min    Domenic Polite, MD [facility] Page via [email or web address], password [email or web address] After 7PM please contact night-coverage  07/29/2018, 10:05 AM

## 2018-07-29 NOTE — Care Management Important Message (Signed)
Important Message  Patient Details  Name: Carla Foster MRN: [id number] Date of Birth: 1941/04/19   Medicare Important Message Given:  Yes    Zenon Mayo, RN 07/29/2018, 4:04 PM

## 2018-07-29 NOTE — Care Management Note (Addendum)
Case Management Note  Patient Details  Name: Carla Foster MRN: [id number] Date of Birth: 26-Aug-1940  Subjective/Objective:  From home alone, Per pt eval rec HHPT, patient has 24 hr support from family.  NCM left Medicare .gove list for her to look over to choose [facility] agency.  NCM will check back with patient.   NCM checked back with patient she states she would like [facility] for HHPT, [place] , she may need home oxygen also, Jermaine with [facility] notified of this information. Soc will begin 24-48 hrs post dc.   Will need order for oxygen and HHPT/HHOT.              Action/Plan: Home with HHPT with [facility] Jermaine aware, she may also need home oxygen, Jermaine aware, but notify him if patient goes home on Saturday.    Expected Discharge Date:  07/29/18               Expected Discharge Plan:  [place]  In-House Referral:     Discharge planning Services  CM Consult  Post Acute Care Choice:  Home Health Choice offered to:     DME Arranged:    DME Agency:     HH Arranged:   HHPT,HHOT,HHRN HH Agency:   [place]  Status of Service:  In process, will continue to follow  If discussed at Long Length of Stay Meetings, dates discussed:    Additional Comments:  Zenon Mayo, RN 07/29/2018, 4:14 PM

## 2018-07-30 DIAGNOSIS — I358 Other nonrheumatic aortic valve disorders: Secondary | ICD-10-CM

## 2018-07-30 LAB — BASIC METABOLIC PANEL
Anion gap: 8 (ref 5–15)
BUN: 33 mg/dL — ABNORMAL HIGH (ref 8–23)
CO2: 27 mmol/L (ref 22–32)
Calcium: 8.7 mg/dL — ABNORMAL LOW (ref 8.9–10.3)
Chloride: 90 mmol/L — ABNORMAL LOW (ref 98–111)
Creatinine, Ser: 0.96 mg/dL (ref 0.44–1.00)
GFR calc Af Amer: >60 mL/min (ref >60)
GFR calc non Af Amer: 56 mL/min — ABNORMAL LOW (ref >60)
Glucose, Bld: 172 mg/dL — ABNORMAL HIGH (ref 70–99)
Potassium: 5.1 mmol/L (ref 3.5–5.1)
Sodium: 125 mmol/L — ABNORMAL LOW (ref 135–145)

## 2018-07-30 LAB — GLUCOSE, CAPILLARY
Glucose-Capillary: 159 mg/dL — ABNORMAL HIGH (ref 70–99)
Glucose-Capillary: 183 mg/dL — ABNORMAL HIGH (ref 70–99)
Glucose-Capillary: 93 mg/dL (ref 70–99)
Glucose-Capillary: 230 mg/dL — ABNORMAL HIGH (ref 70–99)

## 2018-07-30 LAB — CBC
HCT: 22.7 % — ABNORMAL LOW (ref 36.0–46.0)
Hemoglobin: 7.6 g/dL — ABNORMAL LOW (ref 12.0–15.0)
MCH: 27 pg (ref 26.0–34.0)
MCHC: 33.5 g/dL (ref 30.0–36.0)
MCV: 80.5 fL (ref 80.0–100.0)
NRBC: 0 % (ref 0.0–0.2)
Platelets: 392 10*3/uL (ref 150–400)
RBC: 2.82 MIL/uL — ABNORMAL LOW (ref 3.87–5.11)
RDW: 13.6 % (ref 11.5–15.5)
WBC: 20 10*3/uL — ABNORMAL HIGH (ref 4.0–10.5)

## 2018-07-30 MED ORDER — FUROSEMIDE 10 MG/ML IJ SOLN
20.0000 mg | Freq: Two times a day (BID) | INTRAMUSCULAR | Status: AC
  Administered 2018-07-30 (×2): 20 mg via INTRAVENOUS
  Filled 2018-07-30 (×2): qty 2

## 2018-07-30 NOTE — Evaluation (Signed)
Occupational Therapy Evaluation Patient Details Name: Carla Foster MRN: [id number] DOB: [birth year] Today's Date: 07/30/2018    History of Present Illness Pt is a 78 y.o. female admitted 07/25/18 with acute hypoxic respiratory failure secondary to PNA and CHF exacerbation; also with hyponatremia. Transfer out of ICU on 1/10. Pt with large mobile density on aortic valve; awaiting TEE. PMH includes HTN, CKD III, CHF, DM, asthma.   Clinical Impression   PTA independent and driving.  Admitted for above and limited by decreased activity tolerance, generalized weakness, and impaired balance, as well as decreased awareness of safety and problem solving. Patient currently completes basic transfers with min guard assist, LB ADLs with min assist, UB ADLs with setup assist, and grooming standing with min guard assist. On 2L supplemental oxygen via [place] throughout session, cueing for pursed lip breathing.  Patient will benefit from continued OT services while admitted and after dc at [facility] level in order to optimize independence and safety with ADLs/mobility.    Follow Up Recommendations  Home health OT;Supervision/Assistance - 24 hour    Equipment Recommendations  3 in 1 bedside commode    Recommendations for Other Services       Precautions / Restrictions Precautions Precautions: Fall Precaution Comments: Currently requiring 2L O2 [place] to maintain >90% Restrictions Weight Bearing Restrictions: No      Mobility Bed Mobility Overal bed mobility: Modified Independent             General bed mobility comments: no assist required. increased time and effort  Transfers Overall transfer level: Needs assistance Equipment used: Rolling walker (2 wheeled) Transfers: Sit to/from Stand Sit to Stand: Min guard         General transfer comment: min guard for safety. Cues for hand placement.     Balance Overall balance assessment: Needs assistance Sitting-balance support: No upper extremity  supported;Feet supported Sitting balance-Leahy Scale: Fair     Standing balance support: Bilateral upper extremity supported;During functional activity Standing balance-Leahy Scale: Poor Standing balance comment: able to stand at sink to complete grooming without UE support                           ADL either performed or assessed with clinical judgement   ADL Overall ADL's : Needs assistance/impaired     Grooming: Min guard;Standing   Upper Body Bathing: Set up;Sitting   Lower Body Bathing: Minimal assistance;Sit to/from stand   Upper Body Dressing : Set up;Sitting   Lower Body Dressing: Minimal assistance;Sit to/from stand   Toilet Transfer: Min guard;Ambulation;RW Toilet Transfer Details (indicate cue type and reason): simulated in room          Functional mobility during ADLs: Min guard;Rolling walker General ADL Comments: limited by strength, endurance and activity tolerance     Vision   Vision Assessment?: No apparent visual deficits     Perception     Praxis      Pertinent Vitals/Pain Pain Assessment: No/denies pain     Hand Dominance     Extremity/Trunk Assessment Upper Extremity Assessment Upper Extremity Assessment: Generalized weakness   Lower Extremity Assessment Lower Extremity Assessment: Defer to PT evaluation       Communication Communication Communication: HOH   Cognition Arousal/Alertness: Awake/alert Behavior During Therapy: WFL for tasks assessed/performed Overall Cognitive Status: Impaired/Different from baseline Area of Impairment: Problem solving;Safety/judgement  Safety/Judgement: Decreased awareness of safety   Problem Solving: Slow processing;Requires verbal cues General Comments: HOH   General Comments  SpO2 on 2L maintained for short distance self care at sink     Exercises     Shoulder Instructions      Home Living Family/patient expects to be discharged to::  Private residence Living Arrangements: Alone Available Help at Discharge: Family;Available 24 hours/day Type of Home: House Home Access: Stairs to enter [employer] of Steps: 1   Home Layout: One level     Bathroom Shower/Tub: Tub/shower unit;Walk-in shower(uses walk in )   Bathroom Toilet: Handicapped height     Home Equipment: Environmental consultant - 2 wheels;Cane - single point;Shower seat   Additional Comments: Daughters live next door and nearby; can be available for as much support as needed      Prior Functioning/Environment Level of Independence: Independent        Comments: Indep without device; drives.         OT Problem List: Decreased activity tolerance;Decreased strength;Impaired balance (sitting and/or standing);Decreased safety awareness;Decreased knowledge of use of DME or AE;Decreased knowledge of precautions;Cardiopulmonary status limiting activity      OT Treatment/Interventions: Self-care/ADL training;Therapeutic exercise;DME and/or AE instruction;Therapeutic activities;Patient/family education;Balance training;Energy conservation    OT Goals(Current goals can be found in the care plan section) Acute Rehab OT Goals Patient Stated Goal: Get back home, hopefully without having to wear oxygen OT Goal Formulation: With patient Time For Goal Achievement: 08/13/18 Potential to Achieve Goals: Good  OT Frequency: Min 2X/week   Barriers to D/C:            Co-evaluation              AM-PAC OT "6 Clicks" Daily Activity     Outcome Measure Help from another person eating meals?: None Help from another person taking care of personal grooming?: A Little Help from another person toileting, which includes using toliet, bedpan, or urinal?: A Little Help from another person bathing (including washing, rinsing, drying)?: A Little Help from another person to put on and taking off regular upper body clothing?: None Help from another person to put on and taking  off regular lower body clothing?: A Little 6 Click Score: 20   End of Session Equipment Utilized During Treatment: Rolling walker;Oxygen Nurse Communication: Mobility status;Other (comment)(purewick)  Activity Tolerance: Patient tolerated treatment well Patient left: in bed;with call bell/phone within reach;with family/visitor present  OT Visit Diagnosis: Muscle weakness (generalized) (M62.81)                Time: 9518-8416 OT Time Calculation (min): 15 min Charges:  OT General Charges $OT Visit: 1 Visit OT Evaluation $OT Eval Moderate Complexity: [place], OT Acute Rehabilitation Services Pager [phone] Office [phone]   Delight Stare 07/30/2018, 5:10 PM

## 2018-07-30 NOTE — Progress Notes (Signed)
PROGRESS NOTE    Carla Foster  HQI:[id number] DOB: 1941/03/09 DOA: 07/25/2018 PCP: Christain Sacramento, MD  Brief Narrative: 78 year old female with history of asthma, hypertension, diabetes mellitus, peptic ulcer disease, was admitted to the ICU on 1/16 with acute hypoxic respiratory failure secondary to pneumonia and CHF exacerbation, she was also noted to have a sodium of 110. -Improved with diuresis and antibiotics -As part of her work-up, echocardiogram revealed normal EF, grade 2 diastolic dysfunction and large mobile density on the aortic valve measuring 0.7 x 2.5 cm. -She was transferred from ICU/PCCM to [facility] service today 1/10  Assessment & Plan:   Acute hypoxic respiratory failure -Due to community-acquired pneumonia and CHF exacerbation -Improving with antibiotics and diuretics, blood cultures are negative, sputum cultures grew MRSA -continue low-dose IV Lasix today transition to oral diuretics soon  -Now on doxycycline  -Wean off O2 -Ambulate, out of bed, PT OT  Acute on chronic diastolic CHF/severe pulmonary hypertension -Echo with preserved EF and grade 2 diastolic dysfunction -improving with diuresis, continue IV Lasix today -Needs further work-up for pulmonary hypertension as well, TEE will reassess PA pressures after diuresis  Large mobile density on aortic valve -No history suggestive of endocarditis, blood cultures are negative -Cardiology consult appreciated, plan for TEE on Monday   Hyponatremia -Suspect this is multifactorial, secondary to volume overloaded state, HCTZ on admission -In addition could have had a component of SIADH from pneumonia -Improving with above management  Acute on chronic microcytic anemia -History of peptic ulcer disease, no overt blood loss -Continue PPI, monitor hemoglobin  Stage III chronic kidney disease -Stable, monitor -Losartan, HCTZ on hold  Diabetes mellitus -CBG is uncontrolled,  -Follow-up hemoglobin A1c, started on  lantus, continue sliding scale  DVT prophylaxis: Heparin subcutaneous Code Status: Full code Family Communication: Discussed with daughter at bedside Disposition Plan: Home pending above work-up  Consultants:   PCCM  Cardiology   Procedures:   Antimicrobials:    Subjective: -Breathing better, wants to go home  Objective: Vitals:   07/29/18 2020 07/29/18 2217 07/30/18 0748 07/30/18 0822  BP:  (!) 127/42 (!) 143/57   Pulse: 85 79 83   Resp: 20 18 12    Temp:  97.8 F (36.6 C) 97.8 F (36.6 C)   TempSrc:  Oral Oral   SpO2: 95% 95% 95% 94%  Weight:      Height:        Intake/Output Summary (Last 24 hours) at 07/30/2018 1147 Last data filed at 07/29/2018 2056 Gross per 24 hour  Intake 250 ml  Output -  Net 250 ml   Filed Weights   07/27/18 0600 07/28/18 0600 07/29/18 0447  Weight: 75.8 kg 75.8 kg 78.1 kg    Examination:  Gen: Awake, Alert, Oriented X 3, frail elderly female, no distress HEENT: PERRLA, Neck supple, no JVD Lungs: Improved air movement bilaterally, decreased at both bases CVS: RRR,No Gallops,Rubs or new Murmurs Abd: soft, Non tender, non distended, BS present Extremities: No edema Skin: no new rashes Psychiatry: Judgement and insight appear normal. Mood & affect appropriate.     Data Reviewed:   CBC: Recent Labs  Lab 07/25/18 2056 07/26/18 0014 07/26/18 0145 07/27/18 0220 07/28/18 0930 07/30/18 0247  WBC 14.1*  --  15.1* 21.4* 16.5* 20.0*  NEUTROABS 12.6*  --   --   --   --   --   HGB 7.4* 8.2* 7.8* 7.4* 8.4* 7.6*  HCT 21.9* 24.0* 22.4* 21.9* 24.9* 22.7*  MCV 78.2*  --  79.2* 77.7* 81.1 80.5  PLT 297  --  338 337 425* 564   Basic Metabolic Panel: Recent Labs  Lab 07/26/18 0145  07/27/18 0220  07/28/18 0113 07/28/18 0533 07/28/18 0930 07/28/18 2047 07/30/18 0247  NA 110*   < > 114*   < > 122* 122* 124* 124* 125*  K 4.8  --  5.0  --   --   --  4.8 5.1 5.1  CL 76*  --  84*  --   --   --  93* 92* 90*  CO2 22  --  21*  --    --   --  24 25 27   GLUCOSE 281*  --  145*  --   --   --  233* 239* 172*  BUN 23  --  26*  --   --   --  26* 31* 33*  CREATININE 1.13*  --  1.12*  --   --   --  1.03* 1.13* 0.96  CALCIUM 8.7*  --  8.2*  --   --   --  8.3* 8.3* 8.7*  MG 1.5*  --   --   --   --   --   --   --   --   PHOS 3.2  --   --   --   --   --   --   --   --    < > = values in this interval not displayed.   GFR: Estimated Creatinine Clearance: 50.1 mL/min (by C-G formula based on SCr of 0.96 mg/dL). Liver Function Tests: Recent Labs  Lab 07/25/18 2056  AST 39  ALT 25  ALKPHOS 34*  BILITOT 1.3*  PROT 6.1*  ALBUMIN 2.8*   No results for input(s): LIPASE, AMYLASE in the last 168 hours. No results for input(s): AMMONIA in the last 168 hours. Coagulation Profile: Recent Labs  Lab 07/26/18 0010  INR 1.18   Cardiac Enzymes: No results for input(s): CKTOTAL, CKMB, CKMBINDEX, TROPONINI in the last 168 hours. BNP (last 3 results) No results for input(s): PROBNP in the last 8760 hours. HbA1C: No results for input(s): HGBA1C in the last 72 hours. CBG: Recent Labs  Lab 07/29/18 0810 07/29/18 1143 07/29/18 1655 07/29/18 2214 07/30/18 0747  GLUCAP 207* 187* 254* 132* 159*   Lipid Profile: No results for input(s): CHOL, HDL, LDLCALC, TRIG, CHOLHDL, LDLDIRECT in the last 72 hours. Thyroid Function Tests: No results for input(s): TSH, T4TOTAL, FREET4, T3FREE, THYROIDAB in the last 72 hours. Anemia Panel: No results for input(s): VITAMINB12, FOLATE, FERRITIN, TIBC, IRON, RETICCTPCT in the last 72 hours. Urine analysis:    Component Value Date/Time   COLORURINE YELLOW 07/26/2018 0633   APPEARANCEUR HAZY (A) 07/26/2018 0633   LABSPEC 1.017 07/26/2018 0633   PHURINE 5.0 07/26/2018 0633   GLUCOSEU 150 (A) 07/26/2018 0633   HGBUR NEGATIVE 07/26/2018 0633   BILIRUBINUR NEGATIVE 07/26/2018 0633   KETONESUR 5 (A) 07/26/2018 0633   PROTEINUR 100 (A) 07/26/2018 0633   NITRITE NEGATIVE 07/26/2018 0633    LEUKOCYTESUR NEGATIVE 07/26/2018 0633   Sepsis Labs: @LABRCNTIP (procalcitonin:4,lacticidven:4)  ) Recent Results (from the past 240 hour(s))  Culture, blood (routine x 2)     Status: None (Preliminary result)   Collection Time: 07/26/18  1:45 AM  Result Value Ref Range Status   Specimen Description BLOOD RIGHT ANTECUBITAL  Final   Special Requests   Final    BOTTLES DRAWN AEROBIC AND ANAEROBIC Blood Culture adequate volume  Culture   Final    NO GROWTH 4 DAYS Performed at [facility], [place] [address] [place], [place] [address]    Report Status PENDING  Incomplete  MRSA PCR Screening     Status: Abnormal   Collection Time: 07/26/18  2:23 AM  Result Value Ref Range Status   MRSA by PCR POSITIVE (A) NEGATIVE Final    Comment:        The GeneXpert MRSA Assay (FDA approved for NASAL specimens only), is one component of a comprehensive MRSA colonization surveillance program. It is not intended to diagnose MRSA infection nor to guide or monitor treatment for MRSA infections. RESULT CALLED TO, READ BACK BY AND VERIFIED WITH: Arville Go RN [id number] 07/26/18 A BROWNING Performed at [facility], [place] [address] [place], [place] [address]   Culture, blood (routine x 2)     Status: None (Preliminary result)   Collection Time: 07/26/18  3:04 AM  Result Value Ref Range Status   Specimen Description BLOOD RIGHT HAND  Final   Special Requests   Final    BOTTLES DRAWN AEROBIC ONLY Blood Culture results may not be optimal due to an excessive volume of blood received in culture bottles   Culture   Final    NO GROWTH 4 DAYS Performed at [facility], [place] [address] [place], [place] [address]    Report Status PENDING  Incomplete  Urine culture     Status: None   Collection Time: 07/26/18  7:12 AM  Result Value Ref Range Status   Specimen Description URINE, RANDOM  Final   Special Requests NONE  Final   Culture   Final    NO GROWTH Performed at [facility],  [address] [place], [place] [address]    Report Status 07/27/2018 FINAL  Final  Respiratory Panel by PCR     Status: None   Collection Time: 07/26/18  7:13 AM  Result Value Ref Range Status   Adenovirus NOT DETECTED NOT DETECTED Final   Coronavirus 229E NOT DETECTED NOT DETECTED Final   Coronavirus HKU1 NOT DETECTED NOT DETECTED Final   Coronavirus NL63 NOT DETECTED NOT DETECTED Final   Coronavirus OC43 NOT DETECTED NOT DETECTED Final   Metapneumovirus NOT DETECTED NOT DETECTED Final   Rhinovirus / Enterovirus NOT DETECTED NOT DETECTED Final   Influenza A NOT DETECTED NOT DETECTED Final   Influenza B NOT DETECTED NOT DETECTED Final   Parainfluenza Virus 1 NOT DETECTED NOT DETECTED Final   Parainfluenza Virus 2 NOT DETECTED NOT DETECTED Final   Parainfluenza Virus 3 NOT DETECTED NOT DETECTED Final   Parainfluenza Virus 4 NOT DETECTED NOT DETECTED Final   Respiratory Syncytial Virus NOT DETECTED NOT DETECTED Final   Bordetella pertussis NOT DETECTED NOT DETECTED Final   Chlamydophila pneumoniae NOT DETECTED NOT DETECTED Final   Mycoplasma pneumoniae NOT DETECTED NOT DETECTED Final    Comment: Performed at [facility], [place] [address], [place], [place] [address]  Expectorated sputum assessment w rflx to resp cult     Status: None   Collection Time: 07/26/18  5:50 PM  Result Value Ref Range Status   Specimen Description EXPECTORATED SPUTUM  Final   Special Requests NONE  Final   Sputum evaluation   Final    THIS SPECIMEN IS ACCEPTABLE FOR SPUTUM CULTURE Performed at [facility], [place] [address] [place], [place] [address]    Report Status 07/26/2018 FINAL  Final  Culture, respiratory     Status: None   Collection Time: 07/26/18  5:50 PM  Result Value Ref Range Status   Specimen Description EXPECTORATED SPUTUM  Final   Special Requests NONE Reflexed from [id number]  Final   Gram Stain   Final    ABUNDANT WBC PRESENT, PREDOMINANTLY PMN ABUNDANT GRAM POSITIVE  COCCI Performed at [facility], [address] [place], [place] [address]    Culture   Final    ABUNDANT METHICILLIN RESISTANT STAPHYLOCOCCUS AUREUS   Report Status 07/29/2018 FINAL  Final   Organism ID, Bacteria METHICILLIN RESISTANT STAPHYLOCOCCUS AUREUS  Final      Susceptibility   Methicillin resistant staphylococcus aureus - MIC*    CIPROFLOXACIN >=8 RESISTANT Resistant     ERYTHROMYCIN >=8 RESISTANT Resistant     GENTAMICIN <=0.5 SENSITIVE Sensitive     OXACILLIN >=4 RESISTANT Resistant     TETRACYCLINE <=1 SENSITIVE Sensitive     VANCOMYCIN <=0.5 SENSITIVE Sensitive     TRIMETH/SULFA <=10 SENSITIVE Sensitive     CLINDAMYCIN <=0.25 SENSITIVE Sensitive     RIFAMPIN <=0.5 SENSITIVE Sensitive     Inducible Clindamycin NEGATIVE Sensitive     * ABUNDANT METHICILLIN RESISTANT STAPHYLOCOCCUS AUREUS         Radiology Studies: No results found.      Scheduled Meds: . amLODipine  5 mg Oral Daily  . budesonide (PULMICORT) nebulizer solution  0.5 mg Nebulization BID  . carvedilol  25 mg Oral BID WC  . Chlorhexidine Gluconate Cloth  6 each Topical Q0600  . doxazosin  4 mg Oral QHS  . doxycycline  100 mg Oral Q12H  . feeding supplement (ENSURE ENLIVE)  237 mL Oral BID BM  . ferrous sulfate  325 mg Oral QODAY  . furosemide  20 mg Intravenous Q12H  . heparin  5,000 Units Subcutaneous Q8H  . insulin aspart  0-15 Units Subcutaneous TID WC  . insulin glargine  20 Units Subcutaneous Daily  . ipratropium-albuterol  3 mL Nebulization TID  . mouth rinse  15 mL Mouth Rinse BID  . multivitamin with minerals  1 tablet Oral Daily  . mupirocin ointment  1 application Nasal BID  . pantoprazole  40 mg Oral QAC breakfast  . simvastatin  20 mg Oral QHS   Continuous Infusions:    LOS: 4 days    Time spent: 69min    Domenic Polite, MD [facility] Page via [email or web address], password [email or web address] After 7PM please contact night-coverage  07/30/2018, 11:47 AM

## 2018-07-30 NOTE — Progress Notes (Signed)
Physical Therapy Treatment Patient Details Name: Carla Foster MRN: [id number] DOB: 02-24-1941 Today's Date: 07/30/2018    History of Present Illness Pt is a 78 y.o. female admitted 07/25/18 with acute hypoxic respiratory failure secondary to PNA and CHF exacerbation; also with hyponatremia. Transfer out of ICU on 1/10. Pt with large mobile density on aortic valve; awaiting TEE. PMH includes HTN, CKD III, CHF, DM, asthma.    PT Comments    Pt ambulated in hall this session with min guard for safety. Pt continues to desat with activity, requiring 3L O2 to maintian SpO2 >90%. Daughter and grandson present during session and very helpful and supportive. Patient would benefit from continued skilled PT to increase activity tolerance and functional independence. Will continue to follow acutely.    Follow Up Recommendations  Home health PT;Supervision/Assistance - 24 hour     Equipment Recommendations  None recommended by PT    Recommendations for Other Services       Precautions / Restrictions Precautions Precautions: Fall Precaution Comments: Currently requiring 2L O2 [place] to maintain >90% Restrictions Weight Bearing Restrictions: No    Mobility  Bed Mobility Overal bed mobility: Modified Independent             General bed mobility comments: no assist required. increased time and effort  Transfers Overall transfer level: Needs assistance Equipment used: Rolling walker (2 wheeled) Transfers: Sit to/from Stand Sit to Stand: Min guard         General transfer comment: min guard for safety. Cues for hand placement.   Ambulation/Gait Ambulation/Gait assistance: Min guard Gait Distance (Feet): 125 Feet Assistive device: Rolling walker (2 wheeled) Gait Pattern/deviations: Step-through pattern;Decreased stride length;Trunk flexed;Decreased step length - right;Decreased step length - left Gait velocity: Decreased   General Gait Details: Pt with slow steady gait using RW.  Min guard for safety. Cues for RW proximity and postural control.   Stairs             Wheelchair Mobility    Modified Rankin (Stroke Patients Only)       Balance Overall balance assessment: Needs assistance   Sitting balance-Leahy Scale: Fair       Standing balance-Leahy Scale: Poor Standing balance comment: Reliant on at least single UE support for standing balance                            Cognition Arousal/Alertness: Awake/alert Behavior During Therapy: WFL for tasks assessed/performed Overall Cognitive Status: Impaired/Different from baseline Area of Impairment: Problem solving;Safety/judgement                         Safety/Judgement: Decreased awareness of safety   Problem Solving: Slow processing;Requires verbal cues General Comments: HOH      Exercises      General Comments General comments (skin integrity, edema, etc.): SpO2 at at 93% on 2L O2 at rest. Once ambulating pt's SpO2 dropped to 87% and did not recover with standing rest break and pursed lipped breathing. O2 increased to 3L for remainder of ambulation.      Pertinent Vitals/Pain Pain Assessment: No/denies pain    Home Living                      Prior Function            PT Goals (current goals can now be found in the care plan section) Acute Rehab PT  Goals Patient Stated Goal: Get back home, hopefully without having to wear oxygen PT Goal Formulation: With patient/family Time For Goal Achievement: 08/12/18 Potential to Achieve Goals: Good Progress towards PT goals: Progressing toward goals    Frequency    Min 3X/week      PT Plan Current plan remains appropriate    Co-evaluation              AM-PAC PT "6 Clicks" Mobility   Outcome Measure  Help needed turning from your back to your side while in a flat bed without using bedrails?: None Help needed moving from lying on your back to sitting on the side of a flat bed without using  bedrails?: None Help needed moving to and from a bed to a chair (including a wheelchair)?: A Little Help needed standing up from a chair using your arms (e.g., wheelchair or bedside chair)?: A Little Help needed to walk in hospital room?: A Little Help needed climbing 3-5 steps with a railing? : A Lot 6 Click Score: 19    End of Session Equipment Utilized During Treatment: Gait belt;Oxygen Activity Tolerance: Patient tolerated treatment well Patient left: in chair;with call bell/phone within reach;with family/visitor present Nurse Communication: Mobility status PT Visit Diagnosis: Other abnormalities of gait and mobility (R26.89);Muscle weakness (generalized) (M62.81)     Time: 1470-9295 PT Time Calculation (min) (ACUTE ONLY): 25 min  Charges:  $Gait Training: 23-37 mins                     Benjiman Core, [place] Pager [phone] Acute Rehab   Allena Katz 07/30/2018, 3:45 PM

## 2018-07-30 NOTE — Progress Notes (Signed)
Interval CV note: spoke to Dr. Broadus John, we will see as needed over the weekend. Plan for TEE on Monday.

## 2018-07-31 DIAGNOSIS — I5033 Acute on chronic diastolic (congestive) heart failure: Secondary | ICD-10-CM | POA: Diagnosis present

## 2018-07-31 LAB — CULTURE, BLOOD (ROUTINE X 2)
Culture: NO GROWTH
Culture: NO GROWTH
Special Requests: ADEQUATE

## 2018-07-31 LAB — BASIC METABOLIC PANEL
Anion gap: 9 (ref 5–15)
BUN: 36 mg/dL — ABNORMAL HIGH (ref 8–23)
CO2: 28 mmol/L (ref 22–32)
Calcium: 8.7 mg/dL — ABNORMAL LOW (ref 8.9–10.3)
Chloride: 89 mmol/L — ABNORMAL LOW (ref 98–111)
Creatinine, Ser: 0.96 mg/dL (ref 0.44–1.00)
GFR calc Af Amer: >60 mL/min (ref >60)
GFR calc non Af Amer: 56 mL/min — ABNORMAL LOW (ref >60)
Glucose, Bld: 208 mg/dL — ABNORMAL HIGH (ref 70–99)
Potassium: 5 mmol/L (ref 3.5–5.1)
Sodium: 126 mmol/L — ABNORMAL LOW (ref 135–145)

## 2018-07-31 LAB — CBC
HEMATOCRIT: 22.1 % — AB (ref 36.0–46.0)
Hemoglobin: 7.2 g/dL — ABNORMAL LOW (ref 12.0–15.0)
MCH: 26 pg (ref 26.0–34.0)
MCHC: 32.6 g/dL (ref 30.0–36.0)
MCV: 79.8 fL — ABNORMAL LOW (ref 80.0–100.0)
Platelets: 382 10*3/uL (ref 150–400)
RBC: 2.77 MIL/uL — ABNORMAL LOW (ref 3.87–5.11)
RDW: 13.6 % (ref 11.5–15.5)
WBC: 16 10*3/uL — ABNORMAL HIGH (ref 4.0–10.5)
nRBC: 0 % (ref 0.0–0.2)

## 2018-07-31 LAB — IRON AND TIBC
Iron: 17 ug/dL — ABNORMAL LOW (ref 28–170)
Saturation Ratios: 6 % — ABNORMAL LOW (ref 10.4–31.8)
TIBC: 270 ug/dL (ref 250–450)
UIBC: 253 ug/dL

## 2018-07-31 LAB — VITAMIN B12: Vitamin B-12: 1000 pg/mL — ABNORMAL HIGH (ref 180–914)

## 2018-07-31 LAB — RETICULOCYTES
Immature Retic Fract: 10 % (ref 2.3–15.9)
RBC.: 2.72 MIL/uL — ABNORMAL LOW (ref 3.87–5.11)
RETIC CT PCT: 0.9 % (ref 0.4–3.1)
Retic Count, Absolute: 25.3 10*3/uL (ref 19.0–186.0)

## 2018-07-31 LAB — HEMOGLOBIN A1C
HEMOGLOBIN A1C: 7.3 % — AB (ref 4.8–5.6)
Mean Plasma Glucose: 162.81 mg/dL

## 2018-07-31 LAB — PREPARE RBC (CROSSMATCH)

## 2018-07-31 LAB — GLUCOSE, CAPILLARY
Glucose-Capillary: 118 mg/dL — ABNORMAL HIGH (ref 70–99)
Glucose-Capillary: 138 mg/dL — ABNORMAL HIGH (ref 70–99)
Glucose-Capillary: 98 mg/dL (ref 70–99)
Glucose-Capillary: 209 mg/dL — ABNORMAL HIGH (ref 70–99)

## 2018-07-31 LAB — FERRITIN: Ferritin: 130 ng/mL (ref 11–307)

## 2018-07-31 LAB — FOLATE: Folate: 11.8 ng/mL (ref >5.9)

## 2018-07-31 MED ORDER — FUROSEMIDE 10 MG/ML IJ SOLN
20.0000 mg | Freq: Two times a day (BID) | INTRAMUSCULAR | Status: AC
  Administered 2018-07-31 (×2): 20 mg via INTRAVENOUS
  Filled 2018-07-31 (×2): qty 2

## 2018-07-31 MED ORDER — SODIUM CHLORIDE 0.9% IV SOLUTION
Freq: Once | INTRAVENOUS | Status: AC
  Administered 2018-07-31: 09:00:00 via INTRAVENOUS

## 2018-07-31 NOTE — Progress Notes (Signed)
Interval CV note: will see as needed over weekend. TEE for AV lesion and pulmonary pressures if able to obtain, plan for Monday.

## 2018-07-31 NOTE — Plan of Care (Signed)
Pt verbalizes understanding of fall prevention measures, use of call bell for assistance.

## 2018-07-31 NOTE — Progress Notes (Addendum)
PROGRESS NOTE    Carla Foster  CHE:[id number] DOB: 06-04-1941 DOA: 07/25/2018 PCP: Christain Sacramento, MD  Brief Narrative: 78 year old female with history of asthma, hypertension, diabetes mellitus, peptic ulcer disease, was admitted to the ICU on 1/16 with acute hypoxic respiratory failure secondary to pneumonia and CHF exacerbation, she was also noted to have a sodium of 110. -Improved with diuresis and antibiotics -As part of her work-up, echocardiogram revealed normal EF, grade 2 diastolic dysfunction and large mobile density on the aortic valve measuring 0.7 x 2.5 cm. -She was transferred from ICU/PCCM to [facility] service 1/10  Assessment & Plan:   Acute hypoxic respiratory failure -Due to community-acquired pneumonia and CHF exacerbation -Improving with antibiotics and diuretics, blood cultures are negative, sputum cultures grew MRSA  -keep her on Lasix by IV 1 more day, transition to oral diuretics tomorrow -continue doxycycline for 7days total  -Wean off O2 -Ambulate, out of bed, PT OT  Acute on chronic diastolic CHF/severe pulmonary hypertension -Echo with preserved EF and grade 2 diastolic dysfunction -improving with diuresis, continue IV Lasix today -Needs further work-up for pulmonary hypertension as well, TEE tomorrow will reassess PA pressures after diuresis  Large mobile density on aortic valve -No history suggestive of endocarditis, blood cultures are negative -Cardiology consult appreciated, plan for TEE on Monday  -stable and afebrile, leukocytosis is improving  Hyponatremia -Suspect this is multifactorial, secondary to volume overloaded state, HCTZ on admission -In addition could have had a component of SIADH from pneumonia -Improving with above management  Acute on chronic microcytic anemia -History of peptic ulcer disease, no overt blood loss -History of iron deficiency anemia on oral iron at baseline  -Hemoglobin has trended down further to 7.2, will give 1 unit  of PRBC today  -Give IV iron tomorrow   Stage III chronic kidney disease -Stable, monitor -Losartan, HCTZ on hold  Diabetes mellitus -CBG is uncontrolled,  -check hemoglobin A1c, started on lantus, continue sliding scale -improving now  DVT prophylaxis: Heparin subcutaneous Code Status: Full code Family Communication: Discussed with daughter at bedside Disposition Plan: Home pending above work-up  Consultants:   PCCM  Cardiology   Procedures:   Antimicrobials:    Subjective: -breathing better, improving overall  Objective: Vitals:   07/30/18 2322 07/30/18 2333 07/31/18 0050 07/31/18 0814  BP: (!) 133/53 (!) 133/53 (!) 131/47 (!) 147/61  Pulse:  75 80 82  Resp:  16 16   Temp:  97.7 F (36.5 C) 98.3 F (36.8 C) 98.1 F (36.7 C)  TempSrc:  Oral Oral Oral  SpO2:  100% 95% 100%  Weight:      Height:        Intake/Output Summary (Last 24 hours) at 07/31/2018 1118 Last data filed at 07/31/2018 0300 Gross per 24 hour  Intake -  Output 700 ml  Net -700 ml   Filed Weights   07/27/18 0600 07/28/18 0600 07/29/18 0447  Weight: 75.8 kg 75.8 kg 78.1 kg    Examination:  Gen: Awake, Alert, Oriented X 3, frail, elderly female HEENT: PERRLA, Neck supple, no JVD Lungs: Improved air movement B/L, decreased at both bases CVS: RRR,No Gallops,Rubs or new Murmurs Abd: soft, Non tender, non distended, BS present Extremities: No Cyanosis, Clubbing or edema Skin: no new rashes Psychiatry: Judgement and insight appear normal. Mood & affect appropriate.     Data Reviewed:   CBC: Recent Labs  Lab 07/25/18 2056  07/26/18 0145 07/27/18 0220 07/28/18 0930 07/30/18 0247 07/31/18 0257  WBC 14.1*  --  15.1* 21.4* 16.5* 20.0* 16.0*  NEUTROABS 12.6*  --   --   --   --   --   --   HGB 7.4*   < > 7.8* 7.4* 8.4* 7.6* 7.2*  HCT 21.9*   < > 22.4* 21.9* 24.9* 22.7* 22.1*  MCV 78.2*  --  79.2* 77.7* 81.1 80.5 79.8*  PLT 297  --  338 337 425* 392 382   < > = values in this  interval not displayed.   Basic Metabolic Panel: Recent Labs  Lab 07/26/18 0145  07/27/18 0220  07/28/18 0533 07/28/18 0930 07/28/18 2047 07/30/18 0247 07/31/18 0257  NA 110*   < > 114*   < > 122* 124* 124* 125* 126*  K 4.8  --  5.0  --   --  4.8 5.1 5.1 5.0  CL 76*  --  84*  --   --  93* 92* 90* 89*  CO2 22  --  21*  --   --  24 25 27 28   GLUCOSE 281*  --  145*  --   --  233* 239* 172* 208*  BUN 23  --  26*  --   --  26* 31* 33* 36*  CREATININE 1.13*  --  1.12*  --   --  1.03* 1.13* 0.96 0.96  CALCIUM 8.7*  --  8.2*  --   --  8.3* 8.3* 8.7* 8.7*  MG 1.5*  --   --   --   --   --   --   --   --   PHOS 3.2  --   --   --   --   --   --   --   --    < > = values in this interval not displayed.   GFR: Estimated Creatinine Clearance: 50.1 mL/min (by C-G formula based on SCr of 0.96 mg/dL). Liver Function Tests: Recent Labs  Lab 07/25/18 2056  AST 39  ALT 25  ALKPHOS 34*  BILITOT 1.3*  PROT 6.1*  ALBUMIN 2.8*   No results for input(s): LIPASE, AMYLASE in the last 168 hours. No results for input(s): AMMONIA in the last 168 hours. Coagulation Profile: Recent Labs  Lab 07/26/18 0010  INR 1.18   Cardiac Enzymes: No results for input(s): CKTOTAL, CKMB, CKMBINDEX, TROPONINI in the last 168 hours. BNP (last 3 results) No results for input(s): PROBNP in the last 8760 hours. HbA1C: No results for input(s): HGBA1C in the last 72 hours. CBG: Recent Labs  Lab 07/30/18 0747 07/30/18 1147 07/30/18 1655 07/30/18 2131 07/31/18 0810  GLUCAP 159* 183* 93 230* 118*   Lipid Profile: No results for input(s): CHOL, HDL, LDLCALC, TRIG, CHOLHDL, LDLDIRECT in the last 72 hours. Thyroid Function Tests: No results for input(s): TSH, T4TOTAL, FREET4, T3FREE, THYROIDAB in the last 72 hours. Anemia Panel: Recent Labs    07/31/18 0814  VITAMINB12 1,000*  FOLATE 11.8  FERRITIN 130  TIBC 270  IRON 17*  RETICCTPCT 0.9   Urine analysis:    Component Value Date/Time   COLORURINE  YELLOW 07/26/2018 0633   APPEARANCEUR HAZY (A) 07/26/2018 0633   LABSPEC 1.017 07/26/2018 0633   PHURINE 5.0 07/26/2018 0633   GLUCOSEU 150 (A) 07/26/2018 0633   HGBUR NEGATIVE 07/26/2018 0633   BILIRUBINUR NEGATIVE 07/26/2018 0633   KETONESUR 5 (A) 07/26/2018 0633   PROTEINUR 100 (A) 07/26/2018 0633   NITRITE NEGATIVE 07/26/2018 0633   LEUKOCYTESUR NEGATIVE 07/26/2018 0633   Sepsis Labs: @  LABRCNTIP(procalcitonin:4,lacticidven:4)  ) Recent Results (from the past 240 hour(s))  Culture, blood (routine x 2)     Status: None   Collection Time: 07/26/18  1:45 AM  Result Value Ref Range Status   Specimen Description BLOOD RIGHT ANTECUBITAL  Final   Special Requests   Final    BOTTLES DRAWN AEROBIC AND ANAEROBIC Blood Culture adequate volume   Culture   Final    NO GROWTH 5 DAYS Performed at [facility], [address], [place], [place] [address]    Report Status 07/31/2018 FINAL  Final  MRSA PCR Screening     Status: Abnormal   Collection Time: 07/26/18  2:23 AM  Result Value Ref Range Status   MRSA by PCR POSITIVE (A) NEGATIVE Final    Comment:        The GeneXpert MRSA Assay (FDA approved for NASAL specimens only), is one component of a comprehensive MRSA colonization surveillance program. It is not intended to diagnose MRSA infection nor to guide or monitor treatment for MRSA infections. RESULT CALLED TO, READ BACK BY AND VERIFIED WITH: Arville Go RN [id number] 07/26/18 A BROWNING Performed at [facility], [place] [address], [place], [place] [address]   Culture, blood (routine x 2)     Status: None   Collection Time: 07/26/18  3:04 AM  Result Value Ref Range Status   Specimen Description BLOOD RIGHT HAND  Final   Special Requests   Final    BOTTLES DRAWN AEROBIC ONLY Blood Culture results may not be optimal due to an excessive volume of blood received in culture bottles   Culture   Final    NO GROWTH 5 DAYS Performed at [facility], [place] [address],  [place], [place] [address]    Report Status 07/31/2018 FINAL  Final  Urine culture     Status: None   Collection Time: 07/26/18  7:12 AM  Result Value Ref Range Status   Specimen Description URINE, RANDOM  Final   Special Requests NONE  Final   Culture   Final    NO GROWTH Performed at [facility], [place] [address] [place], [place] [address]    Report Status 07/27/2018 FINAL  Final  Respiratory Panel by PCR     Status: None   Collection Time: 07/26/18  7:13 AM  Result Value Ref Range Status   Adenovirus NOT DETECTED NOT DETECTED Final   Coronavirus 229E NOT DETECTED NOT DETECTED Final   Coronavirus HKU1 NOT DETECTED NOT DETECTED Final   Coronavirus NL63 NOT DETECTED NOT DETECTED Final   Coronavirus OC43 NOT DETECTED NOT DETECTED Final   Metapneumovirus NOT DETECTED NOT DETECTED Final   Rhinovirus / Enterovirus NOT DETECTED NOT DETECTED Final   Influenza A NOT DETECTED NOT DETECTED Final   Influenza B NOT DETECTED NOT DETECTED Final   Parainfluenza Virus 1 NOT DETECTED NOT DETECTED Final   Parainfluenza Virus 2 NOT DETECTED NOT DETECTED Final   Parainfluenza Virus 3 NOT DETECTED NOT DETECTED Final   Parainfluenza Virus 4 NOT DETECTED NOT DETECTED Final   Respiratory Syncytial Virus NOT DETECTED NOT DETECTED Final   Bordetella pertussis NOT DETECTED NOT DETECTED Final   Chlamydophila pneumoniae NOT DETECTED NOT DETECTED Final   Mycoplasma pneumoniae NOT DETECTED NOT DETECTED Final    Comment: Performed at [facility], [place] [address], [place],  [address]  Expectorated sputum assessment w rflx to resp cult     Status: None  Collection Time: 07/26/18  5:50 PM  Result Value Ref Range Status   Specimen Description EXPECTORATED SPUTUM  Final   Special Requests NONE  Final   Sputum evaluation   Final    THIS SPECIMEN IS ACCEPTABLE FOR SPUTUM CULTURE Performed at [facility], [place] [address], [place], [place] [address]    Report Status 07/26/2018 FINAL  Final   Culture, respiratory     Status: None   Collection Time: 07/26/18  5:50 PM  Result Value Ref Range Status   Specimen Description EXPECTORATED SPUTUM  Final   Special Requests NONE Reflexed from [id number]  Final   Gram Stain   Final    ABUNDANT WBC PRESENT, PREDOMINANTLY PMN ABUNDANT GRAM POSITIVE COCCI Performed at [facility], [place] [address], [place], [place] [address]    Culture   Final    ABUNDANT METHICILLIN RESISTANT STAPHYLOCOCCUS AUREUS   Report Status 07/29/2018 FINAL  Final   Organism ID, Bacteria METHICILLIN RESISTANT STAPHYLOCOCCUS AUREUS  Final      Susceptibility   Methicillin resistant staphylococcus aureus - MIC*    CIPROFLOXACIN >=8 RESISTANT Resistant     ERYTHROMYCIN >=8 RESISTANT Resistant     GENTAMICIN <=0.5 SENSITIVE Sensitive     OXACILLIN >=4 RESISTANT Resistant     TETRACYCLINE <=1 SENSITIVE Sensitive     VANCOMYCIN <=0.5 SENSITIVE Sensitive     TRIMETH/SULFA <=10 SENSITIVE Sensitive     CLINDAMYCIN <=0.25 SENSITIVE Sensitive     RIFAMPIN <=0.5 SENSITIVE Sensitive     Inducible Clindamycin NEGATIVE Sensitive     * ABUNDANT METHICILLIN RESISTANT STAPHYLOCOCCUS AUREUS         Radiology Studies: No results found.      Scheduled Meds: . amLODipine  5 mg Oral Daily  . budesonide (PULMICORT) nebulizer solution  0.5 mg Nebulization BID  . carvedilol  25 mg Oral BID WC  . doxazosin  4 mg Oral QHS  . doxycycline  100 mg Oral Q12H  . feeding supplement (ENSURE ENLIVE)  237 mL Oral BID BM  . ferrous sulfate  325 mg Oral QODAY  . furosemide  20 mg Intravenous Q12H  . heparin  5,000 Units Subcutaneous Q8H  . insulin aspart  0-15 Units Subcutaneous TID WC  . insulin glargine  20 Units Subcutaneous Daily  . ipratropium-albuterol  3 mL Nebulization TID  . mouth rinse  15 mL Mouth Rinse BID  . multivitamin with minerals  1 tablet Oral Daily  . pantoprazole  40 mg Oral QAC breakfast  . simvastatin  20 mg Oral QHS   Continuous Infusions:     LOS: 5 days    Time spent: 38min    Domenic Polite, MD [facility] Page via [email or web address], password [email or web address] After 7PM please contact night-coverage  07/31/2018, 11:18 AM

## 2018-08-01 ENCOUNTER — Inpatient Hospital Stay (HOSPITAL_COMMUNITY): Payer: Medicare Other | Admitting: Certified Registered Nurse Anesthetist

## 2018-08-01 ENCOUNTER — Encounter (HOSPITAL_COMMUNITY)
Admission: EM | Disposition: A | Discharge status: Home Health | Payer: Self-pay | Source: Home / Self Care | Attending: Internal Medicine

## 2018-08-01 ENCOUNTER — Inpatient Hospital Stay (HOSPITAL_COMMUNITY): Payer: Medicare Other

## 2018-08-01 ENCOUNTER — Encounter (HOSPITAL_COMMUNITY): Payer: Self-pay | Admitting: *Deleted

## 2018-08-01 DIAGNOSIS — I5033 Acute on chronic diastolic (congestive) heart failure: Secondary | ICD-10-CM

## 2018-08-01 DIAGNOSIS — I340 Nonrheumatic mitral (valve) insufficiency: Secondary | ICD-10-CM

## 2018-08-01 HISTORY — PX: TEE WITHOUT CARDIOVERSION: SHX5443

## 2018-08-01 LAB — BASIC METABOLIC PANEL
Anion gap: 8 (ref 5–15)
Anion gap: 9 (ref 5–15)
Anion gap: 8 (ref 5–15)
BUN: 41 mg/dL — ABNORMAL HIGH (ref 8–23)
BUN: 36 mg/dL — ABNORMAL HIGH (ref 8–23)
BUN: 33 mg/dL — ABNORMAL HIGH (ref 8–23)
CHLORIDE: 89 mmol/L — AB (ref 98–111)
CHLORIDE: 87 mmol/L — AB (ref 98–111)
CO2: 31 mmol/L (ref 22–32)
CO2: 32 mmol/L (ref 22–32)
CO2: 32 mmol/L (ref 22–32)
Calcium: 9.2 mg/dL (ref 8.9–10.3)
Calcium: 9.2 mg/dL (ref 8.9–10.3)
Calcium: 8.9 mg/dL (ref 8.9–10.3)
Chloride: 87 mmol/L — ABNORMAL LOW (ref 98–111)
Creatinine, Ser: 0.86 mg/dL (ref 0.44–1.00)
Creatinine, Ser: 0.88 mg/dL (ref 0.44–1.00)
Creatinine, Ser: 0.87 mg/dL (ref 0.44–1.00)
GFR calc Af Amer: >60 mL/min (ref >60)
GFR calc Af Amer: >60 mL/min (ref >60)
GFR calc Af Amer: >60 mL/min (ref >60)
GFR calc non Af Amer: >60 mL/min (ref >60)
GFR calc non Af Amer: >60 mL/min (ref >60)
GFR calc non Af Amer: >60 mL/min (ref >60)
Glucose, Bld: 94 mg/dL (ref 70–99)
Glucose, Bld: 106 mg/dL — ABNORMAL HIGH (ref 70–99)
Glucose, Bld: 174 mg/dL — ABNORMAL HIGH (ref 70–99)
Potassium: 5.4 mmol/L — ABNORMAL HIGH (ref 3.5–5.1)
Potassium: 5.1 mmol/L (ref 3.5–5.1)
Potassium: 4.5 mmol/L (ref 3.5–5.1)
Sodium: 126 mmol/L — ABNORMAL LOW (ref 135–145)
Sodium: 130 mmol/L — ABNORMAL LOW (ref 135–145)
Sodium: 127 mmol/L — ABNORMAL LOW (ref 135–145)

## 2018-08-01 LAB — TYPE AND SCREEN
ABO/RH(D): B NEG
Antibody Screen: NEGATIVE
Unit division: 0

## 2018-08-01 LAB — CBC
HCT: 25.9 % — ABNORMAL LOW (ref 36.0–46.0)
Hemoglobin: 8.8 g/dL — ABNORMAL LOW (ref 12.0–15.0)
MCH: 27.1 pg (ref 26.0–34.0)
MCHC: 34 g/dL (ref 30.0–36.0)
MCV: 79.7 fL — AB (ref 80.0–100.0)
Platelets: 407 10*3/uL — ABNORMAL HIGH (ref 150–400)
RBC: 3.25 MIL/uL — ABNORMAL LOW (ref 3.87–5.11)
RDW: 13.9 % (ref 11.5–15.5)
WBC: 16 10*3/uL — ABNORMAL HIGH (ref 4.0–10.5)
nRBC: 0 % (ref 0.0–0.2)

## 2018-08-01 LAB — GLUCOSE, CAPILLARY
Glucose-Capillary: 89 mg/dL (ref 70–99)
Glucose-Capillary: 100 mg/dL — ABNORMAL HIGH (ref 70–99)
Glucose-Capillary: 127 mg/dL — ABNORMAL HIGH (ref 70–99)
Glucose-Capillary: 172 mg/dL — ABNORMAL HIGH (ref 70–99)

## 2018-08-01 LAB — BPAM RBC
Blood Product Expiration Date: 202001242359
ISSUE DATE / TIME: 202001121316
Unit Type and Rh: 1700

## 2018-08-01 SURGERY — ECHOCARDIOGRAM, TRANSESOPHAGEAL
Anesthesia: Monitor Anesthesia Care

## 2018-08-01 MED ORDER — PROPOFOL 500 MG/50ML IV EMUL
INTRAVENOUS | Status: DC | PRN
  Administered 2018-08-01: 100 ug/kg/min via INTRAVENOUS

## 2018-08-01 MED ORDER — SODIUM CHLORIDE 0.9 % IV SOLN: INTRAVENOUS | Status: DC

## 2018-08-01 MED ORDER — FUROSEMIDE 10 MG/ML IJ SOLN
20.0000 mg | Freq: Once | INTRAMUSCULAR | Status: AC
  Administered 2018-08-01: 20 mg via INTRAVENOUS
  Filled 2018-08-01: qty 2

## 2018-08-01 MED ORDER — SODIUM CHLORIDE 0.9 % IV SOLN
510.0000 mg | Freq: Once | INTRAVENOUS | Status: AC
  Administered 2018-08-01: 510 mg via INTRAVENOUS
  Filled 2018-08-01: qty 17

## 2018-08-01 MED ORDER — LIDOCAINE 2% (20 MG/ML) 5 ML SYRINGE
INTRAMUSCULAR | Status: DC | PRN
  Administered 2018-08-01: 80 mg via INTRAVENOUS

## 2018-08-01 MED ORDER — PROPOFOL 10 MG/ML IV BOLUS
INTRAVENOUS | Status: DC | PRN
  Administered 2018-08-01: 40 mg via INTRAVENOUS
  Administered 2018-08-01 (×2): 30 mg via INTRAVENOUS
  Administered 2018-08-01: 40 mg via INTRAVENOUS

## 2018-08-01 MED ORDER — SODIUM POLYSTYRENE SULFONATE 15 GM/60ML PO SUSP
15.0000 g | Freq: Once | ORAL | Status: DC
  Filled 2018-08-01: qty 60

## 2018-08-01 MED ORDER — LACTATED RINGERS IV SOLN
INTRAVENOUS | Status: DC | PRN
  Administered 2018-08-01: 14:00:00 via INTRAVENOUS

## 2018-08-01 MED ORDER — FUROSEMIDE 40 MG PO TABS
40.0000 mg | ORAL_TABLET | Freq: Two times a day (BID) | ORAL | Status: DC
  Administered 2018-08-01 – 2018-08-02 (×2): 40 mg via ORAL
  Filled 2018-08-01 (×2): qty 1

## 2018-08-01 NOTE — Anesthesia Postprocedure Evaluation (Signed)
Anesthesia Post Note  Patient: Lyndie Vanderloop Vanderbeck  Procedure(s) Performed: TRANSESOPHAGEAL ECHOCARDIOGRAM (TEE) (N/A )     Patient location during evaluation: PACU Anesthesia Type: MAC Level of consciousness: awake and alert Pain management: pain level controlled Vital Signs Assessment: post-procedure vital signs reviewed and stable Respiratory status: spontaneous breathing, nonlabored ventilation, respiratory function stable and patient connected to nasal cannula oxygen Cardiovascular status: stable and blood pressure returned to baseline Postop Assessment: no apparent nausea or vomiting Anesthetic complications: no    Last Vitals:  Vitals:   08/01/18 1505 08/01/18 1510  BP: (!) 127/44 (!) 132/43  Pulse: 79 81  Resp: 16 16  Temp:    SpO2: 93% 92%    Last Pain:  Vitals:   08/01/18 1510  TempSrc:   PainSc: 0-No pain                 Effie Berkshire

## 2018-08-01 NOTE — Progress Notes (Signed)
Patient presented to endoscopy for inpatient TEE, IV right forearm saline locked upon arrival, IV noted to be infiltrated. Infiltration measures 12cm long x 5cm wide.

## 2018-08-01 NOTE — Interval H&P Note (Signed)
History and Physical Interval Note:  08/01/2018 1:25 PM  Carla Foster  has presented today for surgery, with the diagnosis of LEFT ATRIAL MASS  The various methods of treatment have been discussed with the patient and family. After consideration of risks, benefits and other options for treatment, the patient has consented to  Procedure(s): TRANSESOPHAGEAL ECHOCARDIOGRAM (TEE) (N/A) as a surgical intervention .  The patient's history has been reviewed, patient examined, no change in status, stable for surgery.  I have reviewed the patient's chart and labs.  Questions were answered to the patient's satisfaction.     Skeet Latch, MD

## 2018-08-01 NOTE — Anesthesia Procedure Notes (Signed)
Central Venous Catheter Insertion Performed by: Effie Berkshire, MD, anesthesiologist Start/End1/13/2020 2:00 PM, 08/01/2018 2:10 PM Patient location: Pre-op. Preanesthetic checklist: patient identified, IV checked, site marked, risks and benefits discussed, surgical consent, monitors and equipment checked, pre-op evaluation, timeout performed and anesthesia consent Position: Trendelenburg Lidocaine 1% used for infiltration and patient sedated Hand hygiene performed , maximum sterile barriers used  and Seldinger technique used Catheter size: 8 Fr Total catheter length 16. Central line was placed.Double lumen Procedure performed using ultrasound guided technique. Ultrasound Notes:anatomy identified, needle tip was noted to be adjacent to the nerve/plexus identified, no ultrasound evidence of intravascular and/or intraneural injection and image(s) printed for medical record Attempts: 1 Following insertion, dressing applied, line sutured and Biopatch. Post procedure assessment: blood return through all ports  Patient tolerated the procedure well with no immediate complications.

## 2018-08-01 NOTE — Progress Notes (Signed)
PROGRESS NOTE    Carla Foster  NAT:[id number] DOB: 02-08-1941 DOA: 07/25/2018 PCP: Christain Sacramento, MD  Brief Narrative: 78 year old female with history of asthma, hypertension, diabetes mellitus, peptic ulcer disease, was admitted to the ICU on 1/16 with acute hypoxic respiratory failure secondary to pneumonia and CHF exacerbation, she was also noted to have a sodium of 110. -Improved with diuresis and antibiotics -As part of her work-up, echocardiogram revealed normal EF, grade 2 diastolic dysfunction and large mobile density on the aortic valve measuring 0.7 x 2.5 cm. -She was transferred from ICU/PCCM to [facility] service 1/10 -Improving on antibiotics and diuretics, TEE on 1/13  Assessment & Plan:   Acute hypoxic respiratory failure -Due to community-acquired pneumonia and CHF exacerbation -Improving with antibiotics and diuretics, blood cultures are negative, sputum cultures grew MRSA -Clinically appears euvolemic, transition to oral Lasix today -continue doxycycline for 5 days total  -Wean off O2 -Physical therapy evaluation, home health recommended  Acute on chronic diastolic CHF/severe pulmonary hypertension -Echo with preserved EF and grade 2 diastolic dysfunction -improving with diuresis, see above -Needs further work-up for pulmonary hypertension as well, TEE tomorrow will reassess PA pressures after diuresis  Large mobile density on aortic valve -No history suggestive of endocarditis, blood cultures are negative -Cardiology consult appreciated, plan for TEE on Monday  -stable and afebrile, leukocytosis is improving  Hyponatremia -Suspect this is multifactorial, secondary to volume overloaded state, HCTZ on admission -In addition could have had a component of SIADH from pneumonia -Improving with above management  Acute on chronic iron deficiency anemia -History of peptic ulcer disease, no overt blood loss -History of iron deficiency anemia on oral iron at baseline   -Hemoglobin has slowly trended down to 7.2 yesterday, given 1 unit of PRBC, hemoglobin is 8.8 now -will give IV iron today  Stage III chronic kidney disease -Stable, monitor -Losartan, HCTZ on hold  Diabetes mellitus -CBG is uncontrolled,  -check hemoglobin A1c, started on lantus, continue sliding scale -improving now  DVT prophylaxis: Heparin subcutaneous Code Status: Full code Family Communication: Discussed with daughters at bedside Disposition Plan: Home pending above work-up  Consultants:   PCCM  Cardiology   Procedures:   Antimicrobials:    Subjective: -Feels better, breathing is improving, close to baseline  Objective: Vitals:   07/31/18 2342 08/01/18 0600 08/01/18 0700 08/01/18 0754  BP: (!) 142/46   (!) 156/53  Pulse: 83   73  Resp: 16   18  Temp: 98 F (36.7 C)   98.1 F (36.7 C)  TempSrc: Oral   Oral  SpO2: 95%  98% 99%  Weight:  73.4 kg    Height:        Intake/Output Summary (Last 24 hours) at 08/01/2018 1225 Last data filed at 07/31/2018 2000 Gross per 24 hour  Intake 565 ml  Output 950 ml  Net -385 ml   Filed Weights   07/28/18 0600 07/29/18 0447 08/01/18 0600  Weight: 75.8 kg 78.1 kg 73.4 kg    Examination:  Gen: Awake, Alert, Oriented X 3, frail cachectic elderly female HEENT: PERRLA, Neck supple, no JVD Lungs: Improved air movement, decreased breath sounds at both bases CVS: RRR,No Gallops,Rubs or new Murmurs Abd: soft, Non tender, non distended, BS present Extremities: No edema Skin: no new rashes Psychiatry: Judgement and insight appear normal. Mood & affect appropriate.     Data Reviewed:   CBC: Recent Labs  Lab 07/25/18 2056  07/27/18 0220 07/28/18 0930 07/30/18 0247 07/31/18 0257 08/01/18 0309  WBC 14.1*   < > 21.4* 16.5* 20.0* 16.0* 16.0*  NEUTROABS 12.6*  --   --   --   --   --   --   HGB 7.4*   < > 7.4* 8.4* 7.6* 7.2* 8.8*  HCT 21.9*   < > 21.9* 24.9* 22.7* 22.1* 25.9*  MCV 78.2*   < > 77.7* 81.1 80.5  79.8* 79.7*  PLT 297   < > 337 425* 392 382 407*   < > = values in this interval not displayed.   Basic Metabolic Panel: Recent Labs  Lab 07/26/18 0145  07/28/18 2047 07/30/18 0247 07/31/18 0257 08/01/18 0309 08/01/18 0946  NA 110*   < > 124* 125* 126* 126* 130*  K 4.8   < > 5.1 5.1 5.0 5.4* 5.1  CL 76*   < > 92* 90* 89* 87* 89*  CO2 22   < > 25 27 28 31  32  GLUCOSE 281*   < > 239* 172* 208* 94 106*  BUN 23   < > 31* 33* 36* 41* 36*  CREATININE 1.13*   < > 1.13* 0.96 0.96 0.86 0.88  CALCIUM 8.7*   < > 8.3* 8.7* 8.7* 9.2 9.2  MG 1.5*  --   --   --   --   --   --   PHOS 3.2  --   --   --   --   --   --    < > = values in this interval not displayed.   GFR: Estimated Creatinine Clearance: 53.1 mL/min (by C-G formula based on SCr of 0.88 mg/dL). Liver Function Tests: Recent Labs  Lab 07/25/18 2056  AST 39  ALT 25  ALKPHOS 34*  BILITOT 1.3*  PROT 6.1*  ALBUMIN 2.8*   No results for input(s): LIPASE, AMYLASE in the last 168 hours. No results for input(s): AMMONIA in the last 168 hours. Coagulation Profile: Recent Labs  Lab 07/26/18 0010  INR 1.18   Cardiac Enzymes: No results for input(s): CKTOTAL, CKMB, CKMBINDEX, TROPONINI in the last 168 hours. BNP (last 3 results) No results for input(s): PROBNP in the last 8760 hours. HbA1C: Recent Labs    07/31/18 0814  HGBA1C 7.3*   CBG: Recent Labs  Lab 07/31/18 1149 07/31/18 1716 07/31/18 2147 08/01/18 0726 08/01/18 1146  GLUCAP 138* 98 209* 89 100*   Lipid Profile: No results for input(s): CHOL, HDL, LDLCALC, TRIG, CHOLHDL, LDLDIRECT in the last 72 hours. Thyroid Function Tests: No results for input(s): TSH, T4TOTAL, FREET4, T3FREE, THYROIDAB in the last 72 hours. Anemia Panel: Recent Labs    07/31/18 0814  VITAMINB12 1,000*  FOLATE 11.8  FERRITIN 130  TIBC 270  IRON 17*  RETICCTPCT 0.9   Urine analysis:    Component Value Date/Time   COLORURINE YELLOW 07/26/2018 0633   APPEARANCEUR HAZY (A)  07/26/2018 0633   LABSPEC 1.017 07/26/2018 0633   PHURINE 5.0 07/26/2018 0633   GLUCOSEU 150 (A) 07/26/2018 0633   HGBUR NEGATIVE 07/26/2018 0633   BILIRUBINUR NEGATIVE 07/26/2018 0633   KETONESUR 5 (A) 07/26/2018 0633   PROTEINUR 100 (A) 07/26/2018 0633   NITRITE NEGATIVE 07/26/2018 0633   LEUKOCYTESUR NEGATIVE 07/26/2018 0633   Sepsis Labs: @LABRCNTIP (procalcitonin:4,lacticidven:4)  ) Recent Results (from the past 240 hour(s))  Culture, blood (routine x 2)     Status: None   Collection Time: 07/26/18  1:45 AM  Result Value Ref Range Status   Specimen Description BLOOD RIGHT ANTECUBITAL  Final  Special Requests   Final    BOTTLES DRAWN AEROBIC AND ANAEROBIC Blood Culture adequate volume   Culture   Final    NO GROWTH 5 DAYS Performed at [facility], [place] [address] [place], [place] [address]    Report Status 07/31/2018 FINAL  Final  MRSA PCR Screening     Status: Abnormal   Collection Time: 07/26/18  2:23 AM  Result Value Ref Range Status   MRSA by PCR POSITIVE (A) NEGATIVE Final    Comment:        The GeneXpert MRSA Assay (FDA approved for NASAL specimens only), is one component of a comprehensive MRSA colonization surveillance program. It is not intended to diagnose MRSA infection nor to guide or monitor treatment for MRSA infections. RESULT CALLED TO, READ BACK BY AND VERIFIED WITH: Arville Go RN [id number] 07/26/18 A BROWNING Performed at [facility], [place] [address] [place], [place] [address]   Culture, blood (routine x 2)     Status: None   Collection Time: 07/26/18  3:04 AM  Result Value Ref Range Status   Specimen Description BLOOD RIGHT HAND  Final   Special Requests   Final    BOTTLES DRAWN AEROBIC ONLY Blood Culture results may not be optimal due to an excessive volume of blood received in culture bottles   Culture   Final    NO GROWTH 5 DAYS Performed at [facility], [place] [address], [place], [place] [address]    Report Status 07/31/2018  FINAL  Final  Urine culture     Status: None   Collection Time: 07/26/18  7:12 AM  Result Value Ref Range Status   Specimen Description URINE, RANDOM  Final   Special Requests NONE  Final   Culture   Final    NO GROWTH Performed at [facility], [place] [address], [place], [place] [address]    Report Status 07/27/2018 FINAL  Final  Respiratory Panel by PCR     Status: None   Collection Time: 07/26/18  7:13 AM  Result Value Ref Range Status   Adenovirus NOT DETECTED NOT DETECTED Final   Coronavirus 229E NOT DETECTED NOT DETECTED Final   Coronavirus HKU1 NOT DETECTED NOT DETECTED Final   Coronavirus NL63 NOT DETECTED NOT DETECTED Final   Coronavirus OC43 NOT DETECTED NOT DETECTED Final   Metapneumovirus NOT DETECTED NOT DETECTED Final   Rhinovirus / Enterovirus NOT DETECTED NOT DETECTED Final   Influenza A NOT DETECTED NOT DETECTED Final   Influenza B NOT DETECTED NOT DETECTED Final   Parainfluenza Virus 1 NOT DETECTED NOT DETECTED Final   Parainfluenza Virus 2 NOT DETECTED NOT DETECTED Final   Parainfluenza Virus 3 NOT DETECTED NOT DETECTED Final   Parainfluenza Virus 4 NOT DETECTED NOT DETECTED Final   Respiratory Syncytial Virus NOT DETECTED NOT DETECTED Final   Bordetella pertussis NOT DETECTED NOT DETECTED Final   Chlamydophila pneumoniae NOT DETECTED NOT DETECTED Final   Mycoplasma pneumoniae NOT DETECTED NOT DETECTED Final    Comment: Performed at [facility], [place] [address] [place], [place] [address]  Expectorated sputum assessment w rflx to resp cult     Status: None   Collection Time: 07/26/18  5:50 PM  Result Value Ref Range Status   Specimen Description EXPECTORATED SPUTUM  Final   Special Requests NONE  Final   Sputum evaluation   Final    THIS SPECIMEN IS ACCEPTABLE FOR SPUTUM CULTURE Performed at [facility], [place]  [address], [place], [place] [address]    Report Status 07/26/2018 FINAL  Final  Culture, respiratory     Status: None   Collection  Time: 07/26/18  5:50 PM  Result Value Ref Range Status   Specimen Description EXPECTORATED SPUTUM  Final   Special Requests NONE Reflexed from [id number]  Final   Gram Stain   Final    ABUNDANT WBC PRESENT, PREDOMINANTLY PMN ABUNDANT GRAM POSITIVE COCCI Performed at [facility], [place] [address], [place], [place] [address]    Culture   Final    ABUNDANT METHICILLIN RESISTANT STAPHYLOCOCCUS AUREUS   Report Status 07/29/2018 FINAL  Final   Organism ID, Bacteria METHICILLIN RESISTANT STAPHYLOCOCCUS AUREUS  Final      Susceptibility   Methicillin resistant staphylococcus aureus - MIC*    CIPROFLOXACIN >=8 RESISTANT Resistant     ERYTHROMYCIN >=8 RESISTANT Resistant     GENTAMICIN <=0.5 SENSITIVE Sensitive     OXACILLIN >=4 RESISTANT Resistant     TETRACYCLINE <=1 SENSITIVE Sensitive     VANCOMYCIN <=0.5 SENSITIVE Sensitive     TRIMETH/SULFA <=10 SENSITIVE Sensitive     CLINDAMYCIN <=0.25 SENSITIVE Sensitive     RIFAMPIN <=0.5 SENSITIVE Sensitive     Inducible Clindamycin NEGATIVE Sensitive     * ABUNDANT METHICILLIN RESISTANT STAPHYLOCOCCUS AUREUS         Radiology Studies: No results found.      Scheduled Meds: . amLODipine  5 mg Oral Daily  . budesonide (PULMICORT) nebulizer solution  0.5 mg Nebulization BID  . carvedilol  25 mg Oral BID WC  . doxazosin  4 mg Oral QHS  . doxycycline  100 mg Oral Q12H  . feeding supplement (ENSURE ENLIVE)  237 mL Oral BID BM  . ferrous sulfate  325 mg Oral QODAY  . furosemide  40 mg Oral BID  . heparin  5,000 Units Subcutaneous Q8H  . insulin aspart  0-15 Units Subcutaneous TID WC  . insulin glargine  20 Units Subcutaneous Daily  . ipratropium-albuterol  3 mL Nebulization TID  . mouth rinse  15 mL Mouth Rinse BID  . multivitamin with minerals  1 tablet Oral Daily  . pantoprazole  40 mg Oral QAC breakfast  . simvastatin  20 mg Oral QHS  . sodium polystyrene  15 g Oral Once   Continuous Infusions: . sodium chloride       LOS:  6 days    Time spent: 31min    Domenic Polite, MD [facility] Page via [email or web address], password [email or web address] After 7PM please contact night-coverage  08/01/2018, 12:25 PM

## 2018-08-01 NOTE — Progress Notes (Addendum)
Occupational Therapy Treatment Patient Details Name: Carla Foster MRN: [id number] DOB: 1941/01/19 Today's Date: 08/01/2018    History of present illness Pt is a 78 y.o. female admitted 07/25/18 with acute hypoxic respiratory failure secondary to PNA and CHF exacerbation; also with hyponatremia. Transfer out of ICU on 1/10. Pt with large mobile density on aortic valve; awaiting TEE. PMH includes HTN, CKD III, CHF, DM, asthma.   OT comments  Pt progressing towards OT goals, presents supine in bed pleasant and willing to participate in therapy session. Pt completing room level mobility using RW with overall minguard assist. Pt performing standing grooming ADL with minguard assist, toileting task with minA for gown management. Pt continues to require 2L supplemental O2 to maintain sats at 90% during standing activity and cued for deep breathing techniques throughout. Feel POC remains appropriate at this time. Will continue to follow acutely to progress pt towards established OT goals.   Follow Up Recommendations  Home health OT;Supervision/Assistance - 24 hour    Equipment Recommendations  3 in 1 bedside commode          Precautions / Restrictions Precautions Precautions: Fall Precaution Comments: Currently requiring 2L O2 [place] to maintain >90% Restrictions Weight Bearing Restrictions: No       Mobility Bed Mobility Overal bed mobility: Needs Assistance Bed Mobility: Supine to Sit;Sit to Supine     Supine to sit: Supervision Sit to supine: Supervision   General bed mobility comments: no assist required. increased time and effort, HOB elevated  Transfers Overall transfer level: Needs assistance Equipment used: Rolling walker (2 wheeled) Transfers: Sit to/from Stand Sit to Stand: Supervision         General transfer comment: S for safety, no physical assist given     Balance Overall balance assessment: Needs assistance Sitting-balance support: No upper extremity  supported;Feet supported Sitting balance-Leahy Scale: Good     Standing balance support: Bilateral upper extremity supported;During functional activity Standing balance-Leahy Scale: Fair Standing balance comment: able to static stand without UE support during grooming ADL                           ADL either performed or assessed with clinical judgement   ADL Overall ADL's : Needs assistance/impaired     Grooming: Wash/dry face;Min guard;Standing;Set up;Wash/dry hands;Sitting                   Toilet Transfer: Min guard;Ambulation;BSC;RW Toilet Transfer Details (indicate cue type and reason): BSC placed in room; pt performing room level mobility  Toileting- Clothing Manipulation and Hygiene: Sit to/from stand;Minimal assistance Toileting - Clothing Manipulation Details (indicate cue type and reason): assist for gown management, pt performing peri-care after voiding bladder and after small BM with minguard for safety/standing balance     Functional mobility during ADLs: Min guard;Rolling walker General ADL Comments: pt requiring 2L O2 to maintain sats at 90%, lowest sat noted 87% with room level activity, cues for deep breathing techniques     Vision       Perception     Praxis      Cognition Arousal/Alertness: Awake/alert Behavior During Therapy: WFL for tasks assessed/performed Overall Cognitive Status: Impaired/Different from baseline Area of Impairment: Problem solving;Safety/judgement                         Safety/Judgement: Decreased awareness of safety   Problem Solving: Slow processing;Requires verbal cues General Comments: slower processing  noted, though pt also HOH which most likely plays a factor        Exercises     Shoulder Instructions       General Comments      Pertinent Vitals/ Pain       Pain Assessment: No/denies pain  Home Living Family/patient expects to be discharged to:: Private residence                                         Prior Functioning/Environment              Frequency  Min 2X/week        Progress Toward Goals  OT Goals(current goals can now be found in the care plan section)  Progress towards OT goals: Progressing toward goals  Acute Rehab OT Goals Patient Stated Goal: Get back home, hopefully without having to wear oxygen OT Goal Formulation: With patient Time For Goal Achievement: 08/13/18 Potential to Achieve Goals: Good  Plan Discharge plan remains appropriate    Co-evaluation                 AM-PAC OT "6 Clicks" Daily Activity     Outcome Measure   Help from another person eating meals?: None Help from another person taking care of personal grooming?: A Little Help from another person toileting, which includes using toliet, bedpan, or urinal?: A Little Help from another person bathing (including washing, rinsing, drying)?: A Little Help from another person to put on and taking off regular upper body clothing?: None Help from another person to put on and taking off regular lower body clothing?: A Little 6 Click Score: 20    End of Session Equipment Utilized During Treatment: Gait belt;Rolling walker;Oxygen  OT Visit Diagnosis: Muscle weakness (generalized) (M62.81)   Activity Tolerance Patient tolerated treatment well   Patient Left in bed;with call bell/phone within reach;with family/visitor present   Nurse Communication Mobility status        Time: 3976-7341 OT Time Calculation (min): 29 min  Charges: OT General Charges $OT Visit: 1 Visit OT Treatments $Self Care/Home Management : 23-37 mins  Lou Cal, OT [facility] Pager [phone] Office [place] 08/01/2018, 1:05 PM

## 2018-08-01 NOTE — Progress Notes (Signed)
Progress Note  Patient Name: Carla Foster Date of Encounter: 08/01/2018  Primary Cardiologist: Mertie Moores, MD   Subjective   No chest pain. Dyspnea improved.   Inpatient Medications    Scheduled Meds: . amLODipine  5 mg Oral Daily  . budesonide (PULMICORT) nebulizer solution  0.5 mg Nebulization BID  . carvedilol  25 mg Oral BID WC  . doxazosin  4 mg Oral QHS  . doxycycline  100 mg Oral Q12H  . feeding supplement (ENSURE ENLIVE)  237 mL Oral BID BM  . ferrous sulfate  325 mg Oral QODAY  . furosemide  20 mg Intravenous Once  . heparin  5,000 Units Subcutaneous Q8H  . insulin aspart  0-15 Units Subcutaneous TID WC  . insulin glargine  20 Units Subcutaneous Daily  . ipratropium-albuterol  3 mL Nebulization TID  . mouth rinse  15 mL Mouth Rinse BID  . multivitamin with minerals  1 tablet Oral Daily  . pantoprazole  40 mg Oral QAC breakfast  . simvastatin  20 mg Oral QHS  . sodium polystyrene  15 g Oral Once   Continuous Infusions:  PRN Meds: acetaminophen, albuterol, guaiFENesin   Vital Signs    Vitals:   07/31/18 2342 08/01/18 0600 08/01/18 0700 08/01/18 0754  BP: (!) 142/46   (!) 156/53  Pulse: 83   73  Resp: 16   18  Temp: 98 F (36.7 C)   98.1 F (36.7 C)  TempSrc: Oral   Oral  SpO2: 95%  98% 99%  Weight:  73.4 kg    Height:        Intake/Output Summary (Last 24 hours) at 08/01/2018 0805 Last data filed at 07/31/2018 2000 Gross per 24 hour  Intake 565 ml  Output 950 ml  Net -385 ml   Last 3 Weights 08/01/2018 07/29/2018 07/28/2018  Weight (lbs) 161 lb 13.1 oz 172 lb 2.9 oz 167 lb 1.7 oz  Weight (kg) 73.4 kg 78.1 kg 75.8 kg      Telemetry    sinus - Personally Reviewed  ECG    No AM eKG- Personally Reviewed  Physical Exam   GEN: No acute distress.   Neck: No JVD Cardiac: RRR, no murmurs, rubs, or gallops.  Respiratory: Clear to auscultation bilaterally. GI: Soft, nontender, non-distended  MS: No edema; No deformity. Neuro:  Nonfocal   Psych: Normal affect   Labs    Chemistry Recent Labs  Lab 07/25/18 2056  07/30/18 0247 07/31/18 0257 08/01/18 0309  NA 110*   < > 125* 126* 126*  K 5.1   < > 5.1 5.0 5.4*  CL 78*   < > 90* 89* 87*  CO2 20*   < > 27 28 31   GLUCOSE 323*   < > 172* 208* 94  BUN 21   < > 33* 36* 41*  CREATININE 1.11*   < > 0.96 0.96 0.86  CALCIUM 8.3*   < > 8.7* 8.7* 9.2  PROT 6.1*  --   --   --   --   ALBUMIN 2.8*  --   --   --   --   AST 39  --   --   --   --   ALT 25  --   --   --   --   ALKPHOS 34*  --   --   --   --   BILITOT 1.3*  --   --   --   --  GFRNONAA 47*   < > 56* 56* >60  GFRAA 55*   < > >60 >60 >60  ANIONGAP 12   < > 8 9 8    < > = values in this interval not displayed.     Hematology Recent Labs  Lab 07/30/18 0247 07/31/18 0257 07/31/18 0814 08/01/18 0309  WBC 20.0* 16.0*  --  16.0*  RBC 2.82* 2.77* 2.72* 3.25*  HGB 7.6* 7.2*  --  8.8*  HCT 22.7* 22.1*  --  25.9*  MCV 80.5 79.8*  --  79.7*  MCH 27.0 26.0  --  27.1  MCHC 33.5 32.6  --  34.0  RDW 13.6 13.6  --  13.9  PLT 392 382  --  407*    Cardiac EnzymesNo results for input(s): TROPONINI in the last 168 hours.  Recent Labs  Lab 07/25/18 2103  TROPIPOC 0.01     BNP Recent Labs  Lab 07/25/18 2056  BNP 682.3*     DDimer No results for input(s): DDIMER in the last 168 hours.   Radiology    No results found.  Cardiac Studies   Echo 07/26/18: Left ventricle: The cavity size was normal. Systolic function was   normal. The estimated ejection fraction was in the range of 60%   to 65%. Wall motion was normal; there were no regional wall   motion abnormalities. Features are consistent with a pseudonormal   left ventricular filling pattern, with concomitant abnormal   relaxation and increased filling pressure (grade 2 diastolic   dysfunction). Doppler parameters are consistent with high   ventricular filling pressure. - Aortic valve: There is a mobile density on the LVOT side of the   aortic valve  measuring 0.715 x 2.35cm of unknown etiology.   Recommend TEE for further evaulation. Valve area (VTI): 1.9 cm^2.   Valve area (Vmax): 1.82 cm^2. Valve area (Vmean): 1.72 cm^2. - Mitral valve: Severely calcified annulus. There was mild   regurgitation. Valve area by continuity equation (using LVOT   flow): 1.44 cm^2. - Left atrium: The atrium was moderately to severely dilated. - Tricuspid valve: There was moderate-severe regurgitation. - Pulmonary arteries: PA peak pressure: 80 mm Hg (S).  Impressions:  - The right ventricular systolic pressure was increased consistent   with severe pulmonary hypertension.   Patient Profile     78 y.o. female with history of HTN, HLD, DM and GI bleeding admitted with pneumonia, diastolic CHF and hyponatremia. She has diuresed well. Mass on aortic valve on TTE.   Assessment & Plan    1. Acute respiratory failure secondary to CAP and diastolic CHF: Respiratory status much better. Would change to po Lasix today.   2. Aortic valve density: Plans for TEE today to better define.   For questions or updates, please contact [place] Please consult [email or web address] for contact info under        Signed, Lauree Chandler, MD  08/01/2018, 8:05 AM

## 2018-08-01 NOTE — Anesthesia Procedure Notes (Signed)
Procedure Name: MAC Date/Time: 08/01/2018 2:38 PM Performed by: Orlie Dakin, CRNA Pre-anesthesia Checklist: Patient identified, Emergency Drugs available, Suction available and Patient being monitored Patient Re-evaluated:Patient Re-evaluated prior to induction Oxygen Delivery Method: Nasal cannula Preoxygenation: Pre-oxygenation with 100% oxygen Induction Type: IV induction

## 2018-08-01 NOTE — Transfer of Care (Signed)
Immediate Anesthesia Transfer of Care Note  Patient: Carla Foster  Procedure(s) Performed: TRANSESOPHAGEAL ECHOCARDIOGRAM (TEE) (N/A )  Patient Location: Endoscopy Unit  Anesthesia Type:MAC  Level of Consciousness: drowsy  Airway & Oxygen Therapy: Patient Spontanous Breathing and Patient connected to nasal cannula oxygen  Post-op Assessment: Report given to RN and Post -op Vital signs reviewed and stable  Post vital signs: Reviewed and stable  Last Vitals:  Vitals Value Taken Time  BP    Temp    Pulse    Resp    SpO2      Last Pain:  Vitals:   08/01/18 1250  TempSrc: Oral  PainSc: 0-No pain      Patients Stated Pain Goal: 0 (42/35/36 1443)  Complications: No apparent anesthesia complications

## 2018-08-01 NOTE — CV Procedure (Signed)
Brief TEE Note  LVEF 60-65% No LA/LAA thrombus or mass Moderate TR Mild MR Mitral annular calcification and mild thickening of the anterior mitral valve leaflet No masses or vegetations noted on the aortic valve Previously noted lesion on the LVOT side of the aortic valve is due to rotation and partial visualization of the non-coronary cusp in the 3 chamber view.\  For additional details see full report.  Saia Derossett C. Oval Linsey, MD, [facility] 08/01/2018 2:51 PM

## 2018-08-01 NOTE — H&P (View-Only) (Signed)
Progress Note  Patient Name: Carla Foster Date of Encounter: 08/01/2018  Primary Cardiologist: Mertie Moores, MD   Subjective   No chest pain. Dyspnea improved.   Inpatient Medications    Scheduled Meds: . amLODipine  5 mg Oral Daily  . budesonide (PULMICORT) nebulizer solution  0.5 mg Nebulization BID  . carvedilol  25 mg Oral BID WC  . doxazosin  4 mg Oral QHS  . doxycycline  100 mg Oral Q12H  . feeding supplement (ENSURE ENLIVE)  237 mL Oral BID BM  . ferrous sulfate  325 mg Oral QODAY  . furosemide  20 mg Intravenous Once  . heparin  5,000 Units Subcutaneous Q8H  . insulin aspart  0-15 Units Subcutaneous TID WC  . insulin glargine  20 Units Subcutaneous Daily  . ipratropium-albuterol  3 mL Nebulization TID  . mouth rinse  15 mL Mouth Rinse BID  . multivitamin with minerals  1 tablet Oral Daily  . pantoprazole  40 mg Oral QAC breakfast  . simvastatin  20 mg Oral QHS  . sodium polystyrene  15 g Oral Once   Continuous Infusions:  PRN Meds: acetaminophen, albuterol, guaiFENesin   Vital Signs    Vitals:   07/31/18 2342 08/01/18 0600 08/01/18 0700 08/01/18 0754  BP: (!) 142/46   (!) 156/53  Pulse: 83   73  Resp: 16   18  Temp: 98 F (36.7 C)   98.1 F (36.7 C)  TempSrc: Oral   Oral  SpO2: 95%  98% 99%  Weight:  73.4 kg    Height:        Intake/Output Summary (Last 24 hours) at 08/01/2018 0805 Last data filed at 07/31/2018 2000 Gross per 24 hour  Intake 565 ml  Output 950 ml  Net -385 ml   Last 3 Weights 08/01/2018 07/29/2018 07/28/2018  Weight (lbs) 161 lb 13.1 oz 172 lb 2.9 oz 167 lb 1.7 oz  Weight (kg) 73.4 kg 78.1 kg 75.8 kg      Telemetry    sinus - Personally Reviewed  ECG    No AM eKG- Personally Reviewed  Physical Exam   GEN: No acute distress.   Neck: No JVD Cardiac: RRR, no murmurs, rubs, or gallops.  Respiratory: Clear to auscultation bilaterally. GI: Soft, nontender, non-distended  MS: No edema; No deformity. Neuro:  Nonfocal   Psych: Normal affect   Labs    Chemistry Recent Labs  Lab 07/25/18 2056  07/30/18 0247 07/31/18 0257 08/01/18 0309  NA 110*   < > 125* 126* 126*  K 5.1   < > 5.1 5.0 5.4*  CL 78*   < > 90* 89* 87*  CO2 20*   < > 27 28 31   GLUCOSE 323*   < > 172* 208* 94  BUN 21   < > 33* 36* 41*  CREATININE 1.11*   < > 0.96 0.96 0.86  CALCIUM 8.3*   < > 8.7* 8.7* 9.2  PROT 6.1*  --   --   --   --   ALBUMIN 2.8*  --   --   --   --   AST 39  --   --   --   --   ALT 25  --   --   --   --   ALKPHOS 34*  --   --   --   --   BILITOT 1.3*  --   --   --   --  GFRNONAA 47*   < > 56* 56* >60  GFRAA 55*   < > >60 >60 >60  ANIONGAP 12   < > 8 9 8    < > = values in this interval not displayed.     Hematology Recent Labs  Lab 07/30/18 0247 07/31/18 0257 07/31/18 0814 08/01/18 0309  WBC 20.0* 16.0*  --  16.0*  RBC 2.82* 2.77* 2.72* 3.25*  HGB 7.6* 7.2*  --  8.8*  HCT 22.7* 22.1*  --  25.9*  MCV 80.5 79.8*  --  79.7*  MCH 27.0 26.0  --  27.1  MCHC 33.5 32.6  --  34.0  RDW 13.6 13.6  --  13.9  PLT 392 382  --  407*    Cardiac EnzymesNo results for input(s): TROPONINI in the last 168 hours.  Recent Labs  Lab 07/25/18 2103  TROPIPOC 0.01     BNP Recent Labs  Lab 07/25/18 2056  BNP 682.3*     DDimer No results for input(s): DDIMER in the last 168 hours.   Radiology    No results found.  Cardiac Studies   Echo 07/26/18: Left ventricle: The cavity size was normal. Systolic function was   normal. The estimated ejection fraction was in the range of 60%   to 65%. Wall motion was normal; there were no regional wall   motion abnormalities. Features are consistent with a pseudonormal   left ventricular filling pattern, with concomitant abnormal   relaxation and increased filling pressure (grade 2 diastolic   dysfunction). Doppler parameters are consistent with high   ventricular filling pressure. - Aortic valve: There is a mobile density on the LVOT side of the   aortic valve  measuring 0.715 x 2.35cm of unknown etiology.   Recommend TEE for further evaulation. Valve area (VTI): 1.9 cm^2.   Valve area (Vmax): 1.82 cm^2. Valve area (Vmean): 1.72 cm^2. - Mitral valve: Severely calcified annulus. There was mild   regurgitation. Valve area by continuity equation (using LVOT   flow): 1.44 cm^2. - Left atrium: The atrium was moderately to severely dilated. - Tricuspid valve: There was moderate-severe regurgitation. - Pulmonary arteries: PA peak pressure: 80 mm Hg (S).  Impressions:  - The right ventricular systolic pressure was increased consistent   with severe pulmonary hypertension.   Patient Profile     78 y.o. female with history of HTN, HLD, DM and GI bleeding admitted with pneumonia, diastolic CHF and hyponatremia. She has diuresed well. Mass on aortic valve on TTE.   Assessment & Plan    1. Acute respiratory failure secondary to CAP and diastolic CHF: Respiratory status much better. Would change to po Lasix today.   2. Aortic valve density: Plans for TEE today to better define.   For questions or updates, please contact [place] Please consult [email or web address] for contact info under        Signed, Lauree Chandler, MD  08/01/2018, 8:05 AM

## 2018-08-01 NOTE — Anesthesia Preprocedure Evaluation (Addendum)
Anesthesia Evaluation  Patient identified by MRN, date of birth, ID band Patient awake    Reviewed: Allergy & Precautions, NPO status , Patient's Chart, lab work & pertinent test results  Airway Mallampati: I  TM Distance: >3 FB Neck ROM: Full    Dental  (+) Missing,    Pulmonary asthma , former smoker,    breath sounds clear to auscultation       Cardiovascular hypertension, Pt. on medications and Pt. on home beta blockers +CHF   Rhythm:Regular Rate:Normal     Neuro/Psych negative neurological ROS  negative psych ROS   GI/Hepatic Neg liver ROS, PUD,   Endo/Other  diabetes, Type 2, Oral Hypoglycemic Agents  Renal/GU   negative genitourinary   Musculoskeletal negative musculoskeletal ROS (+)   Abdominal Normal abdominal exam  (+)   Peds  Hematology   Anesthesia Other Findings   Reproductive/Obstetrics                            Anesthesia Physical Anesthesia Plan  ASA: II  Anesthesia Plan: MAC   Post-op Pain Management:    Induction: Intravenous  PONV Risk Score and Plan: 2 and Propofol infusion and Treatment may vary due to age or medical condition  Airway Management Planned: Natural Airway and Nasal Cannula  Additional Equipment: None  Intra-op Plan:   Post-operative Plan:   Informed Consent: I have reviewed the patients History and Physical, chart, labs and discussed the procedure including the risks, benefits and alternatives for the proposed anesthesia with the patient or authorized representative who has indicated his/her understanding and acceptance.     Plan Discussed with: CRNA  Anesthesia Plan Comments:         Anesthesia Quick Evaluation

## 2018-08-01 NOTE — Progress Notes (Signed)
Physical Therapy Treatment Patient Details Name: Carla Foster MRN: [id number] DOB: Mar 13, 1941 Today's Date: 08/01/2018    History of Present Illness Pt is a 78 y.o. female admitted 07/25/18 with acute hypoxic respiratory failure secondary to PNA and CHF exacerbation; also with hyponatremia. Transfer out of ICU on 1/10. Pt with large mobile density on aortic valve; awaiting TEE. PMH includes HTN, CKD III, CHF, DM, asthma.    PT Comments    Patient received in bed, pleasant and willing to work with therapy. Able to complete bed mobility with Mod(I), and required only S and RW for functional transfers and gait approximately 180f today but was quite fatigued at end of gait distance, no SOB on 2LPM O2 and SpO2 WNL on 2LPM O2. She was left sitting up at EOB with family present and providing direct supervision, all other needs met this morning.    Follow Up Recommendations  Home health PT;Supervision/Assistance - 24 hour     Equipment Recommendations  None recommended by PT    Recommendations for Other Services       Precautions / Restrictions Precautions Precautions: Fall Precaution Comments: Currently requiring 2L O2 [place] to maintain >90% Restrictions Weight Bearing Restrictions: No    Mobility  Bed Mobility Overal bed mobility: Modified Independent             General bed mobility comments: no assist required. increased time and effort  Transfers Overall transfer level: Needs assistance Equipment used: Rolling walker (2 wheeled) Transfers: Sit to/from Stand Sit to Stand: Supervision         General transfer comment: S for safety, no physical assist given   Ambulation/Gait Ambulation/Gait assistance: Supervision Gait Distance (Feet): 140 Feet Assistive device: Rolling walker (2 wheeled) Gait Pattern/deviations: Step-through pattern;Decreased stride length;Trunk flexed;Decreased step length - right;Decreased step length - left Gait velocity: Decreased   General  Gait Details: slow but steady with RW, improved use of RW today, easily fatigued. No SOB noted and SpO2 WNL on 2LPM    Stairs             Wheelchair Mobility    Modified Rankin (Stroke Patients Only)       Balance Overall balance assessment: Needs assistance Sitting-balance support: No upper extremity supported;Feet supported Sitting balance-Leahy Scale: Good     Standing balance support: Bilateral upper extremity supported;During functional activity Standing balance-Leahy Scale: Fair Standing balance comment: reliance on B UE support                             Cognition Arousal/Alertness: Awake/alert Behavior During Therapy: WFL for tasks assessed/performed Overall Cognitive Status: Impaired/Different from baseline Area of Impairment: Problem solving;Safety/judgement                         Safety/Judgement: Decreased awareness of safety   Problem Solving: Slow processing;Requires verbal cues General Comments: HOH      Exercises      General Comments        Pertinent Vitals/Pain Pain Assessment: No/denies pain    Home Living Family/patient expects to be discharged to:: Private residence                    Prior Function            PT Goals (current goals can now be found in the care plan section) Acute Rehab PT Goals Patient Stated Goal: Get back home, hopefully  without having to wear oxygen PT Goal Formulation: With patient/family Time For Goal Achievement: 08/12/18 Potential to Achieve Goals: Good Progress towards PT goals: Progressing toward goals    Frequency    Min 3X/week      PT Plan Current plan remains appropriate    Co-evaluation              AM-PAC PT "6 Clicks" Mobility   Outcome Measure  Help needed turning from your back to your side while in a flat bed without using bedrails?: None Help needed moving from lying on your back to sitting on the side of a flat bed without using  bedrails?: None Help needed moving to and from a bed to a chair (including a wheelchair)?: None Help needed standing up from a chair using your arms (e.g., wheelchair or bedside chair)?: A Little Help needed to walk in hospital room?: A Little Help needed climbing 3-5 steps with a railing? : A Little 6 Click Score: 21    End of Session Equipment Utilized During Treatment: Oxygen Activity Tolerance: Patient tolerated treatment well Patient left: in bed;with call bell/phone within reach;with family/visitor present(sitting at EOB per her request ) Nurse Communication: Mobility status PT Visit Diagnosis: Other abnormalities of gait and mobility (R26.89);Muscle weakness (generalized) (M62.81)     Time: 1007-1020 PT Time Calculation (min) (ACUTE ONLY): 13 min  Charges:  $Gait Training: 8-22 mins                     Deniece Ree PT, DPT, CBIS  Supplemental Physical Therapist [place]    Pager [phone] Acute Rehab Office [phone]

## 2018-08-01 NOTE — Care Management Important Message (Signed)
Important Message  Patient Details  Name: Carla Foster MRN: [id number] Date of Birth: 16-Oct-1940   Medicare Important Message Given:  Yes    Zenon Mayo, RN 08/01/2018, 3:52 PM

## 2018-08-02 ENCOUNTER — Encounter (HOSPITAL_COMMUNITY): Payer: Self-pay | Admitting: Cardiovascular Disease

## 2018-08-02 LAB — CBC
HCT: 26.7 % — ABNORMAL LOW (ref 36.0–46.0)
Hemoglobin: 8.9 g/dL — ABNORMAL LOW (ref 12.0–15.0)
MCH: 27.1 pg (ref 26.0–34.0)
MCHC: 33.3 g/dL (ref 30.0–36.0)
MCV: 81.2 fL (ref 80.0–100.0)
Platelets: 386 10*3/uL (ref 150–400)
RBC: 3.29 MIL/uL — ABNORMAL LOW (ref 3.87–5.11)
RDW: 13.9 % (ref 11.5–15.5)
WBC: 12.5 10*3/uL — ABNORMAL HIGH (ref 4.0–10.5)
nRBC: 0 % (ref 0.0–0.2)

## 2018-08-02 LAB — BASIC METABOLIC PANEL
Anion gap: 9 (ref 5–15)
BUN: 34 mg/dL — ABNORMAL HIGH (ref 8–23)
CO2: 33 mmol/L — ABNORMAL HIGH (ref 22–32)
Calcium: 9.4 mg/dL (ref 8.9–10.3)
Chloride: 90 mmol/L — ABNORMAL LOW (ref 98–111)
Creatinine, Ser: 0.83 mg/dL (ref 0.44–1.00)
GFR calc Af Amer: >60 mL/min (ref >60)
GFR calc non Af Amer: >60 mL/min (ref >60)
GLUCOSE: 65 mg/dL — AB (ref 70–99)
Potassium: 5.1 mmol/L (ref 3.5–5.1)
Sodium: 132 mmol/L — ABNORMAL LOW (ref 135–145)

## 2018-08-02 LAB — GLUCOSE, CAPILLARY
Glucose-Capillary: 73 mg/dL (ref 70–99)
Glucose-Capillary: 204 mg/dL — ABNORMAL HIGH (ref 70–99)
Glucose-Capillary: 201 mg/dL — ABNORMAL HIGH (ref 70–99)

## 2018-08-02 MED ORDER — DOXYCYCLINE HYCLATE 100 MG PO TABS: 100.0000 mg | ORAL_TABLET | Freq: Two times a day (BID) | ORAL | 0 refills | Status: AC

## 2018-08-02 MED ORDER — FUROSEMIDE 40 MG PO TABS: 40.0000 mg | ORAL_TABLET | Freq: Every day | ORAL | 0 refills | Status: DC

## 2018-08-02 MED ORDER — FUROSEMIDE 40 MG PO TABS: 40.0000 mg | ORAL_TABLET | Freq: Every day | ORAL | Status: DC

## 2018-08-02 MED ORDER — FUROSEMIDE 40 MG PO TABS: 40.0000 mg | ORAL_TABLET | Freq: Two times a day (BID) | ORAL | 0 refills | Status: DC

## 2018-08-02 NOTE — Progress Notes (Signed)
Progress Note  Patient Name: Carla Foster Date of Encounter: 08/02/2018  Primary Cardiologist: Mertie Moores, MD   Subjective   Patient feeling much better today.  Breathing improved.  TEE has been performed with findings outlined below.  Inpatient Medications    Scheduled Meds: . amLODipine  5 mg Oral Daily  . budesonide (PULMICORT) nebulizer solution  0.5 mg Nebulization BID  . carvedilol  25 mg Oral BID WC  . doxazosin  4 mg Oral QHS  . doxycycline  100 mg Oral Q12H  . feeding supplement (ENSURE ENLIVE)  237 mL Oral BID BM  . ferrous sulfate  325 mg Oral QODAY  . furosemide  40 mg Oral BID  . heparin  5,000 Units Subcutaneous Q8H  . insulin aspart  0-15 Units Subcutaneous TID WC  . insulin glargine  20 Units Subcutaneous Daily  . ipratropium-albuterol  3 mL Nebulization TID  . mouth rinse  15 mL Mouth Rinse BID  . multivitamin with minerals  1 tablet Oral Daily  . pantoprazole  40 mg Oral QAC breakfast  . simvastatin  20 mg Oral QHS  . sodium polystyrene  15 g Oral Once   Continuous Infusions:  PRN Meds: acetaminophen, albuterol, guaiFENesin   Vital Signs    Vitals:   08/02/18 0002 08/02/18 0500 08/02/18 0725 08/02/18 0730  BP: (!) 148/55     Pulse: 78  74   Resp:   16   Temp: 97.6 F (36.4 C)     TempSrc: Oral     SpO2: 92%  93% 93%  Weight:  72.9 kg    Height:        Intake/Output Summary (Last 24 hours) at 08/02/2018 1036 Last data filed at 08/02/2018 0900 Gross per 24 hour  Intake 1040 ml  Output 1350 ml  Net -310 ml   Last 3 Weights 08/02/2018 08/01/2018 07/29/2018  Weight (lbs) 160 lb 11.5 oz 161 lb 13.1 oz 172 lb 2.9 oz  Weight (kg) 72.9 kg 73.4 kg 78.1 kg       Physical Exam  Alert, oriented, elderly woman in no distress GEN: No acute distress.   Neck: No JVD Cardiac: RRR, 2/6 mid peaking systolic murmur at the right upper sternal border, no diastolic murmur Respiratory: Clear to auscultation bilaterally. GI: Soft, nontender,  non-distended  MS: No edema; No deformity. Neuro:  Nonfocal  Psych: Normal affect   Labs    Chemistry Recent Labs  Lab 08/01/18 0309 08/01/18 0946 08/01/18 1956  NA 126* 130* 127*  K 5.4* 5.1 4.5  CL 87* 89* 87*  CO2 31 32 32  GLUCOSE 94 106* 174*  BUN 41* 36* 33*  CREATININE 0.86 0.88 0.87  CALCIUM 9.2 9.2 8.9  GFRNONAA >60 >60 >60  GFRAA >60 >60 >60  ANIONGAP 8 9 8      Hematology Recent Labs  Lab 07/30/18 0247 07/31/18 0257 07/31/18 0814 08/01/18 0309  WBC 20.0* 16.0*  --  16.0*  RBC 2.82* 2.77* 2.72* 3.25*  HGB 7.6* 7.2*  --  8.8*  HCT 22.7* 22.1*  --  25.9*  MCV 80.5 79.8*  --  79.7*  MCH 27.0 26.0  --  27.1  MCHC 33.5 32.6  --  34.0  RDW 13.6 13.6  --  13.9  PLT 392 382  --  407*    Cardiac EnzymesNo results for input(s): TROPONINI in the last 168 hours. No results for input(s): TROPIPOC in the last 168 hours.   BNPNo results for  input(s): BNP, PROBNP in the last 168 hours.   DDimer No results for input(s): DDIMER in the last 168 hours.   Radiology    Dg Chest Port 1 View  Result Date: 08/01/2018 CLINICAL DATA:  Central venous catheter insertion. Pulmonary infiltrates. Effusions. EXAM: PORTABLE CHEST 1 VIEW COMPARISON:  Chest x-rays dated 07/28/2018 and 07/27/2018 and 07/25/2018 FINDINGS: Right-sided central line has been inserted. The tip is in the superior vena cava at the level of the azygos vein. No pneumothorax. There has been partial clearing of the right upper lobe infiltrate. Improved aeration at both lung bases. Small bilateral pleural effusions persist. Heart size and vascularity are normal. Aortic atherosclerosis. No acute bone abnormality. IMPRESSION: 1. Central line appears in good position with no pneumothorax. 2. Interval improvement in bilateral infiltrates. 3. Persistent small bilateral effusions. Electronically Signed   By: Lorriane Shire M.D.   On: 08/01/2018 15:58    Cardiac Studies   TEE: Study Conclusions  - Left ventricle:  Systolic function was normal. The estimated   ejection fraction was in the range of 55% to 60%. Wall motion was   normal; there were no regional wall motion abnormalities. - Aortic valve: No evidence of vegetation. There was trivial   regurgitation. - Mitral valve: Mildly calcified annulus. Mildly thickened leaflets   . There was mild regurgitation. - Left atrium: No evidence of thrombus in the atrial cavity or   appendage. No evidence of thrombus in the atrial cavity or   appendage. - Right atrium: No evidence of thrombus in the atrial cavity or   appendage. - Atrial septum: No defect or patent foramen ovale was identified. - Tricuspid valve: There was moderate regurgitation.  Impressions:  - The previously noted lesion on the LVOT surface of the aortic   valve was not present. The heart is somewhat rotated and at times   there is shadowing in this region seen on 3 chamber view that   likely represents the non-coronary cusp. There was no evidence of   endocarditis.  Patient Profile     78 y.o. female presenting with community-acquired pneumonia complicated by acute on chronic diastolic heart failure and questionable mass on her aortic valve  Assessment & Plan    1.  Acute respiratory failure, multifactorial with community-acquired pneumonia and acute on chronic diastolic heart failure: Patient improved significantly with diuresis.  Her home medical program is reviewed and she was previously on hydrochlorothiazide.  Would recommend hospital discharge on furosemide 40 mg daily.  Otherwise would continue her current medications.  Will arrange cardiology follow-up with Dr Acie Fredrickson or his APP.  2.  Aortic valve density: TEE performed today demonstrated no evidence of aortic valve mass.  Continue with clinical follow-up.  Disposition: Patient to be discharged today from the hospitalist service.  We will arrange cardiology follow-up as an outpatient.  [facility] will sign off.    Medication Recommendations:  Furosemide 40 mg PO daily Other recommendations (labs, testing, etc):  BMET one week Follow up as an outpatient:  Will arrange with Dr Acie Fredrickson or his APP  For questions or updates, please contact [facility] Please consult [email or web address] for contact info under     Signed, Sherren Mocha, MD  08/02/2018, 10:36 AM

## 2018-08-02 NOTE — Progress Notes (Addendum)
NCM still awaiting ambulatory sats to be done  [place], BSN- notified, [facility] orders in and ambulatory sats in .

## 2018-08-02 NOTE — Progress Notes (Addendum)
SATURATION QUALIFICATIONS: (This note is used to comply with regulatory documentation for home oxygen)  Patient Saturations on Room Air at Rest = 88%  Patient Saturations on Room Air while Ambulating = 80%  Patient Saturations on 2 Liters of oxygen while Ambulating= 93%  Please briefly explain why patient needs home oxygen: Oxygen low while ambulating.

## 2018-08-14 NOTE — Discharge Summary (Signed)
Physician Discharge Summary  Carla Foster FBP:[id number] DOB: July 11, 1941 DOA: 07/25/2018  PCP: Christain Sacramento, MD  Admit date: 07/25/2018 Discharge date: 08/02/2018  Time spent: 35 minutes  Recommendations for Outpatient Follow-up:  1. PCP in 1 week 2. Cardiology Dr. Acie Fredrickson in 2 weeks, follow-up echo to reassess PA pressures down the road   Discharge Diagnoses:  Acute hypoxic respiratory failure Community-acquired pneumonia Acute on chronic diastolic CHF Pulmonary hypertension   Hyponatremia   Type 2 diabetes mellitus ([place])   Malignant hypertension   Pneumonia   Community acquired pneumonia of right lower lobe of lung ([place])   Acute on chronic diastolic CHF (congestive heart failure) ([place])   Discharge Condition: Stable  Diet recommendation: Low-sodium, heart healthy  Filed Weights   07/29/18 0447 08/01/18 0600 08/02/18 0500  Weight: 78.1 kg 73.4 kg 72.9 kg    History of present illness:  78 year old female with history of asthma, hypertension, diabetes mellitus, peptic ulcer disease, was admitted to the ICU on 1/16 with acute hypoxic respiratory failure secondary to pneumonia and CHF exacerbation, she was also noted to have a sodium of 110.  Hospital Course:  Acute hypoxic respiratory failure -Due to community-acquired pneumonia and CHF exacerbation -Improving with antibiotics and diuretics, blood cultures are negative, sputum cultures grew MRSA -Clinically appears euvolemic, transitioned to oral Lasix at discharge -completed 7day course of Abx -Weaned off O2 -Physical therapy evaluation, home health recommended and set up  Acute on chronic diastolic CHF/severe pulmonary hypertension -Echo with preserved EF and grade 2 diastolic dysfunction -improving with diuresis, see above -Needs further work-up for pulmonary hypertension, PA pressure was 80 mmHg on admission echocardiogram however this was set in the setting of volume overload and respiratory failure -Needs repeat  echo down the road to reassess PA pressures -Also her admission echo noted a questionable mobile density on the aortic valve she did not have any signs or symptoms of endocarditis, underwent a TEE yesterday which did not show thrombus mass or any abnormality on the aortic valve -discharged home on Lasix 3 mg daily, advised close follow-up with cardiology Dr. Katharina Caper in 1 to 2 weeks  Hyponatremia -Suspect this is multifactorial, secondary to volume overloaded state, HCTZ on admission -In addition could have had a component of SIADH from pneumonia -Improving with above management -HCTZ discontinued  Acute on chronic iron deficiency anemia -History of peptic ulcer disease, no overt blood loss -History of iron deficiency anemia on oral iron at baseline  -Hemoglobin has slowly trended down to 7.2 this admission, given 1 unit of PRBC, hemoglobin is 8.8 now, also given IV iron  -continue oral Iron at DC  Stage III chronic kidney disease -Stable, monitor -Losartan, HCTZ stopped  Diabetes mellitus -CBG is uncontrolled,  -SSI used inpatient, hba1c was 7.3, FU with PCP to address this   Discharge Exam: Vitals:   08/02/18 1146 08/02/18 1327  BP: (!) 147/57   Pulse: 74 77  Resp: 18 18  Temp:    SpO2: (!) 88% 92%    General: AAOx3 Cardiovascular: S1S2/RRR Respiratory: CTAB  Discharge Instructions   Discharge Instructions    Diet - low sodium heart healthy   Complete by:  As directed    Diet Carb Modified   Complete by:  As directed    Increase activity slowly   Complete by:  As directed      Allergies as of 08/02/2018      Reactions   Tiazac [diltiazem Hcl]    Ankle edema  Medication List    STOP taking these medications   hydrochlorothiazide 25 MG tablet Commonly known as:  HYDRODIURIL   HYDROcodone-homatropine 5-1.5 MG/5ML syrup Commonly known as:  HYCODAN   ibuprofen 600 MG tablet Commonly known as:  ADVIL,MOTRIN   losartan 100 MG tablet Commonly  known as:  COZAAR     TAKE these medications   albuterol 108 (90 Base) MCG/ACT inhaler Commonly known as:  PROVENTIL HFA;VENTOLIN HFA Inhale 2 puffs into the lungs every 6 (six) hours as needed for wheezing or shortness of breath.   amLODipine 5 MG tablet Commonly known as:  NORVASC Take 1 tablet (5 mg total) by mouth daily. What changed:  how much to take   benzonatate 100 MG capsule Commonly known as:  TESSALON Take 1 capsule (100 mg total) by mouth 2 (two) times daily as needed for cough.   CALCIUM 600 + D PO Take 1 tablet by mouth daily.   calcium carbonate 500 MG chewable tablet Commonly known as:  TUMS - dosed in mg elemental calcium Chew 1-2 tablets by mouth daily as needed for indigestion or heartburn.   carvedilol 25 MG tablet Commonly known as:  COREG TAKE 1 TABLET BY MOUTH TWICE A DAY What changed:  when to take this   cholecalciferol 1000 units tablet Commonly known as:  VITAMIN D Take 1,000 Units by mouth daily.   doxazosin 4 MG tablet Commonly known as:  CARDURA Take 1 tablet (4 mg total) by mouth at bedtime.   fenofibrate 160 MG tablet TAKE 1 TABLET BY MOUTH EVERY DAY   ferrous sulfate 325 (65 FE) MG tablet Take 325 mg by mouth every other day.   fish oil-omega-3 fatty acids 1000 MG capsule Take 1 g by mouth daily.   FLAX SEED OIL PO Take 1 capsule by mouth daily.   furosemide 40 MG tablet Commonly known as:  LASIX Take 1 tablet (40 mg total) by mouth daily.   metFORMIN 1000 MG tablet Commonly known as:  GLUCOPHAGE Take 1,000 mg by mouth daily.   pantoprazole 40 MG tablet Commonly known as:  PROTONIX Take 1 tablet (40 mg total) by mouth daily before breakfast.   simvastatin 20 MG tablet Commonly known as:  ZOCOR Take 1 tablet (20 mg total) by mouth at bedtime.   SYSTANE BALANCE 0.6 % Soln Generic drug:  Propylene Glycol Apply 1 drop to eye as needed (dry eyes).   vitamin A 8000 UNIT capsule Take 8,000 Units by mouth daily.    vitamin C 500 MG tablet Commonly known as:  ASCORBIC ACID Take 500 mg by mouth daily.     ASK your doctor about these medications   doxycycline 100 MG tablet Commonly known as:  VIBRA-TABS Take 1 tablet (100 mg total) by mouth every 12 (twelve) hours for 2 days. Ask about: Should I take this medication?      Allergies  Allergen Reactions  . Tiazac [Diltiazem Hcl]     Ankle edema   Follow-up Information    Christain Sacramento, MD Follow up in 1 week(s).   Specialty:  Family Medicine Why:  with labs-[facility] Contact information: [address] [place] [address] [place] [address] [phone]        Nahser, Wonda Cheng, MD. Schedule an appointment as soon as possible for a visit in 2 week(s).   Specialty:  Cardiology Contact information: [place] [address] [phone]        [facility] [place]  Follow up.   Specialty:  Home Health Services Why:  [place], [facility], [facility] Contact information: [address] [place] [address] [phone]            The results of significant diagnostics from this hospitalization (including imaging, microbiology, ancillary and laboratory) are listed below for reference.    Significant Diagnostic Studies: Dg Chest Port 1 View  Result Date: 08/01/2018 CLINICAL DATA:  Central venous catheter insertion. Pulmonary infiltrates. Effusions. EXAM: PORTABLE CHEST 1 VIEW COMPARISON:  Chest x-rays dated 07/28/2018 and 07/27/2018 and 07/25/2018 FINDINGS: Right-sided central line has been inserted. The tip is in the superior vena cava at the level of the azygos vein. No pneumothorax. There has been partial clearing of the right upper lobe infiltrate. Improved aeration at both lung bases. Small bilateral pleural effusions persist. Heart size and vascularity are normal. Aortic atherosclerosis. No acute bone abnormality. IMPRESSION: 1. Central line appears in good position with no pneumothorax. 2. Interval improvement in  bilateral infiltrates. 3. Persistent small bilateral effusions. Electronically Signed   By: Lorriane Shire M.D.   On: 08/01/2018 15:58   Dg Chest Port 1 View  Result Date: 07/28/2018 CLINICAL DATA:  Pneumonia. EXAM: PORTABLE CHEST 1 VIEW COMPARISON:  07/27/2018. FINDINGS: Cardiomegaly. Persistent prominent right upper lobe and bibasilar infiltrates/edema and atelectasis noted. Interim slight improvement. Small bilateral pleural effusions. No pneumothorax stable cardiomegaly. No acute bony abnormality. Prior cervicothoracic fusion. IMPRESSION: 1. Persistent prominent right upper lobe and bibasilar infiltrates/edema and atelectasis again noted. Slight improvement from prior exam. Small bilateral pleural effusions. 2.  Stable cardiomegaly. Electronically Signed   By: Marcello Moores  Register   On: 07/28/2018 05:58   Dg Chest Port 1 View  Result Date: 07/27/2018 CLINICAL DATA:  Followup lung disease/pneumonia EXAM: PORTABLE CHEST 1 VIEW COMPARISON:  07/25/2018 FINDINGS: Artifact overlies the chest. Bilateral pulmonary infiltrates have worsened, particularly on the right, with more extensive involvement of the right lung. Small amount of pleural fluid on each side. No qualitatively new finding. IMPRESSION: Worsening of bilateral pneumonia, particularly on the right. Electronically Signed   By: Nelson Chimes M.D.   On: 07/27/2018 06:37   Dg Chest Port 1 View  Result Date: 07/25/2018 CLINICAL DATA:  Wheezing EXAM: PORTABLE CHEST 1 VIEW COMPARISON:  12/19/2014 FINDINGS: Small bilateral pleural effusions with basilar airspace disease. Cardiomegaly with vascular congestion and underlying pulmonary edema. Aortic atherosclerosis. No pneumothorax. Postsurgical changes in the lower cervical spine. IMPRESSION: 1. Small bilateral pleural effusions with cardiomegaly, vascular congestion and pulmonary edema 2. More confluent airspace disease at both bases, may reflect superimposed atelectasis or pneumonia Electronically Signed   By:  Donavan Foil M.D.   On: 07/25/2018 21:09    Microbiology: No results found for this or any previous visit (from the past 240 hour(s)).   Labs: Basic Metabolic Panel: No results for input(s): NA, K, CL, CO2, GLUCOSE, BUN, CREATININE, CALCIUM, MG, PHOS in the last 168 hours. Liver Function Tests: No results for input(s): AST, ALT, ALKPHOS, BILITOT, PROT, ALBUMIN in the last 168 hours. No results for input(s): LIPASE, AMYLASE in the last 168 hours. No results for input(s): AMMONIA in the last 168 hours. CBC: No results for input(s): WBC, NEUTROABS, HGB, HCT, MCV, PLT in the last 168 hours. Cardiac Enzymes: No results for input(s): CKTOTAL, CKMB, CKMBINDEX, TROPONINI in the last 168 hours. BNP: BNP (last 3 results) Recent Labs    07/25/18 2056  BNP 682.3*    ProBNP (last 3 results) No results for input(s): PROBNP in the  last 8760 hours.  CBG: No results for input(s): GLUCAP in the last 168 hours.     Signed:  Domenic Polite MD.  [facility] 08/14/2018, 3:56 PM

## 2018-08-18 ENCOUNTER — Ambulatory Visit: Payer: Medicare Other | Admitting: Physician Assistant

## 2018-08-18 ENCOUNTER — Encounter: Payer: Self-pay | Admitting: Physician Assistant

## 2018-08-18 VITALS — BP 126/62 | HR 78 | Ht 66.0 in | Wt 147.4 lb

## 2018-08-18 DIAGNOSIS — E782 Mixed hyperlipidemia: Secondary | ICD-10-CM

## 2018-08-18 DIAGNOSIS — I10 Essential (primary) hypertension: Secondary | ICD-10-CM

## 2018-08-18 DIAGNOSIS — I5033 Acute on chronic diastolic (congestive) heart failure: Secondary | ICD-10-CM | POA: Diagnosis not present

## 2018-08-18 NOTE — Patient Instructions (Signed)
Medication Instructions:  Your physician recommends that you continue on your current medications as directed. Please refer to the Current Medication list given to you today.  If you need a refill on your cardiac medications before your next appointment, please call your pharmacy.   Lab work: None ordered  If you have labs (blood work) drawn today and your tests are completely normal, you will receive your results only by: Marland Kitchen MyChart Message (if you have MyChart) OR . A paper copy in the mail If you have any lab test that is abnormal or we need to change your treatment, we will call you to review the results.  Testing/Procedures: None ordered  Follow-Up: Your physician recommends that you schedule a follow-up appointment in: 3-4 MONTHS WITH DR. Acie Fredrickson   Any Other Special Instructions Will Be Listed Below (If Applicable).

## 2018-08-18 NOTE — Progress Notes (Signed)
Cardiology Office Note    Date:  08/18/2018   ID:  Carla Foster, DOB 1941/07/09, MRN [id number]  PCP:  Carla Arnt, MD  Cardiologist:  Carla Moores, MD   Chief Complaint: Hospital follow up  History of Present Illness:   Carla Foster is a 78 y.o. female with a hx of HTN, HLD, T2DM, asthma and h/o GIB presents for hospital follow up.   Admitted 07/2018 with community-acquired pneumonia complicated by acute on chronic diastolic heart failure and questionable mass on her aortic valve. Patient improved significantly with diuresis. An echocardiogram was performed which demonstrated a large mobile density on the aortic valve. TEE demonstrated no evidence of aortic valve mass.   Here today follow up. Weight trending down (discharge 160>>147 today). Reviewed lab work from PCP 08/10/18. Advised to avoid banana's. She eats food high in salt. No dyspnea, orthopnea, PND, chest pain, dizziness, palpitations or melena. Mild LE edema. Compliant with medications.   Past Medical History:  Diagnosis Date  . Acute blood loss anemia   . AKI (acute kidney injury) ([place])   . Asthma   . Complication of anesthesia    nausea  . Diabetes mellitus    type 2  . GI bleed   . Hematemesis   . Hyperlipidemia   . Hypertension   . Melena     Past Surgical History:  Procedure Laterality Date  . CERVICAL DISCECTOMY  08/23/00   C5-6, C6-7 anterior cervical diskectomy with fibular bone bank fusion followed by [facility] anterior cervical plating with the operating microscope  --  SURGEON:  Carla Foster, M.D.  . ESOPHAGOGASTRODUODENOSCOPY (EGD) WITH PROPOFOL N/A 11/21/2017   Procedure: ESOPHAGOGASTRODUODENOSCOPY (EGD) WITH PROPOFOL;  Surgeon: Carla Mayer, MD;  Location: [facility];  Service: Endoscopy;  Laterality: N/A;  . PARTIAL HYSTERECTOMY    . TEE WITHOUT CARDIOVERSION N/A 08/01/2018   Procedure: TRANSESOPHAGEAL ECHOCARDIOGRAM (TEE);  Surgeon: Carla Latch, MD;  Location: [facility] ENDOSCOPY;   Service: Cardiovascular;  Laterality: N/A;    Current Medications: Prior to Admission medications   Medication Sig Start Date End Date Taking? Authorizing Provider  albuterol (PROVENTIL HFA;VENTOLIN HFA) 108 (90 Base) MCG/ACT inhaler Inhale 2 puffs into the lungs every 6 (six) hours as needed for wheezing or shortness of breath.  01/28/17   [provider]  amLODipine (NORVASC) 5 MG tablet Take 1 tablet (5 mg total) by mouth daily. Patient taking differently: Take 10 mg by mouth daily.  09/29/12   Nahser, Wonda Cheng, MD  benzonatate (TESSALON) 100 MG capsule Take 1 capsule (100 mg total) by mouth 2 (two) times daily as needed for cough. Patient not taking: Reported on 07/26/2018 11/23/17   Oretha Milch D, MD  calcium carbonate (TUMS - DOSED IN MG ELEMENTAL CALCIUM) 500 MG chewable tablet Chew 1-2 tablets by mouth daily as needed for indigestion or heartburn.     [provider]  Calcium Carbonate-Vitamin D (CALCIUM 600 + D PO) Take 1 tablet by mouth daily.      [provider]  carvedilol (COREG) 25 MG tablet TAKE 1 TABLET BY MOUTH TWICE A DAY Patient taking differently: Take 25 mg by mouth 2 (two) times daily with a meal.  07/11/18   Nahser, Wonda Cheng, MD  cholecalciferol (VITAMIN D) 1000 UNITS tablet Take 1,000 Units by mouth daily.      [provider]  doxazosin (CARDURA) 4 MG tablet Take 1 tablet (4 mg total) by mouth at bedtime. 02/03/18   Nahser,  Wonda Cheng, MD  fenofibrate 160 MG tablet TAKE 1 TABLET BY MOUTH EVERY DAY 12/10/14   Nahser, Wonda Cheng, MD  ferrous sulfate 325 (65 FE) MG tablet Take 325 mg by mouth every other day.  02/15/18   [provider]  fish oil-omega-3 fatty acids 1000 MG capsule Take 1 g by mouth daily.      [provider]  Flaxseed, Linseed, (FLAX SEED OIL PO) Take 1 capsule by mouth daily.      [provider]  furosemide (LASIX) 40 MG tablet Take 1 tablet (40 mg total) by mouth daily. 08/02/18   Carla Polite, MD   metFORMIN (GLUCOPHAGE) 1000 MG tablet Take 1,000 mg by mouth daily.     [provider]  pantoprazole (PROTONIX) 40 MG tablet Take 1 tablet (40 mg total) by mouth daily before breakfast. 11/24/17   Oretha Milch D, MD  Propylene Glycol (SYSTANE BALANCE) 0.6 % SOLN Apply 1 drop to eye as needed (dry eyes).    [provider]  simvastatin (ZOCOR) 20 MG tablet Take 1 tablet (20 mg total) by mouth at bedtime. 12/28/11   Nahser, Wonda Cheng, MD  vitamin A 8000 UNIT capsule Take 8,000 Units by mouth daily.  08/27/15   [provider]  vitamin C (ASCORBIC ACID) 500 MG tablet Take 500 mg by mouth daily.      [provider]    Allergies:   Valli Glance hcl]   Social History   Socioeconomic History  . Marital status: Married    Spouse name: Not on file  . Number of children: Not on file  . Years of education: Not on file  . Highest education level: Not on file  Occupational History  . Not on file  Social Needs  . Financial resource strain: Not on file  . Food insecurity:    Worry: Not on file    Inability: Not on file  . Transportation needs:    Medical: Not on file    Non-medical: Not on file  Tobacco Use  . Smoking status: Former Smoker    Packs/day: 1.00    Years: 18.00    Pack years: 18.00    Types: Cigarettes    Last attempt to quit: 01/13/1974    Years since quitting: 44.6  . Smokeless tobacco: Never Used  Substance and Sexual Activity  . Alcohol use: No  . Drug use: No  . Sexual activity: Not on file  Lifestyle  . Physical activity:    Days per week: Not on file    Minutes per session: Not on file  . Stress: Not on file  Relationships  . Social connections:    Talks on phone: Not on file    Gets together: Not on file    Attends religious service: Not on file    Active member of club or organization: Not on file    Attends meetings of clubs or organizations: Not on file    Relationship status: Not on file  Other Topics Concern  .  Not on file  Social History Narrative  . Not on file     Family History:  The patient's family history includes Atrial fibrillation in her mother; Emphysema in her father; Hypertension in her mother; Stroke in her mother.   ROS:   Please see the history of present illness.    ROS All other systems reviewed and are negative.   PHYSICAL EXAM:   VS:  BP 126/62   Pulse  78   Ht 5\' 6"  (1.676 m)   Wt 147 lb 6.4 oz (66.9 kg)   SpO2 94%   BMI 23.79 kg/m    GEN: Well nourished, well developed, in no acute distress  HEENT: normal  Neck: no JVD, carotid bruits, or masses Cardiac: RRR; no murmurs, rubs, or gallops,no edema  Respiratory:  clear to auscultation bilaterally, normal work of breathing GI: soft, nontender, nondistended, + BS MS: no deformity or atrophy  Skin: warm and dry, no rash Neuro:  Alert and Oriented x 3, Strength and sensation are intact Psych: euthymic mood, full affect  Wt Readings from Last 3 Encounters:  08/18/18 147 lb 6.4 oz (66.9 kg)  08/02/18 160 lb 11.5 oz (72.9 kg)  02/03/18 147 lb (66.7 kg)      Studies/Labs Reviewed:   EKG:  EKG is ordered today.  The ekg ordered today demonstrates NSR at rate of 78 bpm  Recent Labs: 07/25/2018: ALT 25; B Natriuretic Peptide 682.3 07/26/2018: Magnesium 1.5; TSH 0.933 08/02/2018: BUN 34; Creatinine, Ser 0.83; Hemoglobin 8.9; Platelets 386; Potassium 5.1; Sodium 132   Lipid Panel    Component Value Date/Time   CHOL 116 07/26/2018 0304   CHOL 131 01/11/2018 1011   TRIG 49 07/26/2018 0304   HDL 59 07/26/2018 0304   HDL 58 01/11/2018 1011   CHOLHDL 2.0 07/26/2018 0304   VLDL 10 07/26/2018 0304   LDLCALC 47 07/26/2018 0304   LDLCALC 57 01/11/2018 1011    Additional studies/ records that were reviewed today include:   TEE: 08/01/2018 Study Conclusions  - Left ventricle: Systolic function was normal. The estimated ejection fraction was in the range of 55% to 60%. Wall motion was normal; there were no  regional wall motion abnormalities. - Aortic valve: No evidence of vegetation. There was trivial regurgitation. - Mitral valve: Mildly calcified annulus. Mildly thickened leaflets . There was mild regurgitation. - Left atrium: No evidence of thrombus in the atrial cavity or appendage. No evidence of thrombus in the atrial cavity or appendage. - Right atrium: No evidence of thrombus in the atrial cavity or appendage. - Atrial septum: No defect or patent foramen ovale was identified. - Tricuspid valve: There was moderate regurgitation.  Impressions:  - The previously noted lesion on the LVOT surface of the aortic valve was not present. The heart is somewhat rotated and at times there is shadowing in this region seen on 3 chamber view that likely represents the non-coronary cusp. There was no evidence of endocarditis.  TTE: 07/26/2018 Study Conclusions  - Left ventricle: The cavity size was normal. Systolic function was   normal. The estimated ejection fraction was in the range of 60%   to 65%. Wall motion was normal; there were no regional wall   motion abnormalities. Features are consistent with a pseudonormal   left ventricular filling pattern, with concomitant abnormal   relaxation and increased filling pressure (grade 2 diastolic   dysfunction). Doppler parameters are consistent with high   ventricular filling pressure. - Aortic valve: There is a mobile density on the LVOT side of the   aortic valve measuring 0.715 x 2.35cm of unknown etiology.   Recommend TEE for further evaulation. Valve area (VTI): 1.9 cm^2.   Valve area (Vmax): 1.82 cm^2. Valve area (Vmean): 1.72 cm^2. - Mitral valve: Severely calcified annulus. There was mild   regurgitation. Valve area by continuity equation (using LVOT   flow): 1.44 cm^2. - Left atrium: The atrium was moderately to severely dilated. -  Tricuspid valve: There was moderate-severe regurgitation. - Pulmonary arteries:  PA peak pressure: 80 mm Hg (S).  Impressions:  - The right ventricular systolic pressure was increased consistent   with severe pulmonary hypertension.  ASSESSMENT & PLAN:    1. Chronic diastolic CHF - Weight is trending down. No dyspnea or orthopnea. LE edema likely due to excess salt. Advised to avoid food high in salt. Continue lasix at current dose.   2. Aortic valve density - No evidence of aortic valve mass by TEE.   3. HTN - BP stable on current medications   Medication Adjustments/Labs and Tests Ordered: Current medicines are reviewed at length with the patient today.  Concerns regarding medicines are outlined above.  Medication changes, Labs and Tests ordered today are listed in the Patient Instructions below. Patient Instructions  Medication Instructions:  Your physician recommends that you continue on your current medications as directed. Please refer to the Current Medication list given to you today.  If you need a refill on your cardiac medications before your next appointment, please call your pharmacy.   Lab work: None ordered  If you have labs (blood work) drawn today and your tests are completely normal, you will receive your results only by: Marland Kitchen MyChart Message (if you have MyChart) OR . A paper copy in the mail If you have any lab test that is abnormal or we need to change your treatment, we will call you to review the results.  Testing/Procedures: None ordered  Follow-Up: Your physician recommends that you schedule a follow-up appointment in: 3-4 MONTHS WITH DR. Acie Fredrickson   Any Other Special Instructions Will Be Listed Below (If Applicable).       Jarrett Soho, [place]  08/18/2018 10:34 AM    [facility] [place], [place], [place]  [address] Phone: [phone]; Fax: [phone]

## 2018-08-22 ENCOUNTER — Other Ambulatory Visit: Payer: Self-pay

## 2018-08-22 ENCOUNTER — Ambulatory Visit (INDEPENDENT_AMBULATORY_CARE_PROVIDER_SITE_OTHER): Payer: Medicare Other | Admitting: Family Medicine

## 2018-08-22 ENCOUNTER — Encounter: Payer: Self-pay | Admitting: Family Medicine

## 2018-08-22 VITALS — BP 148/52 | HR 81 | Temp 98.1°F | Resp 16 | Ht 66.0 in | Wt 148.8 lb

## 2018-08-22 DIAGNOSIS — E871 Hypo-osmolality and hyponatremia: Secondary | ICD-10-CM

## 2018-08-22 DIAGNOSIS — J189 Pneumonia, unspecified organism: Secondary | ICD-10-CM

## 2018-08-22 DIAGNOSIS — I10 Essential (primary) hypertension: Secondary | ICD-10-CM | POA: Diagnosis not present

## 2018-08-22 DIAGNOSIS — J181 Lobar pneumonia, unspecified organism: Secondary | ICD-10-CM

## 2018-08-22 DIAGNOSIS — E119 Type 2 diabetes mellitus without complications: Secondary | ICD-10-CM

## 2018-08-22 DIAGNOSIS — I5033 Acute on chronic diastolic (congestive) heart failure: Secondary | ICD-10-CM | POA: Diagnosis not present

## 2018-08-22 DIAGNOSIS — D509 Iron deficiency anemia, unspecified: Secondary | ICD-10-CM

## 2018-08-22 LAB — CBC WITH DIFFERENTIAL/PLATELET
BASOS ABS: 0.1 10*3/uL (ref 0.0–0.1)
Basophils Relative: 1.2 % (ref 0.0–3.0)
Eosinophils Absolute: 0.1 10*3/uL (ref 0.0–0.7)
Eosinophils Relative: 1.6 % (ref 0.0–5.0)
HCT: 30.6 % — ABNORMAL LOW (ref 36.0–46.0)
HEMOGLOBIN: 10.2 g/dL — AB (ref 12.0–15.0)
Lymphocytes Relative: 20.8 % (ref 12.0–46.0)
Lymphs Abs: 1.3 10*3/uL (ref 0.7–4.0)
MCHC: 33.3 g/dL (ref 30.0–36.0)
MCV: 85.1 fl (ref 78.0–100.0)
Monocytes Absolute: 0.6 10*3/uL (ref 0.1–1.0)
Monocytes Relative: 9.8 % (ref 3.0–12.0)
Neutro Abs: 4.3 10*3/uL (ref 1.4–7.7)
Neutrophils Relative %: 66.6 % (ref 43.0–77.0)
Platelets: 319 10*3/uL (ref 150.0–400.0)
RBC: 3.59 Mil/uL — ABNORMAL LOW (ref 3.87–5.11)
RDW: 16.9 % — ABNORMAL HIGH (ref 11.5–15.5)
WBC: 6.4 10*3/uL (ref 4.0–10.5)

## 2018-08-22 LAB — COMPREHENSIVE METABOLIC PANEL
ALT: 11 U/L (ref 0–35)
AST: 18 U/L (ref 0–37)
Albumin: 4 g/dL (ref 3.5–5.2)
Alkaline Phosphatase: 24 U/L — ABNORMAL LOW (ref 39–117)
BUN: 41 mg/dL — ABNORMAL HIGH (ref 6–23)
CHLORIDE: 100 meq/L (ref 96–112)
CO2: 25 mEq/L (ref 19–32)
Calcium: 9.2 mg/dL (ref 8.4–10.5)
Creatinine, Ser: 1.37 mg/dL — ABNORMAL HIGH (ref 0.40–1.20)
GFR: 37.17 mL/min — ABNORMAL LOW (ref >60.00)
Glucose, Bld: 149 mg/dL — ABNORMAL HIGH (ref 70–99)
Potassium: 4.1 mEq/L (ref 3.5–5.1)
Sodium: 134 mEq/L — ABNORMAL LOW (ref 135–145)
Total Bilirubin: 0.4 mg/dL (ref 0.2–1.2)
Total Protein: 6.8 g/dL (ref 6.0–8.3)

## 2018-08-22 NOTE — Patient Instructions (Signed)
Please return in 3 months for diabetes follow up and recheck.  Keep daily weights.  We did not change any of your medications today.    It was a pleasure meeting you today! Thank you for choosing [facility] to meet your healthcare needs! I truly look forward to working with you. If you have any questions or concerns, please send me a message via Mychart or call the office at [phone].

## 2018-08-22 NOTE — Progress Notes (Signed)
Subjective  CC:  Chief Complaint  Patient presents with  . Establish Care    Previous pt of Dr. Redmond Pulling was recently in the hospital after having pneumonia    HPI: Carla Foster is a 78 y.o. female who presents to [place] at [facility] today to establish care with me as a new patient.  I've personally reviewed recent office visit notes, hospital notes, associated labs and imaging reports and/or pertinent outside office records via chart review or CareEverywhere. Briefly, 78 yo female who was in ICU / hospital for ARF due to CAP and severe hyponatremia with acute on chronic chf 1/6-14. I have reviewed multiple records. Here to establish care  She has the following concerns or needs:  CAP: recovering well. Breathing is back to baseline. Energy remains a little low.   CHF: reviewed recent heart doctor notes. Has mild edema today. On lasix. No h/o CAD.   Hyponatremia, severe: multifactorial due to hypervolemia and hctz +/- SIADH; improved. Last labs 1/14 reviewed.   Anemia: reports "anemic all my life" - hgb was 7 in hospital and required 1 unit PRBC. Review of old records show last normal hgb was in 2016; in 2019 she had a GI bleed; has been around 8-9 ever since. I need GI records for further clarification of etiology. She is on iron now.   HTN managed with doxazosin and amlodipine.  DM: with recent A1c >7. Had been well controlled prior to illness per pt. On metformin only.   On statin for hld.   Mood is good. Lives independently.   HM: reports mammo is up todate. Needs dexa, cpe and AWV once stable.   Assessment  1. Community acquired pneumonia of right lower lobe of lung ([place])   2. Acute on chronic diastolic CHF (congestive heart failure) ([place])   3. Benign essential hypertension   4. Type 2 diabetes mellitus without complication, without long-term current use of insulin ([place])   5. Hyponatremia   6. Iron deficiency anemia, unspecified iron deficiency  anemia type      Plan   Recovering well. Recheck blood work including cmp and cbc. Request old records. No med changes today. To keep an eye on her LE swelling and daily weights. Has f/u with cards.   Continue iron for anemia. Need to clarify why she has a chronic anemia. Ensure it is stable today after transfusion.   Recheck sodium. Should be stable off hctz.   HM: need to clarify pneumonia vaccination; needs shingrix.   Follow up:  Return in about 3 months (around 11/20/2018) for follow up of diabetes and hypertension, recheck. Orders Placed This Encounter  Procedures  . Comprehensive metabolic panel  . CBC with Differential/Platelet   No orders of the defined types were placed in this encounter.    No flowsheet data found.  We updated and reviewed the patient's past history in detail and it is documented below.  Patient Active Problem List   Diagnosis Date Noted  . Iron deficiency anemia 08/22/2018  . Acute on chronic diastolic CHF (congestive heart failure) ([place]) 07/31/2018  . Type 2 diabetes mellitus ([place]) 07/26/2018  . Malignant hypertension 07/26/2018  . Community acquired pneumonia of right lower lobe of lung ([place])   . Hyponatremia 11/20/2017  . Benign essential hypertension 08/27/2015  . Hearing loss 08/27/2015  . Pulmonary hypertension ([place]) 08/27/2015  . Asthma 08/27/2015  . Hyperlipidemia 01/23/2011   Health Maintenance  Topic Date Due  . FOOT EXAM  05/11/1951  . DEXA SCAN  05/11/2004  . PNA vac Low Risk Adult (2 of 2 - PCV13) 05/08/2008  . TETANUS/TDAP  03/29/2017  . HEMOGLOBIN A1C  01/29/2019  . OPHTHALMOLOGY EXAM  07/05/2019  . INFLUENZA VACCINE  Completed   Immunization History  Administered Date(s) Administered  . Influenza, High Dose Seasonal PF 04/20/2016, 04/20/2017, 04/15/2018  . Pneumococcal Polysaccharide-23 05/09/2007  . Td 03/30/2007   Current Meds  Medication Sig  . albuterol (PROVENTIL HFA;VENTOLIN HFA) 108 (90 Base) MCG/ACT inhaler  Inhale 2 puffs into the lungs every 6 (six) hours as needed for wheezing or shortness of breath.   Marland Kitchen amLODipine (NORVASC) 10 MG tablet Take 10 mg by mouth daily.  . calcium carbonate (TUMS - DOSED IN MG ELEMENTAL CALCIUM) 500 MG chewable tablet Chew 1-2 tablets by mouth daily as needed for indigestion or heartburn.   . Calcium Carbonate-Vitamin D (CALCIUM 600 + D PO) Take 1 tablet by mouth daily.    . carvedilol (COREG) 25 MG tablet TAKE 1 TABLET BY MOUTH TWICE A DAY (Patient taking differently: Take 25 mg by mouth 2 (two) times daily with a meal. )  . cholecalciferol (VITAMIN D) 1000 UNITS tablet Take 1,000 Units by mouth daily.    Marland Kitchen doxazosin (CARDURA) 4 MG tablet Take 1 tablet (4 mg total) by mouth at bedtime.  . fenofibrate 160 MG tablet TAKE 1 TABLET BY MOUTH EVERY DAY  . ferrous sulfate 325 (65 FE) MG tablet Take 325 mg by mouth every other day.   . furosemide (LASIX) 40 MG tablet Take 1 tablet (40 mg total) by mouth daily.  . metFORMIN (GLUCOPHAGE) 1000 MG tablet Take 1,000 mg by mouth daily.   . pantoprazole (PROTONIX) 40 MG tablet Take 1 tablet (40 mg total) by mouth daily before breakfast.  . Propylene Glycol (SYSTANE BALANCE) 0.6 % SOLN Apply 1 drop to eye as needed (dry eyes).  . simvastatin (ZOCOR) 20 MG tablet Take 1 tablet (20 mg total) by mouth at bedtime.  . vitamin A 8000 UNIT capsule Take 8,000 Units by mouth daily.   . vitamin C (ASCORBIC ACID) 500 MG tablet Take 500 mg by mouth daily.    . [DISCONTINUED] Flaxseed, Linseed, (FLAX SEED OIL PO) Take 1 capsule by mouth daily.      Allergies: Patient is allergic to tiazac [diltiazem hcl]. Past Medical History Patient  has a past medical history of Acute blood loss anemia, AKI (acute kidney injury) ([place]), Asthma, Complication of anesthesia, Diabetes mellitus, GERD (gastroesophageal reflux disease), GI bleed, Hematemesis, Hyperlipidemia, Hypertension, and Melena. Past Surgical History Patient  has a past surgical history that  includes Partial hysterectomy; Cervical discectomy (08/23/00); Esophagogastroduodenoscopy (egd) with propofol (N/A, 11/21/2017); and TEE without cardioversion (N/A, 08/01/2018). Family History: Patient family history includes Asthma in her daughter; Atrial fibrillation in her mother; Cancer in her daughter; Emphysema in her father; Hypertension in her mother; Stroke in her mother. Social History:  Patient  reports that she quit smoking about 44 years ago. Her smoking use included cigarettes. She has a 18.00 pack-year smoking history. She has never used smokeless tobacco. She reports that she does not drink alcohol or use drugs.  Review of Systems: Constitutional: negative for fever or malaise Ophthalmic: negative for photophobia, double vision or loss of vision Cardiovascular: negative for chest pain, dyspnea on exertion, or new LE swelling Respiratory: negative for SOB or persistent cough Gastrointestinal: negative for abdominal pain, change in bowel habits or melena Genitourinary: negative for dysuria or  gross hematuria Musculoskeletal: negative for new gait disturbance or muscular weakness Integumentary: negative for new or persistent rashes Neurological: negative for TIA or stroke symptoms Psychiatric: negative for SI or delusions Allergic/Immunologic: negative for hives  Patient Care Team    Relationship Specialty Notifications Start End  Leamon Arnt, MD PCP - General Family Medicine  08/18/18   Juanita Craver, MD Consulting Physician Gastroenterology  08/22/18   Mammography, [place]  Diagnostic Radiology  08/22/18     Objective  Vitals: BP (!) 148/52   Pulse 81   Temp 98.1 F (36.7 C) (Oral)   Resp 16   Ht 5\' 6"  (1.676 m)   Wt 148 lb 12.8 oz (67.5 kg)   SpO2 96%   BMI 24.02 kg/m  General:  Well developed, well nourished, no acute distress  Psych:  Alert and oriented,normal mood and affect HEENT:  Normocephalic, atraumatic, non-icteric sclera, PERRL, oropharynx is without mass or  exudate, supple neck without adenopathy, mass or thyromegaly Cardiovascular:  RRR without gallop, 3/6 systolic murmur Respiratory:  Good breath sounds bilaterally, CTAB with normal respiratory effort Gastrointestinal: normal bowel sounds, soft, non-tender, no noted masses. No HSM MSK: no deformities, contusions. Joints are without erythema or swelling, + 1 pitting edema bilaterally Skin:  Warm, no rashes or suspicious lesions noted Neurologic:    Mental status is normal. Gross motor and sensory exams are normal. Normal gait   Commons side effects, risks, benefits, and alternatives for medications and treatment plan prescribed today were discussed, and the patient expressed understanding of the given instructions. Patient is instructed to call or message via MyChart if he/she has any questions or concerns regarding our treatment plan. No barriers to understanding were identified. We discussed Red Flag symptoms and signs in detail. Patient expressed understanding regarding what to do in case of urgent or emergency type symptoms.   Medication list was reconciled, printed and provided to the patient in AVS. Patient instructions and summary information was reviewed with the patient as documented in the AVS. This note was prepared with assistance of Dragon voice recognition software. Occasional wrong-word or sound-a-like substitutions may have occurred due to the inherent limitations of voice recognition software

## 2018-08-23 ENCOUNTER — Encounter: Payer: Self-pay | Admitting: *Deleted

## 2018-08-23 DIAGNOSIS — E871 Hypo-osmolality and hyponatremia: Secondary | ICD-10-CM | POA: Diagnosis not present

## 2018-08-23 DIAGNOSIS — Z87891 Personal history of nicotine dependence: Secondary | ICD-10-CM | POA: Diagnosis not present

## 2018-08-23 DIAGNOSIS — J189 Pneumonia, unspecified organism: Secondary | ICD-10-CM | POA: Diagnosis not present

## 2018-08-23 DIAGNOSIS — J45901 Unspecified asthma with (acute) exacerbation: Secondary | ICD-10-CM | POA: Diagnosis not present

## 2018-08-23 DIAGNOSIS — Z7984 Long term (current) use of oral hypoglycemic drugs: Secondary | ICD-10-CM | POA: Diagnosis not present

## 2018-08-23 DIAGNOSIS — I110 Hypertensive heart disease with heart failure: Secondary | ICD-10-CM | POA: Diagnosis not present

## 2018-08-23 DIAGNOSIS — I5033 Acute on chronic diastolic (congestive) heart failure: Secondary | ICD-10-CM | POA: Diagnosis not present

## 2018-08-23 DIAGNOSIS — E119 Type 2 diabetes mellitus without complications: Secondary | ICD-10-CM | POA: Diagnosis not present

## 2018-08-23 NOTE — Progress Notes (Signed)
Please call patient: I have reviewed his/her lab results. Things continue to improve: sodium has almost normalized, and anemia is improved as well. Kidney function is a little worse. Continue current medications as is for now. I will forward to cardiology to make him aware. Thanks.

## 2018-08-26 DIAGNOSIS — J189 Pneumonia, unspecified organism: Secondary | ICD-10-CM | POA: Diagnosis not present

## 2018-08-26 DIAGNOSIS — I5033 Acute on chronic diastolic (congestive) heart failure: Secondary | ICD-10-CM | POA: Diagnosis not present

## 2018-08-26 DIAGNOSIS — J45901 Unspecified asthma with (acute) exacerbation: Secondary | ICD-10-CM | POA: Diagnosis not present

## 2018-08-26 DIAGNOSIS — E119 Type 2 diabetes mellitus without complications: Secondary | ICD-10-CM | POA: Diagnosis not present

## 2018-08-26 DIAGNOSIS — Z7984 Long term (current) use of oral hypoglycemic drugs: Secondary | ICD-10-CM | POA: Diagnosis not present

## 2018-08-26 DIAGNOSIS — E871 Hypo-osmolality and hyponatremia: Secondary | ICD-10-CM | POA: Diagnosis not present

## 2018-08-26 DIAGNOSIS — Z87891 Personal history of nicotine dependence: Secondary | ICD-10-CM | POA: Diagnosis not present

## 2018-08-26 DIAGNOSIS — I110 Hypertensive heart disease with heart failure: Secondary | ICD-10-CM | POA: Diagnosis not present

## 2018-09-02 DIAGNOSIS — I5033 Acute on chronic diastolic (congestive) heart failure: Secondary | ICD-10-CM | POA: Diagnosis not present

## 2018-09-05 ENCOUNTER — Other Ambulatory Visit: Payer: Self-pay

## 2018-09-05 ENCOUNTER — Encounter: Payer: Self-pay | Admitting: Family Medicine

## 2018-09-05 ENCOUNTER — Ambulatory Visit (INDEPENDENT_AMBULATORY_CARE_PROVIDER_SITE_OTHER): Payer: Medicare Other | Admitting: Family Medicine

## 2018-09-05 ENCOUNTER — Telehealth: Payer: Self-pay | Admitting: *Deleted

## 2018-09-05 ENCOUNTER — Encounter: Payer: Self-pay | Admitting: *Deleted

## 2018-09-05 VITALS — BP 124/74 | HR 61 | Temp 97.9°F | Resp 16 | Ht 66.0 in | Wt 152.8 lb

## 2018-09-05 DIAGNOSIS — E871 Hypo-osmolality and hyponatremia: Secondary | ICD-10-CM | POA: Diagnosis not present

## 2018-09-05 DIAGNOSIS — S81801A Unspecified open wound, right lower leg, initial encounter: Secondary | ICD-10-CM

## 2018-09-05 DIAGNOSIS — Z8379 Family history of other diseases of the digestive system: Secondary | ICD-10-CM

## 2018-09-05 DIAGNOSIS — I5032 Chronic diastolic (congestive) heart failure: Secondary | ICD-10-CM | POA: Diagnosis not present

## 2018-09-05 DIAGNOSIS — D500 Iron deficiency anemia secondary to blood loss (chronic): Secondary | ICD-10-CM | POA: Diagnosis not present

## 2018-09-05 DIAGNOSIS — Z8719 Personal history of other diseases of the digestive system: Secondary | ICD-10-CM

## 2018-09-05 HISTORY — DX: Personal history of other diseases of the digestive system: Z87.19

## 2018-09-05 HISTORY — DX: Family history of other diseases of the digestive system: Z83.79

## 2018-09-05 LAB — RENAL FUNCTION PANEL
Albumin: 4.1 g/dL (ref 3.5–5.2)
BUN: 29 mg/dL — ABNORMAL HIGH (ref 6–23)
CALCIUM: 9.7 mg/dL (ref 8.4–10.5)
CO2: 28 mEq/L (ref 19–32)
Chloride: 102 mEq/L (ref 96–112)
Creatinine, Ser: 1.09 mg/dL (ref 0.40–1.20)
GFR: 48.38 mL/min — ABNORMAL LOW (ref >60.00)
Glucose, Bld: 59 mg/dL — ABNORMAL LOW (ref 70–99)
POTASSIUM: 4.4 meq/L (ref 3.5–5.1)
Phosphorus: 3.8 mg/dL (ref 2.3–4.6)
Sodium: 138 mEq/L (ref 135–145)

## 2018-09-05 MED ORDER — AMOXICILLIN-POT CLAVULANATE 875-125 MG PO TABS: 1.0000 | ORAL_TABLET | Freq: Two times a day (BID) | ORAL | 0 refills | Status: DC

## 2018-09-05 NOTE — Telephone Encounter (Signed)
Daughter aware the appointment is not needed and has been cancelled

## 2018-09-05 NOTE — Telephone Encounter (Signed)
Copied from [place]. Topic: General - Other >> Sep 05, 2018  4:09 PM Judyann Munson wrote: Reason for CRM: Patient daughter is calling to confirm if the patient needs to keep her follow up appt on 09-14-2018 with Dr.Andy, because they were seen today. She would like a call back.Please advise

## 2018-09-05 NOTE — Progress Notes (Signed)
Please call patient: I have reviewed his/her lab results. Kidney function looks better on the lower dose of lasix. Continue taking 1/2 pill per day and monitoring weight and swelling.

## 2018-09-05 NOTE — Progress Notes (Signed)
Subjective  CC:  Chief Complaint  Patient presents with  . Leg Pain    Right knee/calf, swelling and painful.Drucilla Chalet about 3 days ago    HPI: Carla Foster is a 78 y.o. female who presents to the office today to address the problems listed above in the chief complaint.  78 year old female with recent office visit here for follow-up after community-acquired pneumonia with acute respiratory failure and acute on chronic heart failure.  I reviewed recent records again and recent office visit.  She is here today for follow-up due to increased lower extremity edema worse on the right.  Associated with a right leg wound.  She noticed some scabbing and was picking at it, then noticed increased swelling redness and warmth over the weekend.  Swelling is improved.  She has been keeping daily weights and reports her weights ranged from 147-152 without an obvious trend.  She has been on Lasix 20 mg daily which was less than her discharge dose due to a bump in her creatinine.  She denies chest pain, palpitations or shortness of breath.  She denies fevers.  She denies leg pain, although it is sore to touch.  History of upper GI bleed in May 2019 with persistent anemia.  This had improved after 1 unit of blood transfusion last month.  She denies melena or abdominal pain.  She had seen Dr. Arelia Longest in the hospital.  I do not have records from colonoscopy.  Hyponatremia, severe while hospitalized last month.  Had improved significantly.  No mental status changes today.  Wt Readings from Last 3 Encounters:  09/05/18 152 lb 12.8 oz (69.3 kg)  08/22/18 148 lb 12.8 oz (67.5 kg)  08/18/18 147 lb 6.4 oz (66.9 kg)   BP Readings from Last 3 Encounters:  09/05/18 124/74  08/22/18 (!) 148/52  08/18/18 126/62   Lab Results  Component Value Date   CREATININE 1.37 (H) 08/22/2018   BUN 41 (H) 08/22/2018   NA 134 (L) 08/22/2018   K 4.1 08/22/2018   CL 100 08/22/2018   CO2 25 08/22/2018   Lab Results    Component Value Date   WBC 6.4 08/22/2018   HGB 10.2 (L) 08/22/2018   HCT 30.6 (L) 08/22/2018   MCV 85.1 08/22/2018   PLT 319.0 08/22/2018   Lab Results  Component Value Date   HGBA1C 7.3 (H) 07/31/2018     Assessment  1. Leg wound, right, initial encounter   2. Chronic heart failure with preserved ejection fraction ([place])   3. Hyponatremia   4. Iron deficiency anemia due to chronic blood loss      Plan   Lower extremity wound with associated swelling and diabetic patient: Wound care discussed.  Augmentin x7 days.  Follow-up if worsens.  Chronic lower extremity edema due to heart failure.  Check renal function today.  Would like to increase Lasix if possible.  She does have follow-up with her cardiologist in May.  No signs of left heart failure.  Monitoring sodium and potassium.  Iron deficiency anemia: Had been improving.  Continue iron.  Will recheck at next visit.  Discussed diabetic foot care and wound care.  Follow up: Return in about 3 months (around 12/04/2018) for complete physical.  09/14/2018  Orders Placed This Encounter  Procedures  . Renal Function Panel   Meds ordered this encounter  Medications  . amoxicillin-clavulanate (AUGMENTIN) 875-125 MG tablet    Sig: Take 1 tablet by mouth 2 (two) times daily.  Dispense:  14 tablet    Refill:  0      I reviewed the patients updated PMH, FH, and SocHx.    Patient Active Problem List   Diagnosis Date Noted  . Chronic heart failure with preserved ejection fraction ([place]) 09/05/2018    Priority: High  . H/O: upper GI bleed 09/05/2018    Priority: High  . Type 2 diabetes mellitus ([place]) 07/26/2018    Priority: High  . Malignant hypertension 07/26/2018    Priority: High  . Pulmonary hypertension ([place]) 08/27/2015    Priority: High  . Hyperlipidemia 01/23/2011    Priority: High  . Iron deficiency anemia 08/22/2018    Priority: Medium  . Hearing loss 08/27/2015    Priority: Medium  . Asthma 08/27/2015     Priority: Medium  . Acute on chronic diastolic CHF (congestive heart failure) ([place]) 07/31/2018  . Community acquired pneumonia of right lower lobe of lung ([place])   . Hyponatremia 11/20/2017   Current Meds  Medication Sig  . albuterol (PROVENTIL HFA;VENTOLIN HFA) 108 (90 Base) MCG/ACT inhaler Inhale 2 puffs into the lungs every 6 (six) hours as needed for wheezing or shortness of breath.   Marland Kitchen amLODipine (NORVASC) 10 MG tablet Take 10 mg by mouth daily.  . calcium carbonate (TUMS - DOSED IN MG ELEMENTAL CALCIUM) 500 MG chewable tablet Chew 1-2 tablets by mouth daily as needed for indigestion or heartburn.   . Calcium Carbonate-Vitamin D (CALCIUM 600 + D PO) Take 1 tablet by mouth daily.    . carvedilol (COREG) 25 MG tablet TAKE 1 TABLET BY MOUTH TWICE A DAY (Patient taking differently: Take 25 mg by mouth 2 (two) times daily with a meal. )  . cholecalciferol (VITAMIN D) 1000 UNITS tablet Take 1,000 Units by mouth daily.    Marland Kitchen doxazosin (CARDURA) 4 MG tablet Take 1 tablet (4 mg total) by mouth at bedtime.  . fenofibrate 160 MG tablet TAKE 1 TABLET BY MOUTH EVERY DAY  . ferrous sulfate 325 (65 FE) MG tablet Take 325 mg by mouth every other day.   . furosemide (LASIX) 40 MG tablet Take 1 tablet (40 mg total) by mouth daily.  . metFORMIN (GLUCOPHAGE) 1000 MG tablet Take 1,000 mg by mouth daily.   . pantoprazole (PROTONIX) 40 MG tablet Take 1 tablet (40 mg total) by mouth daily before breakfast.  . Propylene Glycol (SYSTANE BALANCE) 0.6 % SOLN Apply 1 drop to eye as needed (dry eyes).  . simvastatin (ZOCOR) 20 MG tablet Take 1 tablet (20 mg total) by mouth at bedtime.  . vitamin A 8000 UNIT capsule Take 8,000 Units by mouth daily.   . vitamin C (ASCORBIC ACID) 500 MG tablet Take 500 mg by mouth daily.      Allergies: Patient is allergic to tiazac [diltiazem hcl]. Family History: Patient family history includes Asthma in her daughter; Atrial fibrillation in her mother; Cancer in her daughter;  Emphysema in her father; Hypertension in her mother; Stroke in her mother. Social History:  Patient  reports that she quit smoking about 44 years ago. Her smoking use included cigarettes. She has a 18.00 pack-year smoking history. She has never used smokeless tobacco. She reports that she does not drink alcohol or use drugs.  Review of Systems: Constitutional: Negative for fever malaise or anorexia Cardiovascular: negative for chest pain Respiratory: negative for SOB or persistent cough Gastrointestinal: negative for abdominal pain  Objective  Vitals: BP 124/74   Pulse 61   Temp 97.9  F (36.6 C) (Oral)   Resp 16   Ht 5\' 6"  (1.676 m)   Wt 152 lb 12.8 oz (69.3 kg)   SpO2 97%   BMI 24.66 kg/m  General: no acute distress , A&Ox3 HEENT: PEERL, conjunctiva normal, Oropharynx moist,neck is supple Cardiovascular:  RRR with systolic murmur, no gallop Respiratory:  Good breath sounds bilaterally, CTAB with normal respiratory effort Skin:  Warm, no rashes Left lower extremity with +1 pitting edema, right with tender +2 pitting edema and scabbed wound medial lower extremity below knee with surrounding erythema and warmth.  No fluctuance.  No cords palpated.     Commons side effects, risks, benefits, and alternatives for medications and treatment plan prescribed today were discussed, and the patient expressed understanding of the given instructions. Patient is instructed to call or message via MyChart if he/she has any questions or concerns regarding our treatment plan. No barriers to understanding were identified. We discussed Red Flag symptoms and signs in detail. Patient expressed understanding regarding what to do in case of urgent or emergency type symptoms.   Medication list was reconciled, printed and provided to the patient in AVS. Patient instructions and summary information was reviewed with the patient as documented in the AVS. This note was prepared with assistance of Dragon voice  recognition software. Occasional wrong-word or sound-a-like substitutions may have occurred due to the inherent limitations of voice recognition software

## 2018-09-05 NOTE — Patient Instructions (Addendum)
Please follow up if symptoms do not improve or as needed.   I will let you know about if you need to return on the 25th for follow up.   Take the antibiotic as directed.  I will dose your lasix based upon your lab work results.

## 2018-09-06 ENCOUNTER — Telehealth: Payer: Self-pay | Admitting: *Deleted

## 2018-09-06 NOTE — Telephone Encounter (Signed)
Letter has been faxed. om CRM [phone]. Topic: General - Other >> Sep 06, 2018  2:19 PM Alanda Slim E wrote: Reason for CRM: on 1.14.2020. Pt was sent home from [place] with an oxygen tank. Pt is trying to return the tank to [facility] and [facility] advised the Pt that the PCP needs to fill out a form stating Pt doesn't need the oxygen anymore before they will come out and pick it up/please fax that form to fax# [phone] please advise Pt when form has been faxed

## 2018-09-12 ENCOUNTER — Ambulatory Visit: Payer: Medicare Other | Admitting: Cardiovascular Disease

## 2018-09-14 ENCOUNTER — Ambulatory Visit: Payer: Medicare Other | Admitting: Family Medicine

## 2018-11-21 ENCOUNTER — Ambulatory Visit (INDEPENDENT_AMBULATORY_CARE_PROVIDER_SITE_OTHER): Payer: Medicare Other | Admitting: Family Medicine

## 2018-11-21 ENCOUNTER — Encounter: Payer: Self-pay | Admitting: Family Medicine

## 2018-11-21 ENCOUNTER — Other Ambulatory Visit: Payer: Self-pay

## 2018-11-21 VITALS — BP 148/50 | HR 67 | Wt 155.0 lb

## 2018-11-21 DIAGNOSIS — I5032 Chronic diastolic (congestive) heart failure: Secondary | ICD-10-CM | POA: Diagnosis not present

## 2018-11-21 DIAGNOSIS — E119 Type 2 diabetes mellitus without complications: Secondary | ICD-10-CM | POA: Diagnosis not present

## 2018-11-21 DIAGNOSIS — D508 Other iron deficiency anemias: Secondary | ICD-10-CM | POA: Diagnosis not present

## 2018-11-21 DIAGNOSIS — I10 Essential (primary) hypertension: Secondary | ICD-10-CM | POA: Diagnosis not present

## 2018-11-21 MED ORDER — FUROSEMIDE 40 MG PO TABS: 20.0000 mg | ORAL_TABLET | Freq: Every day | ORAL | 0 refills | Status: DC

## 2018-11-21 NOTE — Progress Notes (Signed)
TELEPHONE ENCOUNTER   Patient verbally agreed to telephone visit and is aware that copayment and coinsurance may apply. Patient was treated using telemedicine according to accepted telemedicine protocols.  Location of the patient: home Location of provider: [place], [place] office Names of all persons participating in the telemedicine service and role in the encounter: Leamon Arnt, MD Lilli Light, CMA    Subjective  CC:  Chief Complaint  Patient presents with  . Diabetes    She reports this morning her blood sugar 140  . Iron deficiency anemia  . Hypertension  . Leg Swelling    She reports she is still having the swelling and it gets hard at times    HPI: Carla Foster is a 78 y.o. female who was telephoned today to address the problems listed above in the chief complaint.  Diabetes follow up: Her diabetic control is reported as Unchanged. fastings are fairly well controlled; fluctuate depending upon what she eats and vary from 90s-140. She denies sxs of hyperglycemia. Has gaines some weight; eating more.  She denies exertional CP or SOB or symptomatic hypoglycemia. She denies foot sores or paresthesias.  LE edema: persists but resolves with leg elevation. On lasix 20mg  daily. Denies leg wounds at this time. Reports swelling is at times "firm" if she is on her feet a bunch/working in the yard etc... of note, she is on amlodipine.   CHF: as above. No left heart sxs.   HTN: reports hasn't checked except for this morning. Mildly elevated today. Feeling well. Taking medications w/o adverse effects. No symptoms of CHF, angina; no palpitations, sob, cp or lower extremity edema. Compliant with meds.   Remains on iron tx; due for anemia f/u after gi blood loss last year.  Immunization History  Administered Date(s) Administered  . Influenza, High Dose Seasonal PF 04/20/2016, 04/20/2017, 04/15/2018  . Pneumococcal Polysaccharide-23 05/09/2007  . Td 03/30/2007     Diabetes Related Lab Review: Lab Results  Component Value Date   HGBA1C 7.3 (H) 07/31/2018   HGBA1C 6.8 (H) 11/20/2017    No results found for: Derl Barrow Lab Results  Component Value Date   CREATININE 1.09 09/05/2018   BUN 29 (H) 09/05/2018   NA 138 09/05/2018   K 4.4 09/05/2018   CL 102 09/05/2018   CO2 28 09/05/2018   Lab Results  Component Value Date   CHOL 116 07/26/2018   CHOL 131 01/11/2018   CHOL 133 03/17/2016   Lab Results  Component Value Date   HDL 59 07/26/2018   HDL 58 01/11/2018   HDL 67 03/17/2016   Lab Results  Component Value Date   LDLCALC 47 07/26/2018   LDLCALC 57 01/11/2018   LDLCALC 48 03/17/2016   Lab Results  Component Value Date   TRIG 49 07/26/2018   TRIG 78 01/11/2018   TRIG 89 03/17/2016   Lab Results  Component Value Date   CHOLHDL 2.0 07/26/2018   CHOLHDL 2.3 01/11/2018   CHOLHDL 2.0 03/17/2016   No results found for: LDLDIRECT The ASCVD Risk score Mikey Bussing DC Jr., et al., 2013) failed to calculate for the following reasons:   The valid total cholesterol range is 130 to 320 mg/dL  BP Readings from Last 3 Encounters:  11/21/18 (!) 148/50  09/05/18 124/74  08/22/18 (!) 148/52   Wt Readings from Last 3 Encounters:  11/21/18 155 lb (70.3 kg)  09/05/18 152 lb 12.8 oz (69.3 kg)  08/22/18 148 lb 12.8 oz (  67.5 kg)    Health Maintenance  Topic Date Due  . FOOT EXAM  05/[birth year]/1949  . DEXA SCAN  05/11/2004  . PNA vac Low Risk Adult (2 of 2 - PCV13) 05/08/2008  . TETANUS/TDAP  03/29/2017  . HEMOGLOBIN A1C  01/29/2019  . INFLUENZA VACCINE  02/18/2019  . OPHTHALMOLOGY EXAM  07/05/2019    ASSESSMENT: 1. Type 2 diabetes mellitus without complication, without long-term current use of insulin ([place])   2. Chronic heart failure with preserved ejection fraction ([place])   3. Malignant hypertension   4. Other iron deficiency anemia      See below for problem based assessment and plan documentation  Time spent with the  patient (non face-to-face time during this virtual encounter): 23 minutes, spent in obtaining information about her symptoms, reviewing her previous labs, evaluations, and treatments, counseling her about her condition (please see the discussed topics above), and developing a plan to further investigate it; the patient was provided an opportunity to ask questions and all were answered. The patient agreed with the plan and demonstrated an understanding of the instructions.   The patient was advised to call back or seek an in-person evaluation if the symptoms worsen or if the condition fails to improve as anticipated.  Follow up: Return in about 6 weeks (around 01/02/2019) for follow up of diabetes and hypertension and chf.  Visit date not found  No orders of the defined types were placed in this encounter.  Meds ordered this encounter  Medications  . furosemide (LASIX) 40 MG tablet    Sig: Take 0.5 tablets (20 mg total) by mouth daily.    Dispense:  30 tablet    Refill:  0     I reviewed the patients updated PMH, FH, and SocHx.    Patient Active Problem List   Diagnosis Date Noted  . Chronic heart failure with preserved ejection fraction ([place]) 09/05/2018    Priority: High  . H/O: upper GI bleed 09/05/2018    Priority: High  . Type 2 diabetes mellitus ([place]) 07/26/2018    Priority: High  . Malignant hypertension 07/26/2018    Priority: High  . Pulmonary hypertension ([place]) 08/27/2015    Priority: High  . Hyperlipidemia 01/23/2011    Priority: High  . Iron deficiency anemia 08/22/2018    Priority: Medium  . Hearing loss 08/27/2015    Priority: Medium  . Asthma 08/27/2015    Priority: Medium  . Acute on chronic diastolic CHF (congestive heart failure) ([place]) 07/31/2018  . Community acquired pneumonia of right lower lobe of lung ([place])   . Hyponatremia 11/20/2017   Current Meds  Medication Sig  . albuterol (PROVENTIL HFA;VENTOLIN HFA) 108 (90 Base) MCG/ACT inhaler Inhale 2 puffs  into the lungs every 6 (six) hours as needed for wheezing or shortness of breath.   Marland Kitchen amLODipine (NORVASC) 10 MG tablet Take 10 mg by mouth daily.  . calcium carbonate (TUMS - DOSED IN MG ELEMENTAL CALCIUM) 500 MG chewable tablet Chew 1-2 tablets by mouth daily as needed for indigestion or heartburn.   . Calcium Carbonate-Vitamin D (CALCIUM 600 + D PO) Take 1 tablet by mouth daily.    . carvedilol (COREG) 25 MG tablet TAKE 1 TABLET BY MOUTH TWICE A DAY (Patient taking differently: Take 25 mg by mouth 2 (two) times daily with a meal. )  . cholecalciferol (VITAMIN D) 1000 UNITS tablet Take 1,000 Units by mouth daily.    Marland Kitchen doxazosin (CARDURA) 4 MG tablet Take 1  tablet (4 mg total) by mouth at bedtime.  . fenofibrate 160 MG tablet TAKE 1 TABLET BY MOUTH EVERY DAY  . ferrous sulfate 325 (65 FE) MG tablet Take 325 mg by mouth every other day.   . furosemide (LASIX) 40 MG tablet Take 0.5 tablets (20 mg total) by mouth daily.  . metFORMIN (GLUCOPHAGE) 1000 MG tablet Take 1,000 mg by mouth daily.   . pantoprazole (PROTONIX) 40 MG tablet Take 1 tablet (40 mg total) by mouth daily before breakfast.  . simvastatin (ZOCOR) 20 MG tablet Take 1 tablet (20 mg total) by mouth at bedtime.  . vitamin A 8000 UNIT capsule Take 8,000 Units by mouth daily.   . vitamin C (ASCORBIC ACID) 500 MG tablet Take 500 mg by mouth daily.    . [DISCONTINUED] furosemide (LASIX) 40 MG tablet Take 1 tablet (40 mg total) by mouth daily.    Allergies: Patient is allergic to tiazac [diltiazem hcl]. Family History: Patient family history includes Asthma in her daughter; Atrial fibrillation in her mother; Cancer in her daughter; Emphysema in her father; Hypertension in her mother; Stroke in her mother. Social History:  Patient  reports that she quit smoking about 44 years ago. Her smoking use included cigarettes. She has a 18.00 pack-year smoking history. She has never used smokeless tobacco. She reports that she does not drink alcohol  or use drugs.  Review of Systems: Constitutional: Negative for fever malaise or anorexia Cardiovascular: negative for chest pain Respiratory: negative for SOB or persistent cough Gastrointestinal: negative for abdominal pain

## 2018-11-21 NOTE — Assessment & Plan Note (Addendum)
Stable with lasix and BB. Weight is up a few pounds and unclear if related to fluid retention or not. Pt doesn't think it is fluid. She will work on diet and weight loss and we will assess in 6 weeks. Can adjust lasix dose back up at that time if needed.

## 2018-11-21 NOTE — Assessment & Plan Note (Signed)
Control remains fair by fasting sugars. Continue metformin and recheck in office with a1c in 6 weeks. Monitoring renal fxn on metformin

## 2018-11-21 NOTE — Assessment & Plan Note (Signed)
Will recheck at next office visit. Continue iron for now

## 2018-11-21 NOTE — Assessment & Plan Note (Signed)
Fairly well controlled. Continue current medications although may consider changing from amlodipine due to LE edema. Will reassess in person visit in 6 weeks. Document waits.

## 2018-11-23 ENCOUNTER — Other Ambulatory Visit: Payer: Self-pay

## 2018-11-23 NOTE — Patient Outreach (Signed)
[facility]) Care Management  11/23/2018  YATZIRY DEAKINS 12-26-1940 [id number]   Medication Adherence call to Mrs. Freeda Agudelo [facility] Identifiers Verify spoke with patient she is past due on Simvastatin 20 mg patient is still taking 1 tablet daily,she explain she was in the hospital for about a week and they provide it with her medications patient has medication for about 20 more days and will order when due. Mrs. Brocks is showing past due under [place].   [facility] Management Direct Dial [phone]  Fax [phone] [email or web address]

## 2018-12-04 ENCOUNTER — Other Ambulatory Visit: Payer: Self-pay | Admitting: Family Medicine

## 2018-12-04 DIAGNOSIS — E785 Hyperlipidemia, unspecified: Secondary | ICD-10-CM

## 2018-12-05 NOTE — Telephone Encounter (Signed)
Please advise, look like Cardiologist was refilling but she has not seen

## 2018-12-07 NOTE — Progress Notes (Signed)
Date:  12/08/2018   ID:  Carla Foster, DOB 11/06/1940, MRN [id number]  Patient Location: Home Provider Location: Office  PCP:  Leamon Arnt, MD  Cardiologist:   Nahser  Electrophysiologist:  None   Evaluation Performed:  Follow-Up Visit  Problems: 1. Hypertension 2. Hyperlipidemia 3. Diabetes mellitus   Carla Foster is a 78 year old female with a history of hypertension and hyperlipidemia. She also has a history of type 2 diabetes mellitus. She's been having problems with elevated blood pressure recently. I saw her in July for hypertension.  She feels fine. She's not had any episodes of chest pain or shortness breath. She continues to lose weight.  She has been told  to watch her salt intake. She used to use a lot of fried bologna.  January 17, 2013:  She is doing well.  No CP , no dyspnea.   Still eating salt.   March 09, 2014:  Carla Foster is doing well.  Walking regularly.  Hard of hearing - bought an expensive heaing aid that does not work well.   No CP or dyspnea.    Aug. 29, 2016:  Seen back today for HTN BP is a bit up today . Had a stomach virus yesterday - better today  Has not been eating any extra salt . No CP or dyspnea  Aug. 29, 2017:  Doing well.   Had some cataract surgery recently .  Stays active.   Mows with a push mower.  Tries to watch her salt   Dec.  6, 2018:     Seen for follow up of her HTN Has had lots of dental work  Does not avoid salty foods  - loves country ham - eats once a week,  Eats salty foods regularly , does not cook for herself  January 11, 2018:   Carla Foster is seen today for follow-up of her hypertension and hyperlipidemia.  Still eats some sausage and ham .    Taking and tolerating all her meds.  Very active.   Was in the hospital with bleeding ulcer .    Received 2 units PRBC. .  Was taking ASA and advil for back pain .  Has a cough - is on Lisinopril  Will change to Losartan    Chief Complaint:  Follow up CHF    History of Present Illness:    Carla Foster is a 78 y.o. female with chronic combined CHF. She was admitted in Jan. 2020 with community acquired pneumonia complicated by acute on chronic diastolic CHF .  Echo showed EF of 60-65%.  Grade 2 diastolic dysfunciton, mild AI,  Severe pulmonary HTN   Is having right leg swelling .  Worse in the eveing.  Better in the am  No swelling in left leg    The patient does not have symptoms concerning for COVID-19 infection (fever, chills, cough, or new shortness of breath).    Past Medical History:  Diagnosis Date  . Acute blood loss anemia   . AKI (acute kidney injury) ([place])   . Asthma   . Complication of anesthesia    nausea  . Diabetes mellitus    type 2  . Family history of upper GI bleeding 09/05/2018   Dr. Carlean Purl, hospitalized: egd 11/2017  . GERD (gastroesophageal reflux disease)   . GI bleed   . H/O: upper GI bleed 09/05/2018   11/2017, Peptic ulcer by EGD, Dr. Carlean Purl; hospitalized  . Hematemesis   . Hyperlipidemia   .  Hypertension   . Melena    Past Surgical History:  Procedure Laterality Date  . CERVICAL DISCECTOMY  08/23/00   C5-6, C6-7 anterior cervical diskectomy with fibular bone bank fusion followed by [place] anterior cervical plating with the operating microscope  --  SURGEON:  Faythe Ghee, M.D.  . ESOPHAGOGASTRODUODENOSCOPY (EGD) WITH PROPOFOL N/A 11/21/2017   Procedure: ESOPHAGOGASTRODUODENOSCOPY (EGD) WITH PROPOFOL;  Surgeon: Gatha Mayer, MD;  Location: [facility];  Service: Endoscopy;  Laterality: N/A;  . PARTIAL HYSTERECTOMY    . TEE WITHOUT CARDIOVERSION N/A 08/01/2018   Procedure: TRANSESOPHAGEAL ECHOCARDIOGRAM (TEE);  Surgeon: Skeet Latch, MD;  Location[facility] ENDOSCOPY;  Service: Cardiovascular;  Laterality: N/A;     Current Meds  Medication Sig  . albuterol (PROVENTIL HFA;VENTOLIN HFA) 108 (90 Base) MCG/ACT inhaler Inhale 2 puffs into the lungs every 6 (six) hours as needed for wheezing or  shortness of breath.   Marland Kitchen amLODipine (NORVASC) 10 MG tablet Take 10 mg by mouth daily.  . calcium carbonate (TUMS - DOSED IN MG ELEMENTAL CALCIUM) 500 MG chewable tablet Chew 1-2 tablets by mouth daily as needed for indigestion or heartburn.   . Calcium Carbonate-Vitamin D (CALCIUM 600 + D PO) Take 1 tablet by mouth daily.    . carvedilol (COREG) 25 MG tablet TAKE 1 TABLET BY MOUTH TWICE A DAY  . cholecalciferol (VITAMIN D) 1000 UNITS tablet Take 1,000 Units by mouth daily.    Marland Kitchen doxazosin (CARDURA) 4 MG tablet Take 1 tablet (4 mg total) by mouth at bedtime.  . fenofibrate 160 MG tablet TAKE 1 TABLET BY MOUTH EVERY DAY  . ferrous sulfate 325 (65 FE) MG tablet Take 325 mg by mouth every other day.   . furosemide (LASIX) 40 MG tablet Take 0.5 tablets (20 mg total) by mouth daily.  . metFORMIN (GLUCOPHAGE) 1000 MG tablet Take 1,000 mg by mouth daily.   . pantoprazole (PROTONIX) 40 MG tablet Take 1 tablet (40 mg total) by mouth daily before breakfast.  . Propylene Glycol (SYSTANE BALANCE) 0.6 % SOLN Apply 1 drop to eye as needed (dry eyes).  . simvastatin (ZOCOR) 20 MG tablet Take 1 tablet (20 mg total) by mouth at bedtime.  . vitamin A 8000 UNIT capsule Take 8,000 Units by mouth daily.   . vitamin C (ASCORBIC ACID) 500 MG tablet Take 500 mg by mouth daily.       Allergies:   Tiazac [diltiazem hcl]   Social History   Tobacco Use  . Smoking status: Former Smoker    Packs/day: 1.00    Years: 18.00    Pack years: 18.00    Types: Cigarettes    Last attempt to quit: 01/13/1974    Years since quitting: 44.9  . Smokeless tobacco: Never Used  Substance Use Topics  . Alcohol use: No  . Drug use: No     Family Hx: The patient's family history includes Asthma in her daughter; Atrial fibrillation in her mother; Cancer in her daughter; Emphysema in her father; Hypertension in her mother; Stroke in her mother.  ROS:   Please see the history of present illness.     All other systems reviewed and  are negative.   Prior CV studies:   The following studies were reviewed today:    Labs/Other Tests and Data Reviewed:    EKG:  No ECG reviewed.  Recent Labs: 07/25/2018: B Natriuretic Peptide 682.3 07/26/2018: Magnesium 1.5; TSH 0.933 08/22/2018: ALT 11; Hemoglobin 10.2; Platelets 319.0 09/05/2018: BUN 29;  Creatinine, Ser 1.09; Potassium 4.4; Sodium 138   Recent Lipid Panel Lab Results  Component Value Date/Time   CHOL 116 07/26/2018 03:04 AM   CHOL 131 01/11/2018 10:11 AM   TRIG 49 07/26/2018 03:04 AM   HDL 59 07/26/2018 03:04 AM   HDL 58 01/11/2018 10:11 AM   CHOLHDL 2.0 07/26/2018 03:04 AM   LDLCALC 47 07/26/2018 03:04 AM   LDLCALC 57 01/11/2018 10:11 AM    Wt Readings from Last 3 Encounters:  12/08/18 160 lb 3.2 oz (72.7 kg)  11/21/18 155 lb (70.3 kg)  09/05/18 152 lb 12.8 oz (69.3 kg)     Objective:    Vital Signs:  BP (!) 146/64   Pulse 67   Ht 5\' 6"  (1.676 m)   Wt 160 lb 3.2 oz (72.7 kg)   SpO2 97%   BMI 25.86 kg/m    Physical Exam: Blood pressure (!) 146/64, pulse 67, height 5\' 6"  (1.676 m), weight 160 lb 3.2 oz (72.7 kg), SpO2 97 %.  GEN:  Elderly female,  NAD  HEENT: Normal NECK: No JVD; No carotid bruits LYMPHATICS: No lymphadenopathy CARDIAC: RRR , soft systolic murmur  RESPIRATORY:  Clear to auscultation without rales, wheezing or rhonchi  ABDOMEN: Soft, non-tender, non-distended MUSCULOSKELETAL:  Trace left leg edema, 1-2 + edema in right leg  SKIN: Warm and dry NEUROLOGIC:  Alert and oriented x 3   ASSESSMENT & PLAN:    1. Leg edema - R >> L.,  Still eats some salty food.  EF is normal .  May be due to the high dose amlodipine. Will decrease the amlodpine to 5 mg a day and start Losartan 50 mg a day  Venous duplex to look for DVT   2. Chronic diastolic CHF -  Needs to avoid eating salt .   3. HTN:   Reduce amlodipine to 5 mg a day,  Start Losartan.   BMP in 3 weeks .  COVID-19 Education: The signs and symptoms of COVID-19 were discussed  with the patient and how to seek care for testing (follow up with PCP or arrange E-visit).  The importance of social distancing was discussed today.  Time:      Medication Adjustments/Labs and Tests Ordered: Current medicines are reviewed at length with the patient today.  Concerns regarding medicines are outlined above.   Tests Ordered: No orders of the defined types were placed in this encounter.   Medication Changes: No orders of the defined types were placed in this encounter.   Disposition:  Follow up in 6 month(s)  Signed, Mertie Moores, MD  12/08/2018 9:30 AM    [facility]

## 2018-12-08 ENCOUNTER — Encounter: Payer: Self-pay | Admitting: Cardiovascular Disease

## 2018-12-08 ENCOUNTER — Ambulatory Visit: Payer: Medicare Other | Admitting: Cardiovascular Disease

## 2018-12-08 ENCOUNTER — Other Ambulatory Visit: Payer: Self-pay

## 2018-12-08 VITALS — BP 146/64 | HR 67 | Ht 66.0 in | Wt 160.2 lb

## 2018-12-08 DIAGNOSIS — R600 Localized edema: Secondary | ICD-10-CM | POA: Diagnosis not present

## 2018-12-08 DIAGNOSIS — I5033 Acute on chronic diastolic (congestive) heart failure: Secondary | ICD-10-CM | POA: Diagnosis not present

## 2018-12-08 MED ORDER — AMLODIPINE BESYLATE 5 MG PO TABS: 5.0000 mg | ORAL_TABLET | Freq: Every day | ORAL | 3 refills | Status: DC

## 2018-12-08 MED ORDER — LOSARTAN POTASSIUM 50 MG PO TABS: 50.0000 mg | ORAL_TABLET | Freq: Every day | ORAL | 11 refills | Status: DC

## 2018-12-08 NOTE — Patient Instructions (Addendum)
Medication Instructions:  Your physician has recommended you make the following change in your medication:  DECREASE Amlodipine (Norvasc) to 5 mg START Losartan (Cozaar) 50 mg once daily  If you need a refill on your cardiac medications before your next appointment, please call your pharmacy.   Lab work: Your physician recommends that you return for lab work on Thursday June 11. You may come in anytime between 7:30 am and 4:30 pm. You do not have to fast for your blood work. Please call our office to reschedule if you cannot come in on this day.   Testing/Procedures: Your physician has requested that you have a lower or upper extremity venous duplex. This test is an ultrasound of the veins in the legs or arms. It looks at venous blood flow that carries blood from the heart to the legs or arms. Allow one hour for a Lower Venous exam. Allow thirty minutes for an Upper Venous exam. There are no restrictions or special instructions.    Follow-Up: At [facility], you and your health needs are our priority.  As part of our continuing mission to provide you with exceptional heart care, we have created designated Provider Care Teams.  These Care Teams include your primary Cardiologist (physician) and Advanced Practice Providers (APPs -  Physician Assistants and Nurse Practitioners) who all work together to provide you with the care you need, when you need it. You have a follow up appointment in:  3 months with Richardson Dopp, PA-C on Friday August 21 at 10:45 am Please call to reschedule if you are unable to keep this appointment

## 2018-12-09 ENCOUNTER — Encounter (HOSPITAL_COMMUNITY): Payer: Self-pay

## 2018-12-09 ENCOUNTER — Ambulatory Visit (HOSPITAL_COMMUNITY)
Admission: RE | Admit: 2018-12-09 | Discharge: 2018-12-09 | Disposition: A | Discharge status: Home / Self Care | Payer: Medicare Other | Source: Ambulatory Visit | Attending: Cardiology | Admitting: Cardiology

## 2018-12-09 DIAGNOSIS — I5033 Acute on chronic diastolic (congestive) heart failure: Secondary | ICD-10-CM | POA: Diagnosis not present

## 2018-12-09 DIAGNOSIS — R600 Localized edema: Secondary | ICD-10-CM | POA: Diagnosis not present

## 2018-12-09 NOTE — Progress Notes (Signed)
Bilateral lower venous has been completed and is negative for DVT. Preliminary results can be found under CV proc through chart review.  Horris Speros RVT [facility]  

## 2018-12-28 ENCOUNTER — Telehealth: Payer: Self-pay

## 2018-12-28 NOTE — Telephone Encounter (Signed)
    COVID-19 Pre-Screening Questions:  . In the past 7 to 10 days have you had a cough,  shortness of breath, headache, congestion, fever (100 or greater) body aches, chills, sore throat, or sudden loss of taste or sense of smell? . Have you been around anyone with known Covid 19. . Have you been around anyone who is awaiting Covid 19 test results in the past 7 to 10 days? . Have you been around anyone who has been exposed to Covid 19, or has mentioned symptoms of Covid 19 within the past 7 to 10 days?  If you have any concerns/questions about symptoms patients report during screening (either on the phone or at threshold). Contact the provider seeing the patient or DOD for further guidance.  If neither are available contact a member of the leadership team.          Pt answered No to all pre-screening questions. [id number] [id number] 12/28/2018

## 2018-12-29 ENCOUNTER — Other Ambulatory Visit: Payer: Self-pay

## 2018-12-29 ENCOUNTER — Other Ambulatory Visit: Payer: Medicare Other | Admitting: *Deleted

## 2018-12-29 DIAGNOSIS — R600 Localized edema: Secondary | ICD-10-CM

## 2018-12-29 DIAGNOSIS — I5033 Acute on chronic diastolic (congestive) heart failure: Secondary | ICD-10-CM

## 2018-12-29 LAB — BASIC METABOLIC PANEL WITH GFR
BUN/Creatinine Ratio: 30 — ABNORMAL HIGH (ref 12–28)
BUN: 35 mg/dL — ABNORMAL HIGH (ref 8–27)
CO2: 23 mmol/L (ref 20–29)
Calcium: 10.1 mg/dL (ref 8.7–10.3)
Chloride: 105 mmol/L (ref 96–106)
Creatinine, Ser: 1.16 mg/dL — ABNORMAL HIGH (ref 0.57–1.00)
GFR calc Af Amer: 52 mL/min/1.73 — ABNORMAL LOW
GFR calc non Af Amer: 45 mL/min/1.73 — ABNORMAL LOW
Glucose: 62 mg/dL — ABNORMAL LOW (ref 65–99)
Potassium: 5.1 mmol/L (ref 3.5–5.2)
Sodium: 142 mmol/L (ref 134–144)

## 2019-01-03 ENCOUNTER — Ambulatory Visit: Payer: Medicare Other | Admitting: Family Medicine

## 2019-01-06 ENCOUNTER — Other Ambulatory Visit: Payer: Self-pay

## 2019-01-06 ENCOUNTER — Ambulatory Visit (INDEPENDENT_AMBULATORY_CARE_PROVIDER_SITE_OTHER): Payer: Medicare Other | Admitting: Family Medicine

## 2019-01-06 ENCOUNTER — Encounter: Payer: Self-pay | Admitting: Family Medicine

## 2019-01-06 ENCOUNTER — Other Ambulatory Visit: Payer: Self-pay | Admitting: Cardiovascular Disease

## 2019-01-06 VITALS — BP 132/84 | HR 65 | Temp 98.1°F | Resp 16 | Ht 66.0 in | Wt 157.2 lb

## 2019-01-06 DIAGNOSIS — D508 Other iron deficiency anemias: Secondary | ICD-10-CM

## 2019-01-06 DIAGNOSIS — I5032 Chronic diastolic (congestive) heart failure: Secondary | ICD-10-CM

## 2019-01-06 DIAGNOSIS — E119 Type 2 diabetes mellitus without complications: Secondary | ICD-10-CM

## 2019-01-06 DIAGNOSIS — N183 Chronic kidney disease, stage 3 unspecified: Secondary | ICD-10-CM

## 2019-01-06 DIAGNOSIS — I10 Essential (primary) hypertension: Secondary | ICD-10-CM

## 2019-01-06 DIAGNOSIS — R600 Localized edema: Secondary | ICD-10-CM | POA: Diagnosis not present

## 2019-01-06 HISTORY — DX: Chronic kidney disease, stage 3 unspecified: N18.30

## 2019-01-06 LAB — CBC WITH DIFFERENTIAL/PLATELET
Basophils Absolute: 0.1 10*3/uL (ref 0.0–0.1)
Basophils Relative: 1 % (ref 0.0–3.0)
Eosinophils Absolute: 0.1 10*3/uL (ref 0.0–0.7)
Eosinophils Relative: 1.8 % (ref 0.0–5.0)
HCT: 31.8 % — ABNORMAL LOW (ref 36.0–46.0)
Hemoglobin: 10.6 g/dL — ABNORMAL LOW (ref 12.0–15.0)
Lymphocytes Relative: 18.8 % (ref 12.0–46.0)
Lymphs Abs: 1.2 10*3/uL (ref 0.7–4.0)
MCHC: 33.4 g/dL (ref 30.0–36.0)
MCV: 88.8 fl (ref 78.0–100.0)
Monocytes Absolute: 0.7 10*3/uL (ref 0.1–1.0)
Monocytes Relative: 11.1 % (ref 3.0–12.0)
Neutro Abs: 4.2 10*3/uL (ref 1.4–7.7)
Neutrophils Relative %: 67.3 % (ref 43.0–77.0)
Platelets: 259 10*3/uL (ref 150.0–400.0)
RBC: 3.58 Mil/uL — ABNORMAL LOW (ref 3.87–5.11)
RDW: 13.2 % (ref 11.5–15.5)
WBC: 6.3 10*3/uL (ref 4.0–10.5)

## 2019-01-06 LAB — COMPREHENSIVE METABOLIC PANEL
ALT: 12 U/L (ref 0–35)
AST: 19 U/L (ref 0–37)
Albumin: 3.9 g/dL (ref 3.5–5.2)
Alkaline Phosphatase: 26 U/L — ABNORMAL LOW (ref 39–117)
BUN: 35 mg/dL — ABNORMAL HIGH (ref 6–23)
CO2: 28 mEq/L (ref 19–32)
Calcium: 10.2 mg/dL (ref 8.4–10.5)
Chloride: 104 mEq/L (ref 96–112)
Creatinine, Ser: 1.21 mg/dL — ABNORMAL HIGH (ref 0.40–1.20)
GFR: 42.85 mL/min — ABNORMAL LOW (ref >60.00)
Glucose, Bld: 63 mg/dL — ABNORMAL LOW (ref 70–99)
Potassium: 4.8 mEq/L (ref 3.5–5.1)
Sodium: 139 mEq/L (ref 135–145)
Total Bilirubin: 0.4 mg/dL (ref 0.2–1.2)
Total Protein: 6.4 g/dL (ref 6.0–8.3)

## 2019-01-06 LAB — POCT GLYCOSYLATED HEMOGLOBIN (HGB A1C): Hemoglobin A1C: 6.9 % — AB (ref 4.0–5.6)

## 2019-01-06 NOTE — Patient Instructions (Signed)
Please return in 3 months for your physical: we will address some preventive things at that visit.   Please bring in your metformin bottle next visit so we can verify what you are taking.   No changes today in your medications.   If you have any questions or concerns, please don't hesitate to send me a message via MyChart or call the office at [phone]. Thank you for visiting with [place] today! It's our pleasure caring for you.

## 2019-01-06 NOTE — Progress Notes (Signed)
Subjective  CC:  Chief Complaint  Patient presents with  . Diabetes  . Hypertension  . Congestive Heart Failure    HPI: Carla MARTINDELCAMPO is a 78 y.o. female who presents to the office today for follow up of diabetes and problems listed above in the chief complaint.   Diabetes follow up: Her diabetic control is reported as Unchanged.  Taking metformin daily, denies symptoms of high blood sugars.  Recent BMP showed a glucose of 62 at the cardiologist office. She denies exertional CP or SOB or symptomatic hypoglycemia. She denies foot sores or paresthesias.  She is due immunizations.  Chronic diastolic heart failure: With persistent bilateral pedal edema however it is improved.  Recently, cardiology decrease dose of amlodipine.  She is taking 20 mg of Lasix daily.  Weight is stable.  Continues to eat salty food.  Creatinine is stable.  Chronic kidney disease: Stable on low-dose Lasix.  Chronic pulmonary hypertension: Also has asthma.  Using albuterol twice daily.  Hypertension: Changed to losartan due to ACE cough, decreased amlodipine.  Blood pressure remains well controlled.  No chest pain  Chronic anemia with history of iron deficiency anemia due to peptic ulcer bleed.  Due for follow-up.  No melena.  No upper GI symptoms.  Energy levels are fine.  Keeps active  Health maintenance: Due for complete physical, bone density scans, immunizations, etc. Wt Readings from Last 3 Encounters:  01/06/19 157 lb 3.2 oz (71.3 kg)  12/08/18 160 lb 3.2 oz (72.7 kg)  11/21/18 155 lb (70.3 kg)    BP Readings from Last 3 Encounters:  01/06/19 132/84  12/08/18 (!) 146/64  11/21/18 (!) 148/50    Assessment  1. Type 2 diabetes mellitus without complication, without long-term current use of insulin (HCC)   2. Other iron deficiency anemia   3. Malignant hypertension   4. Chronic heart failure with preserved ejection fraction (HCC)   5. Leg edema   6. CKD (chronic kidney disease) stage 3, GFR 30-59  ml/min (HCC)      Plan   Diabetes is currently very well controlled.  Will need to clarify which metformin formulation she is on.  Monitor for hypoglycemia.  No changes today.  Will update vaccinations at next visit  Recheck anemia levels.  Recheck iron deficiency levels.  She may have a component of chronic disease anemia  Malignant hypertension well-controlled with blood pressure medication changes.  Lower extremity edema and chronic diastolic heart failure: Well compensated.  Continue Lasix 20 mg daily.  Will tolerate mild lower extremity edema to keep kidney function stable.  Follow up: No follow-ups on file.. Orders Placed This Encounter  Procedures  . Comprehensive metabolic panel  . Iron, TIBC and Ferritin Panel  . CBC with Differential/Platelet  . POCT glycosylated hemoglobin (Hb A1C)   No orders of the defined types were placed in this encounter.     Immunization History  Administered Date(s) Administered  . Influenza, High Dose Seasonal PF 04/20/2016, 04/20/2017, 04/15/2018  . Pneumococcal Polysaccharide-23 05/09/2007  . Td 03/30/2007    Diabetes Related Lab Review: Lab Results  Component Value Date   HGBA1C 6.9 (A) 01/06/2019   HGBA1C 7.3 (H) 07/31/2018   HGBA1C 6.8 (H) 11/20/2017    No results found for: MICROALBUR, [id number] Lab Results  Component Value Date   CREATININE 1.16 (H) 12/29/2018   BUN 35 (H) 12/29/2018   NA 142 12/29/2018   K 5.1 12/29/2018   CL 105 12/29/2018   CO2 23  12/29/2018   Lab Results  Component Value Date   CHOL 116 07/26/2018   CHOL 131 01/11/2018   CHOL 133 03/17/2016   Lab Results  Component Value Date   HDL 59 07/26/2018   HDL 58 01/11/2018   HDL 67 03/17/2016   Lab Results  Component Value Date   LDLCALC 47 07/26/2018   LDLCALC 57 01/11/2018   LDLCALC 48 03/17/2016   Lab Results  Component Value Date   TRIG 49 07/26/2018   TRIG 78 01/11/2018   TRIG 89 03/17/2016   Lab Results  Component Value Date    CHOLHDL 2.0 07/26/2018   CHOLHDL 2.3 01/11/2018   CHOLHDL 2.0 03/17/2016   No results found for: LDLDIRECT The ASCVD Risk score Mikey Bussing DC Jr., et al., 2013) failed to calculate for the following reasons:   The valid total cholesterol range is 130 to 320 mg/dL I have reviewed the PMH, Fam and Soc history. Patient Active Problem List   Diagnosis Date Noted  . CKD (chronic kidney disease) stage 3, GFR 30-59 ml/min (HCC) 01/06/2019    Priority: High  . Chronic heart failure with preserved ejection fraction ([place]) 09/05/2018    Priority: High  . H/O: upper GI bleed 09/05/2018    Priority: High    11/2017, Peptic ulcer by EGD, Dr. Carlean Purl; hospitalized   . Type 2 diabetes mellitus ([place]) 07/26/2018    Priority: High  . Malignant hypertension 07/26/2018    Priority: High  . Pulmonary hypertension ([place]) 08/27/2015    Priority: High  . Hyperlipidemia 01/23/2011    Priority: High  . Iron deficiency anemia 08/22/2018    Priority: Medium    Due to peptic ulcer/UGI bleed hospitalized 11/2017; EGD by Dr. Carlean Purl at that time.    Marland Kitchen Hearing loss 08/27/2015    Priority: Medium  . Asthma 08/27/2015    Priority: Medium  . Leg edema 12/08/2018  . Acute on chronic diastolic CHF (congestive heart failure) ([place]) 07/31/2018  . Community acquired pneumonia of right lower lobe of lung ([place])   . Hyponatremia 11/20/2017    Social History: Patient  reports that she quit smoking about 45 years ago. Her smoking use included cigarettes. She has a 18.00 pack-year smoking history. She has never used smokeless tobacco. She reports that she does not drink alcohol or use drugs.  Review of Systems: Ophthalmic: negative for eye pain, loss of vision or double vision Cardiovascular: negative for chest pain Respiratory: negative for SOB or persistent cough Gastrointestinal: negative for abdominal pain Genitourinary: negative for dysuria or gross hematuria MSK: negative for foot lesions Neurologic: negative for  weakness or gait disturbance  Wt Readings from Last 3 Encounters:  01/06/19 157 lb 3.2 oz (71.3 kg)  12/08/18 160 lb 3.2 oz (72.7 kg)  11/21/18 155 lb (70.3 kg)    Objective  Vitals: BP 132/84   Pulse 65   Temp 98.1 F (36.7 C) (Oral)   Resp 16   Ht 5\' 6"  (1.676 m)   Wt 157 lb 3.2 oz (71.3 kg)   SpO2 97%   BMI 25.37 kg/m  General: well appearing, no acute distress  Psych:  Alert and oriented, normal mood and affect HEENT:  Normocephalic, atraumatic, moist mucous membranes, supple neck  Cardiovascular:  Nl S1 and S2, RRR with soft murmur, no gallop or rub. +1 edema to ankles bilaterally Respiratory:  Fair breath sounds bilaterally, CTAB with normal effort, no rales Gastrointestinal: normal BS, soft, nontender Skin:  Warm, no rashes Neurologic:  Mental status is normal. normal gait Foot exam: no erythema, pallor, or cyanosis visible nl proprioception and sensation to monofilament testing bilaterally, +2 distal pulses bilaterally    Diabetic education: ongoing education regarding chronic disease management for diabetes was given today. We continue to reinforce the ABC's of diabetic management: A1c (<7 or 8 dependent upon patient), tight blood pressure control, and cholesterol management with goal LDL < 100 minimally. We discuss diet strategies, exercise recommendations, medication options and possible side effects. At each visit, we review recommended immunizations and preventive care recommendations for diabetics and stress that good diabetic control can prevent other problems. See below for this patient's data.    Commons side effects, risks, benefits, and alternatives for medications and treatment plan prescribed today were discussed, and the patient expressed understanding of the given instructions. Patient is instructed to call or message via MyChart if he/she has any questions or concerns regarding our treatment plan. No barriers to understanding were identified. We discussed  Red Flag symptoms and signs in detail. Patient expressed understanding regarding what to do in case of urgent or emergency type symptoms.   Medication list was reconciled, printed and provided to the patient in AVS. Patient instructions and summary information was reviewed with the patient as documented in the AVS. This note was prepared with assistance of Dragon voice recognition software. Occasional wrong-word or sound-a-like substitutions may have occurred due to the inherent limitations of voice recognition software

## 2019-01-07 LAB — IRON,TIBC AND FERRITIN PANEL
%SAT: 17 % (calc) (ref 16–45)
Ferritin: 122 ng/mL (ref 16–288)
Iron: 66 ug/dL (ref 45–160)
TIBC: 381 mcg/dL (calc) (ref 250–450)

## 2019-01-10 ENCOUNTER — Encounter: Payer: Self-pay | Admitting: Family Medicine

## 2019-01-10 ENCOUNTER — Encounter: Payer: Self-pay | Admitting: *Deleted

## 2019-01-10 DIAGNOSIS — D638 Anemia in other chronic diseases classified elsewhere: Secondary | ICD-10-CM | POA: Insufficient documentation

## 2019-01-10 NOTE — Progress Notes (Signed)
Please call patient: I have reviewed his/her lab results. Stable labs: stable renal function and anemia of chronic disease. No medication changes needed at this time.

## 2019-01-28 ENCOUNTER — Other Ambulatory Visit: Payer: Self-pay | Admitting: Cardiovascular Disease

## 2019-03-09 NOTE — Progress Notes (Signed)
Cardiology Office Note:    Date:  03/10/2019   ID:  Carla Foster, DOB 1940-09-28, MRN [id number]  PCP:  Leamon Arnt, MD  Cardiologist:  Mertie Moores, MD  Electrophysiologist:  None   Referring MD: Leamon Arnt, MD   Chief Complaint  Patient presents with  . Follow-up    CHF, HTN    History of Present Illness:    Carla Foster is a 78 y.o. female with:  HFpEF  Admx 07/2018 with vol overload in setting of pneumonia  Pulmonary Hypertension   PASP 80 by echocardiogram 07/2018 (in setting of pneumonia)  Hypertension   Hyperlipidemia   Diabetes mellitus 2  Hx of upper GI bleed 5/19  Carla Foster was last seen by Dr. Acie Fredrickson in 11/2018.  Amlodipine was reduced at that time due to leg swelling.   She returns for follow-up.  She is here alone.  She notes continued fatigue.  This has slowly improved since she was hospitalized in January.  She has not had orthopnea, paroxysmal nocturnal dyspnea.  Her leg swelling is improved.  She has not had chest pain.  She has chronic shortness of breath related to asthma.  This is unchanged.  She has not had syncope.  Prior CV studies:   The following studies were reviewed today:  TEE 08/01/2018 EF 55-60, no RWMA, trivial AI, mild MAC, mild MR (no AoV vegetation)  Echocardiogram 07/26/2018 EF 60-65, no RWMA, Gr 2 DD, mobile density on LVOT side of AoV, severe MaC, mild MR, mod to severe LAE, mod-severe TR, PASP 80  Past Medical History:  Diagnosis Date  . Acute blood loss anemia   . AKI (acute kidney injury) ([place])   . Asthma   . CKD (chronic kidney disease) stage 3, GFR 30-59 ml/min ([place]) 01/06/2019  . Complication of anesthesia    nausea  . Diabetes mellitus    type 2  . Family history of upper GI bleeding 09/05/2018   Dr. Carlean Purl, hospitalized: egd 11/2017  . GERD (gastroesophageal reflux disease)   . GI bleed   . H/O: upper GI bleed 09/05/2018   11/2017, Peptic ulcer by EGD, Dr. Carlean Purl; hospitalized  . Hematemesis   .  Hyperlipidemia   . Hypertension   . Melena    Surgical Hx: The patient  has a past surgical history that includes Partial hysterectomy; Cervical discectomy (08/23/00); Esophagogastroduodenoscopy (egd) with propofol (N/A, 11/21/2017); and TEE without cardioversion (N/A, 08/01/2018).   Current Medications: Current Meds  Medication Sig  . albuterol (PROVENTIL HFA;VENTOLIN HFA) 108 (90 Base) MCG/ACT inhaler Inhale 2 puffs into the lungs every 6 (six) hours as needed for wheezing or shortness of breath.   Marland Kitchen amLODipine (NORVASC) 5 MG tablet Take 1 tablet (5 mg total) by mouth daily.  . calcium carbonate (TUMS - DOSED IN MG ELEMENTAL CALCIUM) 500 MG chewable tablet Chew 1-2 tablets by mouth daily as needed for indigestion or heartburn.   . Calcium Carbonate-Vitamin D (CALCIUM 600 + D PO) Take 1 tablet by mouth daily.    . carvedilol (COREG) 25 MG tablet TAKE 1 TABLET BY MOUTH TWICE A DAY  . cholecalciferol (VITAMIN D) 1000 UNITS tablet Take 1,000 Units by mouth daily.    Marland Kitchen doxazosin (CARDURA) 4 MG tablet TAKE 1 TABLET (4 MG TOTAL) BY MOUTH AT BEDTIME.  . fenofibrate 160 MG tablet TAKE 1 TABLET BY MOUTH EVERY DAY  . ferrous sulfate 325 (65 FE) MG tablet Take 325 mg by mouth every other day.   Marland Kitchen  furosemide (LASIX) 40 MG tablet Take 0.5 tablets (20 mg total) by mouth daily.  Marland Kitchen losartan (COZAAR) 50 MG tablet Take 1 tablet (50 mg total) by mouth daily.  . metFORMIN (GLUCOPHAGE) 1000 MG tablet Take 1,000 mg by mouth daily.   . Misc Natural Products (OSTEO BI-FLEX JOINT SHIELD PO) Take by mouth.  . pantoprazole (PROTONIX) 40 MG tablet Take 1 tablet (40 mg total) by mouth daily before breakfast.  . Propylene Glycol (SYSTANE BALANCE) 0.6 % SOLN Apply 1 drop to eye as needed (dry eyes).  . simvastatin (ZOCOR) 20 MG tablet Take 1 tablet (20 mg total) by mouth at bedtime.  . vitamin A 8000 UNIT capsule Take 8,000 Units by mouth daily.   . vitamin C (ASCORBIC ACID) 500 MG tablet Take 500 mg by mouth daily.        Allergies:   Tiazac [diltiazem hcl]   Social History   Tobacco Use  . Smoking status: Former Smoker    Packs/day: 1.00    Years: 18.00    Pack years: 18.00    Types: Cigarettes    Quit date: 01/13/1974    Years since quitting: 45.1  . Smokeless tobacco: Never Used  Substance Use Topics  . Alcohol use: No  . Drug use: No     Family Hx: The patient's family history includes Asthma in her daughter; Atrial fibrillation in her mother; Cancer in her daughter; Emphysema in her father; Hypertension in her mother; Stroke in her mother.  ROS:   Please see the history of present illness.    ROS All other systems reviewed and are negative.   EKGs/Labs/Other Test Reviewed:    EKG:  EKG is   ordered today.  The ekg ordered today demonstrates normal sinus rhythm, heart rate 66, normal axis, nonspecific ST-T wave changes, QTC 421, similar to prior tracings.  Recent Labs: 07/25/2018: B Natriuretic Peptide 682.3 07/26/2018: Magnesium 1.5; TSH 0.933 01/06/2019: ALT 12; BUN 35; Creatinine, Ser 1.21; Hemoglobin 10.6; Platelets 259.0; Potassium 4.8; Sodium 139   Recent Lipid Panel Lab Results  Component Value Date/Time   CHOL 116 07/26/2018 03:04 AM   CHOL 131 01/11/2018 10:11 AM   TRIG 49 07/26/2018 03:04 AM   HDL 59 07/26/2018 03:04 AM   HDL 58 01/11/2018 10:11 AM   CHOLHDL 2.0 07/26/2018 03:04 AM   LDLCALC 47 07/26/2018 03:04 AM   LDLCALC 57 01/11/2018 10:11 AM    Physical Exam:    VS:  BP (!) 154/66   Pulse 66   Ht _0  (1.676 m)   Wt 155 lb (70.3 kg)   BMI 25.02 kg/m     Wt Readings from Last 3 Encounters:  03/10/19 155 lb (70.3 kg)  01/06/19 157 lb 3.2 oz (71.3 kg)  12/08/18 160 lb 3.2 oz (72.7 kg)     Physical Exam  Constitutional: She is oriented to person, place, and time. She appears well-developed and well-nourished. No distress.  HENT:  Head: Normocephalic and atraumatic.  Eyes: No scleral icterus.  Neck: No JVD present. No thyromegaly present.   Cardiovascular: Normal rate and regular rhythm.  Murmur heard.  Systolic murmur is present with a grade of 2/6 at the upper right sternal border. Pulmonary/Chest: Effort normal. She has decreased breath sounds. She has no rales.  Abdominal: Soft. There is no hepatomegaly.  Musculoskeletal:        General: Edema (trace ankle edema bilaterally) present.  Lymphadenopathy:    She has no cervical adenopathy.  Neurological: She  is alert and oriented to person, place, and time.  Skin: Skin is warm and dry.  Psychiatric: She has a normal mood and affect.    ASSESSMENT & PLAN:    1. Chronic heart failure with preserved ejection fraction (HCC) Echo 1/20 with EF 60-65 and moderate diastolic dysfunction.  Overall, her volume status appears to be stable.  Her leg swelling has improved with reducing her amlodipine.  She is NYHA 2.  Continue current therapy.  2. Pulmonary hypertension (HCC) PASP was 80 at the time of her echocardiogram in January 2020.  This was in the setting of pneumonia and known asthma.  I suspect that this was related to her acute illness.  I will make sure she has a repeat echocardiogram arranged prior to her next visit in 6 months.  3. Essential hypertension Blood pressure is above target.  I have asked her to monitor her blood pressure over the next 2 weeks and send me those readings for review.  If her blood pressure remains >130/80, I will increase her losartan to 100 mg daily.  4. Hyperlipidemia, unspecified hyperlipidemia type Continue statin therapy.     Dispo:  Return in about 6 months (around 09/10/2019) for Routine Follow Up, w/ Dr. Acie Fredrickson, or Richardson Dopp, PA-C, (virtual or in-person).   Medication Adjustments/Labs and Tests Ordered: Current medicines are reviewed at length with the patient today.  Concerns regarding medicines are outlined above.  Tests Ordered: Orders Placed This Encounter  Procedures  . EKG 12-Lead  . ECHOCARDIOGRAM COMPLETE   Medication  Changes: No orders of the defined types were placed in this encounter.   Signed, Richardson Dopp, PA-C  03/10/2019 11:38 AM    [facility] [place], [place],   [address] Phone: [phone]; Fax: [phone]

## 2019-03-10 ENCOUNTER — Ambulatory Visit (INDEPENDENT_AMBULATORY_CARE_PROVIDER_SITE_OTHER): Payer: Medicare Other | Admitting: Physician Assistant

## 2019-03-10 ENCOUNTER — Encounter (INDEPENDENT_AMBULATORY_CARE_PROVIDER_SITE_OTHER): Payer: Self-pay

## 2019-03-10 ENCOUNTER — Other Ambulatory Visit: Payer: Self-pay

## 2019-03-10 ENCOUNTER — Encounter: Payer: Self-pay | Admitting: Physician Assistant

## 2019-03-10 VITALS — BP 154/66 | HR 66 | Ht 66.0 in | Wt 155.0 lb

## 2019-03-10 DIAGNOSIS — I5032 Chronic diastolic (congestive) heart failure: Secondary | ICD-10-CM | POA: Diagnosis not present

## 2019-03-10 DIAGNOSIS — I110 Hypertensive heart disease with heart failure: Secondary | ICD-10-CM | POA: Diagnosis not present

## 2019-03-10 DIAGNOSIS — E785 Hyperlipidemia, unspecified: Secondary | ICD-10-CM | POA: Diagnosis not present

## 2019-03-10 DIAGNOSIS — I272 Pulmonary hypertension, unspecified: Secondary | ICD-10-CM | POA: Diagnosis not present

## 2019-03-10 DIAGNOSIS — I10 Essential (primary) hypertension: Secondary | ICD-10-CM

## 2019-03-10 NOTE — Patient Instructions (Signed)
Medication Instructions:  No changes  If you need a refill on your cardiac medications before your next appointment, please call your pharmacy.   Lab work: None   If you have labs (blood work) drawn today and your tests are completely normal, you will receive your results only by: Marland Kitchen MyChart Message (if you have MyChart) OR . A paper copy in the mail If you have any lab test that is abnormal or we need to change your treatment, we will call you to review the results.  Testing/Procedures: Schedule an Echocardiogram in 6 months (do not schedule before Jan 2021)  Follow-Up: At [facility], you and your health needs are our priority.  As part of our continuing mission to provide you with exceptional heart care, we have created designated Provider Care Teams.  These Care Teams include your primary Cardiologist (physician) and Advanced Practice Providers (APPs -  Physician Assistants and Nurse Practitioners) who all work together to provide you with the care you need, when you need it. You will need a follow up appointment in:  6 months.  Please call our office 2 months in advance to schedule this appointment.  You may see Mertie Moores, MD or Richardson Dopp, PA-C   Any Other Special Instructions Will Be Listed Below (If Applicable).  Check blood pressure once a day for 2 weeks and send those readings to me for review.  You can call the office to give to me or you can mail it.  We want your blood pressure to be less than 130/80 on average.  If it is above this, I will increase your medication.

## 2019-04-10 ENCOUNTER — Ambulatory Visit (INDEPENDENT_AMBULATORY_CARE_PROVIDER_SITE_OTHER): Payer: Medicare Other | Admitting: Family Medicine

## 2019-04-10 ENCOUNTER — Encounter: Payer: Self-pay | Admitting: Family Medicine

## 2019-04-10 ENCOUNTER — Other Ambulatory Visit: Payer: Self-pay

## 2019-04-10 VITALS — BP 132/72 | HR 72 | Temp 97.9°F | Resp 16 | Ht 66.0 in | Wt 158.2 lb

## 2019-04-10 DIAGNOSIS — E119 Type 2 diabetes mellitus without complications: Secondary | ICD-10-CM | POA: Diagnosis not present

## 2019-04-10 DIAGNOSIS — I5032 Chronic diastolic (congestive) heart failure: Secondary | ICD-10-CM | POA: Diagnosis not present

## 2019-04-10 DIAGNOSIS — Z23 Encounter for immunization: Secondary | ICD-10-CM | POA: Diagnosis not present

## 2019-04-10 DIAGNOSIS — N183 Chronic kidney disease, stage 3 unspecified: Secondary | ICD-10-CM

## 2019-04-10 DIAGNOSIS — D638 Anemia in other chronic diseases classified elsewhere: Secondary | ICD-10-CM

## 2019-04-10 DIAGNOSIS — Z0000 Encounter for general adult medical examination without abnormal findings: Secondary | ICD-10-CM

## 2019-04-10 DIAGNOSIS — I10 Essential (primary) hypertension: Secondary | ICD-10-CM

## 2019-04-10 DIAGNOSIS — E2839 Other primary ovarian failure: Secondary | ICD-10-CM

## 2019-04-10 DIAGNOSIS — Z1231 Encounter for screening mammogram for malignant neoplasm of breast: Secondary | ICD-10-CM

## 2019-04-10 LAB — CBC WITH DIFFERENTIAL/PLATELET
Basophils Absolute: 0.1 10*3/uL (ref 0.0–0.1)
Basophils Relative: 0.9 % (ref 0.0–3.0)
Eosinophils Absolute: 0.1 10*3/uL (ref 0.0–0.7)
Eosinophils Relative: 0.9 % (ref 0.0–5.0)
HCT: 29.2 % — ABNORMAL LOW (ref 36.0–46.0)
Hemoglobin: 10 g/dL — ABNORMAL LOW (ref 12.0–15.0)
Lymphocytes Relative: 15.6 % (ref 12.0–46.0)
Lymphs Abs: 1.3 10*3/uL (ref 0.7–4.0)
MCHC: 34.4 g/dL (ref 30.0–36.0)
MCV: 90.4 fl (ref 78.0–100.0)
Monocytes Absolute: 0.6 10*3/uL (ref 0.1–1.0)
Monocytes Relative: 7 % (ref 3.0–12.0)
Neutro Abs: 6.5 10*3/uL (ref 1.4–7.7)
Neutrophils Relative %: 75.6 % (ref 43.0–77.0)
Platelets: 251 10*3/uL (ref 150.0–400.0)
RBC: 3.23 Mil/uL — ABNORMAL LOW (ref 3.87–5.11)
RDW: 13.1 % (ref 11.5–15.5)
WBC: 8.6 10*3/uL (ref 4.0–10.5)

## 2019-04-10 LAB — COMPREHENSIVE METABOLIC PANEL
ALT: 11 U/L (ref 0–35)
AST: 21 U/L (ref 0–37)
Albumin: 4 g/dL (ref 3.5–5.2)
Alkaline Phosphatase: 22 U/L — ABNORMAL LOW (ref 39–117)
BUN: 44 mg/dL — ABNORMAL HIGH (ref 6–23)
CO2: 30 mEq/L (ref 19–32)
Calcium: 10.8 mg/dL — ABNORMAL HIGH (ref 8.4–10.5)
Chloride: 102 mEq/L (ref 96–112)
Creatinine, Ser: 1.18 mg/dL (ref 0.40–1.20)
GFR: 44.08 mL/min — ABNORMAL LOW (ref >60.00)
Glucose, Bld: 110 mg/dL — ABNORMAL HIGH (ref 70–99)
Potassium: 4.6 mEq/L (ref 3.5–5.1)
Sodium: 139 mEq/L (ref 135–145)
Total Bilirubin: 0.3 mg/dL (ref 0.2–1.2)
Total Protein: 6.5 g/dL (ref 6.0–8.3)

## 2019-04-10 LAB — LIPID PANEL
Cholesterol: 139 mg/dL (ref 0–200)
HDL: 61.1 mg/dL (ref >39.00)
LDL Cholesterol: 62 mg/dL (ref 0–99)
NonHDL: 77.68
Total CHOL/HDL Ratio: 2
Triglycerides: 77 mg/dL (ref 0.0–149.0)
VLDL: 15.4 mg/dL (ref 0.0–40.0)

## 2019-04-10 LAB — POCT GLYCOSYLATED HEMOGLOBIN (HGB A1C): Hemoglobin A1C: 6.7 % — AB (ref 4.0–5.6)

## 2019-04-10 LAB — TSH: TSH: 2.35 u[IU]/mL (ref 0.35–4.50)

## 2019-04-10 MED ORDER — PREVNAR 13 IM SUSP: 0.5000 mL | Freq: Once | INTRAMUSCULAR | 0 refills | Status: AC

## 2019-04-10 MED ORDER — FERROUS SULFATE 325 (65 FE) MG PO TABS: 325.0000 mg | ORAL_TABLET | Freq: Every day | ORAL | 3 refills | Status: DC

## 2019-04-10 NOTE — Progress Notes (Signed)
Please call patient: I have reviewed his/her lab results. Lab results are all stable. No new recommendations needed at this time.   (Will monitor calcium, hgb, renal function. Lipids are at goal.)

## 2019-04-10 NOTE — Patient Instructions (Signed)
Please return in 3 months for diabetes and blood pressure follow up Medicare recommends an Annual Wellness Visit for all patients. Please schedule this to be done with our Nurse Educator, Loma Sousa. This is an informative "talk" visit; it's goals are to ensure that your health care needs are being met and to give you education regarding avoiding falls, ensuring you are not suffering from depression or problems with memory or thinking, and to educate you on Advance Care Planning. It helps me take good care of you!  Please check your blood pressures daily and keep a log.  Let me know if your readings are > 140/90 and I will adjust your blood pressure medication.   Today you were given your flu vaccination. Please take the RX for the Prevnar vaccination for your last pneumonia shot.   We will call you with information regarding your referral appointment. Mammogram and Bone density test.  If you do not hear from [place] within the next 2 weeks, please let me know.   If you have any questions or concerns, please don't hesitate to send me a message via MyChart or call the office at [phone]. Thank you for visiting with [place] today! It's our pleasure caring for you.

## 2019-04-10 NOTE — Progress Notes (Signed)
Subjective  Chief Complaint  Patient presents with  . Annual Exam    Had a boiled egg   . Diabetes  . Hyperlipidemia  . Hypertension    HPI: Carla Foster is a 78 y.o. female who presents to [facility] [place] today for a Female Wellness Visit. She also has the concerns and/or needs as listed above in the chief complaint. These will be addressed in addition to the Health Maintenance Visit.   Wellness Visit: annual visit with health maintenance review and exam   HM: due for mammogram and dexa. Flu shot and Prevnar. Not fasting. Doing ok. Still fatigued. Tries to keep active Chronic disease f/u and/or acute problem visit: (deemed necessary to be done in addition to the wellness visit):  CHF and HTN: reviewed recent cards notes. CHF is compensated. Stopped ace due to cough: started low dose losartan and decreased amlodipine due to LE edema. Pt reports home bps are normal: 130s/ 60s.. today elevated due to rushing and having to use bathroom. Denies cp. Tolerating meds.   Monitoring renal function and anemia.   Diabetes follow up: Her diabetic control is reported as Unchanged. Doing fine w/o sxs of hyperglycemia or problems with meds. On metformin: has ckd.  She denies exertional CP or SOB or symptomatic hypoglycemia. She denies foot sores or paresthesias. Immunization History  Administered Date(s) Administered  . Fluad Quad(high Dose 65+) 04/10/2019  . Influenza, High Dose Seasonal PF 04/20/2016, 04/20/2017, 04/15/2018  . Pneumococcal Polysaccharide-23 05/09/2007  . Td 03/30/2007    Diabetes Related Lab Review: Lab Results  Component Value Date   HGBA1C 6.7 (A) 04/10/2019   HGBA1C 6.9 (A) 01/06/2019   HGBA1C 7.3 (H) 07/31/2018    No results found for: Derl Barrow Lab Results  Component Value Date   CREATININE 1.21 (H) 01/06/2019   BUN 35 (H) 01/06/2019   NA 139 01/06/2019   K 4.8 01/06/2019   CL 104 01/06/2019   CO2 28 01/06/2019   Lab  Results  Component Value Date   CHOL 116 07/26/2018   CHOL 131 01/11/2018   CHOL 133 03/17/2016   Lab Results  Component Value Date   HDL 59 07/26/2018   HDL 58 01/11/2018   HDL 67 03/17/2016   Lab Results  Component Value Date   LDLCALC 47 07/26/2018   LDLCALC 57 01/11/2018   LDLCALC 48 03/17/2016   Lab Results  Component Value Date   TRIG 49 07/26/2018   TRIG 78 01/11/2018   TRIG 89 03/17/2016   Lab Results  Component Value Date   CHOLHDL 2.0 07/26/2018   CHOLHDL 2.3 01/11/2018   CHOLHDL 2.0 03/17/2016   No results found for: LDLDIRECT The ASCVD Risk score Mikey Bussing DC Jr., et al., 2013) failed to calculate for the following reasons:   The valid total cholesterol range is 130 to 320 mg/dL  BP Readings from Last 3 Encounters:  04/10/19 132/72  03/10/19 (!) 154/66  01/06/19 132/84   Wt Readings from Last 3 Encounters:  04/10/19 158 lb 3.2 oz (71.8 kg)  03/10/19 155 lb (70.3 kg)  01/06/19 157 lb 3.2 oz (71.3 kg)    Health Maintenance  Topic Date Due  . FOOT EXAM  05/11/1951  . DEXA SCAN  05/11/2004  . PNA vac Low Risk Adult (2 of 2 - PCV13) 05/08/2008  . TETANUS/TDAP  03/29/2017  . INFLUENZA VACCINE  02/18/2019  . OPHTHALMOLOGY EXAM  07/05/2019  . HEMOGLOBIN A1C  07/08/2019  Assessment  1. Annual physical exam   2. CKD (chronic kidney disease) stage 3, GFR 30-59 ml/min (HCC)   3. Anemia of chronic disease   4. Malignant hypertension   5. Chronic heart failure with preserved ejection fraction (HCC)   6. Type 2 diabetes mellitus without complication, without long-term current use of insulin (HCC)   7. Encounter for screening mammogram for breast cancer   8. Hypoestrogenism   9. Need for immunization against influenza      Plan  Female Wellness Visit:  Age appropriate Health Maintenance and Prevention measures were discussed with patient. Included topics are cancer screening recommendations, ways to keep healthy (see AVS) including dietary and  exercise recommendations, regular eye and dental care, use of seat belts, and avoidance of moderate alcohol use and tobacco use. mammo and dexa ordered.   BMI: discussed patient's BMI and encouraged positive lifestyle modifications to help get to or maintain a target BMI.  HM needs and immunizations were addressed and ordered. See below for orders. See HM and immunization section for updates. Flu today and recommend prevnar.  Routine labs and screening tests ordered including cmp, cbc and lipids where appropriate.  Discussed recommendations regarding Vit D and calcium supplementation (see AVS)  Chronic disease management visit and/or acute problem visit:  DM: good control. No med change. Monitor renal funcion on met. Flu shot today. On arb.   Lipids - on statin. Check today. Nonfasting.   HTN: good control reportedly; will start checking home readings again to ensure good control. IF elevated, will increase arb dose. See avs.   CKD: recheck today and monitoring anemia due to h/o GI bleed and fatigue. Has been improved. Increase iron to daily dosing.   CHF per cards.   Follow up: No follow-ups on file.  Orders Placed This Encounter  Procedures  . DG Bone Density  . MM DIGITAL SCREENING BILATERAL  . Flu Vaccine QUAD High Dose(Fluad)  . Comprehensive metabolic panel  . Lipid panel  . TSH  . CBC with Differential/Platelet  . POCT glycosylated hemoglobin (Hb A1C)   Meds ordered this encounter  Medications  . pneumococcal 13-valent conjugate vaccine (PREVNAR 13) SUSP injection    Sig: Inject 0.5 mLs into the muscle once for 1 dose.    Dispense:  0.5 mL    Refill:  0  . ferrous sulfate 325 (65 FE) MG tablet    Sig: Take 1 tablet (325 mg total) by mouth daily.    Dispense:  90 tablet    Refill:  3      Lifestyle: Body mass index is 25.53 kg/m. Wt Readings from Last 3 Encounters:  04/10/19 158 lb 3.2 oz (71.8 kg)  03/10/19 155 lb (70.3 kg)  01/06/19 157 lb 3.2 oz (71.3 kg)      Patient Active Problem List   Diagnosis Date Noted  . Anemia of chronic disease 01/10/2019    Priority: High    12/2018 labs   . CKD (chronic kidney disease) stage 3, GFR 30-59 ml/min (HCC) 01/06/2019    Priority: High  . Chronic heart failure with preserved ejection fraction ([place]) 09/05/2018    Priority: High  . H/O: upper GI bleed 09/05/2018    Priority: High    11/2017, Peptic ulcer by EGD, Dr. Carlean Purl; hospitalized   . Type 2 diabetes mellitus ([place]) 07/26/2018    Priority: High  . Malignant hypertension 07/26/2018    Priority: High  . Pulmonary hypertension ([place]) 08/27/2015    Priority: High  .  Hyperlipidemia 01/23/2011    Priority: High  . Iron deficiency anemia 08/22/2018    Priority: Medium    Due to peptic ulcer/UGI bleed hospitalized 11/2017; EGD by Dr. Carlean Purl at that time.    Marland Kitchen Hearing loss 08/27/2015    Priority: Medium  . Asthma 08/27/2015    Priority: Medium  . Leg edema 12/08/2018  . Acute on chronic diastolic CHF (congestive heart failure) ([place]) 07/31/2018  . Community acquired pneumonia of right lower lobe of lung ([place])   . Hyponatremia 11/20/2017   Health Maintenance  Topic Date Due  . FOOT EXAM  05/11/1951  . DEXA SCAN  05/11/2004  . PNA vac Low Risk Adult (2 of 2 - PCV13) 05/08/2008  . TETANUS/TDAP  03/29/2017  . INFLUENZA VACCINE  02/18/2019  . OPHTHALMOLOGY EXAM  07/05/2019  . HEMOGLOBIN A1C  07/08/2019   Immunization History  Administered Date(s) Administered  . Fluad Quad(high Dose 65+) 04/10/2019  . Influenza, High Dose Seasonal PF 04/20/2016, 04/20/2017, 04/15/2018  . Pneumococcal Polysaccharide-23 05/09/2007  . Td 03/30/2007   We updated and reviewed the patient's past history in detail and it is documented below. Allergies: Patient is allergic to tiazac [diltiazem hcl]. Past Medical History Patient  has a past medical history of Acute blood loss anemia, AKI (acute kidney injury) ([place]), Asthma, CKD (chronic kidney disease) stage  3, GFR 30-59 ml/min (HCC) (2/95/1884), Complication of anesthesia, Diabetes mellitus, Family history of upper GI bleeding (09/05/2018), GERD (gastroesophageal reflux disease), GI bleed, H/O: upper GI bleed (09/05/2018), Hematemesis, Hyperlipidemia, Hypertension, and Melena. Past Surgical History Patient  has a past surgical history that includes Partial hysterectomy; Cervical discectomy (08/23/00); Esophagogastroduodenoscopy (egd) with propofol (N/A, 11/21/2017); and TEE without cardioversion (N/A, 08/01/2018). Family History: Patient family history includes Asthma in her daughter; Atrial fibrillation in her mother; Cancer in her daughter; Emphysema in her father; Hypertension in her mother; Stroke in her mother. Social History:  Patient  reports that she quit smoking about 45 years ago. Her smoking use included cigarettes. She has a 18.00 pack-year smoking history. She has never used smokeless tobacco. She reports that she does not drink alcohol or use drugs.  Review of Systems: Constitutional: negative for fever or malaise, +fatigue Ophthalmic: negative for photophobia, double vision or loss of vision Cardiovascular: negative for chest pain, dyspnea on exertion, or new LE swelling Respiratory: negative for SOB or persistent cough Gastrointestinal: negative for abdominal pain, change in bowel habits or melena Genitourinary: negative for dysuria or gross hematuria, no abnormal uterine bleeding or disharge Musculoskeletal: negative for new gait disturbance or muscular weakness Integumentary: negative for new or persistent rashes, no breast lumps Neurological: negative for TIA or stroke symptoms Psychiatric: negative for SI or delusions Allergic/Immunologic: negative for hives  Patient Care Team    Relationship Specialty Notifications Start End  Leamon Arnt, MD PCP - General Family Medicine  08/18/18   Nahser, Wonda Cheng, MD PCP - Cardiology Cardiology Admissions 12/08/18   Juanita Craver, MD  Consulting Physician Gastroenterology  08/22/18   Mammography, [place]  Diagnostic Radiology  08/22/18     Objective  Vitals: BP 132/72 Comment: avg by 2 week home monitoring  Pulse 72   Temp 97.9 F (36.6 C) (Tympanic)   Resp 16   Ht '5\' 6"'$  (1.676 m)   Wt 158 lb 3.2 oz (71.8 kg)   SpO2 96%   BMI 25.53 kg/m  General:  Well developed, well nourished, no acute distress  Psych:  Alert and orientedx3,normal mood and affect  HEENT:  Normocephalic, atraumatic, non-icteric sclera, PERRL, oropharynx is clear without mass or exudate, supple neck without adenopathy, mass or thyromegaly Cardiovascular:  Normal S1, S2, RRR without gallop,+ systolic murmur, nondisplaced PMI Respiratory:  Good breath sounds bilaterally, CTAB with normal respiratory effort Gastrointestinal: normal bowel sounds, soft, non-tender, no noted masses. No HSM Skin:  Warm, no rashes or suspicious lesions noted Neurologic:    Mental status is normal. CN 2-11 are normal. Gross motor and sensory exams are normal. Normal gait. No tremor Diabetic Foot Exam: Appearance - no lesions, ulcers or calluses Skin - no sigificant pallor or erythema Monofilament testing - sensitive bilaterally in following locations:  Right - Great toe, medial, central, lateral ball and posterior foot intact  Left - Great toe, medial, central, lateral ball and posterior foot intact Pulses - +2 distally bilaterally    Commons side effects, risks, benefits, and alternatives for medications and treatment plan prescribed today were discussed, and the patient expressed understanding of the given instructions. Patient is instructed to call or message via MyChart if he/she has any questions or concerns regarding our treatment plan. No barriers to understanding were identified. We discussed Red Flag symptoms and signs in detail. Patient expressed understanding regarding what to do in case of urgent or emergency type symptoms.   Medication list was reconciled, printed  and provided to the patient in AVS. Patient instructions and summary information was reviewed with the patient as documented in the AVS. This note was prepared with assistance of Dragon voice recognition software. Occasional wrong-word or sound-a-like substitutions may have occurred due to the inherent limitations of voice recognition software

## 2019-04-18 ENCOUNTER — Telehealth: Payer: Self-pay | Admitting: Physician Assistant

## 2019-04-18 NOTE — Telephone Encounter (Signed)
I have reviewed the blood pressure readings she provided. Most of these are close to target. I recommend she remain on her current medications at their current doses. Richardson Dopp, PA-C    04/18/2019 5:07 PM

## 2019-04-19 NOTE — Telephone Encounter (Signed)
lvm regarding Carla Foster's recommendation's per (DPR).

## 2019-04-24 ENCOUNTER — Telehealth: Payer: Self-pay | Admitting: Family Medicine

## 2019-04-24 NOTE — Telephone Encounter (Signed)
Copied from [facility] [id number]. Topic: General - Inquiry >> Apr 24, 2019 11:11 AM Mathis Bud wrote: Reason for CRM: patient is requesting a call back from PCP or nurse regarding her bone density test. Call back [phone]  Called pt and scheduled bone density for 04/27/19.

## 2019-04-26 ENCOUNTER — Telehealth: Payer: Self-pay | Admitting: Family Medicine

## 2019-04-26 ENCOUNTER — Telehealth: Payer: Self-pay

## 2019-04-26 ENCOUNTER — Other Ambulatory Visit: Payer: Self-pay | Admitting: *Deleted

## 2019-04-26 DIAGNOSIS — E785 Hyperlipidemia, unspecified: Secondary | ICD-10-CM

## 2019-04-26 MED ORDER — DOXAZOSIN MESYLATE 4 MG PO TABS: 4.0000 mg | ORAL_TABLET | Freq: Every day | ORAL | 3 refills | Status: DC

## 2019-04-26 MED ORDER — LOSARTAN POTASSIUM 50 MG PO TABS: 50.0000 mg | ORAL_TABLET | Freq: Every day | ORAL | 3 refills | Status: DC

## 2019-04-26 MED ORDER — CARVEDILOL 25 MG PO TABS: 25.0000 mg | ORAL_TABLET | Freq: Two times a day (BID) | ORAL | 3 refills | Status: DC

## 2019-04-26 MED ORDER — AMLODIPINE BESYLATE 5 MG PO TABS: 5.0000 mg | ORAL_TABLET | Freq: Every day | ORAL | 3 refills | Status: DC

## 2019-04-26 MED ORDER — FENOFIBRATE 160 MG PO TABS: 160.0000 mg | ORAL_TABLET | Freq: Every day | ORAL | 3 refills | Status: DC

## 2019-04-26 MED ORDER — METFORMIN HCL 1000 MG PO TABS: 1000.0000 mg | ORAL_TABLET | Freq: Every day | ORAL | 3 refills | Status: DC

## 2019-04-26 MED ORDER — SIMVASTATIN 20 MG PO TABS: 20.0000 mg | ORAL_TABLET | Freq: Every day | ORAL | 3 refills | Status: DC

## 2019-04-26 NOTE — Telephone Encounter (Signed)
Suanne Marker, NP with [employer] did an annual home visit with patient and is reporting results of an at home test for PAD. Reporting any values lower than .9 Quantaflo   LLL  0.68                    RLL 0.63  Reports the patient denies any symptoms. Also stated the patient reported she cannot take ASA if needed.  Requested these results be sent to you.

## 2019-04-26 NOTE — Telephone Encounter (Signed)
Prescriptions sent to [facility].

## 2019-04-26 NOTE — Telephone Encounter (Signed)
See below

## 2019-04-27 ENCOUNTER — Encounter: Payer: Self-pay | Admitting: Family Medicine

## 2019-04-27 ENCOUNTER — Ambulatory Visit (INDEPENDENT_AMBULATORY_CARE_PROVIDER_SITE_OTHER)
Admission: RE | Admit: 2019-04-27 | Discharge: 2019-04-27 | Disposition: A | Discharge status: Home / Self Care | Payer: Medicare Other | Source: Ambulatory Visit | Attending: Family Medicine | Admitting: Family Medicine

## 2019-04-27 ENCOUNTER — Other Ambulatory Visit: Payer: Self-pay

## 2019-04-27 DIAGNOSIS — E2839 Other primary ovarian failure: Secondary | ICD-10-CM

## 2019-04-27 DIAGNOSIS — I739 Peripheral vascular disease, unspecified: Secondary | ICD-10-CM | POA: Insufficient documentation

## 2019-04-27 HISTORY — DX: Peripheral vascular disease, unspecified: I73.9

## 2019-04-27 NOTE — Telephone Encounter (Signed)
Noted; asymptomatic PAD. Can't tolerate aspirin. On statin, antihypertensives. Will monitor.

## 2019-04-28 ENCOUNTER — Encounter: Payer: Self-pay | Admitting: Family Medicine

## 2019-04-28 DIAGNOSIS — M8580 Other specified disorders of bone density and structure, unspecified site: Secondary | ICD-10-CM

## 2019-04-28 DIAGNOSIS — Z780 Asymptomatic menopausal state: Secondary | ICD-10-CM | POA: Insufficient documentation

## 2019-04-28 HISTORY — DX: Other specified disorders of bone density and structure, unspecified site: M85.80

## 2019-04-28 NOTE — Progress Notes (Signed)
Please call patient: I have reviewed his/her lab results. Bone density shows low bone mass, but not yet osteoporosis. rec calcium and vit D daily. Will recheck in 2 years. Thanks

## 2019-05-08 ENCOUNTER — Telehealth: Payer: Self-pay | Admitting: Cardiovascular Disease

## 2019-05-08 NOTE — Telephone Encounter (Signed)
[facility] mail order pharmacy wanting to know if Dr. Acie Fredrickson is aware that the pt has indicated an allergy to calcium channel blockers and has been prescribed amlodipine (Norvasc0 5 mg tablet, an allergy/potential cross-sensitivity. PH# [phone] order # [id number]. Please address

## 2019-05-08 NOTE — Telephone Encounter (Signed)
Spoke with Carla Foster, pharmacist at [facility] who verbalized understanding that patient has been taking amlodipine for a while without interaction. She thanked me for the call.

## 2019-05-09 DIAGNOSIS — Z1231 Encounter for screening mammogram for malignant neoplasm of breast: Secondary | ICD-10-CM | POA: Diagnosis not present

## 2019-05-09 LAB — HM MAMMOGRAPHY

## 2019-05-10 ENCOUNTER — Encounter: Payer: Self-pay | Admitting: Family Medicine

## 2019-05-23 DIAGNOSIS — N6311 Unspecified lump in the right breast, upper outer quadrant: Secondary | ICD-10-CM | POA: Diagnosis not present

## 2019-05-23 DIAGNOSIS — N6489 Other specified disorders of breast: Secondary | ICD-10-CM | POA: Diagnosis not present

## 2019-05-31 ENCOUNTER — Other Ambulatory Visit: Payer: Self-pay | Admitting: Radiology

## 2019-05-31 DIAGNOSIS — C50411 Malignant neoplasm of upper-outer quadrant of right female breast: Secondary | ICD-10-CM | POA: Diagnosis not present

## 2019-05-31 DIAGNOSIS — N6311 Unspecified lump in the right breast, upper outer quadrant: Secondary | ICD-10-CM | POA: Diagnosis not present

## 2019-06-01 ENCOUNTER — Telehealth: Payer: Self-pay | Admitting: Oncology

## 2019-06-01 NOTE — Telephone Encounter (Signed)
Spoke with patient to confirm afternoon [facility] appointment for 11/18, packet sent by [facility]

## 2019-06-02 ENCOUNTER — Encounter: Payer: Self-pay | Admitting: *Deleted

## 2019-06-05 ENCOUNTER — Other Ambulatory Visit: Payer: Self-pay | Admitting: *Deleted

## 2019-06-05 ENCOUNTER — Telehealth: Payer: Self-pay | Admitting: Oncology

## 2019-06-05 DIAGNOSIS — Z853 Personal history of malignant neoplasm of breast: Secondary | ICD-10-CM | POA: Insufficient documentation

## 2019-06-05 DIAGNOSIS — C50411 Malignant neoplasm of upper-outer quadrant of right female breast: Secondary | ICD-10-CM | POA: Insufficient documentation

## 2019-06-05 DIAGNOSIS — Z170 Estrogen receptor positive status [ER+]: Secondary | ICD-10-CM

## 2019-06-05 NOTE — Telephone Encounter (Signed)
error 

## 2019-06-06 NOTE — Progress Notes (Signed)
[place]  Telephone:(336) [phone] [phone]     ID: Carla Foster DOB: 1940/10/16  MR#: [id number]  KXF#:[id number]  Patient Care Team: Leamon Arnt, MD as PCP - General (Family Medicine) Nahser, Wonda Cheng, MD as PCP - Cardiology (Cardiology) Juanita Craver, MD as Consulting Physician (Gastroenterology) Mammography, [facility] (Diagnostic Radiology) Mauro Kaufmann, RN as Oncology Nurse Navigator Rockwell Germany, RN as Oncology Nurse Navigator Elias Bordner, Virgie Dad, MD as Consulting Physician (Oncology) Eppie [place], MD as Consulting Physician (Radiation Oncology) Rutherford Guys, MD as Consulting Physician (Ophthalmology) Vicie Mutters, MD as Consulting Physician (Otolaryngology) Chauncey Cruel, MD OTHER MD:  CHIEF COMPLAINT: Invasive breast cancer, borderline estrogen receptor positive  CURRENT TREATMENT: Awaiting definitive surgery   HISTORY OF CURRENT ILLNESS: Carla Foster had routine screening mammography on 05/09/2019 showing a possible abnormality in the right breast. She underwent right diagnostic mammography with tomography and right breast ultrasonography at [facility] on 05/23/2019 showing: breast density category C; 0.9 cm irregular mass in the right breast at 10 o'clock; no abnormal lymph nodes seen in the right axilla.  Accordingly on 05/31/2019 she proceeded to biopsy of the right breast area in question. The pathology from this procedure ([id number]) showed: invasive mammary carcinoma, grade 2, e-cadherin positive. Prognostic indicators significant for: estrogen receptor, 70% positive with weak staining intensity and progesterone receptor, 0% negative. Proliferation marker Ki67 at 15%. HER2 equivocal by immunohistochemistry (2+), with FISH pending  The patient's subsequent history is as detailed below.   INTERVAL HISTORY: Carla Foster was evaluated in the multidisciplinary breast cancer clinic on 06/07/2019 accompanied by her daughter Carla Foster. Her case was  also presented at the multidisciplinary breast cancer conference on the same day. At that time a preliminary plan was proposed: Breast conserving surgery likely without sentinel lymph node sampling, likely no chemotherapy, consider antiestrogens, consider adjuvant radiation.  Of note, she underwent bone density screening on 04/27/2019. This showed a T-score of -1.1, which is minimally osteopenic.   REVIEW OF SYSTEMS: Carla Foster reports night sweats, cramping, hearing loss (wears a hearing aid), sinus problems, feet swelling, poor circulation, shortness of breath with climbing stairs, productive cough, kidney disease, joint pain and arthritis, anxiety, diabetes, and anemia. There were no specific symptoms leading to the original mammogram, which was routinely scheduled. The patient denies unusual headaches, visual changes, nausea, vomiting, stiff neck, dizziness, or gait imbalance. There has been no cough, phlegm production, or pleurisy, no chest pain or pressure, and no change in bowel or bladder habits. The patient denies fever, rash, bleeding, unexplained fatigue or unexplained weight loss. A detailed review of systems was otherwise entirely negative.   PAST MEDICAL HISTORY: Past Medical History:  Diagnosis Date  . Acute blood loss anemia   . AKI (acute kidney injury) ([place])   . Asthma   . CKD (chronic kidney disease) stage 3, GFR 30-59 ml/min 01/06/2019  . Complication of anesthesia    nausea  . Diabetes mellitus    type 2  . Family history of upper GI bleeding 09/05/2018   Dr. Carlean Purl, hospitalized: egd 11/2017  . GERD (gastroesophageal reflux disease)   . GI bleed   . H/O: upper GI bleed 09/05/2018   11/2017, Peptic ulcer by EGD, Dr. Carlean Purl; hospitalized  . Hematemesis   . Hyperlipidemia   . Hypertension   . Melena   . Osteopenia 04/28/2019   dexa 04/2019 T = - 2.3 L/spine lowest. Recheck 2 years.   Marland Kitchen PAD (peripheral artery disease) ([place]) - asymptomatic 04/27/2019  [place] ABI's by in  home nurse assessment: Bilateral abnl: betweein 0.6 and 0.7 Can't tolerate aspirin    PAST SURGICAL HISTORY: Past Surgical History:  Procedure Laterality Date  . CERVICAL DISCECTOMY  08/23/00   C5-6, C6-7 anterior cervical diskectomy with fibular bone bank fusion followed by [place]antis anterior cervical plating with the operating microscope  --  SURGEON:  Faythe Ghee, M.D.  . ESOPHAGOGASTRODUODENOSCOPY (EGD) WITH PROPOFOL N/A 11/21/2017   Procedure: ESOPHAGOGASTRODUODENOSCOPY (EGD) WITH PROPOFOL;  Surgeon: Gatha Mayer, MD;  Location: [facility];  Service: Endoscopy;  Laterality: N/A;  . PARTIAL HYSTERECTOMY    . TEE WITHOUT CARDIOVERSION N/A 08/01/2018   Procedure: TRANSESOPHAGEAL ECHOCARDIOGRAM (TEE);  Surgeon: Skeet Latch, MD;  Location: [facility] ENDOSCOPY;  Service: Cardiovascular;  Laterality: N/A;    FAMILY HISTORY: Family History  Problem Relation Age of Onset  . Emphysema Father   . Stroke Mother   . Hypertension Mother   . Atrial fibrillation Mother        heart flutter  . Asthma Daughter   . Cancer Daughter    Patient's father was 40 years old when he died from [place]. Patient's mother died from stroke at age 68. The patient denies a family hx of breast, pancreas, prostate or ovarian cancer. She does note anal cancer in her daughter, diagnosed at daughters age 13.  The patient has 1 brother and 1 sister.   GYNECOLOGIC HISTORY:  No LMP recorded. Patient has had a hysterectomy. Menarche: 78 years old Age at first live birth: 78 years old [place] P3 Contraceptive: used for 10 years HRT: never used  Hysterectomy? Yes, partial BSO? no   SOCIAL HISTORY: (updated 05/2019)  Carla Foster retired from working for [employer]. She is widowed. Her husband, Carla Foster, passed away in 07-22-06; he was my patient.  Carla Foster lives alone, with no pets.  She is very functional, drives, and takes care of all her own needs.  Daughter Carla Foster, age 80, works in [place]  as Librarian, academic of [place]. Daughter Carla Foster, age 57, works in [employer] as a Engineering geologist. Daughter Carla Foster, age 50, lives in [place] and does not currently work. The patient has 7 grandchildren and 9 great-grandchildren. She attends [employer].    ADVANCED DIRECTIVES: Not in place, but intends to name her daughter Carla Foster as her HCPOA.  At the 06/07/2019 visit the patient was given all the appropriate documents to complete and notarized at her discretion   HEALTH MAINTENANCE: Social History   Tobacco Use  . Smoking status: Former Smoker    Packs/day: 1.00    Years: 18.00    Pack years: 18.00    Types: Cigarettes    Quit date: 01/13/1974    Years since quitting: 45.4  . Smokeless tobacco: Never Used  Substance Use Topics  . Alcohol use: No  . Drug use: No     Colonoscopy: 03/2018, normal, no repeat indicated  PAP: none on file, s/p hysterectomy  Bone density: 04/2019, -1.1   Allergies  Allergen Reactions  . Tiazac [Diltiazem Hcl] Other (See Comments)    Ankle edema    Current Outpatient Medications  Medication Sig Dispense Refill  . albuterol (PROVENTIL HFA;VENTOLIN HFA) 108 (90 Base) MCG/ACT inhaler Inhale 2 puffs into the lungs every 6 (six) hours as needed for wheezing or shortness of breath.     Marland Kitchen amLODipine (NORVASC) 5 MG tablet Take 1 tablet (5 mg total) by mouth daily. 90 tablet 3  .  calcium carbonate (TUMS - DOSED IN MG ELEMENTAL CALCIUM) 500 MG chewable tablet Chew 1-2 tablets by mouth daily as needed for indigestion or heartburn.     . Calcium Carbonate-Vitamin D (CALCIUM 600 + D PO) Take 1 tablet by mouth daily.      . carvedilol (COREG) 25 MG tablet Take 1 tablet (25 mg total) by mouth 2 (two) times daily. 180 tablet 3  . cholecalciferol (VITAMIN D) 1000 UNITS tablet Take 1,000 Units by mouth daily.      Marland Kitchen doxazosin (CARDURA) 4 MG tablet Take 1 tablet (4 mg total) by mouth at bedtime. 90 tablet 3  . fenofibrate 160  MG tablet Take 1 tablet (160 mg total) by mouth daily. 90 tablet 3  . ferrous sulfate 325 (65 FE) MG tablet Take 1 tablet (325 mg total) by mouth daily. 90 tablet 3  . furosemide (LASIX) 40 MG tablet Take 0.5 tablets (20 mg total) by mouth daily. 30 tablet 0  . losartan (COZAAR) 50 MG tablet Take 1 tablet (50 mg total) by mouth daily. 90 tablet 3  . metFORMIN (GLUCOPHAGE) 1000 MG tablet Take 1 tablet (1,000 mg total) by mouth daily. 90 tablet 3  . Misc Natural Products (OSTEO BI-FLEX JOINT SHIELD PO) Take by mouth.    . Propylene Glycol (SYSTANE BALANCE) 0.6 % SOLN Apply 1 drop to eye as needed (dry eyes).    . simvastatin (ZOCOR) 20 MG tablet Take 1 tablet (20 mg total) by mouth at bedtime. 90 tablet 3  . vitamin A 8000 UNIT capsule Take 8,000 Units by mouth daily.     . vitamin C (ASCORBIC ACID) 500 MG tablet Take 500 mg by mouth daily.       No current facility-administered medications for this visit.     OBJECTIVE: Older white woman in no acute distress  Vitals:   06/07/19 1302  BP: (!) 157/66  Pulse: 78  Resp: 18  Temp: 98.7 F (37.1 C)  SpO2: 94%     Body mass index is 25.5 kg/m.   Wt Readings from Last 3 Encounters:  06/07/19 158 lb (71.7 kg)  04/10/19 158 lb 3.2 oz (71.8 kg)  03/10/19 155 lb (70.3 kg)      ECOG FS:1 - Symptomatic but completely ambulatory  Ocular: Sclerae unicteric, pupils round and equal Ear-nose-throat: Wearing a mask Lymphatic: No cervical or supraclavicular adenopathy Lungs no rales or rhonchi Heart regular rate and rhythm Abd soft, nontender, positive bowel sounds MSK no focal spinal tenderness, no joint edema Neuro: non-focal, well-oriented, appropriate affect Breasts: The right breast is status post recent biopsy.  There is moderate ecchymosis.  I do not palpate a mass.  There is no skin or nipple change of concern.  The left breast is benign.  Both axillae are benign   LAB RESULTS:  CMP     Component Value Date/Time   NA 137  06/07/2019 1240   NA 142 12/29/2018 1052   K 4.7 06/07/2019 1240   CL 100 06/07/2019 1240   CO2 26 06/07/2019 1240   GLUCOSE 269 (H) 06/07/2019 1240   BUN 39 (H) 06/07/2019 1240   BUN 35 (H) 12/29/2018 1052   CREATININE 1.44 (H) 06/07/2019 1240   CREATININE 1.30 (H) 03/17/2016 0949   CALCIUM 10.1 06/07/2019 1240   PROT 7.1 06/07/2019 1240   ALBUMIN 3.8 06/07/2019 1240   AST 20 06/07/2019 1240   ALT 12 06/07/2019 1240   ALKPHOS 27 (L) 06/07/2019 1240   BILITOT 0.2 (  L) 06/07/2019 1240   GFRNONAA 34 (L) 06/07/2019 1240   GFRAA 40 (L) 06/07/2019 1240    No results found for: TOTALPROTELP, ALBUMINELP, A1GS, A2GS, BETS, BETA2SER, GAMS, MSPIKE, SPEI  No results found for: KPAFRELGTCHN, LAMBDASER, KAPLAMBRATIO  Lab Results  Component Value Date   WBC 8.0 06/07/2019   NEUTROABS 5.7 06/07/2019   HGB 10.0 (L) 06/07/2019   HCT 31.2 (L) 06/07/2019   MCV 91.5 06/07/2019   PLT 270 06/07/2019   No results found for: LABCA2  No components found for: [id number]  No results for input(s): INR in the last 168 hours.  No results found for: LABCA2  No results found for: VOJ500  No results found for: XFG182  No results found for: XHB716  No results found for: CA2729  No components found for: HGQUANT  No results found for: CEA1 / No results found for: CEA1   No results found for: AFPTUMOR  No results found for: C-Road  No results found for: PSA1  Appointment on 06/07/2019  Component Date Value Ref Range Status  . WBC Count 06/07/2019 8.0  4.0 - 10.5 K/uL Final  . RBC 06/07/2019 3.41* 3.87 - 5.11 MIL/uL Final  . Hemoglobin 06/07/2019 10.0* 12.0 - 15.0 g/dL Final  . HCT 06/07/2019 31.2* 36.0 - 46.0 % Final  . MCV 06/07/2019 91.5  80.0 - 100.0 fL Final  . MCH 06/07/2019 29.3  26.0 - 34.0 pg Final  . MCHC 06/07/2019 32.1  30.0 - 36.0 g/dL Final  . RDW 06/07/2019 12.2  11.5 - 15.5 % Final  . Platelet Count 06/07/2019 270  150 - 400 K/uL Final  . nRBC 06/07/2019 0.0  0.0 -  0.2 % Final  . Neutrophils Relative % 06/07/2019 71  % Final  . Neutro Abs 06/07/2019 5.7  1.7 - 7.7 K/uL Final  . Lymphocytes Relative 06/07/2019 18  % Final  . Lymphs Abs 06/07/2019 1.4  0.7 - 4.0 K/uL Final  . Monocytes Relative 06/07/2019 9  % Final  . Monocytes Absolute 06/07/2019 0.7  0.1 - 1.0 K/uL Final  . Eosinophils Relative 06/07/2019 1  % Final  . Eosinophils Absolute 06/07/2019 0.1  0.0 - 0.5 K/uL Final  . Basophils Relative 06/07/2019 1  % Final  . Basophils Absolute 06/07/2019 0.1  0.0 - 0.1 K/uL Final  . Immature Granulocytes 06/07/2019 0  % Final  . Abs Immature Granulocytes 06/07/2019 0.02  0.00 - 0.07 K/uL Final   Performed at [facility], [place] [address], [place], [place] [address]  . Sodium 06/07/2019 137  135 - 145 mmol/L Final  . Potassium 06/07/2019 4.7  3.5 - 5.1 mmol/L Final  . Chloride 06/07/2019 100  98 - 111 mmol/L Final  . CO2 06/07/2019 26  22 - 32 mmol/L Final  . Glucose, Bld 06/07/2019 269* 70 - 99 mg/dL Final  . BUN 06/07/2019 39* 8 - 23 mg/dL Final  . Creatinine 06/07/2019 1.44* 0.44 - 1.00 mg/dL Final  . Calcium 06/07/2019 10.1  8.9 - 10.3 mg/dL Final  . Total Protein 06/07/2019 7.1  6.5 - 8.1 g/dL Final  . Albumin 06/07/2019 3.8  3.5 - 5.0 g/dL Final  . AST 06/07/2019 20  15 - 41 U/L Final  . ALT 06/07/2019 12  0 - 44 U/L Final  . Alkaline Phosphatase 06/07/2019 27* 38 - 126 U/L Final  . Total Bilirubin 06/07/2019 0.2* 0.3 - 1.2 mg/dL Final  . GFR, Est Non Af Am 06/07/2019 34* >60 mL/min  Final  . GFR, Est AFR Am 06/07/2019 40* >60 mL/min Final  . Anion gap 06/07/2019 11  5 - 15 Final   Performed at [facility], [place] [place], [place] [address]    (this displays the last labs from the last 3 days)  No results found for: TOTALPROTELP, ALBUMINELP, A1GS, A2GS, BETS, BETA2SER, GAMS, MSPIKE, SPEI (this displays SPEP labs)  No results found for: KPAFRELGTCHN, LAMBDASER,  KAPLAMBRATIO (kappa/lambda light chains)  No results found for: HGBA, HGBA2QUANT, HGBFQUANT, HGBSQUAN (Hemoglobinopathy evaluation)   No results found for: LDH  Lab Results  Component Value Date   IRON 66 01/06/2019   TIBC 381 01/06/2019   IRONPCTSAT 17 01/06/2019   (Iron and TIBC)  Lab Results  Component Value Date   FERRITIN 122 01/06/2019    Urinalysis    Component Value Date/Time   COLORURINE YELLOW 07/26/2018 0633   APPEARANCEUR HAZY (A) 07/26/2018 0633   LABSPEC 1.017 07/26/2018 [place] 5.0 07/26/2018 0633   GLUCOSEU 150 (A) 07/26/2018 0633   HGBUR NEGATIVE 07/26/2018 0633   BILIRUBINUR NEGATIVE 07/26/2018 0633   KETONESUR 5 (A) 07/26/2018 0633   PROTEINUR 100 (A) 07/26/2018 0633   NITRITE NEGATIVE 07/26/2018 0633   LEUKOCYTESUR NEGATIVE 07/26/2018 7048     STUDIES: Hm Mammography  Result Date: 05/09/2019 There is no mammographic evidence of malignancy. Routine mammographic evaluation in 1 year is recommended. Letter mailed to pt with normal results.    ELIGIBLE FOR AVAILABLE RESEARCH PROTOCOL: no  ASSESSMENT: 78 y.o. [place], [place] woman status post right breast upper outer quadrant biopsy 05/31/2019 for a clinical T1b N0, stage IA invasive ductal carcinoma, grade 2, weakly estrogen receptor positive, progesterone receptor and HER-2 negative, with an MIB-1 of 15%  (1) definitive surgery pending  (2) adjuvant radiation to follow  (3) consider anastrozole at the completion of local treatment  (a) bone density 04/27/2019 shows a T score of -1.1  PLAN: I spent approximately 60 minutes face to face with Carla Foster with more than 50% of that time spent in counseling and coordination of care. Specifically we reviewed the biology of the patient's diagnosis and the specifics of her situation.  We first reviewed the fact that cancer is not one disease but more than 100 different diseases and that it is important to keep them separate-- otherwise when friends  and relatives discuss their own cancer experiences with [place] confusion can result. Similarly we explained that if breast cancer spreads to the bone or liver, the patient would not have bone cancer or liver cancer, but breast cancer in the bone and breast cancer in the liver: one cancer in three places-- not 3 different cancers which otherwise would have to be treated in 3 different ways.  We discussed the difference between local and systemic therapy. In terms of loco-regional treatment, lumpectomy plus radiation is equivalent to mastectomy as far as survival is concerned. For this reason, and because the cosmetic results are generally superior, we recommend breast conserving surgery.   Next we went over the options for systemic therapy which are anti-estrogens, anti-HER-2 immunotherapy, and chemotherapy.  We do not have the final results of HER-2 in this case.  She is a possible candidate for anti-estrogens, depending on the repeat prognostic panel from her definitive surgery.  In many cases patients who show weak estrogen positivity on biopsy proved to be triple negative on final surgery..  The question of chemotherapy is more complicated. Chemotherapy is most effective in  rapidly growing, aggressive tumors. It is much less effective in slow growing cancers, like Carla Foster 's. For that reason and given the patient's age and significant comorbidities we are going to forego Oncotype testing and chemotherapy.  The overall plan then is for surgery, radiation, and consideration of anastrozole.  In that regard note that the patient's most recent bone density is nearly normal.  Yizel has a good understanding of the overall plan. She agrees with it. She knows the goal of treatment in her case is cure. She will call with any problems that may develop before her next visit here.   Chauncey Cruel, MD   06/07/2019 3:04 PM Medical Oncology and Hematology [facility] [place], [place] [address] Tel. [phone]    Fax. [phone]   This document serves as a record of services personally performed by Lurline Del, MD. It was created on his behalf by Wilburn Mylar, a trained medical scribe. The creation of this record is based on the scribe's personal observations and the provider's statements to them.   I, Lurline Del MD, have reviewed the above documentation for accuracy and completeness, and I agree with the above.

## 2019-06-07 ENCOUNTER — Inpatient Hospital Stay: Payer: Medicare Other

## 2019-06-07 ENCOUNTER — Ambulatory Visit
Admission: RE | Admit: 2019-06-07 | Discharge: 2019-06-07 | Disposition: A | Discharge status: Home / Self Care | Payer: Medicare Other | Source: Ambulatory Visit | Attending: Radiation Oncology | Admitting: Radiation Oncology

## 2019-06-07 ENCOUNTER — Other Ambulatory Visit: Payer: Self-pay

## 2019-06-07 ENCOUNTER — Encounter: Payer: Self-pay | Admitting: General Practice

## 2019-06-07 ENCOUNTER — Encounter: Payer: Self-pay | Admitting: Oncology

## 2019-06-07 ENCOUNTER — Telehealth: Payer: Self-pay | Admitting: *Deleted

## 2019-06-07 ENCOUNTER — Inpatient Hospital Stay: Payer: Medicare Other | Attending: Oncology | Admitting: Oncology

## 2019-06-07 ENCOUNTER — Ambulatory Visit: Payer: Medicare Other | Admitting: Radiation Oncology

## 2019-06-07 ENCOUNTER — Other Ambulatory Visit: Payer: Self-pay | Admitting: *Deleted

## 2019-06-07 ENCOUNTER — Ambulatory Visit: Payer: Medicare Other | Admitting: Physical Therapy

## 2019-06-07 VITALS — BP 157/66 | HR 78 | Temp 98.7°F | Resp 18 | Ht 66.0 in | Wt 158.0 lb

## 2019-06-07 DIAGNOSIS — Z87891 Personal history of nicotine dependence: Secondary | ICD-10-CM

## 2019-06-07 DIAGNOSIS — I129 Hypertensive chronic kidney disease with stage 1 through stage 4 chronic kidney disease, or unspecified chronic kidney disease: Secondary | ICD-10-CM | POA: Insufficient documentation

## 2019-06-07 DIAGNOSIS — J45909 Unspecified asthma, uncomplicated: Secondary | ICD-10-CM | POA: Diagnosis not present

## 2019-06-07 DIAGNOSIS — Z79899 Other long term (current) drug therapy: Secondary | ICD-10-CM | POA: Diagnosis not present

## 2019-06-07 DIAGNOSIS — I739 Peripheral vascular disease, unspecified: Secondary | ICD-10-CM | POA: Diagnosis not present

## 2019-06-07 DIAGNOSIS — Z7984 Long term (current) use of oral hypoglycemic drugs: Secondary | ICD-10-CM | POA: Insufficient documentation

## 2019-06-07 DIAGNOSIS — C50411 Malignant neoplasm of upper-outer quadrant of right female breast: Secondary | ICD-10-CM | POA: Insufficient documentation

## 2019-06-07 DIAGNOSIS — N183 Chronic kidney disease, stage 3 unspecified: Secondary | ICD-10-CM | POA: Diagnosis not present

## 2019-06-07 DIAGNOSIS — E785 Hyperlipidemia, unspecified: Secondary | ICD-10-CM | POA: Diagnosis not present

## 2019-06-07 DIAGNOSIS — Z170 Estrogen receptor positive status [ER+]: Secondary | ICD-10-CM | POA: Insufficient documentation

## 2019-06-07 DIAGNOSIS — E1151 Type 2 diabetes mellitus with diabetic peripheral angiopathy without gangrene: Secondary | ICD-10-CM | POA: Insufficient documentation

## 2019-06-07 DIAGNOSIS — Z8249 Family history of ischemic heart disease and other diseases of the circulatory system: Secondary | ICD-10-CM | POA: Diagnosis not present

## 2019-06-07 DIAGNOSIS — D638 Anemia in other chronic diseases classified elsewhere: Secondary | ICD-10-CM

## 2019-06-07 DIAGNOSIS — K219 Gastro-esophageal reflux disease without esophagitis: Secondary | ICD-10-CM | POA: Diagnosis not present

## 2019-06-07 DIAGNOSIS — E1122 Type 2 diabetes mellitus with diabetic chronic kidney disease: Secondary | ICD-10-CM | POA: Diagnosis not present

## 2019-06-07 DIAGNOSIS — R531 Weakness: Secondary | ICD-10-CM

## 2019-06-07 DIAGNOSIS — M8580 Other specified disorders of bone density and structure, unspecified site: Secondary | ICD-10-CM

## 2019-06-07 DIAGNOSIS — I5033 Acute on chronic diastolic (congestive) heart failure: Secondary | ICD-10-CM

## 2019-06-07 LAB — CMP (CANCER CENTER ONLY)
ALT: 12 U/L (ref 0–44)
AST: 20 U/L (ref 15–41)
Albumin: 3.8 g/dL (ref 3.5–5.0)
Alkaline Phosphatase: 27 U/L — ABNORMAL LOW (ref 38–126)
Anion gap: 11 (ref 5–15)
BUN: 39 mg/dL — ABNORMAL HIGH (ref 8–23)
CO2: 26 mmol/L (ref 22–32)
Calcium: 10.1 mg/dL (ref 8.9–10.3)
Chloride: 100 mmol/L (ref 98–111)
Creatinine: 1.44 mg/dL — ABNORMAL HIGH (ref 0.44–1.00)
GFR, Est AFR Am: 40 mL/min — ABNORMAL LOW (ref >60)
GFR, Estimated: 34 mL/min — ABNORMAL LOW (ref >60)
Glucose, Bld: 269 mg/dL — ABNORMAL HIGH (ref 70–99)
Potassium: 4.7 mmol/L (ref 3.5–5.1)
Sodium: 137 mmol/L (ref 135–145)
Total Bilirubin: 0.2 mg/dL — ABNORMAL LOW (ref 0.3–1.2)
Total Protein: 7.1 g/dL (ref 6.5–8.1)

## 2019-06-07 LAB — CBC WITH DIFFERENTIAL (CANCER CENTER ONLY)
Abs Immature Granulocytes: 0.02 10*3/uL (ref 0.00–0.07)
Basophils Absolute: 0.1 10*3/uL (ref 0.0–0.1)
Basophils Relative: 1 %
Eosinophils Absolute: 0.1 10*3/uL (ref 0.0–0.5)
Eosinophils Relative: 1 %
HCT: 31.2 % — ABNORMAL LOW (ref 36.0–46.0)
Hemoglobin: 10 g/dL — ABNORMAL LOW (ref 12.0–15.0)
Immature Granulocytes: 0 %
Lymphocytes Relative: 18 %
Lymphs Abs: 1.4 10*3/uL (ref 0.7–4.0)
MCH: 29.3 pg (ref 26.0–34.0)
MCHC: 32.1 g/dL (ref 30.0–36.0)
MCV: 91.5 fL (ref 80.0–100.0)
Monocytes Absolute: 0.7 10*3/uL (ref 0.1–1.0)
Monocytes Relative: 9 %
Neutro Abs: 5.7 10*3/uL (ref 1.7–7.7)
Neutrophils Relative %: 71 %
Platelet Count: 270 10*3/uL (ref 150–400)
RBC: 3.41 MIL/uL — ABNORMAL LOW (ref 3.87–5.11)
RDW: 12.2 % (ref 11.5–15.5)
WBC Count: 8 10*3/uL (ref 4.0–10.5)
nRBC: 0 % (ref 0.0–0.2)

## 2019-06-07 NOTE — Telephone Encounter (Signed)
LMTCB 06/07/19

## 2019-06-07 NOTE — Progress Notes (Signed)
Radiation Oncology         [phone] ________________________________  Initial outpatient Consultation  Name: Carla Foster MRN: [id number]  Date: 06/07/2019  DOB: [birth year]  CC:Leamon Arnt, MD  Jovita Kussmaul, MD   REFERRING PHYSICIAN: Autumn Messing III, MD  DIAGNOSIS:    ICD-10-CM   1. Malignant neoplasm of upper-outer quadrant of right breast in female, estrogen receptor positive ([place])  C50.411    Z17.0      Cancer Staging Malignant neoplasm of upper-outer quadrant of right breast in female, estrogen receptor positive ([place]) Staging form: Breast, AJCC 8th Edition - Clinical stage from 06/07/2019: Stage IA (cT1b, cN0, cM0, G2, ER+, PR-, HER2-) - Unsigned    CHIEF COMPLAINT: Here to discuss management of right breast cancer  HISTORY OF PRESENT ILLNESS::Carla Foster is a 78 y.o. female who presented with breast abnormality on the following imaging: routine screening mammogram on the date of 05/09/2019.  Symptoms, if any, at that time, were: none.   Ultrasound of breast on 05/23/2019 revealed  0.9 cm irregular mass in the right breast at 10 o'clock; no abnormal lymph nodes seen in the right axilla.   Biopsy on date of 05/31/2019 showed invasive mammary carcinoma, e-cadherin positive.  ER status: 70% weakly positive; PR status negative, Her2 status equivocal; Grade 2. FISH results are pending.  She is in her usual state of health.   PREVIOUS RADIATION THERAPY: No  PAST MEDICAL HISTORY:  has a past medical history of Acute blood loss anemia, AKI (acute kidney injury) ([place]), Asthma, CKD (chronic kidney disease) stage 3, GFR 30-59 ml/min (10/26/8117), Complication of anesthesia, Diabetes mellitus, Family history of upper GI bleeding (09/05/2018), GERD (gastroesophageal reflux disease), GI bleed, H/O: upper GI bleed (09/05/2018), Hematemesis, Hyperlipidemia, Hypertension, Melena, Osteopenia (04/28/2019), and PAD (peripheral artery disease) ([place]) - asymptomatic (04/27/2019).    PAST  SURGICAL HISTORY: Past Surgical History:  Procedure Laterality Date  . CERVICAL DISCECTOMY  08/23/00   C5-6, C6-7 anterior cervical diskectomy with fibular bone bank fusion followed by Atlantis anterior cervical plating with the operating microscope  --  SURGEON:  Faythe Ghee, M.D.  . ESOPHAGOGASTRODUODENOSCOPY (EGD) WITH PROPOFOL N/A 11/21/2017   Procedure: ESOPHAGOGASTRODUODENOSCOPY (EGD) WITH PROPOFOL;  Surgeon: Gatha Mayer, MD;  Location: [place];  Service: Endoscopy;  Laterality: N/A;  . PARTIAL HYSTERECTOMY    . TEE WITHOUT CARDIOVERSION N/A 08/01/2018   Procedure: TRANSESOPHAGEAL ECHOCARDIOGRAM (TEE);  Surgeon: Skeet Latch, MD;  Location[facility] END[facility]COPY;  Service: Cardiovascular;  Laterality: N/A;    FAMILY HISTORY: family history includes Asthma in her daughter; Atrial fibrillation in her mother; Cancer in her daughter; Emphysema in her father; Hypertension in her mother; Stroke in her mother.  SOCIAL HISTORY:  reports that she quit smoking about 45 years ago. Her smoking use included cigarettes. She has a 18.00 pack-year smoking history. She has never used smokeless tobacco. She reports that she does not drink alcohol or use drugs.  ALLERGIES: Tiazac [diltiazem hcl]  MEDICATIONS:  Current Outpatient Medications  Medication Sig Dispense Refill  . albuterol (PROVENTIL HFA;VENTOLIN HFA) 108 (90 Base) MCG/ACT inhaler Inhale 2 puffs into the lungs every 6 (six) hours as needed for wheezing or shortness of breath.     Marland Kitchen amLODipine (NORVASC) 5 MG tablet Take 1 tablet (5 mg total) by mouth daily. 90 tablet 3  . calcium carbonate (TUMS - DOSED IN MG ELEMENTAL CALCIUM) 500 MG chewable tablet Chew 1-2 tablets by mouth daily as needed for indigestion or heartburn.     Marland Kitchen  Calcium Carbonate-Vitamin D (CALCIUM 600 + D PO) Take 1 tablet by mouth daily.      . carvedilol (COREG) 25 MG tablet Take 1 tablet (25 mg total) by mouth 2 (two) times daily. 180 tablet 3  . cholecalciferol  (VITAMIN D) 1000 UNITS tablet Take 1,000 Units by mouth daily.      Marland Kitchen doxazosin (CARDURA) 4 MG tablet Take 1 tablet (4 mg total) by mouth at bedtime. 90 tablet 3  . fenofibrate 160 MG tablet Take 1 tablet (160 mg total) by mouth daily. 90 tablet 3  . ferrous sulfate 325 (65 FE) MG tablet Take 1 tablet (325 mg total) by mouth daily. 90 tablet 3  . furosemide (LASIX) 40 MG tablet Take 0.5 tablets (20 mg total) by mouth daily. 30 tablet 0  . losartan (COZAAR) 50 MG tablet Take 1 tablet (50 mg total) by mouth daily. 90 tablet 3  . metFORMIN (GLUCOPHAGE) 1000 MG tablet Take 1 tablet (1,000 mg total) by mouth daily. 90 tablet 3  . Misc Natural Products (OSTEO BI-FLEX JOINT SHIELD PO) Take by mouth.    . Propylene Glycol (SYSTANE BALANCE) 0.6 % SOLN Apply 1 drop to eye as needed (dry eyes).    . simvastatin (ZOCOR) 20 MG tablet Take 1 tablet (20 mg total) by mouth at bedtime. 90 tablet 3  . vitamin A 8000 UNIT capsule Take 8,000 Units by mouth daily.     . vitamin C (ASCORBIC ACID) 500 MG tablet Take 500 mg by mouth daily.       No current facility-administered medications for this encounter.     REVIEW OF SYSTEMS: As above    PHYSICAL EXAM:    Wt Readings from Last 3 Encounters:  06/07/19 158 lb (71.7 kg)  04/10/19 158 lb 3.2 oz (71.8 kg)  03/10/19 155 lb (70.3 kg)    General: Alert and oriented, in no acute distress  Psychiatric: Judgment and insight are intact. Affect is appropriate. Breasts: Lower outer quadrant of right breast is notable for a 1 cm mass.. No other palpable masses appreciated in the breasts or axillae bilaterally.    ECOG = 1  0 - Asymptomatic (Fully active, able to carry on all predisease activities without restriction)  1 - Symptomatic but completely ambulatory (Restricted in physically strenuous activity but ambulatory and able to carry out work of a light or sedentary nature. For example, light housework, office work)  2 - Symptomatic, <50% in bed during the  day (Ambulatory and capable of all self care but unable to carry out any work activities. Up and about more than 50% of waking hours)  3 - Symptomatic, >50% in bed, but not bedbound (Capable of only limited self-care, confined to bed or chair 50% or more of waking hours)  4 - Bedbound (Completely disabled. Cannot carry on any self-care. Totally confined to bed or chair)  5 - Death   Eustace Pen MM, Creech RH, Tormey DC, et al. [phone]). "Toxicity and response criteria of the [facility]". [place] Oncol. 5 (6): 649-55   LABORATORY DATA:  Lab Results  Component Value Date   WBC 8.0 06/07/2019   HGB 10.0 (L) 06/07/2019   HCT 31.2 (L) 06/07/2019   MCV 91.5 06/07/2019   PLT 270 06/07/2019   CMP     Component Value Date/Time   NA 137 06/07/2019 1240   NA 142 12/29/2018 1052   K 4.7 06/07/2019 1240   CL 100 06/07/2019 1240  CO2 26 06/07/2019 1240   GLUCOSE 269 (H) 06/07/2019 1240   BUN 39 (H) 06/07/2019 1240   BUN 35 (H) 12/29/2018 1052   CREATININE 1.44 (H) 06/07/2019 1240   CREATININE 1.30 (H) 03/17/2016 0949   CALCIUM 10.1 06/07/2019 1240   PROT 7.1 06/07/2019 1240   ALBUMIN 3.8 06/07/2019 1240   AST 20 06/07/2019 1240   ALT 12 06/07/2019 1240   ALKPHOS 27 (L) 06/07/2019 1240   BILITOT 0.2 (L) 06/07/2019 1240   GFRNONAA 34 (L) 06/07/2019 1240   GFRAA 40 (L) 06/07/2019 1240         RADIOGRAPHY: Hm Mammography  Result Date: 05/09/2019 There is no mammographic evidence of malignancy. Routine mammographic evaluation in 1 year is recommended. Letter mailed to pt with normal results.      IMPRESSION/PLAN: Right Breast Cancer   It was a pleasure meeting the patient today. We discussed the risks, benefits, and side effects of radiotherapy.  Given the week estrogen positivity I think radiotherapy plays an important role in boosting her prognosis.  I recommend radiotherapy to the right breast to reduce her risk of locoregional recurrence by 2/3.  We  discussed that radiation would take approximately 4 weeks to complete and that I would give the patient a few weeks to heal following surgery before starting treatment planning. We spoke about acute effects including skin irritation and fatigue as well as much less common late effects including internal organ injury or irritation. We spoke about the latest technology that is used to minimize the risk of late effects for patients undergoing radiotherapy to the breast or chest wall. No guarantees of treatment were given. The patient is enthusiastic about proceeding with treatment. I look forward to participating in the patient's care.  I will await her referral back to me for postoperative follow-up and eventual CT simulation/treatment planning.  I spent 10 minutes  face to face with the patient and more than 50% of that time was spent in counseling and/or coordination of care.   __________________________________________   Eppie Gibson, MD   This document serves as a record of services personally performed by Eppie Gibson, MD. It was created on her behalf by Wilburn Mylar, a trained medical scribe. The creation of this record is based on the scribe's personal observations and the provider's statements to them. This document has been checked and approved by the attending provider.

## 2019-06-07 NOTE — Progress Notes (Signed)
[place] Psychosocial Distress Screening Spiritual Care  Met with Carla Foster and her daughter, who is a six-year cancer survivor, in [facility] to introduce [place] team/resources, reviewing distress screen per protocol.  The patient scored a 1 on the Psychosocial Distress Thermometer which indicates mild distress. Also assessed for distress and other psychosocial needs.   ONCBCN DISTRESS SCREENING 06/07/2019  Screening Type Initial Screening  Distress experienced in past week (1-10) 1  Information Concerns Type Lack of info about diagnosis  Referral to support programs Yes   Carla Foster and her daughter reported reduced distress after meeting team and learning treatment plan at [facility]. Provided empathic listening, emotional support, normalization of feelings, and encouragement to explore [place]. Emphasized that programming is for patients, caregivers, and survivors.  Follow up needed: No. Per Carla Foster, no other needs or concerns at this time, but she plans to phone team as needed/desired. Please also page if needs arise or circumstances change. Thank you!   [place], [place], [facility] Pager [phone] Voicemail [phone]

## 2019-06-07 NOTE — Telephone Encounter (Signed)
   [facility] Pre-operative Risk Assessment    Request for surgical clearance:  1. What type of surgery is being performed? BREAST LUMPECTOMY   2. When is this surgery scheduled? TBD   3. What type of clearance is required (medical clearance vs. Pharmacy clearance to hold med vs. Both)? MEDICAL  4. Are there any medications that need to be held prior to surgery and how long? NONE LISTED    5. Practice name and name of physician performing surgery?  [facility]; DR. PAUL TOTH   6. What is your office phone number [phone]     7.   What is your office fax number [phone]  8.   Anesthesia type (None, local, MAC, general) ? GENERAL    Julaine Hua 06/07/2019, 3:22 PM  _________________________________________________________________   (provider comments below)

## 2019-06-08 LAB — GENETIC SCREENING ORDER

## 2019-06-08 NOTE — Telephone Encounter (Signed)
   Primary Cardiologist: Mertie Moores, MD  Chart reviewed as part of pre-operative protocol coverage. Patient was contacted 06/08/2019 in reference to pre-operative risk assessment for pending surgery as outlined below.  Carla Foster was last seen on 03/10/2019 by Richardson Dopp PA-C.  Since that day, Carla Foster has done well without chest pain or shortness of breath.  Therefore, based on ACC/AHA guidelines, the patient would be at acceptable risk for the planned procedure without further cardiovascular testing.   I will route this recommendation to the requesting party via Epic fax function and remove from pre-op pool.  Please call with questions.  [place], [place] 06/08/2019, 2:00 PM

## 2019-06-08 NOTE — Telephone Encounter (Signed)
Follow up ° ° ° °Patient is returning call in reference to pre-op clearance.  °

## 2019-06-09 ENCOUNTER — Telehealth: Payer: Self-pay | Admitting: *Deleted

## 2019-06-09 NOTE — Telephone Encounter (Signed)
Spoke to pt concerning [place] from 06/07/19. Denies questions or concerns regarding dx or treatment care plan. Encourage pt to call with needs. Received verbal understanding.

## 2019-06-12 ENCOUNTER — Other Ambulatory Visit: Payer: Self-pay | Admitting: Family Medicine

## 2019-06-12 MED ORDER — ALBUTEROL SULFATE HFA 108 (90 BASE) MCG/ACT IN AERS: 2.0000 | INHALATION_SPRAY | Freq: Four times a day (QID) | RESPIRATORY_TRACT | 1 refills | Status: DC | PRN

## 2019-06-12 NOTE — Progress Notes (Signed)
Refilled request for albuterol hfa refilled. 90 day.  Orma Flaming, MD [place]

## 2019-06-13 ENCOUNTER — Other Ambulatory Visit: Payer: Self-pay

## 2019-06-13 MED ORDER — ALBUTEROL SULFATE HFA 108 (90 BASE) MCG/ACT IN AERS: 2.0000 | INHALATION_SPRAY | Freq: Four times a day (QID) | RESPIRATORY_TRACT | 3 refills | Status: DC | PRN

## 2019-06-13 NOTE — Progress Notes (Signed)
Erroneous encounter-disregard

## 2019-06-20 ENCOUNTER — Ambulatory Visit: Payer: Self-pay | Admitting: General Surgery

## 2019-06-20 DIAGNOSIS — C50411 Malignant neoplasm of upper-outer quadrant of right female breast: Secondary | ICD-10-CM

## 2019-06-21 ENCOUNTER — Telehealth: Payer: Self-pay

## 2019-06-21 NOTE — Telephone Encounter (Signed)
Nutrition Assessment  Reason for Assessment:  Pt attended Breast Clinic on 06/07/2019 and was given nutrition packet by nurse navigator  ASSESSMENT:  78 year old female with right breast cancer.  Planning surgery, radiation and antiestrogens.  Past medical history of HTN, DM, CKD, HLD, GERD  Spoke with patient via phone to introduce self and service at [facility].  Patient reports that her appetite has decreased since she has gotten older.  Reports that she is drinking ensure shakes.   Medications:  reviewed  Labs: reviewed  Anthropometrics:   Height: 66 inches Weight: 158 lb  BMI: 25   NUTRITION DIAGNOSIS: Food and nutrition related knowledge deficit related to new diagnosis of breast cancer as evidenced by no prior need for nutrition related information.  INTERVENTION:   Discussed briefly packet of information regarding nutritional tips for breast cancer patients. No questions at this time. Contact information provided and patient knows to contact me with questions/concerns.    MONITORING, EVALUATION, and GOAL: Pt will consume a healthy plant based diet to maintain lean body mass throughout treatment.   Carla Foster B. Zenia Resides, [place], [place] Registered Dietitian [phone] (pager)

## 2019-06-22 ENCOUNTER — Other Ambulatory Visit: Payer: Self-pay

## 2019-06-22 ENCOUNTER — Encounter (HOSPITAL_BASED_OUTPATIENT_CLINIC_OR_DEPARTMENT_OTHER): Payer: Self-pay

## 2019-06-22 ENCOUNTER — Other Ambulatory Visit: Payer: Self-pay | Admitting: General Surgery

## 2019-06-22 ENCOUNTER — Other Ambulatory Visit: Payer: Self-pay | Admitting: *Deleted

## 2019-06-22 DIAGNOSIS — Z170 Estrogen receptor positive status [ER+]: Secondary | ICD-10-CM

## 2019-06-22 DIAGNOSIS — C50411 Malignant neoplasm of upper-outer quadrant of right female breast: Secondary | ICD-10-CM

## 2019-06-23 NOTE — Progress Notes (Signed)

## 2019-06-24 ENCOUNTER — Other Ambulatory Visit (HOSPITAL_COMMUNITY): Payer: Medicare Other

## 2019-07-06 ENCOUNTER — Other Ambulatory Visit (HOSPITAL_COMMUNITY)
Admission: RE | Admit: 2019-07-06 | Discharge: 2019-07-06 | Disposition: A | Discharge status: Home / Self Care | Payer: Medicare Other | Source: Ambulatory Visit | Attending: General Surgery | Admitting: General Surgery

## 2019-07-06 DIAGNOSIS — Z01812 Encounter for preprocedural laboratory examination: Secondary | ICD-10-CM | POA: Insufficient documentation

## 2019-07-06 DIAGNOSIS — Z20828 Contact with and (suspected) exposure to other viral communicable diseases: Secondary | ICD-10-CM | POA: Diagnosis not present

## 2019-07-06 LAB — SARS CORONAVIRUS 2 (TAT 6-24 HRS): SARS Coronavirus 2: NEGATIVE

## 2019-07-07 ENCOUNTER — Ambulatory Visit: Payer: Medicare Other | Admitting: Family Medicine

## 2019-07-07 ENCOUNTER — Other Ambulatory Visit: Payer: Self-pay

## 2019-07-07 ENCOUNTER — Encounter (HOSPITAL_BASED_OUTPATIENT_CLINIC_OR_DEPARTMENT_OTHER): Payer: Self-pay | Admitting: General Surgery

## 2019-07-10 ENCOUNTER — Ambulatory Visit
Admission: RE | Admit: 2019-07-10 | Discharge: 2019-07-10 | Disposition: A | Discharge status: Home / Self Care | Payer: Medicare Other | Source: Ambulatory Visit | Attending: General Surgery | Admitting: General Surgery

## 2019-07-10 ENCOUNTER — Ambulatory Visit (HOSPITAL_BASED_OUTPATIENT_CLINIC_OR_DEPARTMENT_OTHER): Payer: Medicare Other | Admitting: Certified Registered Nurse Anesthetist

## 2019-07-10 ENCOUNTER — Ambulatory Visit (HOSPITAL_BASED_OUTPATIENT_CLINIC_OR_DEPARTMENT_OTHER)
Admission: RE | Admit: 2019-07-10 | Discharge: 2019-07-10 | Disposition: A | Discharge status: Home / Self Care | Payer: Medicare Other | Attending: General Surgery | Admitting: General Surgery

## 2019-07-10 ENCOUNTER — Encounter (HOSPITAL_BASED_OUTPATIENT_CLINIC_OR_DEPARTMENT_OTHER): Payer: Self-pay | Admitting: General Surgery

## 2019-07-10 ENCOUNTER — Other Ambulatory Visit: Payer: Self-pay

## 2019-07-10 ENCOUNTER — Encounter (HOSPITAL_BASED_OUTPATIENT_CLINIC_OR_DEPARTMENT_OTHER)
Admission: RE | Disposition: A | Discharge status: Home / Self Care | Payer: Self-pay | Source: Home / Self Care | Attending: General Surgery

## 2019-07-10 DIAGNOSIS — K279 Peptic ulcer, site unspecified, unspecified as acute or chronic, without hemorrhage or perforation: Secondary | ICD-10-CM | POA: Diagnosis not present

## 2019-07-10 DIAGNOSIS — Z823 Family history of stroke: Secondary | ICD-10-CM | POA: Insufficient documentation

## 2019-07-10 DIAGNOSIS — C50411 Malignant neoplasm of upper-outer quadrant of right female breast: Secondary | ICD-10-CM | POA: Diagnosis present

## 2019-07-10 DIAGNOSIS — D649 Anemia, unspecified: Secondary | ICD-10-CM | POA: Insufficient documentation

## 2019-07-10 DIAGNOSIS — I5033 Acute on chronic diastolic (congestive) heart failure: Secondary | ICD-10-CM | POA: Diagnosis not present

## 2019-07-10 DIAGNOSIS — J45909 Unspecified asthma, uncomplicated: Secondary | ICD-10-CM | POA: Insufficient documentation

## 2019-07-10 DIAGNOSIS — F419 Anxiety disorder, unspecified: Secondary | ICD-10-CM | POA: Insufficient documentation

## 2019-07-10 DIAGNOSIS — Z8261 Family history of arthritis: Secondary | ICD-10-CM | POA: Diagnosis not present

## 2019-07-10 DIAGNOSIS — E7800 Pure hypercholesterolemia, unspecified: Secondary | ICD-10-CM | POA: Insufficient documentation

## 2019-07-10 DIAGNOSIS — D0511 Intraductal carcinoma in situ of right breast: Secondary | ICD-10-CM | POA: Diagnosis not present

## 2019-07-10 DIAGNOSIS — C50911 Malignant neoplasm of unspecified site of right female breast: Secondary | ICD-10-CM | POA: Diagnosis not present

## 2019-07-10 DIAGNOSIS — Z87891 Personal history of nicotine dependence: Secondary | ICD-10-CM | POA: Insufficient documentation

## 2019-07-10 DIAGNOSIS — N183 Chronic kidney disease, stage 3 unspecified: Secondary | ICD-10-CM | POA: Diagnosis not present

## 2019-07-10 DIAGNOSIS — I509 Heart failure, unspecified: Secondary | ICD-10-CM | POA: Insufficient documentation

## 2019-07-10 DIAGNOSIS — Z170 Estrogen receptor positive status [ER+]: Secondary | ICD-10-CM

## 2019-07-10 DIAGNOSIS — K219 Gastro-esophageal reflux disease without esophagitis: Secondary | ICD-10-CM | POA: Insufficient documentation

## 2019-07-10 DIAGNOSIS — E119 Type 2 diabetes mellitus without complications: Secondary | ICD-10-CM | POA: Diagnosis not present

## 2019-07-10 DIAGNOSIS — I130 Hypertensive heart and chronic kidney disease with heart failure and stage 1 through stage 4 chronic kidney disease, or unspecified chronic kidney disease: Secondary | ICD-10-CM | POA: Diagnosis not present

## 2019-07-10 DIAGNOSIS — I739 Peripheral vascular disease, unspecified: Secondary | ICD-10-CM | POA: Diagnosis not present

## 2019-07-10 DIAGNOSIS — Z8249 Family history of ischemic heart disease and other diseases of the circulatory system: Secondary | ICD-10-CM | POA: Insufficient documentation

## 2019-07-10 DIAGNOSIS — Z9842 Cataract extraction status, left eye: Secondary | ICD-10-CM | POA: Insufficient documentation

## 2019-07-10 DIAGNOSIS — I110 Hypertensive heart disease with heart failure: Secondary | ICD-10-CM | POA: Diagnosis not present

## 2019-07-10 DIAGNOSIS — Z90710 Acquired absence of both cervix and uterus: Secondary | ICD-10-CM | POA: Insufficient documentation

## 2019-07-10 DIAGNOSIS — Z9841 Cataract extraction status, right eye: Secondary | ICD-10-CM | POA: Diagnosis not present

## 2019-07-10 HISTORY — DX: Nausea with vomiting, unspecified: R11.2

## 2019-07-10 HISTORY — DX: Other specified postprocedural states: Z98.890

## 2019-07-10 HISTORY — PX: BREAST LUMPECTOMY WITH RADIOACTIVE SEED LOCALIZATION: SHX6424

## 2019-07-10 LAB — GLUCOSE, CAPILLARY
Glucose-Capillary: 159 mg/dL — ABNORMAL HIGH (ref 70–99)
Glucose-Capillary: 163 mg/dL — ABNORMAL HIGH (ref 70–99)

## 2019-07-10 SURGERY — BREAST LUMPECTOMY WITH RADIOACTIVE SEED LOCALIZATION
Anesthesia: General | Site: Breast | Laterality: Right

## 2019-07-10 MED ORDER — FENTANYL CITRATE (PF) 100 MCG/2ML IJ SOLN
INTRAMUSCULAR | Status: DC | PRN
  Administered 2019-07-10: 100 ug via INTRAVENOUS

## 2019-07-10 MED ORDER — FENTANYL CITRATE (PF) 100 MCG/2ML IJ SOLN
INTRAMUSCULAR | Status: AC
  Filled 2019-07-10: qty 2

## 2019-07-10 MED ORDER — OXYCODONE HCL 5 MG/5ML PO SOLN: 5.0000 mg | Freq: Once | ORAL | Status: DC | PRN

## 2019-07-10 MED ORDER — OXYCODONE HCL 5 MG PO TABS: 5.0000 mg | ORAL_TABLET | Freq: Once | ORAL | Status: DC | PRN

## 2019-07-10 MED ORDER — GABAPENTIN 300 MG PO CAPS: 300.0000 mg | ORAL_CAPSULE | ORAL | Status: DC

## 2019-07-10 MED ORDER — CHLORHEXIDINE GLUCONATE CLOTH 2 % EX PADS: 6.0000 | MEDICATED_PAD | Freq: Once | CUTANEOUS | Status: DC

## 2019-07-10 MED ORDER — ACETAMINOPHEN 500 MG PO TABS
ORAL_TABLET | ORAL | Status: AC
  Filled 2019-07-10: qty 2

## 2019-07-10 MED ORDER — DEXAMETHASONE SODIUM PHOSPHATE 4 MG/ML IJ SOLN
INTRAMUSCULAR | Status: DC | PRN
  Administered 2019-07-10: 5 mg via INTRAVENOUS

## 2019-07-10 MED ORDER — DEXAMETHASONE SODIUM PHOSPHATE 10 MG/ML IJ SOLN
INTRAMUSCULAR | Status: AC
  Filled 2019-07-10: qty 1

## 2019-07-10 MED ORDER — PROPOFOL 10 MG/ML IV BOLUS
INTRAVENOUS | Status: DC | PRN
  Administered 2019-07-10: 120 mg via INTRAVENOUS

## 2019-07-10 MED ORDER — CEFAZOLIN SODIUM-DEXTROSE 2-4 GM/100ML-% IV SOLN
INTRAVENOUS | Status: AC
  Filled 2019-07-10: qty 100

## 2019-07-10 MED ORDER — LACTATED RINGERS IV SOLN: INTRAVENOUS | Status: DC

## 2019-07-10 MED ORDER — BUPIVACAINE HCL (PF) 0.25 % IJ SOLN
INTRAMUSCULAR | Status: DC | PRN
  Administered 2019-07-10: 20 mL

## 2019-07-10 MED ORDER — CEFAZOLIN SODIUM-DEXTROSE 2-4 GM/100ML-% IV SOLN
2.0000 g | INTRAVENOUS | Status: AC
  Administered 2019-07-10: 2 g via INTRAVENOUS

## 2019-07-10 MED ORDER — LIDOCAINE 2% (20 MG/ML) 5 ML SYRINGE
INTRAMUSCULAR | Status: AC
  Filled 2019-07-10: qty 5

## 2019-07-10 MED ORDER — GABAPENTIN 300 MG PO CAPS
ORAL_CAPSULE | ORAL | Status: AC
  Filled 2019-07-10: qty 1

## 2019-07-10 MED ORDER — EPHEDRINE SULFATE-NACL 50-0.9 MG/10ML-% IV SOSY
PREFILLED_SYRINGE | INTRAVENOUS | Status: DC | PRN
  Administered 2019-07-10 (×2): 5 mg via INTRAVENOUS

## 2019-07-10 MED ORDER — FENTANYL CITRATE (PF) 100 MCG/2ML IJ SOLN: 25.0000 ug | INTRAMUSCULAR | Status: DC | PRN

## 2019-07-10 MED ORDER — LIDOCAINE 2% (20 MG/ML) 5 ML SYRINGE
INTRAMUSCULAR | Status: DC | PRN
  Administered 2019-07-10: 80 mg via INTRAVENOUS

## 2019-07-10 MED ORDER — ONDANSETRON HCL 4 MG/2ML IJ SOLN
INTRAMUSCULAR | Status: DC | PRN
  Administered 2019-07-10: 4 mg via INTRAVENOUS

## 2019-07-10 MED ORDER — ONDANSETRON HCL 4 MG/2ML IJ SOLN
INTRAMUSCULAR | Status: AC
  Filled 2019-07-10: qty 2

## 2019-07-10 MED ORDER — CELECOXIB 200 MG PO CAPS: 200.0000 mg | ORAL_CAPSULE | ORAL | Status: DC

## 2019-07-10 MED ORDER — HYDROCODONE-ACETAMINOPHEN 5-325 MG PO TABS: 1.0000 | ORAL_TABLET | Freq: Four times a day (QID) | ORAL | 0 refills | Status: DC | PRN

## 2019-07-10 MED ORDER — PROPOFOL 10 MG/ML IV BOLUS
INTRAVENOUS | Status: AC
  Filled 2019-07-10: qty 20

## 2019-07-10 MED ORDER — EPHEDRINE 5 MG/ML INJ
INTRAVENOUS | Status: AC
  Filled 2019-07-10: qty 10

## 2019-07-10 MED ORDER — ACETAMINOPHEN 500 MG PO TABS
1000.0000 mg | ORAL_TABLET | ORAL | Status: AC
  Administered 2019-07-10: 1000 mg via ORAL

## 2019-07-10 MED ORDER — ONDANSETRON HCL 4 MG/2ML IJ SOLN: 4.0000 mg | Freq: Once | INTRAMUSCULAR | Status: DC | PRN

## 2019-07-10 SURGICAL SUPPLY — 40 items
APPLIER CLIP 9.375 MED OPEN (MISCELLANEOUS)
BLADE SURG 15 STRL LF DISP TIS (BLADE) ×1 IMPLANT
BLADE SURG 15 STRL SS (BLADE) ×2
CANISTER SUC SOCK COL 7IN (MISCELLANEOUS) ×3 IMPLANT
CANISTER SUCT 1200ML W/VALVE (MISCELLANEOUS) ×3 IMPLANT
CHLORAPREP W/TINT 26 (MISCELLANEOUS) ×3 IMPLANT
CLIP APPLIE 9.375 MED OPEN (MISCELLANEOUS) IMPLANT
COVER BACK TABLE REUSABLE LG (DRAPES) ×3 IMPLANT
COVER MAYO STAND REUSABLE (DRAPES) ×3 IMPLANT
COVER PROBE W GEL 5X96 (DRAPES) ×3 IMPLANT
COVER WAND RF STERILE (DRAPES) IMPLANT
DECANTER SPIKE VIAL GLASS SM (MISCELLANEOUS) IMPLANT
DERMABOND ADVANCED (GAUZE/BANDAGES/DRESSINGS) ×2
DERMABOND ADVANCED .7 DNX12 (GAUZE/BANDAGES/DRESSINGS) ×1 IMPLANT
DRAPE LAPAROSCOPIC ABDOMINAL (DRAPES) ×3 IMPLANT
DRAPE UTILITY XL STRL (DRAPES) ×3 IMPLANT
ELECT COATED BLADE 2.86 ST (ELECTRODE) ×3 IMPLANT
ELECT REM PT RETURN 9FT ADLT (ELECTROSURGICAL) ×3
ELECTRODE REM PT RTRN 9FT ADLT (ELECTROSURGICAL) ×1 IMPLANT
GLOVE BIO SURGEON STRL SZ7.5 (GLOVE) ×6 IMPLANT
GOWN STRL REUS W/ TWL LRG LVL3 (GOWN DISPOSABLE) ×2 IMPLANT
GOWN STRL REUS W/TWL LRG LVL3 (GOWN DISPOSABLE) ×4
ILLUMINATOR WAVEGUIDE N/F (MISCELLANEOUS) IMPLANT
KIT MARKER MARGIN INK (KITS) ×3 IMPLANT
LIGHT WAVEGUIDE WIDE FLAT (MISCELLANEOUS) IMPLANT
NEEDLE HYPO 25X1 1.5 SAFETY (NEEDLE) IMPLANT
NS IRRIG 1000ML POUR BTL (IV SOLUTION) IMPLANT
PACK BASIN DAY SURGERY FS (CUSTOM PROCEDURE TRAY) ×3 IMPLANT
PENCIL SMOKE EVACUATOR (MISCELLANEOUS) ×3 IMPLANT
SLEEVE SCD COMPRESS KNEE MED (MISCELLANEOUS) ×3 IMPLANT
SPONGE LAP 18X18 RF (DISPOSABLE) ×3 IMPLANT
SUT MON AB 4-0 PC3 18 (SUTURE) ×3 IMPLANT
SUT SILK 2 0 SH (SUTURE) IMPLANT
SUT VICRYL 3-0 CR8 SH (SUTURE) ×3 IMPLANT
SYR CONTROL 10ML LL (SYRINGE) IMPLANT
TOWEL GREEN STERILE FF (TOWEL DISPOSABLE) ×3 IMPLANT
TRAY FAXITRON CT DISP (TRAY / TRAY PROCEDURE) ×3 IMPLANT
TUBE CONNECTING 20'X1/4 (TUBING) ×1
TUBE CONNECTING 20X1/4 (TUBING) ×2 IMPLANT
YANKAUER SUCT BULB TIP NO VENT (SUCTIONS) IMPLANT

## 2019-07-10 NOTE — Anesthesia Postprocedure Evaluation (Signed)
Anesthesia Post Note  Patient: Carla Foster  Procedure(s) Performed: RIGHT BREAST LUMPECTOMY WITH RADIOACTIVE SEED LOCALIZATION (Right Breast)     Patient location during evaluation: PACU Anesthesia Type: General Level of consciousness: awake and alert and oriented Pain management: pain level controlled Vital Signs Assessment: post-procedure vital signs reviewed and stable Respiratory status: spontaneous breathing, nonlabored ventilation and respiratory function stable Cardiovascular status: blood pressure returned to baseline and stable Postop Assessment: no apparent nausea or vomiting Anesthetic complications: no    Last Vitals:  Vitals:   07/10/19 1251 07/10/19 1301  BP: (!) 143/58 (!) 159/58  Pulse:    Resp:    Temp:  36.5 C  SpO2: 98% 96%    Last Pain:  Vitals:   07/10/19 1245  TempSrc:   PainSc: 2                  Nafeesa Dils A.

## 2019-07-10 NOTE — Anesthesia Preprocedure Evaluation (Addendum)
Anesthesia Evaluation  Patient identified by MRN, date of birth, ID band Patient awake    Reviewed: Allergy & Precautions, NPO status , Patient's Chart, lab work & pertinent test results, reviewed documented beta blocker date and time   History of Anesthesia Complications (+) PONV and history of anesthetic complications  Airway Mallampati: II  TM Distance: >3 FB Neck ROM: Full    Dental  (+) Missing   Pulmonary asthma , pneumonia, resolved, former smoker,    Pulmonary exam normal breath sounds clear to auscultation       Cardiovascular hypertension, Pt. on medications + Peripheral Vascular Disease and +CHF  Normal cardiovascular exam Rhythm:Regular Rate:Normal     Neuro/Psych HOH negative psych ROS   GI/Hepatic PUD, GERD  Medicated and Controlled,  Endo/Other  diabetes, Poorly Controlled, Type 2, Oral Hypoglycemic AgentsHyperlipidemia  Renal/GU Renal InsufficiencyRenal disease  negative genitourinary   Musculoskeletal negative musculoskeletal ROS (+)   Abdominal   Peds  Hematology  (+) anemia ,   Anesthesia Other Findings   Reproductive/Obstetrics                            Anesthesia Physical Anesthesia Plan  ASA: III  Anesthesia Plan: General   Post-op Pain Management:    Induction: Intravenous  PONV Risk Score and Plan: 4 or greater and Ondansetron, Treatment may vary due to age or medical condition and Dexamethasone  Airway Management Planned: LMA  Additional Equipment:   Intra-op Plan:   Post-operative Plan: Extubation in OR  Informed Consent: I have reviewed the patients History and Physical, chart, labs and discussed the procedure including the risks, benefits and alternatives for the proposed anesthesia with the patient or authorized representative who has indicated his/her understanding and acceptance.     Dental advisory given  Plan Discussed with: CRNA and  Surgeon  Anesthesia Plan Comments:         Anesthesia Quick Evaluation

## 2019-07-10 NOTE — Transfer of Care (Signed)
Immediate Anesthesia Transfer of Care Note  Patient: Carla Foster  Procedure(s) Performed: RIGHT BREAST LUMPECTOMY WITH RADIOACTIVE SEED LOCALIZATION (Right Breast)  Patient Location: PACU  Anesthesia Type:General  Level of Consciousness: drowsy  Airway & Oxygen Therapy: Patient Spontanous Breathing and Patient connected to face mask oxygen  Post-op Assessment: Report given to RN and Post -op Vital signs reviewed and stable  Post vital signs: Reviewed and stable  Last Vitals:  Vitals Value Taken Time  BP 144/57 07/10/19 1215  Temp    Pulse 76 07/10/19 1218  Resp 15 07/10/19 1218  SpO2 100 % 07/10/19 1218  Vitals shown include unvalidated device data.  Last Pain:  Vitals:   07/10/19 1037  TempSrc: Oral  PainSc: 0-No pain         Complications: No apparent anesthesia complications

## 2019-07-10 NOTE — Op Note (Signed)
07/10/2019  12:10 PM  PATIENT:  Carla Foster  78 y.o. female  PRE-OPERATIVE DIAGNOSIS:  Right breast cancer  POST-OPERATIVE DIAGNOSIS:  Right breast cancer  PROCEDURE:  Procedure(s): RIGHT BREAST LUMPECTOMY WITH RADIOACTIVE SEED LOCALIZATION (Right)  SURGEON:  Surgeon(s) and Role:    * Jovita Kussmaul, MD - Primary  PHYSICIAN ASSISTANT:   ASSISTANTS: none   ANESTHESIA:   local and general  EBL:  minimal   BLOOD ADMINISTERED:none  DRAINS: none   LOCAL MEDICATIONS USED:  MARCAINE     SPECIMEN:  Source of Specimen:  right breast tissue with additional superior margin  DISPOSITION OF SPECIMEN:  PATHOLOGY  COUNTS:  YES  TOURNIQUET:  * No tourniquets in log *  DICTATION: .Dragon Dictation   After informed consent was obtained the patient was brought to the operating room and placed in the supine position on the operating table.  After adequate induction of general anesthesia the patient's right breast was prepped with ChloraPrep, allowed to dry, and draped in usual sterile manner.  An appropriate timeout was performed.  Previously an I-125 seed was placed in the upper outer quadrant of the right breast to mark an area of invasive breast cancer.  The neoprobe was set to I-125 in the area of radioactivity was readily identified.  The area around this was infiltrated with quarter percent Marcaine.  An elliptical incision was made in the skin overlying the area of radioactivity with a 15 blade knife.  The incision was carried through the skin and subcutaneous tissue sharply with the electrocautery.  Dissection was then carried around the radioactive seed while checking the area of radioactivity frequently.  This dissection was carried all the way to the chest wall.  Once the specimen was removed it was oriented with the appropriate paint colors.  A specimen radiograph was obtained that showed the clip and seed to be near the center of the specimen.  The specimen was then sent to  pathology for further evaluation.  I did feel like I was a little close on the superior edge so I did remove an additional superior edge of the cavity and marked this appropriately.  Hemostasis was achieved using the Bovie electrocautery.  The wound was irrigated with saline and infiltrated with more quarter percent Marcaine.  The cavity was marked with clips.  The deep layer of the wound was then closed with layers of interrupted 3-0 Vicryl stitches.  The skin was closed with a running 4-0 Monocryl subcuticular stitch.  Dermabond dressings were applied.  The patient tolerated the procedure well.  At the end of the case all needle sponge and instrument counts were correct.  The patient was then awakened and taken to recovery in stable condition.  PLAN OF CARE: Discharge to home after PACU  PATIENT DISPOSITION:  PACU - hemodynamically stable.   Delay start of Pharmacological VTE agent (>24hrs) due to surgical blood loss or risk of bleeding: not applicable

## 2019-07-10 NOTE — H&P (Signed)
Annitta Jersey Losh  Location: [facility] Patient #: [id number] DOB: 1941-03-03 Undefined / Language: Cleophus Molt / Race: White Female   History of Present Illness  The patient is a 78 year old female who presents with breast cancer. We are asked to see the patient in consultation by Dr. Jana Hakim to evaluate her for a new right breast cancer. The patient is a 78 year old white female who presents with a screen detected mass in the UOQ of the right breast that measured 0.9cm with a negative axilla. It was biopsied and came back as a IDC that was ER + and PR - and Her2- with a Ki67 of 15%. She has no family hx of breast cancer. She does have CHF and is followed by Dr. Acie Fredrickson.   Past Surgical History  Breast Biopsy  Right. Cataract Surgery  Bilateral. Hysterectomy (not due to cancer) - Complete   Diagnostic Studies History Colonoscopy  within last year Mammogram  within last year Pap Smear  1-5 years ago  Medication History Medications Reconciled  Social History  Caffeine use  Coffee. No alcohol use  No drug use  Tobacco use  Former smoker.  Family History  Arthritis  Mother. Cerebrovascular Accident  Mother. Hypertension  Mother.  Pregnancy / Birth History  Age at menarche  22 years. Contraceptive History  Oral contraceptives. Length (months) of breastfeeding  3-6 Maternal age  13-20 Para  3  Other Problems  Anxiety Disorder  Asthma  Breast Cancer  Congestive Heart Failure  Diabetes Mellitus  High blood pressure  Hypercholesterolemia  Lump In Breast     Review of Systems  General Present- Night Sweats. Not Present- Appetite Loss, Chills, Fatigue, Fever, Weight Gain and Weight Loss. Skin Present- Dryness. Not Present- Change in Wart/Mole, Hives, Jaundice, New Lesions, Non-Healing Wounds, Rash and Ulcer. HEENT Present- Hearing Loss, Nose Bleed and Sinus Pain. Not Present- Earache, Hoarseness, Oral Ulcers, Ringing in the Ears, Seasonal  Allergies, Sore Throat, Visual Disturbances, Wears glasses/contact lenses and Yellow Eyes. Respiratory Present- Bloody sputum, Difficulty Breathing and Wheezing. Not Present- Chronic Cough and Snoring. Cardiovascular Present- Leg Cramps, Shortness of Breath and Swelling of Extremities. Not Present- Chest Pain, Difficulty Breathing Lying Down, Palpitations and Rapid Heart Rate. Gastrointestinal Not Present- Abdominal Pain, Bloating, Bloody Stool, Change in Bowel Habits, Chronic diarrhea, Constipation, Difficulty Swallowing, Excessive gas, Gets full quickly at meals, Hemorrhoids, Indigestion, Nausea, Rectal Pain and Vomiting. Neurological Present- Trouble walking. Not Present- Decreased Memory, Fainting, Headaches, Numbness, Seizures, Tingling, Tremor and Weakness. Psychiatric Present- Anxiety. Not Present- Bipolar, Change in Sleep Pattern, Depression, Fearful and Frequent crying. Endocrine Not Present- Cold Intolerance, Excessive Hunger, Hair Changes, Heat Intolerance, Hot flashes and New Diabetes. Hematology Not Present- Blood Thinners, Easy Bruising, Excessive bleeding, Gland problems, HIV and Persistent Infections.   Physical Exam  General Mental Status-Alert. General Appearance-Consistent with stated age. Hydration-Well hydrated. Voice-Normal.  Head and Neck Head-normocephalic, atraumatic with no lesions or palpable masses. Trachea-midline. Thyroid Gland Characteristics - normal size and consistency.  Eye Eyeball - Bilateral-Extraocular movements intact. Sclera/Conjunctiva - Bilateral-No scleral icterus.  Chest and Lung Exam Chest and lung exam reveals -quiet, even and easy respiratory effort with no use of accessory muscles and on auscultation, normal breath sounds, no adventitious sounds and normal vocal resonance. Inspection Chest Wall - Normal. Back - normal.  Breast Note: there is no palpable mass in either breast. There is no palpable axillary,  supraclavicular, or cervical lymphadenopathy   Cardiovascular Cardiovascular examination reveals -normal heart sounds, regular  rate and rhythm with no murmurs and normal pedal pulses bilaterally.  Abdomen Inspection Inspection of the abdomen reveals - No Hernias. Skin - Scar - no surgical scars. Palpation/Percussion Palpation and Percussion of the abdomen reveal - Soft, Non Tender, No Rebound tenderness, No Rigidity (guarding) and No hepatosplenomegaly. Auscultation Auscultation of the abdomen reveals - Bowel sounds normal.  Neurologic Neurologic evaluation reveals -alert and oriented x 3 with no impairment of recent or remote memory. Mental Status-Normal.  Musculoskeletal Normal Exam - Left-Upper Extremity Strength Normal and Lower Extremity Strength Normal. Normal Exam - Right-Upper Extremity Strength Normal and Lower Extremity Strength Normal.  Lymphatic Head & Neck  General Head & Neck Lymphatics: Bilateral - Description - Normal. Axillary  General Axillary Region: Bilateral - Description - Normal. Tenderness - Non Tender. Femoral & Inguinal  Generalized Femoral & Inguinal Lymphatics: Bilateral - Description - Normal. Tenderness - Non Tender.    Assessment & Plan  MALIGNANT NEOPLASM OF UPPER-OUTER QUADRANT OF RIGHT BREAST IN FEMALE, ESTROGEN RECEPTOR POSITIVE (C50.411) Impression: The patient appears to have a small stage I cancer in the UOQ of the right breast. I have discussed with her the different options for treatment and at this point she favors breast conservation which I feel is a good way to treat her cancer. Given her age and medical problems she will not need a node evaluation. I have discussed with her in detail the risks and benefits of the operation as well as some of the technical aspects including the use of a radioactive seed for localization and she understands and wishes to proceed. I will plan to get cardiac clearance prior to scheduling Current  Plans Referred to Oncology, for evaluation and follow up (Oncology). Routine.

## 2019-07-10 NOTE — Anesthesia Procedure Notes (Signed)
Procedure Name: LMA Insertion Date/Time: 07/10/2019 11:27 AM Performed by: Candis Shine, CRNA Pre-anesthesia Checklist: Patient identified, Emergency Drugs available, Suction available and Patient being monitored Patient Re-evaluated:Patient Re-evaluated prior to induction Oxygen Delivery Method: Circle System Utilized Preoxygenation: Pre-oxygenation with 100% oxygen Induction Type: IV induction LMA: LMA inserted LMA Size: 4.0 Number of attempts: 1 Placement Confirmation: positive ETCO2 Tube secured with: Tape Dental Injury: Teeth and Oropharynx as per pre-operative assessment

## 2019-07-17 ENCOUNTER — Encounter: Payer: Self-pay | Admitting: *Deleted

## 2019-07-17 LAB — SURGICAL PATHOLOGY

## 2019-07-17 NOTE — Progress Notes (Signed)
Location of Breast Cancer: Right Breast  Histology per Pathology Report:  05/31/19 Diagnosis Breast, right, needle core biopsy, 10 o'clock, 5cmfn - INVASIVE MAMMARY CARCINOMA, SEE COMMENT.  Receptor Status: ER(70%), PR (NEG), Her2-neu (NEG), Ki-(15%)  07/10/19 FINAL MICROSCOPIC DIAGNOSIS: A. BREAST, RIGHT, LUMPECTOMY: - Invasive ductal carcinoma, 1.2 cm, Nottingham grade 2 of 3. - Margins of resection are not involved (Closest margin: 1 mm, inferior). - Ductal carcinoma in situ. - Biopsy site changes. - See oncology table. B. BREAST, RIGHT, EXCISION OF ADDITIONAL SUPERIOR MARGIN: - Fibrous and adipose tissue, negative for carcinoma.  Did patient present with symptoms or was this found on screening mammography?: It was discovered on a screening mammogram.   Past/Anticipated interventions by surgeon, if any: 07/10/19 PROCEDURE:  Procedure(s): RIGHT BREAST LUMPECTOMY WITH RADIOACTIVE SEED LOCALIZATION (Right) SURGEON:  Surgeon(s) and Role:    Jovita Kussmaul, MD - Primary  Past/Anticipated interventions by medical oncology, if any:  06/07/19 Dr. [facility] The overall plan then is for surgery, radiation, and consideration of anastrozole.  In that regard note that the patient's most recent bone density is nearly normal.  Lymphedema issues, if any:  She denies. She has good arm mobility.   Pain issues, if any:  She denies.   SAFETY ISSUES:  Prior radiation? No  Pacemaker/ICD? No  Possible current pregnancy? No  Is the patient on methotrexate? No  Current Complaints / other details:    BP (!) 157/50   Pulse 76   Temp 97.8 F (36.6 C) (Oral)   Resp 20   Wt 158 lb (71.7 kg)   SpO2 95%   BMI 25.50 kg/m    Wt Readings from Last 3 Encounters:  07/25/19 158 lb (71.7 kg)  07/10/19 155 lb 3.3 oz (70.4 kg)  06/07/19 158 lb (71.7 kg)      Tor Tsuda, Stephani Police, RN 07/17/2019,9:40 AM

## 2019-07-18 ENCOUNTER — Encounter: Payer: Self-pay | Admitting: *Deleted

## 2019-07-24 NOTE — Progress Notes (Signed)
Radiation Oncology         [phone] ________________________________  Name: Carla Foster MRN: [id number]  Date: 07/25/2019  DOB: 10/17/1940  Follow-Up Visit Note in person  Outpatient  CC: Leamon Arnt, MD  Magrinat, Virgie Dad, MD  Diagnosis:      ICD-10-CM   1. Malignant neoplasm of upper-outer quadrant of right breast in female, estrogen receptor positive ([place])  C50.411    Z17.0      Cancer Staging Malignant neoplasm of upper-outer quadrant of right breast in female, estrogen receptor positive ([place]) Staging form: Breast, AJCC 8th Edition - Clinical stage from 06/07/2019: Stage IA (cT1b, cN0, cM0, G2, ER+, PR-, HER2-) - Unsigned - Pathologic stage from 07/25/2019: Stage Unknown (pT1c, pNX, cM0, G2, ER+, PR-, HER2-) - Signed by Eppie Gibson, MD on 07/25/2019   CHIEF COMPLAINT: Here to discuss management of right breast cancer  Narrative:  The patient returns today for follow-up to discuss radiation treatment options. She was seen in the multidisciplinary breast clinic on 06/07/2019.     She opted to proceed with right lumpectomy on date of 07/10/2019 with pathology report revealing: tumor size of 1.2 cm; histology of ductal carcinoma; margin status to invasive disease of 1 mm and margin status to in situ disease of 2 mm; Grade 2. Prognostic panel was not repeated.  Symptomatically, the patient reports: healing/doing well. Here with daughter Lymphedema issues, if any:  She denies. She has good arm mobility.   Pain issues, if any:  She denies.   SAFETY ISSUES:  Prior radiation? No  Pacemaker/ICD? No  Possible current pregnancy? No  Is the patient on methotrexate? No  Anticipates possible anastrozole after RT.           ALLERGIES:  is allergic to tiazac [diltiazem hcl].  Meds: Current Outpatient Medications  Medication Sig Dispense Refill  . albuterol (VENTOLIN HFA) 108 (90 Base) MCG/ACT inhaler Inhale 2 puffs into the lungs every 6 (six) hours as needed for  wheezing or shortness of breath. 18 g 3  . amLODipine (NORVASC) 5 MG tablet Take 1 tablet (5 mg total) by mouth daily. 90 tablet 3  . Calcium Carbonate-Vitamin D (CALCIUM 600 + D PO) Take 1 tablet by mouth daily.      . carvedilol (COREG) 25 MG tablet Take 1 tablet (25 mg total) by mouth 2 (two) times daily. 180 tablet 3  . doxazosin (CARDURA) 4 MG tablet Take 1 tablet (4 mg total) by mouth at bedtime. 90 tablet 3  . fenofibrate 160 MG tablet Take 1 tablet (160 mg total) by mouth daily. 90 tablet 3  . ferrous sulfate 325 (65 FE) MG tablet Take 1 tablet (325 mg total) by mouth daily. 90 tablet 3  . furosemide (LASIX) 40 MG tablet Take 0.5 tablets (20 mg total) by mouth daily. 30 tablet 0  . losartan (COZAAR) 50 MG tablet Take 1 tablet (50 mg total) by mouth daily. 90 tablet 3  . metFORMIN (GLUCOPHAGE) 1000 MG tablet Take 1 tablet (1,000 mg total) by mouth daily. 90 tablet 3  . Propylene Glycol (SYSTANE BALANCE) 0.6 % SOLN Apply 1 drop to eye as needed (dry eyes).    . simvastatin (ZOCOR) 20 MG tablet Take 1 tablet (20 mg total) by mouth at bedtime. 90 tablet 3  . vitamin A 8000 UNIT capsule Take 8,000 Units by mouth daily.     . vitamin C (ASCORBIC ACID) 500 MG tablet Take 500 mg by mouth daily.      Marland Kitchen  HYDROcodone-acetaminophen (NORCO/VICODIN) 5-325 MG tablet Take 1 tablet by mouth every 6 (six) hours as needed for moderate pain or severe pain. (Patient not taking: Reported on 07/25/2019) 10 tablet 0   No current facility-administered medications for this encounter.    Physical Findings:  weight is 158 lb (71.7 kg). Her oral temperature is 97.8 F (36.6 C). Her blood pressure is 157/50 (abnormal) and her pulse is 76. Her respiration is 20 and oxygen saturation is 95%. .     General: Alert and oriented, in no acute distress Psychiatric: Judgment and insight are intact. Affect is appropriate. Breast exam reveals excellent healing at right breast lumpectomy scar  Lab Findings: Lab Results    Component Value Date   WBC 8.0 06/07/2019   HGB 10.0 (L) 06/07/2019   HCT 31.2 (L) 06/07/2019   MCV 91.5 06/07/2019   PLT 270 06/07/2019    Radiographic Findings: MM Breast Surgical Specimen  Result Date: 07/10/2019 CLINICAL DATA:  Post right breast lumpectomy. EXAM: SPECIMEN RADIOGRAPH OF THE RIGHT BREAST COMPARISON:  Previous exam(s). FINDINGS: Status post excision of the right breast. The radioactive seed and biopsy marker clip are present, completely intact, and were marked for pathology. IMPRESSION: Specimen radiograph of the right breast. Electronically Signed   By: Everlean Alstrom M.D.   On: 07/10/2019 11:58   MM RT RADIOACTIVE SEED LOC MAMMO GUIDE  Result Date: 07/10/2019 CLINICAL DATA:  79 year old female with recently diagnosed right breast cancer presents for radioactive seed localization. EXAM: MAMMOGRAPHIC GUIDED RADIOACTIVE SEED LOCALIZATION OF THE RIGHT BREAST COMPARISON:  Previous exam(s). FINDINGS: Patient presents for radioactive seed localization prior to right breast lumpectomy. I met with the patient and we discussed the procedure of seed localization including benefits and alternatives. We discussed the high likelihood of a successful procedure. We discussed the risks of the procedure including infection, bleeding, tissue injury and further surgery. We discussed the low dose of radioactivity involved in the procedure. Informed, written consent was given. The usual time-out protocol was performed immediately prior to the procedure. Using mammographic guidance, sterile technique, 1% lidocaine and an I-125 radioactive seed, the mass with the adjacent wing shaped biopsy marking clip was localized using a lateral to medial approach. Note that the biopsy marking clip is located approximately 1.5 cm inferior to the mass. The follow-up mammogram images confirm the seed in the expected location and were marked for Dr. Marlou Starks. Follow-up survey of the patient confirms presence of the  radioactive seed. Order number of I-125 seed:  [id number]. Total activity:  7.672 millicuries reference Date: 06/19/2019 The patient tolerated the procedure well and was released from the [facility]. She was given instructions regarding seed removal. IMPRESSION: Radioactive seed localization right breast. The radioactive seed is at the site of the biopsied mass, with the biopsy marking clip located approximately 1.5 cm inferior to the biopsied mass and radioactive seed. No apparent complications. Electronically Signed   By: Everlean Alstrom M.D.   On: 07/10/2019 10:07    Impression/Plan: Right Breast Cancer  We discussed adjuvant radiotherapy today.  I recommend 4 weeks directed at the right breast and axilla (high tangents) in order to reduce the risk of locoregional recurrence by 2/3.  The risks, benefits and side effects of this treatment were discussed in detail.  She understands that radiotherapy is associated with skin irritation and fatigue in the acute setting. Late effects can include cosmetic changes and rare injury to internal organs.  She is enthusiastic about proceeding with treatment. A consent form  has been signed and placed in her chart.  Simulation to occur later this AM.   On the date of service, I spent 30 minutes of total time on this encounter.  _____________________________________   Eppie Gibson, MD   This document serves as a record of services personally performed by Eppie Gibson, MD. It was created on her behalf by Wilburn Mylar, a trained medical scribe. The creation of this record is based on the scribe's personal observations and the provider's statements to them. This document has been checked and approved by the attending provider.

## 2019-07-25 ENCOUNTER — Ambulatory Visit
Admission: RE | Admit: 2019-07-25 | Discharge: 2019-07-25 | Disposition: A | Discharge status: Home / Self Care | Payer: Medicare Other | Source: Ambulatory Visit | Attending: Radiation Oncology | Admitting: Radiation Oncology

## 2019-07-25 ENCOUNTER — Other Ambulatory Visit: Payer: Self-pay

## 2019-07-25 ENCOUNTER — Encounter: Payer: Self-pay | Admitting: Radiation Oncology

## 2019-07-25 DIAGNOSIS — Z170 Estrogen receptor positive status [ER+]: Secondary | ICD-10-CM

## 2019-07-25 DIAGNOSIS — C50411 Malignant neoplasm of upper-outer quadrant of right female breast: Secondary | ICD-10-CM | POA: Insufficient documentation

## 2019-07-25 DIAGNOSIS — Z79899 Other long term (current) drug therapy: Secondary | ICD-10-CM | POA: Diagnosis not present

## 2019-07-25 DIAGNOSIS — Z9889 Other specified postprocedural states: Secondary | ICD-10-CM | POA: Diagnosis not present

## 2019-07-25 DIAGNOSIS — Z7984 Long term (current) use of oral hypoglycemic drugs: Secondary | ICD-10-CM | POA: Insufficient documentation

## 2019-07-25 DIAGNOSIS — Z510 Encounter for antineoplastic radiation therapy: Secondary | ICD-10-CM | POA: Diagnosis not present

## 2019-07-25 NOTE — Progress Notes (Signed)
  Radiation Oncology         [phone] ________________________________  Name: Carla Foster MRN: [id number]  Date: 07/25/2019  DOB: August 22, 1940  SIMULATION AND TREATMENT PLANNING NOTE    Outpatient  DIAGNOSIS:     ICD-10-CM   1. Malignant neoplasm of upper-outer quadrant of right breast in female, estrogen receptor positive ([place])  C50.411    Z17.0     NARRATIVE:  The patient was brought to the [facility].  Identity was confirmed.  All relevant records and images related to the planned course of therapy were reviewed.  The patient freely provided informed written consent to proceed with treatment after reviewing the details related to the planned course of therapy. The consent form was witnessed and verified by the simulation staff.    Then, the patient was set-up in a stable reproducible supine position for radiation therapy with her ipsilateral arm over her head, and her upper body secured in a custom-made Vac-lok device.  CT images were obtained.  Surface markings were placed.  The CT images were loaded into the planning software.    TREATMENT PLANNING NOTE: Treatment planning then occurred.  The radiation prescription was entered and confirmed.     A total of 3 medically necessary complex treatment devices were fabricated and supervised by me: 2 fields with MLCs for custom blocks to protect heart, and lungs;  and, a Vac-lok. MORE COMPLEX DEVICES MAY BE MADE IN DOSIMETRY FOR FIELD IN FIELD BEAMS FOR DOSE HOMOGENEITY.  I have requested : 3D Simulation which is medically necessary to give adequate dose to at risk tissues while sparing lungs and heart.  I have requested a DVH of the following structures: lungs, heart, right lumpectomy cavity.    The patient will receive 40.05 Gy in 15 fractions to the right breast and axilla with 2 high tangential fields.  This will be followed by a boost.  Optical Surface Tracking Plan:  Since intensity modulated radiotherapy (IMRT) and  3D conformal radiation treatment methods are predicated on accurate and precise positioning for treatment, intrafraction motion monitoring is medically necessary to ensure accurate and safe treatment delivery. The ability to quantify intrafraction motion without excessive ionizing radiation dose can only be performed with optical surface tracking. Accordingly, surface imaging offers the opportunity to obtain 3D measurements of patient position throughout IMRT and 3D treatments without excessive radiation exposure. I am ordering optical surface tracking for this patient's upcoming course of radiotherapy.  ________________________________   Reference:  Ursula Alert, J, et al. Surface imaging-based analysis of intrafraction motion for breast radiotherapy patients.Journal of [place], n. 6, nov. 2014. ISSN [id number].  Available at: <http://www.jacmp.org/index.php/jacmp/article/view/4957>.    -----------------------------------  Eppie Gibson, MD

## 2019-07-26 ENCOUNTER — Encounter: Payer: Self-pay | Admitting: Radiation Oncology

## 2019-07-26 ENCOUNTER — Encounter: Payer: Self-pay | Admitting: *Deleted

## 2019-07-27 ENCOUNTER — Telehealth: Payer: Self-pay | Admitting: Oncology

## 2019-07-27 ENCOUNTER — Other Ambulatory Visit: Payer: Self-pay

## 2019-07-27 NOTE — Telephone Encounter (Signed)
Scheduled per 1/6 sch msg. Called and spoke with pt, confirmed 2/9 appt. Mailing printout

## 2019-07-28 ENCOUNTER — Ambulatory Visit: Payer: Medicare Other | Admitting: Family Medicine

## 2019-07-31 DIAGNOSIS — Z510 Encounter for antineoplastic radiation therapy: Secondary | ICD-10-CM | POA: Diagnosis not present

## 2019-07-31 DIAGNOSIS — C50411 Malignant neoplasm of upper-outer quadrant of right female breast: Secondary | ICD-10-CM | POA: Diagnosis not present

## 2019-07-31 DIAGNOSIS — Z170 Estrogen receptor positive status [ER+]: Secondary | ICD-10-CM | POA: Diagnosis not present

## 2019-07-31 DIAGNOSIS — E119 Type 2 diabetes mellitus without complications: Secondary | ICD-10-CM | POA: Diagnosis not present

## 2019-07-31 DIAGNOSIS — Z961 Presence of intraocular lens: Secondary | ICD-10-CM | POA: Diagnosis not present

## 2019-07-31 LAB — HM DIABETES EYE EXAM

## 2019-08-06 ENCOUNTER — Other Ambulatory Visit: Payer: Self-pay | Admitting: Family Medicine

## 2019-08-07 ENCOUNTER — Encounter: Payer: Self-pay | Admitting: Family Medicine

## 2019-08-07 ENCOUNTER — Encounter: Payer: Self-pay | Admitting: *Deleted

## 2019-08-07 ENCOUNTER — Ambulatory Visit
Admission: RE | Admit: 2019-08-07 | Discharge: 2019-08-07 | Disposition: A | Discharge status: Home / Self Care | Payer: Medicare Other | Source: Ambulatory Visit | Attending: Radiation Oncology | Admitting: Radiation Oncology

## 2019-08-07 ENCOUNTER — Other Ambulatory Visit: Payer: Self-pay

## 2019-08-07 DIAGNOSIS — Z170 Estrogen receptor positive status [ER+]: Secondary | ICD-10-CM

## 2019-08-07 DIAGNOSIS — C50411 Malignant neoplasm of upper-outer quadrant of right female breast: Secondary | ICD-10-CM

## 2019-08-07 DIAGNOSIS — Z510 Encounter for antineoplastic radiation therapy: Secondary | ICD-10-CM | POA: Diagnosis not present

## 2019-08-07 MED ORDER — SONAFINE EX EMUL
1.0000 "application " | Freq: Once | CUTANEOUS | Status: AC
  Administered 2019-08-07: 1 via TOPICAL

## 2019-08-07 MED ORDER — ALRA NON-METALLIC DEODORANT (RAD-ONC)
1.0000 "application " | Freq: Once | TOPICAL | Status: AC
  Administered 2019-08-07: 1 via TOPICAL

## 2019-08-07 NOTE — Progress Notes (Signed)

## 2019-08-08 ENCOUNTER — Ambulatory Visit
Admission: RE | Admit: 2019-08-08 | Discharge: 2019-08-08 | Disposition: A | Discharge status: Home / Self Care | Payer: Medicare Other | Source: Ambulatory Visit | Attending: Radiation Oncology | Admitting: Radiation Oncology

## 2019-08-08 ENCOUNTER — Other Ambulatory Visit: Payer: Self-pay

## 2019-08-08 DIAGNOSIS — Z170 Estrogen receptor positive status [ER+]: Secondary | ICD-10-CM | POA: Diagnosis not present

## 2019-08-08 DIAGNOSIS — C50411 Malignant neoplasm of upper-outer quadrant of right female breast: Secondary | ICD-10-CM | POA: Diagnosis not present

## 2019-08-08 DIAGNOSIS — Z510 Encounter for antineoplastic radiation therapy: Secondary | ICD-10-CM | POA: Diagnosis not present

## 2019-08-09 ENCOUNTER — Other Ambulatory Visit (HOSPITAL_COMMUNITY): Payer: Medicare Other

## 2019-08-09 ENCOUNTER — Other Ambulatory Visit: Payer: Self-pay

## 2019-08-09 ENCOUNTER — Ambulatory Visit
Admission: RE | Admit: 2019-08-09 | Discharge: 2019-08-09 | Disposition: A | Discharge status: Home / Self Care | Payer: Medicare Other | Source: Ambulatory Visit | Attending: Radiation Oncology | Admitting: Radiation Oncology

## 2019-08-09 DIAGNOSIS — Z170 Estrogen receptor positive status [ER+]: Secondary | ICD-10-CM | POA: Diagnosis not present

## 2019-08-09 DIAGNOSIS — Z510 Encounter for antineoplastic radiation therapy: Secondary | ICD-10-CM | POA: Diagnosis not present

## 2019-08-09 DIAGNOSIS — C50411 Malignant neoplasm of upper-outer quadrant of right female breast: Secondary | ICD-10-CM | POA: Diagnosis not present

## 2019-08-10 ENCOUNTER — Ambulatory Visit
Admission: RE | Admit: 2019-08-10 | Discharge: 2019-08-10 | Disposition: A | Discharge status: Home / Self Care | Payer: Medicare Other | Source: Ambulatory Visit | Attending: Radiation Oncology | Admitting: Radiation Oncology

## 2019-08-10 ENCOUNTER — Other Ambulatory Visit: Payer: Self-pay

## 2019-08-10 DIAGNOSIS — Z170 Estrogen receptor positive status [ER+]: Secondary | ICD-10-CM | POA: Diagnosis not present

## 2019-08-10 DIAGNOSIS — Z510 Encounter for antineoplastic radiation therapy: Secondary | ICD-10-CM | POA: Diagnosis not present

## 2019-08-10 DIAGNOSIS — C50411 Malignant neoplasm of upper-outer quadrant of right female breast: Secondary | ICD-10-CM | POA: Diagnosis not present

## 2019-08-11 ENCOUNTER — Ambulatory Visit
Admission: RE | Admit: 2019-08-11 | Discharge: 2019-08-11 | Disposition: A | Discharge status: Home / Self Care | Payer: Medicare Other | Source: Ambulatory Visit | Attending: Radiation Oncology | Admitting: Radiation Oncology

## 2019-08-11 ENCOUNTER — Other Ambulatory Visit: Payer: Self-pay

## 2019-08-11 ENCOUNTER — Encounter (HOSPITAL_COMMUNITY): Payer: Self-pay | Admitting: Radiology

## 2019-08-11 DIAGNOSIS — Z510 Encounter for antineoplastic radiation therapy: Secondary | ICD-10-CM | POA: Diagnosis not present

## 2019-08-11 DIAGNOSIS — Z170 Estrogen receptor positive status [ER+]: Secondary | ICD-10-CM | POA: Diagnosis not present

## 2019-08-11 DIAGNOSIS — C50411 Malignant neoplasm of upper-outer quadrant of right female breast: Secondary | ICD-10-CM | POA: Diagnosis not present

## 2019-08-14 ENCOUNTER — Other Ambulatory Visit: Payer: Self-pay

## 2019-08-14 ENCOUNTER — Ambulatory Visit
Admission: RE | Admit: 2019-08-14 | Discharge: 2019-08-14 | Disposition: A | Discharge status: Home / Self Care | Payer: Medicare Other | Source: Ambulatory Visit | Attending: Radiation Oncology | Admitting: Radiation Oncology

## 2019-08-14 DIAGNOSIS — C50411 Malignant neoplasm of upper-outer quadrant of right female breast: Secondary | ICD-10-CM | POA: Diagnosis not present

## 2019-08-14 DIAGNOSIS — Z170 Estrogen receptor positive status [ER+]: Secondary | ICD-10-CM | POA: Diagnosis not present

## 2019-08-14 DIAGNOSIS — Z510 Encounter for antineoplastic radiation therapy: Secondary | ICD-10-CM | POA: Diagnosis not present

## 2019-08-15 ENCOUNTER — Ambulatory Visit
Admission: RE | Admit: 2019-08-15 | Discharge: 2019-08-15 | Disposition: A | Discharge status: Home / Self Care | Payer: Medicare Other | Source: Ambulatory Visit | Attending: Radiation Oncology | Admitting: Radiation Oncology

## 2019-08-15 ENCOUNTER — Other Ambulatory Visit: Payer: Self-pay

## 2019-08-15 DIAGNOSIS — Z510 Encounter for antineoplastic radiation therapy: Secondary | ICD-10-CM | POA: Diagnosis not present

## 2019-08-15 DIAGNOSIS — C50411 Malignant neoplasm of upper-outer quadrant of right female breast: Secondary | ICD-10-CM | POA: Diagnosis not present

## 2019-08-15 DIAGNOSIS — Z170 Estrogen receptor positive status [ER+]: Secondary | ICD-10-CM | POA: Diagnosis not present

## 2019-08-16 ENCOUNTER — Other Ambulatory Visit: Payer: Self-pay

## 2019-08-16 ENCOUNTER — Ambulatory Visit
Admission: RE | Admit: 2019-08-16 | Discharge: 2019-08-16 | Disposition: A | Discharge status: Home / Self Care | Payer: Medicare Other | Source: Ambulatory Visit | Attending: Radiation Oncology | Admitting: Radiation Oncology

## 2019-08-16 DIAGNOSIS — C50411 Malignant neoplasm of upper-outer quadrant of right female breast: Secondary | ICD-10-CM | POA: Diagnosis not present

## 2019-08-16 DIAGNOSIS — Z510 Encounter for antineoplastic radiation therapy: Secondary | ICD-10-CM | POA: Diagnosis not present

## 2019-08-16 DIAGNOSIS — Z170 Estrogen receptor positive status [ER+]: Secondary | ICD-10-CM | POA: Diagnosis not present

## 2019-08-17 ENCOUNTER — Ambulatory Visit
Admission: RE | Admit: 2019-08-17 | Discharge: 2019-08-17 | Disposition: A | Discharge status: Home / Self Care | Payer: Medicare Other | Source: Ambulatory Visit | Attending: Radiation Oncology | Admitting: Radiation Oncology

## 2019-08-17 ENCOUNTER — Other Ambulatory Visit: Payer: Self-pay

## 2019-08-17 DIAGNOSIS — Z170 Estrogen receptor positive status [ER+]: Secondary | ICD-10-CM | POA: Diagnosis not present

## 2019-08-17 DIAGNOSIS — C50411 Malignant neoplasm of upper-outer quadrant of right female breast: Secondary | ICD-10-CM | POA: Diagnosis not present

## 2019-08-17 DIAGNOSIS — Z510 Encounter for antineoplastic radiation therapy: Secondary | ICD-10-CM | POA: Diagnosis not present

## 2019-08-18 ENCOUNTER — Ambulatory Visit
Admission: RE | Admit: 2019-08-18 | Discharge: 2019-08-18 | Disposition: A | Discharge status: Home / Self Care | Payer: Medicare Other | Source: Ambulatory Visit | Attending: Radiation Oncology | Admitting: Radiation Oncology

## 2019-08-18 DIAGNOSIS — Z510 Encounter for antineoplastic radiation therapy: Secondary | ICD-10-CM | POA: Diagnosis not present

## 2019-08-18 DIAGNOSIS — Z170 Estrogen receptor positive status [ER+]: Secondary | ICD-10-CM | POA: Diagnosis not present

## 2019-08-18 DIAGNOSIS — C50411 Malignant neoplasm of upper-outer quadrant of right female breast: Secondary | ICD-10-CM | POA: Diagnosis not present

## 2019-08-21 ENCOUNTER — Ambulatory Visit
Admission: RE | Admit: 2019-08-21 | Discharge: 2019-08-21 | Disposition: A | Discharge status: Home / Self Care | Payer: Medicare Other | Source: Ambulatory Visit | Attending: Radiation Oncology | Admitting: Radiation Oncology

## 2019-08-21 ENCOUNTER — Ambulatory Visit: Payer: Medicare Other | Admitting: Radiation Oncology

## 2019-08-21 ENCOUNTER — Other Ambulatory Visit: Payer: Self-pay

## 2019-08-21 DIAGNOSIS — C50411 Malignant neoplasm of upper-outer quadrant of right female breast: Secondary | ICD-10-CM | POA: Diagnosis not present

## 2019-08-21 DIAGNOSIS — Z170 Estrogen receptor positive status [ER+]: Secondary | ICD-10-CM | POA: Insufficient documentation

## 2019-08-21 DIAGNOSIS — Z510 Encounter for antineoplastic radiation therapy: Secondary | ICD-10-CM | POA: Insufficient documentation

## 2019-08-22 ENCOUNTER — Other Ambulatory Visit: Payer: Self-pay

## 2019-08-22 ENCOUNTER — Ambulatory Visit
Admission: RE | Admit: 2019-08-22 | Discharge: 2019-08-22 | Disposition: A | Discharge status: Home / Self Care | Payer: Medicare Other | Source: Ambulatory Visit | Attending: Radiation Oncology | Admitting: Radiation Oncology

## 2019-08-22 ENCOUNTER — Telehealth (HOSPITAL_COMMUNITY): Payer: Self-pay | Admitting: Radiology

## 2019-08-22 DIAGNOSIS — Z170 Estrogen receptor positive status [ER+]: Secondary | ICD-10-CM | POA: Diagnosis not present

## 2019-08-22 DIAGNOSIS — C50411 Malignant neoplasm of upper-outer quadrant of right female breast: Secondary | ICD-10-CM | POA: Diagnosis not present

## 2019-08-22 DIAGNOSIS — Z510 Encounter for antineoplastic radiation therapy: Secondary | ICD-10-CM | POA: Diagnosis not present

## 2019-08-22 NOTE — Telephone Encounter (Signed)
Just an FYI. We have made several attempts to contact this patient including sending a letter to schedule or reschedule their echocardiogram. We will be removing the patient from the echo WQ.  1.22.21@1513  will mail letter evd  1.18.21 Edu/Meeting (Patient has cancer and is starting radiation today, she will call back to reschedule Thank you

## 2019-08-23 ENCOUNTER — Ambulatory Visit
Admission: RE | Admit: 2019-08-23 | Discharge: 2019-08-23 | Disposition: A | Discharge status: Home / Self Care | Payer: Medicare Other | Source: Ambulatory Visit | Attending: Radiation Oncology | Admitting: Radiation Oncology

## 2019-08-23 ENCOUNTER — Other Ambulatory Visit: Payer: Self-pay

## 2019-08-23 DIAGNOSIS — C50411 Malignant neoplasm of upper-outer quadrant of right female breast: Secondary | ICD-10-CM | POA: Diagnosis not present

## 2019-08-23 DIAGNOSIS — Z510 Encounter for antineoplastic radiation therapy: Secondary | ICD-10-CM | POA: Diagnosis not present

## 2019-08-23 DIAGNOSIS — Z170 Estrogen receptor positive status [ER+]: Secondary | ICD-10-CM | POA: Diagnosis not present

## 2019-08-24 ENCOUNTER — Other Ambulatory Visit: Payer: Self-pay

## 2019-08-24 ENCOUNTER — Ambulatory Visit
Admission: RE | Admit: 2019-08-24 | Discharge: 2019-08-24 | Disposition: A | Discharge status: Home / Self Care | Payer: Medicare Other | Source: Ambulatory Visit | Attending: Radiation Oncology | Admitting: Radiation Oncology

## 2019-08-24 DIAGNOSIS — Z510 Encounter for antineoplastic radiation therapy: Secondary | ICD-10-CM | POA: Diagnosis not present

## 2019-08-24 DIAGNOSIS — Z170 Estrogen receptor positive status [ER+]: Secondary | ICD-10-CM | POA: Diagnosis not present

## 2019-08-24 DIAGNOSIS — C50411 Malignant neoplasm of upper-outer quadrant of right female breast: Secondary | ICD-10-CM | POA: Diagnosis not present

## 2019-08-25 ENCOUNTER — Ambulatory Visit
Admission: RE | Admit: 2019-08-25 | Discharge: 2019-08-25 | Disposition: A | Discharge status: Home / Self Care | Payer: Medicare Other | Source: Ambulatory Visit | Attending: Radiation Oncology | Admitting: Radiation Oncology

## 2019-08-25 ENCOUNTER — Other Ambulatory Visit: Payer: Self-pay

## 2019-08-25 DIAGNOSIS — C50411 Malignant neoplasm of upper-outer quadrant of right female breast: Secondary | ICD-10-CM | POA: Diagnosis not present

## 2019-08-25 DIAGNOSIS — Z510 Encounter for antineoplastic radiation therapy: Secondary | ICD-10-CM | POA: Diagnosis not present

## 2019-08-25 DIAGNOSIS — Z170 Estrogen receptor positive status [ER+]: Secondary | ICD-10-CM | POA: Diagnosis not present

## 2019-08-28 ENCOUNTER — Other Ambulatory Visit: Payer: Self-pay

## 2019-08-28 ENCOUNTER — Ambulatory Visit
Admission: RE | Admit: 2019-08-28 | Discharge: 2019-08-28 | Disposition: A | Discharge status: Home / Self Care | Payer: Medicare Other | Source: Ambulatory Visit | Attending: Radiation Oncology | Admitting: Radiation Oncology

## 2019-08-28 DIAGNOSIS — Z170 Estrogen receptor positive status [ER+]: Secondary | ICD-10-CM | POA: Diagnosis not present

## 2019-08-28 DIAGNOSIS — C50411 Malignant neoplasm of upper-outer quadrant of right female breast: Secondary | ICD-10-CM

## 2019-08-28 DIAGNOSIS — Z510 Encounter for antineoplastic radiation therapy: Secondary | ICD-10-CM | POA: Diagnosis not present

## 2019-08-28 MED ORDER — SONAFINE EX EMUL
1.0000 "application " | Freq: Once | CUTANEOUS | Status: AC
  Administered 2019-08-28: 1 via TOPICAL

## 2019-08-28 NOTE — Progress Notes (Signed)
Carla Foster  [phone] [phone]     ID: Carla Foster DOB: [birth year]  MR#: [id number]  KRC#:[id number]  Patient Care Team: Carla Foster as PCP - General (Family Medicine) Carla Foster as PCP - Cardiology (Cardiology) Carla Foster as Consulting Physician (Gastroenterology) Mammography, [facility] (Diagnostic Radiology) Carla Kaufmann, RN as Oncology Nurse Navigator Carla Germany, RN as Oncology Nurse Navigator Carla Foster as Consulting Physician (Oncology) Carla Foster as Consulting Physician (Radiation Oncology) Carla Foster as Consulting Physician (Ophthalmology) Carla Foster as Consulting Physician (Otolaryngology) Carla Foster OTHER Foster:  CHIEF COMPLAINT: Invasive breast cancer, borderline estrogen receptor positive  CURRENT TREATMENT: Anastrozole to start after the completion of radiation   INTERVAL HISTORY: Carla Foster returns today for follow up of her borderline estrogen receptor positive invasive breast cancer, accompanied by her daughter Carla Foster.    After her last visit with me she proceeded with right lumpectomy on 07/10/2019 under Dr. Marlou Foster. Pathology from the procedure ([id number]) showed: invasive ductal carcinoma, grade 2, 1.2 cm; ductal carcinoma in situ; margins not involved.  She was then referred back to Carla Foster for adjuvant radiation therapy. She began treatment on 08/07/2019, and her last treatment is scheduled for 09/01/2019.  Of note, she underwent bone density screening on 04/27/2019. This showed a T-score of -1.1, which is minimally osteopenic.   REVIEW OF SYSTEMS: [name] she is doing very well with the radiation.  She has some itching over the radiation port area.  She is using a "salve" for that.  She will be done of course later this week.  She has been a little bit tired and occasionally takes a nap.  She has not been able to get herself scheduled for a  COVID-19 vaccine yet.  Otherwise a detailed review of systems today was stable   HISTORY OF CURRENT ILLNESS: From the original intake note:  Carla Foster had routine screening mammography on 05/09/2019 showing a possible abnormality in the right breast. She underwent right diagnostic mammography with tomography and right breast ultrasonography at [facility] on 05/23/2019 showing: breast density category C; 0.9 cm irregular mass in the right breast at 10 o'clock; no abnormal lymph nodes seen in the right axilla.  Accordingly on 05/31/2019 she proceeded to biopsy of the right breast area in question. The pathology from this procedure ([id number]) showed: invasive mammary carcinoma, grade 2, e-cadherin positive. Prognostic indicators significant for: estrogen receptor, 70% positive with weak staining intensity and progesterone receptor, 0% negative. Proliferation marker Ki67 at 15%. HER2 equivocal by immunohistochemistry (2+), with FISH pending  The patient's subsequent history is as detailed below.   PAST MEDICAL HISTORY: Past Medical History:  Diagnosis Date  . Acute blood loss anemia   . AKI (acute kidney injury) ([place])   . Asthma   . CKD (chronic kidney disease) stage 3, GFR 30-59 ml/min 01/06/2019  . Complication of anesthesia    nausea  . Diabetes mellitus    type 2  . Family history of upper GI bleeding 09/05/2018   Carla Foster, hospitalized: egd 11/2017  . GERD (gastroesophageal reflux disease)   . GI bleed   . H/O: upper GI bleed 09/05/2018   11/2017, Peptic ulcer by EGD, Carla Foster; hospitalized  . Hematemesis   . Hyperlipidemia   . Hypertension   . Melena   . Osteopenia 04/28/2019   dexa 04/2019 T = - 2.3 L/spine lowest. Recheck 2 years.   Marland Kitchen  PAD (peripheral artery disease) ([place]) - asymptomatic 04/27/2019   [employer] ABI's by in home nurse assessment: Bilateral abnl: betweein 0.6 and 0.7 Can't tolerate aspirin  . PONV (postoperative nausea and vomiting)     PAST SURGICAL  HISTORY: Past Surgical History:  Procedure Laterality Date  . ABDOMINAL HYSTERECTOMY    . BLADDER SURGERY    . BREAST LUMPECTOMY WITH RADIOACTIVE SEED LOCALIZATION Right 07/10/2019   Procedure: RIGHT BREAST LUMPECTOMY WITH RADIOACTIVE SEED LOCALIZATION;  Surgeon: Carla Foster;  Location: [place];  Service: General;  Laterality: Right;  . BREAST SURGERY    . CATARACT EXTRACTION W/ INTRAOCULAR LENS IMPLANT Left 11/13/2015  . CATARACT EXTRACTION W/ INTRAOCULAR LENS IMPLANT Right   . CERVICAL DISCECTOMY  08/23/00   C5-6, C6-7 anterior cervical diskectomy with fibular bone bank fusion followed by Atlantis anterior cervical plating with the operating microscope  --  SURGEON:  Carla Ghee, M.D.  . ESOPHAGOGASTRODUODENOSCOPY (EGD) WITH PROPOFOL N/A 11/21/2017   Procedure: ESOPHAGOGASTRODUODENOSCOPY (EGD) WITH PROPOFOL;  Surgeon: Carla Foster;  Location: [place];  Service: Endoscopy;  Laterality: N/A;  . PARTIAL HYSTERECTOMY    . TEE WITHOUT CARDIOVERSION N/A 08/01/2018   Procedure: TRANSESOPHAGEAL ECHOCARDIOGRAM (TEE);  Surgeon: Carla Foster;  Location[facility] ENDOSCOPY;  Service: Cardiovascular;  Laterality: N/A;    FAMILY HISTORY: Family History  Problem Relation Age of Onset  . Emphysema Father   . Stroke Mother   . Hypertension Mother   . Atrial fibrillation Mother        heart flutter  . Asthma Daughter   . Cancer Daughter    Patient's father was 36 years old when he died from [place]. Patient's mother died from stroke at age 69. The patient denies a family hx of breast, pancreas, prostate or ovarian cancer. She does note anal cancer in her daughter, diagnosed at daughters age 94.  The patient has 1 brother and 1 sister.   GYNECOLOGIC HISTORY:  No LMP recorded. Patient has had a hysterectomy. Menarche: 79 years old Age at first live birth: 79 years old [place] P3 Contraceptive: used for 10 years HRT: never used  Hysterectomy? Yes,  partial BSO? no   SOCIAL HISTORY: (updated 05/2019)  Carla Foster retired from working for [employer]. She is widowed. Her husband, Mateo Flow, passed away in 2006-07-28; he was my patient.  Akiera lives alone, with no pets.  She is very functional, drives, and takes care of all her own needs.  One of her daughters lives right next door and the other one lives right behind her house.  Daughter Montine Circle, age 76, works in [place] as Librarian, academic of [place]. Daughter Sharen Hint, age 17, works in [employer] as a Engineering geologist. Daughter Ottilie Wigglesworth, age 46, lives in [place] and does not currently work. The patient has 7 grandchildren and 9 great-grandchildren. She attends [employer].    ADVANCED DIRECTIVES: The patient has named her daughter Audrea [place] as her HCPOA.  She showed me a copy of the completed and notarized document at the 08/29/2019 visit   HEALTH MAINTENANCE: Social History   Tobacco Use  . Smoking status: Former Smoker    Packs/day: 1.00    Years: 18.00    Pack years: 18.00    Types: Cigarettes    Quit date: 01/13/1974    Years since quitting: 45.6  . Smokeless tobacco: Never Used  Substance Use Topics  . Alcohol use: No  .  Drug use: No     Colonoscopy: 03/2018, normal, no repeat indicated  PAP: none on file, s/p hysterectomy  Bone density: 04/2019, -1.1   Allergies  Allergen Reactions  . Tiazac [Diltiazem Hcl] Other (See Comments)    Ankle edema    Current Outpatient Medications  Medication Sig Dispense Refill  . albuterol (VENTOLIN HFA) 108 (90 Base) MCG/ACT inhaler Inhale 2 puffs into the lungs every 6 (six) hours as needed for wheezing or shortness of breath. 18 g 3  . amLODipine (NORVASC) 5 MG tablet Take 1 tablet (5 mg total) by mouth daily. 90 tablet 3  . anastrozole (ARIMIDEX) 1 MG tablet Take 1 tablet (1 mg total) by mouth daily. 90 tablet 4  . Calcium Carbonate-Vitamin D (CALCIUM 600 + D PO) Take 1 tablet  by mouth daily.      . carvedilol (COREG) 25 MG tablet Take 1 tablet (25 mg total) by mouth 2 (two) times daily. 180 tablet 3  . doxazosin (CARDURA) 4 MG tablet Take 1 tablet (4 mg total) by mouth at bedtime. 90 tablet 3  . fenofibrate 160 MG tablet Take 1 tablet (160 mg total) by mouth daily. 90 tablet 3  . ferrous sulfate 325 (65 FE) MG tablet Take 1 tablet (325 mg total) by mouth daily. 90 tablet 3  . furosemide (LASIX) 40 MG tablet TAKE 1 TABLET BY MOUTH DAILY OFFICE VISIT DUE NOW 30 tablet 0  . HYDROcodone-acetaminophen (NORCO/VICODIN) 5-325 MG tablet Take 1 tablet by mouth every 6 (six) hours as needed for moderate pain or severe pain. (Patient not taking: Reported on 07/25/2019) 10 tablet 0  . losartan (COZAAR) 50 MG tablet Take 1 tablet (50 mg total) by mouth daily. 90 tablet 3  . metFORMIN (GLUCOPHAGE) 1000 MG tablet Take 1 tablet (1,000 mg total) by mouth daily. 90 tablet 3  . pantoprazole (PROTONIX) 40 MG tablet Take 40 mg by mouth daily.    Marland Kitchen Propylene Glycol (SYSTANE BALANCE) 0.6 % SOLN Apply 1 drop to eye as needed (dry eyes).    . simvastatin (ZOCOR) 20 MG tablet Take 1 tablet (20 mg total) by mouth at bedtime. 90 tablet 3  . vitamin A 8000 UNIT capsule Take 8,000 Units by mouth daily.     . vitamin C (ASCORBIC ACID) 500 MG tablet Take 500 mg by mouth daily.       No current facility-administered medications for this visit.    OBJECTIVE: Older white woman in no acute distress  Vitals:   08/29/19 1350  BP: (!) 163/79  Pulse: 84  Resp: 18  Temp: 98.9 F (37.2 C)  SpO2: 92%     Body mass index is 25.53 kg/m.   Wt Readings from Last 3 Encounters:  08/29/19 158 lb 3.2 oz (71.8 kg)  07/25/19 158 lb (71.7 kg)  07/10/19 155 lb 3.3 oz (70.4 kg)      ECOG FS:1 - Symptomatic but completely ambulatory  Sclerae unicteric, EOMs intact Wearing a mask No cervical or supraclavicular adenopathy Lungs no rales or rhonchi Heart regular rate and rhythm Abd soft, nontender, positive  bowel sounds MSK no focal spinal tenderness, no upper extremity lymphedema Neuro: nonfocal, well oriented, appropriate affect Breasts: The right breast is status post lumpectomy and radiation.  The cosmetic result is excellent.  There is minimal erythema.  There is no desquamation.  The left breast is benign.  Both axillae are benign.   LAB RESULTS:  CMP     Component Value Date/Time  NA 137 06/07/2019 1240   NA 142 12/29/2018 1052   K 4.7 06/07/2019 1240   CL 100 06/07/2019 1240   CO2 26 06/07/2019 1240   GLUCOSE 269 (H) 06/07/2019 1240   BUN 39 (H) 06/07/2019 1240   BUN 35 (H) 12/29/2018 1052   CREATININE 1.44 (H) 06/07/2019 1240   CREATININE 1.30 (H) 03/17/2016 0949   CALCIUM 10.1 06/07/2019 1240   PROT 7.1 06/07/2019 1240   ALBUMIN 3.8 06/07/2019 1240   AST 20 06/07/2019 1240   ALT 12 06/07/2019 1240   ALKPHOS 27 (L) 06/07/2019 1240   BILITOT 0.2 (L) 06/07/2019 1240   GFRNONAA 34 (L) 06/07/2019 1240   GFRAA 40 (L) 06/07/2019 1240    No results found for: TOTALPROTELP, ALBUMINELP, A1GS, A2GS, BETS, BETA2SER, GAMS, MSPIKE, SPEI  No results found for: KPAFRELGTCHN, LAMBDASER, KAPLAMBRATIO  Lab Results  Component Value Date   WBC 8.0 06/07/2019   NEUTROABS 5.7 06/07/2019   HGB 10.0 (L) 06/07/2019   HCT 31.2 (L) 06/07/2019   MCV 91.5 06/07/2019   PLT 270 06/07/2019   No results found for: LABCA2  No components found for: MCNOBS962  No results for input(s): INR in the last 168 hours.  No results found for: LABCA2  No results found for: EZM629  No results found for: UTM546  No results found for: TKP546  No results found for: CA2729  No components found for: HGQUANT  No results found for: CEA1 / No results found for: CEA1   No results found for: AFPTUMOR  No results found for: CHROMOGRNA  No results found for: HGBA, HGBA2QUANT, HGBFQUANT, HGBSQUAN (Hemoglobinopathy evaluation)   No results found for: LDH  Lab Results  Component Value Date    IRON 66 01/06/2019   TIBC 381 01/06/2019   IRONPCTSAT 17 01/06/2019   (Iron and TIBC)  Lab Results  Component Value Date   FERRITIN 122 01/06/2019    Urinalysis    Component Value Date/Time   COLORURINE YELLOW 07/26/2018 0633   APPEARANCEUR HAZY (A) 07/26/2018 0633   LABSPEC 1.017 07/26/2018 [place] 5.0 07/26/2018 0633   GLUCOSEU 150 (A) 07/26/2018 0633   HGBUR NEGATIVE 07/26/2018 0633   BILIRUBINUR NEGATIVE 07/26/2018 0633   KETONESUR 5 (A) 07/26/2018 0633   PROTEINUR 100 (A) 07/26/2018 0633   NITRITE NEGATIVE 07/26/2018 0633   LEUKOCYTESUR NEGATIVE 07/26/2018 5681    STUDIES: No results found.   ELIGIBLE FOR AVAILABLE RESEARCH PROTOCOL: no  ASSESSMENT: 79 y.o. [place], [place] woman status post right breast upper outer quadrant biopsy 05/31/2019 for a clinical T1b N0, stage IA invasive ductal carcinoma, grade 2, weakly estrogen receptor positive, progesterone receptor and HER-2 negative, with an MIB-1 of 15%  (1) status post right lumpectomy 07/10/2019 for a pT1c pNX, stage IA invasive ductal carcinoma, grade 2, with negative margins  (2) adjuvant radiation to be completed 09/01/2019  (3) to start anastrozole 10/02/2019  (a) bone density 04/27/2019 shows a T score of -1.1   PLAN: Arieana has completed her surgery and soon will complete her radiation, which are local forms of treatment for her breast cancer.  Given the size of the tumor and its phenotype as well as the fact that the tumor was not strongly estrogen receptor positive, she is not likely to derive significant benefit from antiestrogens, but certainly some benefit is possible.  We discussed the possible toxicities side effects and complications of anastrozole today she would like to give it a try.  I have placed the  prescription in for her to start mid March.  If she tolerates it well, meaning essentially with no side effects, the plan will be to continue that for 5 years.  If any side effects  significantly affect her quality of life then we will stop and simply observe.  In either case she is going to have her next mammogram in October and see me shortly after that, with yearly follow-up thereafter  She knows to call for any other issue that may develop before the next visit  Total encounter time 35 minutes.Carla Foster   08/29/2019 3:37 PM Medical Oncology and Hematology [facility] [place], [place] [address] Tel. [phone]    Fax. [phone]   This document serves as a record of services personally performed by Lurline Del, Foster. It was created on his behalf by Wilburn Mylar, a trained medical scribe. The creation of this record is based on the scribe's personal observations and the provider's statements to them.   I, Lurline Del Foster, have reviewed the above documentation for accuracy and completeness, and I agree with the above.   *Total Encounter Time as defined by the Centers for Medicare and Medicaid Services includes, in addition to the face-to-face time of a patient visit (documented in the note above) non-face-to-face time: obtaining and reviewing outside history, ordering and reviewing medications, tests or procedures, care coordination (communications with other health care professionals or caregivers) and documentation in the medical record.

## 2019-08-29 ENCOUNTER — Inpatient Hospital Stay: Payer: Medicare Other | Attending: Oncology | Admitting: Oncology

## 2019-08-29 ENCOUNTER — Ambulatory Visit
Admission: RE | Admit: 2019-08-29 | Discharge: 2019-08-29 | Disposition: A | Discharge status: Home / Self Care | Payer: Medicare Other | Source: Ambulatory Visit | Attending: Radiation Oncology | Admitting: Radiation Oncology

## 2019-08-29 ENCOUNTER — Other Ambulatory Visit: Payer: Self-pay

## 2019-08-29 VITALS — BP 163/79 | HR 84 | Temp 98.9°F | Resp 18 | Ht 66.0 in | Wt 158.2 lb

## 2019-08-29 DIAGNOSIS — Z87891 Personal history of nicotine dependence: Secondary | ICD-10-CM | POA: Insufficient documentation

## 2019-08-29 DIAGNOSIS — Z90710 Acquired absence of both cervix and uterus: Secondary | ICD-10-CM | POA: Insufficient documentation

## 2019-08-29 DIAGNOSIS — Z9221 Personal history of antineoplastic chemotherapy: Secondary | ICD-10-CM | POA: Diagnosis not present

## 2019-08-29 DIAGNOSIS — N183 Chronic kidney disease, stage 3 unspecified: Secondary | ICD-10-CM | POA: Insufficient documentation

## 2019-08-29 DIAGNOSIS — E119 Type 2 diabetes mellitus without complications: Secondary | ICD-10-CM | POA: Insufficient documentation

## 2019-08-29 DIAGNOSIS — Z8249 Family history of ischemic heart disease and other diseases of the circulatory system: Secondary | ICD-10-CM | POA: Diagnosis not present

## 2019-08-29 DIAGNOSIS — E785 Hyperlipidemia, unspecified: Secondary | ICD-10-CM | POA: Diagnosis not present

## 2019-08-29 DIAGNOSIS — J45909 Unspecified asthma, uncomplicated: Secondary | ICD-10-CM | POA: Insufficient documentation

## 2019-08-29 DIAGNOSIS — Z170 Estrogen receptor positive status [ER+]: Secondary | ICD-10-CM

## 2019-08-29 DIAGNOSIS — C50411 Malignant neoplasm of upper-outer quadrant of right female breast: Secondary | ICD-10-CM

## 2019-08-29 DIAGNOSIS — Z79899 Other long term (current) drug therapy: Secondary | ICD-10-CM | POA: Insufficient documentation

## 2019-08-29 DIAGNOSIS — I10 Essential (primary) hypertension: Secondary | ICD-10-CM | POA: Diagnosis not present

## 2019-08-29 DIAGNOSIS — Z809 Family history of malignant neoplasm, unspecified: Secondary | ICD-10-CM | POA: Insufficient documentation

## 2019-08-29 DIAGNOSIS — M8580 Other specified disorders of bone density and structure, unspecified site: Secondary | ICD-10-CM | POA: Diagnosis not present

## 2019-08-29 DIAGNOSIS — Z510 Encounter for antineoplastic radiation therapy: Secondary | ICD-10-CM | POA: Diagnosis not present

## 2019-08-29 DIAGNOSIS — Z7984 Long term (current) use of oral hypoglycemic drugs: Secondary | ICD-10-CM | POA: Diagnosis not present

## 2019-08-29 MED ORDER — ANASTROZOLE 1 MG PO TABS: 1.0000 mg | ORAL_TABLET | Freq: Every day | ORAL | 4 refills | Status: DC

## 2019-08-30 ENCOUNTER — Telehealth: Payer: Self-pay | Admitting: Oncology

## 2019-08-30 ENCOUNTER — Other Ambulatory Visit: Payer: Self-pay

## 2019-08-30 ENCOUNTER — Ambulatory Visit
Admission: RE | Admit: 2019-08-30 | Discharge: 2019-08-30 | Disposition: A | Discharge status: Home / Self Care | Payer: Medicare Other | Source: Ambulatory Visit | Attending: Radiation Oncology | Admitting: Radiation Oncology

## 2019-08-30 DIAGNOSIS — Z510 Encounter for antineoplastic radiation therapy: Secondary | ICD-10-CM | POA: Diagnosis not present

## 2019-08-30 DIAGNOSIS — C50411 Malignant neoplasm of upper-outer quadrant of right female breast: Secondary | ICD-10-CM | POA: Diagnosis not present

## 2019-08-30 DIAGNOSIS — Z170 Estrogen receptor positive status [ER+]: Secondary | ICD-10-CM | POA: Diagnosis not present

## 2019-08-30 NOTE — Telephone Encounter (Signed)
Faxed referral to [facility] at [phone], received faxed confirmation

## 2019-08-30 NOTE — Telephone Encounter (Signed)
I could not reach patient regarding schedule will mail 

## 2019-08-31 ENCOUNTER — Other Ambulatory Visit: Payer: Self-pay | Admitting: Family Medicine

## 2019-08-31 ENCOUNTER — Other Ambulatory Visit: Payer: Self-pay

## 2019-08-31 ENCOUNTER — Ambulatory Visit
Admission: RE | Admit: 2019-08-31 | Discharge: 2019-08-31 | Disposition: A | Discharge status: Home / Self Care | Payer: Medicare Other | Source: Ambulatory Visit | Attending: Radiation Oncology | Admitting: Radiation Oncology

## 2019-08-31 DIAGNOSIS — C50411 Malignant neoplasm of upper-outer quadrant of right female breast: Secondary | ICD-10-CM | POA: Diagnosis not present

## 2019-08-31 DIAGNOSIS — Z170 Estrogen receptor positive status [ER+]: Secondary | ICD-10-CM | POA: Diagnosis not present

## 2019-08-31 DIAGNOSIS — Z510 Encounter for antineoplastic radiation therapy: Secondary | ICD-10-CM | POA: Diagnosis not present

## 2019-08-31 NOTE — Telephone Encounter (Signed)
Pt is overdue for follow up OV for DM and HTN I have refilled her lasix fo r90 days. Please call and get her scheduled. Thanks.

## 2019-08-31 NOTE — Telephone Encounter (Signed)
Patient has an appointment already scheduled for 10/09/19, is this okay or does she need to follow up sooner.

## 2019-09-01 ENCOUNTER — Encounter: Payer: Self-pay | Admitting: *Deleted

## 2019-09-01 ENCOUNTER — Ambulatory Visit
Admission: RE | Admit: 2019-09-01 | Discharge: 2019-09-01 | Disposition: A | Discharge status: Home / Self Care | Payer: Medicare Other | Source: Ambulatory Visit | Attending: Radiation Oncology | Admitting: Radiation Oncology

## 2019-09-01 ENCOUNTER — Encounter: Payer: Self-pay | Admitting: Radiation Oncology

## 2019-09-01 DIAGNOSIS — Z510 Encounter for antineoplastic radiation therapy: Secondary | ICD-10-CM | POA: Diagnosis not present

## 2019-09-01 DIAGNOSIS — Z170 Estrogen receptor positive status [ER+]: Secondary | ICD-10-CM | POA: Diagnosis not present

## 2019-09-01 DIAGNOSIS — C50411 Malignant neoplasm of upper-outer quadrant of right female breast: Secondary | ICD-10-CM | POA: Diagnosis not present

## 2019-09-12 ENCOUNTER — Ambulatory Visit: Payer: Medicare Other | Admitting: Cardiovascular Disease

## 2019-09-12 ENCOUNTER — Other Ambulatory Visit: Payer: Self-pay

## 2019-09-12 ENCOUNTER — Encounter (INDEPENDENT_AMBULATORY_CARE_PROVIDER_SITE_OTHER): Payer: Self-pay

## 2019-09-12 ENCOUNTER — Encounter: Payer: Self-pay | Admitting: Cardiovascular Disease

## 2019-09-12 VITALS — BP 146/68 | HR 74 | Ht 66.0 in | Wt 155.8 lb

## 2019-09-12 DIAGNOSIS — I5033 Acute on chronic diastolic (congestive) heart failure: Secondary | ICD-10-CM | POA: Diagnosis not present

## 2019-09-12 DIAGNOSIS — I5032 Chronic diastolic (congestive) heart failure: Secondary | ICD-10-CM

## 2019-09-12 DIAGNOSIS — I110 Hypertensive heart disease with heart failure: Secondary | ICD-10-CM

## 2019-09-12 DIAGNOSIS — I10 Essential (primary) hypertension: Secondary | ICD-10-CM | POA: Diagnosis not present

## 2019-09-12 DIAGNOSIS — I272 Pulmonary hypertension, unspecified: Secondary | ICD-10-CM

## 2019-09-12 MED ORDER — LOSARTAN POTASSIUM 100 MG PO TABS: 100.0000 mg | ORAL_TABLET | Freq: Every day | ORAL | 3 refills | Status: DC

## 2019-09-12 NOTE — Progress Notes (Signed)
Date:  09/12/2019   ID:  FION GRAUMAN, DOB 11-Mar-1941, MRN [id number]  Patient Location: Home Provider Location: Office  PCP:  Leamon Arnt, MD  Cardiologist:   Deari Sessler  Electrophysiologist:  None   Evaluation Performed:  Follow-Up Visit  Problems: 1. Hypertension 2. Hyperlipidemia 3. Diabetes mellitus   [name] is a 79 year old female with a history of hypertension and hyperlipidemia. She also has a history of type 2 diabetes mellitus. She's been having problems with elevated blood pressure recently. I saw her in July for hypertension.  She feels fine. She's not had any episodes of chest pain or shortness breath. She continues to lose weight.  She has been told  to watch her salt intake. She used to use a lot of fried bologna.  January 17, 2013:  She is doing well.  No CP , no dyspnea.   Still eating salt.   March 09, 2014:  Ms. Roys is doing well.  Walking regularly.  Hard of hearing - bought an expensive heaing aid that does not work well.   No CP or dyspnea.    Aug. 29, 2016:  Seen back today for HTN BP is a bit up today . Had a stomach virus yesterday - better today  Has not been eating any extra salt . No CP or dyspnea  Aug. 29, 2017:  Doing well.   Had some cataract surgery recently .  Stays active.   Mows with a push mower.  Tries to watch her salt   Dec.  6, 2018:     Seen for follow up of her HTN Has had lots of dental work  Does not avoid salty foods  - loves country ham - eats once a week,  Eats salty foods regularly , does not cook for herself  January 11, 2018:   Ms. Woolums is seen today for follow-up of her hypertension and hyperlipidemia.  Still eats some sausage and ham .    Taking and tolerating all her meds.  Very active.   Was in the hospital with bleeding ulcer .    Received 2 units PRBC. .  Was taking ASA and advil for back pain .  Has a cough - is on Lisinopril  Will change to Losartan    Chief Complaint:  Follow up CHF    History of Present Illness:    BEVERLYE LOUGHEED is a 79 y.o. female with chronic combined CHF. She was admitted in Jan. 2020 with community acquired pneumonia complicated by acute on chronic diastolic CHF .  Echo showed EF of 60-65%.  Grade 2 diastolic dysfunciton, mild AI,  Severe pulmonary HTN   Is having right leg swelling .  Worse in the eveing.  Better in the am  No swelling in left leg    The patient does not have symptoms concerning for COVID-19 infection (fever, chills, cough, or new shortness of breath).   September 12, 2019: Makani  is seen today for follow-up of her chronic diastolic congestive heart failure.  She has mild aortic insufficiency.  She has severe pulmonary hypertension. Just had right breast surgery and radiation therapy Is recovering well  Surgery seems to be successful  Tries to stay away from salty foods.  Leg swelling is better  BP is a bit elevated .   Will increase Losartan to 100 mg a day     Past Medical History:  Diagnosis Date  . Acute blood loss anemia   . AKI (acute kidney  injury) ([place])   . Asthma   . CKD (chronic kidney disease) stage 3, GFR 30-59 ml/min 01/06/2019  . Complication of anesthesia    nausea  . Diabetes mellitus    type 2  . Family history of upper GI bleeding 09/05/2018   Dr. Carlean Purl, hospitalized: egd 11/2017  . GERD (gastroesophageal reflux disease)   . GI bleed   . H/O: upper GI bleed 09/05/2018   11/2017, Peptic ulcer by EGD, Dr. Carlean Purl; hospitalized  . Hematemesis   . Hyperlipidemia   . Hypertension   . Melena   . Osteopenia 04/28/2019   dexa 04/2019 T = - 2.3 L/spine lowest. Recheck 2 years.   Marland Kitchen PAD (peripheral artery disease) ([place]) - asymptomatic 04/27/2019   [facility] ABI's by in home nurse assessment: Bilateral abnl: betweein 0.6 and 0.7 Can't tolerate aspirin  . PONV (postoperative nausea and vomiting)    Past Surgical History:  Procedure Laterality Date  . ABDOMINAL HYSTERECTOMY    . BLADDER SURGERY    .  BREAST LUMPECTOMY WITH RADIOACTIVE SEED LOCALIZATION Right 07/10/2019   Procedure: RIGHT BREAST LUMPECTOMY WITH RADIOACTIVE SEED LOCALIZATION;  Surgeon: Jovita Kussmaul, MD;  Location: [facility];  Service: General;  Laterality: Right;  . BREAST SURGERY    . CATARACT EXTRACTION W/ INTRAOCULAR LENS IMPLANT Left 11/13/2015  . CATARACT EXTRACTION W/ INTRAOCULAR LENS IMPLANT Right   . CERVICAL DISCECTOMY  08/23/00   C5-6, C6-7 anterior cervical diskectomy with fibular bone bank fusion followed by Atlantis anterior cervical plating with the operating microscope  --  SURGEON:  Faythe Ghee, M.D.  . ESOPHAGOGASTRODUODENOSCOPY (EGD) WITH PROPOFOL N/A 11/21/2017   Procedure: ESOPHAGOGASTRODUODENOSCOPY (EGD) WITH PROPOFOL;  Surgeon: Gatha Mayer, MD;  Location: [facility];  Service: Endoscopy;  Laterality: N/A;  . PARTIAL HYSTERECTOMY    . TEE WITHOUT CARDIOVERSION N/A 08/01/2018   Procedure: TRANSESOPHAGEAL ECHOCARDIOGRAM (TEE);  Surgeon: Skeet Latch, MD;  Location: [facility];  Service: Cardiovascular;  Laterality: N/A;     Current Meds  Medication Sig  . albuterol (VENTOLIN HFA) 108 (90 Base) MCG/ACT inhaler Inhale 2 puffs into the lungs every 6 (six) hours as needed for wheezing or shortness of breath.  Marland Kitchen amLODipine (NORVASC) 5 MG tablet Take 1 tablet (5 mg total) by mouth daily.  Marland Kitchen anastrozole (ARIMIDEX) 1 MG tablet Take 1 tablet (1 mg total) by mouth daily.  . Calcium Carbonate-Vitamin D (CALCIUM 600 + D PO) Take 1 tablet by mouth daily.    . carvedilol (COREG) 25 MG tablet Take 1 tablet (25 mg total) by mouth 2 (two) times daily.  Marland Kitchen doxazosin (CARDURA) 4 MG tablet Take 1 tablet (4 mg total) by mouth at bedtime.  . fenofibrate 160 MG tablet Take 1 tablet (160 mg total) by mouth daily.  . ferrous sulfate 325 (65 FE) MG tablet Take 1 tablet (325 mg total) by mouth daily.  . furosemide (LASIX) 40 MG tablet TAKE 1 TABLET BY MOUTH DAILY OFFICE VISIT DUE NOW  . metFORMIN  (GLUCOPHAGE) 1000 MG tablet Take 1 tablet (1,000 mg total) by mouth daily.  . pantoprazole (PROTONIX) 40 MG tablet Take 40 mg by mouth daily.  Marland Kitchen Propylene Glycol (SYSTANE BALANCE) 0.6 % SOLN Apply 1 drop to eye as needed (dry eyes).  . simvastatin (ZOCOR) 20 MG tablet Take 1 tablet (20 mg total) by mouth at bedtime.  . vitamin A 8000 UNIT capsule Take 8,000 Units by mouth daily.   . vitamin C (ASCORBIC ACID) 500 MG  tablet Take 500 mg by mouth daily.    . [DISCONTINUED] losartan (COZAAR) 50 MG tablet Take 1 tablet (50 mg total) by mouth daily.     Allergies:   Tiazac [diltiazem hcl]   Social History   Tobacco Use  . Smoking status: Former Smoker    Packs/day: 1.00    Years: 18.00    Pack years: 18.00    Types: Cigarettes    Quit date: 01/13/1974    Years since quitting: 45.6  . Smokeless tobacco: Never Used  Substance Use Topics  . Alcohol use: No  . Drug use: No     Family Hx: The patient's family history includes Asthma in her daughter; Atrial fibrillation in her mother; Cancer in her daughter; Emphysema in her father; Hypertension in her mother; Stroke in her mother.  ROS:   Please see the history of present illness.     All other systems reviewed and are negative.   Prior CV studies:   The following studies were reviewed today:    Labs/Other Tests and Data Reviewed:    EKG:   Recent Labs: 04/10/2019: TSH 2.35 06/07/2019: ALT 12; BUN 39; Creatinine 1.44; Hemoglobin 10.0; Platelet Count 270; Potassium 4.7; Sodium 137   Recent Lipid Panel Lab Results  Component Value Date/Time   CHOL 139 04/10/2019 11:34 AM   CHOL 131 01/11/2018 10:11 AM   TRIG 77.0 04/10/2019 11:34 AM   HDL 61.10 04/10/2019 11:34 AM   HDL 58 01/11/2018 10:11 AM   CHOLHDL 2 04/10/2019 11:34 AM   LDLCALC 62 04/10/2019 11:34 AM   LDLCALC 57 01/11/2018 10:11 AM    Wt Readings from Last 3 Encounters:  09/12/19 155 lb 12.8 oz (70.7 kg)  08/29/19 158 lb 3.2 oz (71.8 kg)  07/25/19 158 lb (71.7  kg)     Objective:     Physical Exam: Blood pressure (!) 146/68, pulse 74, height 5\' 6"  (1.676 m), weight 155 lb 12.8 oz (70.7 kg), SpO2 95 %.  GEN:   Elderly female,  NAD  HEENT: Normal NECK: No JVD; No carotid bruits LYMPHATICS: No lymphadenopathy CARDIAC: RRR ,  Right breast incision is well healed.   Has slightl redness from radiation around the incision  RESPIRATORY:  Clear to auscultation without rales, wheezing or rhonchi  ABDOMEN: Soft, non-tender, non-distended MUSCULOSKELETAL:  No edema; No deformity  SKIN: Warm and dry NEUROLOGIC:  Alert and oriented x 3    ASSESSMENT & PLAN:    Leg edema - R >> L.,   - edema has resolved.   1. Chronic diastolic CHF -  . Controlled.  She is avoiding salt for the most part.  2. HTN:   Blood pressure is mildly elevated today.  She has been avoiding salt for the most part.  Will increase Losartan to 100 mg a day ,  Will get BMP in 3 weeks   COVID-19 Education: The signs and symptoms of COVID-19 were discussed with the patient and how to seek care for testing (follow up with PCP or arrange E-visit).  The importance of social distancing was discussed today.  Time:      Medication Adjustments/Labs and Tests Ordered: Current medicines are reviewed at length with the patient today.  Concerns regarding medicines are outlined above.   Tests Ordered: Orders Placed This Encounter  Procedures  . Basic Metabolic Panel (BMET)    Medication Changes: Meds ordered this encounter  Medications  . losartan (COZAAR) 100 MG tablet    Sig: Take 1  tablet (100 mg total) by mouth daily.    Dispense:  90 tablet    Refill:  3    Disposition:  Follow up in 6 month(s)  Signed, Mertie Moores, MD  09/12/2019 4:30 PM    [facility]

## 2019-09-12 NOTE — Patient Instructions (Addendum)
Medication Instructions:  Your physician has recommended you make the following change in your medication:  INCREASE Losartan to 100 mg daily for your high blood pressure  *If you need a refill on your cardiac medications before your next appointment, please call your pharmacy*  Lab Work: Your physician recommends that you return for lab work in: 3 weeks on Tuesday March 16 at 10:30 am You do not have to fast for this appointment  If you have labs (blood work) drawn today and your tests are completely normal, you will receive your results only by: Marland Kitchen MyChart Message (if you have MyChart) OR . A paper copy in the mail If you have any lab test that is abnormal or we need to change your treatment, we will call you to review the results.  Testing/Procedures: None Ordered   Follow-Up: At [facility], you and your health needs are our priority.  As part of our continuing mission to provide you with exceptional heart care, we have created designated Provider Care Teams.  These Care Teams include your primary Cardiologist (physician) and Advanced Practice Providers (APPs -  Physician Assistants and Nurse Practitioners) who all work together to provide you with the care you need, when you need it.  Your next appointment:   1 year(s)  The format for your next appointment:   In Person  Provider:   You may see Mertie Moores, MD or one of the following Advanced Practice Providers on your designated Care Team:    Richardson Dopp, PA-C  [place], [place]  Daune Perch, [place]

## 2019-09-27 ENCOUNTER — Encounter: Payer: Self-pay | Admitting: Radiation Oncology

## 2019-09-27 NOTE — Progress Notes (Signed)
I called the patient today about her upcoming follow-up appointment in radiation oncology.   Given the state of the COVID-19 pandemic, concerning case numbers in our community, and guidance from [facility], I offered a phone assessment with the patient to determine if coming to the clinic was necessary. She accepted.  I let the patient know that I had spoken with Dr. Isidore Moos, and she wanted them to know the importance of washing their hands for at least 20 seconds at a time, especially after going out in public, and before they eat.  Limit going out in public whenever possible. Do not touch your face, unless your hands are clean, such as when bathing. Get plenty of rest, eat well, and stay hydrated.   The patient denies any symptomatic concerns.  Specifically, they report good healing of their skin in the radiation fields.  Skin is intact.    I recommended that she continue skin care by applying oil or lotion with vitamin E to the skin in the radiation fields, BID, for 2 more months.  Continue follow-up with medical oncology - follow-up is scheduled on 04/30/20 with Dr. Jana Hakim.  I explained that yearly mammograms are important for patients with intact breast tissue, and physical exams are important after mastectomy for patients that cannot undergo mammography.  I encouraged her to call if she had further questions or concerns about her healing. Otherwise, she will follow-up PRN in radiation oncology. Patient is pleased with this plan, and we will cancel her upcoming follow-up to reduce the risk of COVID-19 transmission.

## 2019-09-27 NOTE — Progress Notes (Signed)
  Patient Name: Carla Foster MRN: [id number] DOB: 06/12/1941 Referring Physician: Lurline Del (Profile Not Attached) Date of Service: 09/01/2019 [facility], [place]                                                        End Of Treatment Note  Diagnoses: C50.411-Malignant neoplasm of upper-outer quadrant of right female breast  Cancer Staging: Cancer Staging Malignant neoplasm of upper-outer quadrant of right breast in female, estrogen receptor positive ([place]) Staging form: Breast, AJCC 8th Edition - Clinical stage from 06/07/2019: Stage IA (cT1b, cN0, cM0, G2, ER+, PR-, HER2-) - Unsigned - Pathologic stage from 07/25/2019: Stage Unknown (pT1c, pNX, cM0, G2, ER+, PR-, HER2-) - Signed by Eppie Gibson, MD on 07/25/2019   Intent: Curative  Radiation Treatment Dates: 08/07/2019 through 09/01/2019 Site Technique Total Dose (Gy) Dose per Fx (Gy) Completed Fx Beam Energies  Breast, Right: Breast_Rt_Ax 3D 40.05/40.05 2.67 15/15 6X  Breast, Right: Breast_Rt_Bst specialPort 10/10 2 5/5 12E, 15E   Narrative: The patient tolerated radiation therapy relatively well.   Plan: The patient will follow-up with radiation oncology in 1 month, or as needed.  -----------------------------------  Eppie Gibson, MD

## 2019-09-29 ENCOUNTER — Ambulatory Visit
Admission: RE | Admit: 2019-09-29 | Discharge: 2019-09-29 | Disposition: A | Discharge status: Home / Self Care | Payer: Medicare Other | Source: Ambulatory Visit | Attending: Radiation Oncology | Admitting: Radiation Oncology

## 2019-09-29 HISTORY — DX: Personal history of irradiation: Z92.3

## 2019-10-03 ENCOUNTER — Other Ambulatory Visit: Payer: Self-pay

## 2019-10-03 ENCOUNTER — Other Ambulatory Visit: Payer: Medicare Other

## 2019-10-03 DIAGNOSIS — I5032 Chronic diastolic (congestive) heart failure: Secondary | ICD-10-CM

## 2019-10-03 DIAGNOSIS — I10 Essential (primary) hypertension: Secondary | ICD-10-CM | POA: Diagnosis not present

## 2019-10-03 LAB — BASIC METABOLIC PANEL
BUN/Creatinine Ratio: 26 (ref 12–28)
BUN: 33 mg/dL — ABNORMAL HIGH (ref 8–27)
CO2: 24 mmol/L (ref 20–29)
Calcium: 10.1 mg/dL (ref 8.7–10.3)
Chloride: 96 mmol/L (ref 96–106)
Creatinine, Ser: 1.29 mg/dL — ABNORMAL HIGH (ref 0.57–1.00)
GFR calc Af Amer: 45 mL/min/{1.73_m2} — ABNORMAL LOW (ref >59)
GFR calc non Af Amer: 39 mL/min/{1.73_m2} — ABNORMAL LOW (ref >59)
Glucose: 143 mg/dL — ABNORMAL HIGH (ref 65–99)
Potassium: 4.7 mmol/L (ref 3.5–5.2)
Sodium: 135 mmol/L (ref 134–144)

## 2019-10-09 ENCOUNTER — Ambulatory Visit: Payer: Medicare Other | Admitting: Family Medicine

## 2019-10-09 ENCOUNTER — Ambulatory Visit: Payer: Medicare Other

## 2019-10-25 ENCOUNTER — Ambulatory Visit: Payer: Medicare Other | Admitting: Family Medicine

## 2019-10-27 ENCOUNTER — Encounter: Payer: Self-pay | Admitting: Family Medicine

## 2019-10-27 ENCOUNTER — Ambulatory Visit (INDEPENDENT_AMBULATORY_CARE_PROVIDER_SITE_OTHER): Payer: Medicare Other

## 2019-10-27 ENCOUNTER — Ambulatory Visit (INDEPENDENT_AMBULATORY_CARE_PROVIDER_SITE_OTHER): Payer: Medicare Other | Admitting: Family Medicine

## 2019-10-27 ENCOUNTER — Other Ambulatory Visit: Payer: Self-pay

## 2019-10-27 VITALS — BP 170/74 | HR 78 | Temp 98.0°F | Resp 16 | Ht 66.0 in | Wt 153.2 lb

## 2019-10-27 VITALS — BP 140/68 | Temp 98.0°F | Ht 66.0 in | Wt 153.2 lb

## 2019-10-27 DIAGNOSIS — G8929 Other chronic pain: Secondary | ICD-10-CM | POA: Diagnosis not present

## 2019-10-27 DIAGNOSIS — Z0000 Encounter for general adult medical examination without abnormal findings: Secondary | ICD-10-CM

## 2019-10-27 DIAGNOSIS — N1831 Chronic kidney disease, stage 3a: Secondary | ICD-10-CM

## 2019-10-27 DIAGNOSIS — D638 Anemia in other chronic diseases classified elsewhere: Secondary | ICD-10-CM

## 2019-10-27 DIAGNOSIS — I110 Hypertensive heart disease with heart failure: Secondary | ICD-10-CM | POA: Diagnosis not present

## 2019-10-27 DIAGNOSIS — I5032 Chronic diastolic (congestive) heart failure: Secondary | ICD-10-CM | POA: Diagnosis not present

## 2019-10-27 DIAGNOSIS — M25561 Pain in right knee: Secondary | ICD-10-CM

## 2019-10-27 DIAGNOSIS — E1121 Type 2 diabetes mellitus with diabetic nephropathy: Secondary | ICD-10-CM

## 2019-10-27 DIAGNOSIS — I272 Pulmonary hypertension, unspecified: Secondary | ICD-10-CM

## 2019-10-27 DIAGNOSIS — I10 Essential (primary) hypertension: Secondary | ICD-10-CM

## 2019-10-27 LAB — POCT GLYCOSYLATED HEMOGLOBIN (HGB A1C): Hemoglobin A1C: 8.1 % — AB (ref 4.0–5.6)

## 2019-10-27 MED ORDER — METFORMIN HCL 1000 MG PO TABS: 1000.0000 mg | ORAL_TABLET | Freq: Two times a day (BID) | ORAL | 3 refills | Status: DC

## 2019-10-27 MED ORDER — DICLOFENAC SODIUM 1 % EX GEL: 4.0000 g | Freq: Four times a day (QID) | CUTANEOUS | 5 refills | Status: DC | PRN

## 2019-10-27 MED ORDER — AMLODIPINE BESYLATE 10 MG PO TABS: 10.0000 mg | ORAL_TABLET | Freq: Every day | ORAL | 3 refills | Status: DC

## 2019-10-27 MED ORDER — FUROSEMIDE 40 MG PO TABS: ORAL_TABLET | ORAL | 0 refills | Status: DC

## 2019-10-27 NOTE — Patient Instructions (Signed)
Carla Foster , Thank you for taking time to come for your Medicare Wellness Visit. I appreciate your ongoing commitment to your health goals. Please review the following plan we discussed and let me know if I can assist you in the future.   Screening recommendations/referrals: Colorectal Screening: up to date; last 03/25/18 Mammogram: up to date; last 05/23/19  Bone Density: up to date; last 04/27/19  Vision and Dental Exams: Recommended annual ophthalmology exams for early detection of glaucoma and other disorders of the eye Recommended annual dental exams for proper oral hygiene  Vaccinations: Influenza vaccine: completed 04/10/19 Pneumococcal vaccine: up to date; last 04/10/19 Tdap vaccine: recommended every 10 years; Please call your insurance company to determine your out of pocket expense. You also receive this vaccine at your local pharmacy or Health Dept. Shingles vaccine: You may receive this vaccine at your local pharmacy. (see handout)   Advanced directives: We have received a copy of your POA (Power of [place]) and/or Living Will. These documents can be located in your chart.  Goals: Recommend to drink at least 6-8 8oz glasses of water per day and consume a balanced diet rich in fresh fruits and vegetables.   Next appointment: Please schedule your Annual Wellness Visit with your Nurse Health Advisor in one year.  Preventive Care 58 Years and Older, Female Preventive care refers to lifestyle choices and visits with your health care provider that can promote health and wellness. What does preventive care include?  A yearly physical exam. This is also called an annual well check.  Dental exams once or twice a year.  Routine eye exams. Ask your health care provider how often you should have your eyes checked.  Personal lifestyle choices, including:  Daily care of your teeth and gums.  Regular physical activity.  Eating a healthy diet.  Avoiding tobacco and drug  use.  Limiting alcohol use.  Practicing safe sex.  Taking low-dose aspirin every day if recommended by your health care provider.  Taking vitamin and mineral supplements as recommended by your health care provider. What happens during an annual well check? The services and screenings done by your health care provider during your annual well check will depend on your age, overall health, lifestyle risk factors, and family history of disease. Counseling  Your health care provider may ask you questions about your:  Alcohol use.  Tobacco use.  Drug use.  Emotional well-being.  Home and relationship well-being.  Sexual activity.  Eating habits.  History of falls.  Memory and ability to understand (cognition).  Work and work Statistician.  Reproductive health. Screening  You may have the following tests or measurements:  Height, weight, and BMI.  Blood pressure.  Lipid and cholesterol levels. These may be checked every 5 years, or more frequently if you are over 25 years old.  Skin check.  Lung cancer screening. You may have this screening every year starting at age 66 if you have a 30-pack-year history of smoking and currently smoke or have quit within the past 15 years.  Fecal occult blood test (FOBT) of the stool. You may have this test every year starting at age 52.  Flexible sigmoidoscopy or colonoscopy. You may have a sigmoidoscopy every 5 years or a colonoscopy every 10 years starting at age 87.  Hepatitis C blood test.  Hepatitis B blood test.  Sexually transmitted disease (STD) testing.  Diabetes screening. This is done by checking your blood sugar (glucose) after you have not eaten for a  while (fasting). You may have this done every 1-3 years.  Bone density scan. This is done to screen for osteoporosis. You may have this done starting at age 32.  Mammogram. This may be done every 1-2 years. Talk to your health care provider about how often you should  have regular mammograms. Talk with your health care provider about your test results, treatment options, and if necessary, the need for more tests. Vaccines  Your health care provider may recommend certain vaccines, such as:  Influenza vaccine. This is recommended every year.  Tetanus, diphtheria, and acellular pertussis (Tdap, Td) vaccine. You may need a Td booster every 10 years.  Zoster vaccine. You may need this after age 54.  Pneumococcal 13-valent conjugate (PCV13) vaccine. One dose is recommended after age 71.  Pneumococcal polysaccharide (PPSV23) vaccine. One dose is recommended after age 48. Talk to your health care provider about which screenings and vaccines you need and how often you need them. This information is not intended to replace advice given to you by your health care provider. Make sure you discuss any questions you have with your health care provider. Document Released: 08/02/2015 Document Revised: 03/25/2016 Document Reviewed: 05/07/2015 Elsevier Interactive Patient Education  2017 [place] Prevention in the Home Falls can cause injuries. They can happen to people of all ages. There are many things you can do to make your home safe and to help prevent falls. What can I do on the outside of my home?  Regularly fix the edges of walkways and driveways and fix any cracks.  Remove anything that might make you trip as you walk through a door, such as a raised step or threshold.  Trim any bushes or trees on the path to your home.  Use bright outdoor lighting.  Clear any walking paths of anything that might make someone trip, such as rocks or tools.  Regularly check to see if handrails are loose or broken. Make sure that both sides of any steps have handrails.  Any raised decks and porches should have guardrails on the edges.  Have any leaves, snow, or ice cleared regularly.  Use sand or salt on walking paths during winter.  Clean up any spills in  your garage right away. This includes oil or grease spills. What can I do in the bathroom?  Use night lights.  Install grab bars by the toilet and in the tub and shower. Do not use towel bars as grab bars.  Use non-skid mats or decals in the tub or shower.  If you need to sit down in the shower, use a plastic, non-slip stool.  Keep the floor dry. Clean up any water that spills on the floor as soon as it happens.  Remove soap buildup in the tub or shower regularly.  Attach bath mats securely with double-sided non-slip rug tape.  Do not have throw rugs and other things on the floor that can make you trip. What can I do in the bedroom?  Use night lights.  Make sure that you have a light by your bed that is easy to reach.  Do not use any sheets or blankets that are too big for your bed. They should not hang down onto the floor.  Have a firm chair that has side arms. You can use this for support while you get dressed.  Do not have throw rugs and other things on the floor that can make you trip. What can I do in the  kitchen?  Clean up any spills right away.  Avoid walking on wet floors.  Keep items that you use a lot in easy-to-reach places.  If you need to reach something above you, use a strong step stool that has a grab bar.  Keep electrical cords out of the way.  Do not use floor polish or wax that makes floors slippery. If you must use wax, use non-skid floor wax.  Do not have throw rugs and other things on the floor that can make you trip. What can I do with my stairs?  Do not leave any items on the stairs.  Make sure that there are handrails on both sides of the stairs and use them. Fix handrails that are broken or loose. Make sure that handrails are as long as the stairways.  Check any carpeting to make sure that it is firmly attached to the stairs. Fix any carpet that is loose or worn.  Avoid having throw rugs at the top or bottom of the stairs. If you do have  throw rugs, attach them to the floor with carpet tape.  Make sure that you have a light switch at the top of the stairs and the bottom of the stairs. If you do not have them, ask someone to add them for you. What else can I do to help prevent falls?  Wear shoes that:  Do not have high heels.  Have rubber bottoms.  Are comfortable and fit you well.  Are closed at the toe. Do not wear sandals.  If you use a stepladder:  Make sure that it is fully opened. Do not climb a closed stepladder.  Make sure that both sides of the stepladder are locked into place.  Ask someone to hold it for you, if possible.  Clearly mark and make sure that you can see:  Any grab bars or handrails.  First and last steps.  Where the edge of each step is.  Use tools that help you move around (mobility aids) if they are needed. These include:  Canes.  Walkers.  Scooters.  Crutches.  Turn on the lights when you go into a dark area. Replace any light bulbs as soon as they burn out.  Set up your furniture so you have a clear path. Avoid moving your furniture around.  If any of your floors are uneven, fix them.  If there are any pets around you, be aware of where they are.  Review your medicines with your doctor. Some medicines can make you feel dizzy. This can increase your chance of falling. Ask your doctor what other things that you can do to help prevent falls. This information is not intended to replace advice given to you by your health care provider. Make sure you discuss any questions you have with your health care provider. Document Released: 05/02/2009 Document Revised: 12/12/2015 Document Reviewed: 08/10/2014 Elsevier Interactive Patient Education  2017 Elsevier Inc.  

## 2019-10-27 NOTE — Patient Instructions (Addendum)
Please return in 3 months for diabetes and blood pressure follow up  Please increase your metformin to twice a day and increase the amlodipine to 10mg  daily. I have sent in these medications to your pharmacy today to update the changes.  You may use the voltaren gel on your knee for knee pain as needed. Ice the knee 2-3x/ day for a few days.  I will call you with your xray results   Avoid sweets and pastries to help get your sugars under better control   If you have any questions or concerns, please don't hesitate to send me a message via MyChart or call the office at [phone]. Thank you for visiting with [place] today! It's our pleasure caring for you.

## 2019-10-27 NOTE — Progress Notes (Signed)
Subjective  CC:  Chief Complaint  Patient presents with  . Diabetes    <140  . Hypertension    HPI: Carla Foster is a 79 y.o. female who presents to the office today for follow up of diabetes and problems listed above in the chief complaint.   Diabetes follow up: Her diabetic control is reported as Unchanged. She takes her met only once a day. Diet is fair. Denies sxs of She denies exertional CP or SOB or symptomatic hypoglycemia. She denies foot sores or paresthesias.   Breast cancer s/p lumpectomy, radiation and now on aromatase inhibitor. Reviewed Dr. Virgie Dad notes. Recovering well.   HLD on statin  CHF with recent cards visit reviewed.  HTN: uncontrolled. Cards recently increased losartan to 129m at that time. Feeling well. Taking medications w/o adverse effects. No symptoms of CHF, angina; no palpitations, sob, cp or lower extremity edema. Compliant with meds.   C/o right knee pain x months. Worse with walking. No pain at night. Feels it is swollen. No injury. Hasn't taken any medications for it.   Wt Readings from Last 3 Encounters:  10/27/19 153 lb 3.2 oz (69.5 kg)  09/12/19 155 lb 12.8 oz (70.7 kg)  08/29/19 158 lb 3.2 oz (71.8 kg)    BP Readings from Last 3 Encounters:  10/27/19 (!) 170/74  09/12/19 (!) 146/68  08/29/19 (!) 163/79    Assessment  1. Type 2 diabetes mellitus with diabetic nephropathy, without long-term current use of insulin ([place]   2. Malignant hypertension   3. Hypertensive heart disease with heart failure (HCC)   4. Stage 3a chronic kidney disease   5. Chronic heart failure with preserved ejection fraction (HCC)   6. Anemia of chronic disease   7. Pulmonary hypertension ([place]   8. Chronic pain of right knee      Plan   Diabetes is currently marginally controlled. Will increase met to bid and monitor diet. Recheck 3 months.   HTN: uncontrolled today. Increase amlodipine to 142mdaily.   CHF is stable  Knee pain: with mild  effusion. Pt declines arthrocentesis today. Will ice, voltaren gel and check xray. Recheck here if not improving. Joint is stable.   Follow up: Return in about 3 months (around 01/26/2020) for follow up of diabetes and hypertension.. Orders Placed This Encounter  Procedures  . DG Knee Complete 4 Views Right  . POCT HgB A1C   Meds ordered this encounter  Medications  . amLODipine (NORVASC) 10 MG tablet    Sig: Take 1 tablet (10 mg total) by mouth daily.    Dispense:  90 tablet    Refill:  3    Increasing dose from 5  . metFORMIN (GLUCOPHAGE) 1000 MG tablet    Sig: Take 1 tablet (1,000 mg total) by mouth 2 (two) times daily with a meal.    Dispense:  180 tablet    Refill:  3  . diclofenac Sodium (VOLTAREN) 1 % GEL    Sig: Apply 4 g topically 4 (four) times daily as needed (right knee pain).    Dispense:  350 g    Refill:  5      Immunization History  Administered Date(s) Administered  . Fluad Quad(high Dose 65+) 04/10/2019  . Influenza, High Dose Seasonal PF 04/20/2016, 04/20/2017, 04/15/2018, 06/20/2019  . Pneumococcal Conjugate-13 04/10/2019  . Pneumococcal Polysaccharide-23 05/09/2007  . Td 03/30/2007    Diabetes Related Lab Review: Lab Results  Component Value Date   HGBA1C 8.1 (  A) 10/27/2019   HGBA1C 6.7 (A) 04/10/2019   HGBA1C 6.9 (A) 01/06/2019    No results found for: Derl Barrow Lab Results  Component Value Date   CREATININE 1.29 (H) 10/03/2019   BUN 33 (H) 10/03/2019   NA 135 10/03/2019   K 4.7 10/03/2019   CL 96 10/03/2019   CO2 24 10/03/2019   Lab Results  Component Value Date   CHOL 139 04/10/2019   CHOL 116 07/26/2018   CHOL 131 01/11/2018   Lab Results  Component Value Date   HDL 61.10 04/10/2019   HDL 59 07/26/2018   HDL 58 01/11/2018   Lab Results  Component Value Date   LDLCALC 62 04/10/2019   LDLCALC 47 07/26/2018   LDLCALC 57 01/11/2018   Lab Results  Component Value Date   TRIG 77.0 04/10/2019   TRIG 49 07/26/2018    TRIG 78 01/11/2018   Lab Results  Component Value Date   CHOLHDL 2 04/10/2019   CHOLHDL 2.0 07/26/2018   CHOLHDL 2.3 01/11/2018   Lab Results  Component Value Date   WBC 8.0 06/07/2019   HGB 10.0 (L) 06/07/2019   HCT 31.2 (L) 06/07/2019   MCV 91.5 06/07/2019   PLT 270 06/07/2019    I have reviewed the PMH, Fam and Soc history. Patient Active Problem List   Diagnosis Date Noted  . Hypertensive heart disease with heart failure ([place]) 09/12/2019    Priority: High  . Malignant neoplasm of upper-outer quadrant of right breast in female, estrogen receptor positive ([place]) 06/05/2019    Priority: High  . PAD (peripheral artery disease) ([place]) - asymptomatic 04/27/2019    Priority: High    Quantiflo ABI's by in home nurse assessment: Bilateral abnl: betweein 0.6 and 0.7 Can't tolerate aspirin   . Anemia of chronic disease 01/10/2019    Priority: High    12/2018 labs   . CKD (chronic kidney disease) stage 3, GFR 30-59 ml/min 01/06/2019    Priority: High  . Chronic heart failure with preserved ejection fraction ([place]) 09/05/2018    Priority: High  . H/O: upper GI bleed 09/05/2018    Priority: High    11/2017, Peptic ulcer by EGD, Dr. Carlean Purl; hospitalized   . Type 2 diabetes mellitus ([place]) 07/26/2018    Priority: High  . Malignant hypertension 07/26/2018    Priority: High  . Pulmonary hypertension ([place]) 08/27/2015    Priority: High  . Hyperlipidemia 01/23/2011    Priority: High  . Osteopenia 04/28/2019    Priority: Medium    dexa 04/2019 T = - 2.3 L/spine lowest. Recheck 2 years.    . Iron deficiency anemia 08/22/2018    Priority: Medium    Due to peptic ulcer/UGI bleed hospitalized 11/2017; EGD by Dr. Carlean Purl at that time.    Marland Kitchen Hearing loss 08/27/2015    Priority: Medium  . Asthma 08/27/2015    Priority: Medium  . Leg edema 12/08/2018  . Acute on chronic diastolic CHF (congestive heart failure) ([employer]) 07/31/2018  . Hyponatremia 11/20/2017    Social History: Patient   reports that she quit smoking about 45 years ago. Her smoking use included cigarettes. She has a 18.00 pack-year smoking history. She has never used smokeless tobacco. She reports that she does not drink alcohol or use drugs.  Review of Systems: Ophthalmic: negative for eye pain, loss of vision or double vision Cardiovascular: negative for chest pain Respiratory: negative for SOB or persistent cough Gastrointestinal: negative for abdominal pain Genitourinary: negative for dysuria  or gross hematuria MSK: negative for foot lesions Neurologic: negative for weakness or gait disturbance  Objective  Vitals: BP (!) 170/74   Pulse 78   Temp 98 F (36.7 C) (Temporal)   Resp 16   Ht 5' 6"  (1.676 m)   Wt 153 lb 3.2 oz (69.5 kg)   SpO2 94%   BMI 24.73 kg/m  General: well appearing, no acute distress  Psych:  Alert and oriented, normal mood and affect, looks good HEENT:  Normocephalic, atraumatic, supple neck  Cardiovascular:  Nl S1 and S2, RRR without murmur, gallop or rub. no edema Respiratory:  Good breath sounds bilaterally, CTAB with normal effort, no rales Gastrointestinal: normal BS, soft, nontender Skin:  Warm, no rashes Neurologic:   Mental status is normal. normal gait Right knee with small effusion and mild medial ttp w/o warmth or redness. From. No crepitus. No laxity, antalgic gait Foot exam: no erythema, pallor, or cyanosis visible nl proprioception and sensation to monofilament testing bilaterally, +2 distal pulses bilaterally    Diabetic education: ongoing education regarding chronic disease management for diabetes was given today. We continue to reinforce the ABC's of diabetic management: A1c (<7 or 8 dependent upon patient), tight blood pressure control, and cholesterol management with goal LDL < 100 minimally. We discuss diet strategies, exercise recommendations, medication options and possible side effects. At each visit, we review recommended immunizations and preventive  care recommendations for diabetics and stress that good diabetic control can prevent other problems. See below for this patient's data.    Commons side effects, risks, benefits, and alternatives for medications and treatment plan prescribed today were discussed, and the patient expressed understanding of the given instructions. Patient is instructed to call or message via MyChart if he/she has any questions or concerns regarding our treatment plan. No barriers to understanding were identified. We discussed Red Flag symptoms and signs in detail. Patient expressed understanding regarding what to do in case of urgent or emergency type symptoms.   Medication list was reconciled, printed and provided to the patient in AVS. Patient instructions and summary information was reviewed with the patient as documented in the AVS. This note was prepared with assistance of Dragon voice recognition software. Occasional wrong-word or sound-a-like substitutions may have occurred due to the inherent limitations of voice recognition software  This visit occurred during the SARS-CoV-2 public health emergency.  Safety protocols were in place, including screening questions prior to the visit, additional usage of staff PPE, and extensive cleaning of exam room while observing appropriate contact time as indicated for disinfecting solutions.

## 2019-10-27 NOTE — Progress Notes (Signed)
Subjective:   Carla Foster is a 79 y.o. female who presents for Medicare Annual (Subsequent) preventive examination.  Review of Systems:   Cardiac Risk Factors include: advanced age (>16men, >54 women);diabetes mellitus;hypertension    Objective:     Vitals: BP 140/68   Temp 98 F (36.7 C) (Temporal)   Ht 5\' 6"  (1.676 m)   Wt 153 lb 3.5 oz (69.5 kg)   SpO2 94%   BMI 24.73 kg/m   Body mass index is 24.73 kg/m.  Advanced Directives 10/27/2019 07/25/2019 07/10/2019 06/22/2019 07/26/2018 07/25/2018 11/23/2017  Does Patient Have a Medical Advance Directive? Yes Yes No Yes - No No  Type of Advance Directive Living will;Healthcare Power of [place];Living will - [place];Living will - - -  Does patient want to make changes to medical advance directive? No - Patient declined - - No - Patient declined - - -  Copy of [place] in Chart? Yes - validated most recent copy scanned in chart (See row information) No - copy requested - No - copy requested - - -  Would patient like information on creating a medical advance directive? - No - Patient declined No - Patient declined - No - Patient declined - No - Patient declined    Tobacco Social History   Tobacco Use  Smoking Status Former Smoker  . Packs/day: 1.00  . Years: 18.00  . Pack years: 18.00  . Types: Cigarettes  . Quit date: 01/13/1974  . Years since quitting: 45.8  Smokeless Tobacco Never Used     Counseling given: Not Answered   Clinical Intake:  Pre-visit preparation completed: Yes  Diabetes: Yes CBG done?: Yes CBG resulted in Enter/ Edit results?: Yes Did pt. bring in CBG monitor from home?: No  How often do you need to have someone help you when you read instructions, pamphlets, or other written materials from your doctor or pharmacy?: 2 - Rarely  Interpreter Needed?: No  Information entered by :: Denman George LPN  Past Medical History:  Diagnosis  Date  . Acute blood loss anemia   . AKI (acute kidney injury) ([place])   . Asthma   . CKD (chronic kidney disease) stage 3, GFR 30-59 ml/min 01/06/2019  . Complication of anesthesia    nausea  . Diabetes mellitus    type 2  . Family history of upper GI bleeding 09/05/2018   Dr. Carlean Purl, hospitalized: egd 11/2017  . GERD (gastroesophageal reflux disease)   . GI bleed   . H/O: upper GI bleed 09/05/2018   11/2017, Peptic ulcer by EGD, Dr. Carlean Purl; hospitalized  . Hematemesis   . History of radiation therapy 08/07/19- 09/01/19   Right Breast 15 fx of 2.67 Gy each to total 40.05 Gy. Right breast boost of 5 fx of 2 Gy each to total 10 Gy  . Hyperlipidemia   . Hypertension   . Melena   . Osteopenia 04/28/2019   dexa 04/2019 T = - 2.3 L/spine lowest. Recheck 2 years.   Marland Kitchen PAD (peripheral artery disease) ([place]) - asymptomatic 04/27/2019   Quantiflo ABI's by in home nurse assessment: Bilateral abnl: betweein 0.6 and 0.7 Can't tolerate aspirin  . PONV (postoperative nausea and vomiting)    Past Surgical History:  Procedure Laterality Date  . ABDOMINAL HYSTERECTOMY    . BLADDER SURGERY    . BREAST LUMPECTOMY WITH RADIOACTIVE SEED LOCALIZATION Right 07/10/2019   Procedure: RIGHT BREAST LUMPECTOMY WITH RADIOACTIVE SEED  LOCALIZATION;  Surgeon: Jovita Kussmaul, MD;  Location: [facility];  Service: General;  Laterality: Right;  . BREAST SURGERY    . CATARACT EXTRACTION W/ INTRAOCULAR LENS IMPLANT Left 11/13/2015  . CATARACT EXTRACTION W/ INTRAOCULAR LENS IMPLANT Right   . CERVICAL DISCECTOMY  08/23/00   C5-6, C6-7 anterior cervical diskectomy with fibular bone bank fusion followed by [place]antis anterior cervical plating with the operating microscope  --  SURGEON:  Faythe Ghee, M.D.  . ESOPHAGOGASTRODUODENOSCOPY (EGD) WITH PROPOFOL N/A 11/21/2017   Procedure: ESOPHAGOGASTRODUODENOSCOPY (EGD) WITH PROPOFOL;  Surgeon: Gatha Mayer, MD;  Location: [facility];  Service: Endoscopy;  Laterality:  N/A;  . PARTIAL HYSTERECTOMY    . TEE WITHOUT CARDIOVERSION N/A 08/01/2018   Procedure: TRANSESOPHAGEAL ECHOCARDIOGRAM (TEE);  Surgeon: Skeet Latch, MD;  Location: [facility];  Service: Cardiovascular;  Laterality: N/A;   Family History  Problem Relation Age of Onset  . Emphysema Father   . Stroke Mother   . Hypertension Mother   . Atrial fibrillation Mother        heart flutter  . Asthma Daughter   . Cancer Daughter    Social History   Socioeconomic History  . Marital status: Widowed    Spouse name: Not on file  . Number of children: Not on file  . Years of education: Not on file  . Highest education level: Not on file  Occupational History  . Not on file  Tobacco Use  . Smoking status: Former Smoker    Packs/day: 1.00    Years: 18.00    Pack years: 18.00    Types: Cigarettes    Quit date: 01/13/1974    Years since quitting: 45.8  . Smokeless tobacco: Never Used  Substance and Sexual Activity  . Alcohol use: No  . Drug use: No  . Sexual activity: Not Currently  Other Topics Concern  . Not on file  Social History Narrative  . Not on file   Social Determinants of Health   Financial Resource Strain:   . Difficulty of Paying Living Expenses:   Food Insecurity:   . Worried About Charity fundraiser in the Last Year:   . Arboriculturist in the Last Year:   Transportation Needs:   . Film/video editor (Medical):   Marland Kitchen Lack of Transportation (Non-Medical):   Physical Activity:   . Days of Exercise per Week:   . Minutes of Exercise per Session:   Stress:   . Feeling of Stress :   Social Connections:   . Frequency of Communication with Friends and Family:   . Frequency of Social Gatherings with Friends and Family:   . Attends Religious Services:   . Active Member of Clubs or Organizations:   . Attends Archivist Meetings:   Marland Kitchen Marital Status:     Outpatient Encounter Medications as of 10/27/2019  Medication Sig  . albuterol (VENTOLIN HFA) 108  (90 Base) MCG/ACT inhaler Inhale 2 puffs into the lungs every 6 (six) hours as needed for wheezing or shortness of breath.  Marland Kitchen amLODipine (NORVASC) 10 MG tablet Take 1 tablet (10 mg total) by mouth daily.  . Calcium Carbonate-Vitamin D (CALCIUM 600 + D PO) Take 1 tablet by mouth daily.    . carvedilol (COREG) 25 MG tablet Take 1 tablet (25 mg total) by mouth 2 (two) times daily.  . diclofenac Sodium (VOLTAREN) 1 % GEL Apply 4 g topically 4 (four) times daily as needed (right  knee pain).  Marland Kitchen doxazosin (CARDURA) 4 MG tablet Take 1 tablet (4 mg total) by mouth at bedtime.  . fenofibrate 160 MG tablet Take 1 tablet (160 mg total) by mouth daily.  . ferrous sulfate 325 (65 FE) MG tablet Take 1 tablet (325 mg total) by mouth daily.  . furosemide (LASIX) 40 MG tablet TAKE 1 TABLET BY MOUTH DAILY  . losartan (COZAAR) 100 MG tablet Take 1 tablet (100 mg total) by mouth daily.  . metFORMIN (GLUCOPHAGE) 1000 MG tablet Take 1 tablet (1,000 mg total) by mouth 2 (two) times daily with a meal.  . pantoprazole (PROTONIX) 40 MG tablet Take 40 mg by mouth daily.  Marland Kitchen Propylene Glycol (SYSTANE BALANCE) 0.6 % SOLN Apply 1 drop to eye as needed (dry eyes).  . simvastatin (ZOCOR) 20 MG tablet Take 1 tablet (20 mg total) by mouth at bedtime.  . vitamin A 8000 UNIT capsule Take 8,000 Units by mouth daily.   . vitamin C (ASCORBIC ACID) 500 MG tablet Take 500 mg by mouth daily.    . [DISCONTINUED] amLODipine (NORVASC) 10 MG tablet Take 1 tablet (10 mg total) by mouth daily.  . [DISCONTINUED] diclofenac Sodium (VOLTAREN) 1 % GEL Apply 4 g topically 4 (four) times daily as needed (right knee pain).  . [DISCONTINUED] furosemide (LASIX) 40 MG tablet TAKE 1 TABLET BY MOUTH DAILY OFFICE VISIT DUE NOW  . [DISCONTINUED] metFORMIN (GLUCOPHAGE) 1000 MG tablet Take 1 tablet (1,000 mg total) by mouth 2 (two) times daily with a meal.  . anastrozole (ARIMIDEX) 1 MG tablet Take 1 tablet (1 mg total) by mouth daily. (Patient not taking:  Reported on 10/27/2019)   No facility-administered encounter medications on file as of 10/27/2019.    Activities of Daily Living In your present state of health, do you have any difficulty performing the following activities: 10/27/2019 10/27/2019  Hearing? Y N  Vision? N N  Difficulty concentrating or making decisions? N N  Walking or climbing stairs? N N  Comment - -  Dressing or bathing? N N  Doing errands, shopping? Y N  Preparing Food and eating ? N -  Using the Toilet? N -  In the past six months, have you accidently leaked urine? N -  Do you have problems with loss of bowel control? N -  Managing your Medications? N -  Managing your Finances? N -  Housekeeping or managing your Housekeeping? N -  Some recent data might be hidden    Patient Care Team: Leamon Arnt, MD as PCP - General (Family Medicine) Nahser, Wonda Cheng, MD as PCP - Cardiology (Cardiology) Juanita Craver, MD as Consulting Physician (Gastroenterology) Mammography, [facility] (Diagnostic Radiology) Mauro Kaufmann, RN as Oncology Nurse Navigator Rockwell Germany, RN as Oncology Nurse Navigator Magrinat, Virgie Dad, MD as Consulting Physician (Oncology) Eppie Gibson, MD as Consulting Physician (Radiation Oncology) Rutherford Guys, MD as Consulting Physician (Ophthalmology) Vicie Mutters, MD as Consulting Physician (Otolaryngology)    Assessment:   This is a routine wellness examination for [place].  Exercise Activities and Dietary recommendations Current Exercise Habits: The patient does not participate in regular exercise at present  Goals   None     Fall Risk Fall Risk  10/27/2019 10/27/2019 04/10/2019  Falls in the past year? 1 1 0  Number falls in past yr: 0 0 0  Injury with Fall? 0 0 0  Risk for fall due to : History of fall(s);Impaired mobility - -  Follow up Falls prevention  discussed;Education provided;Falls evaluation completed - Falls evaluation completed   Is the patient's home free of loose throw rugs in  walkways, pet beds, electrical cords, etc?   yes      Grab bars in the bathroom? yes      Handrails on the stairs?   yes      Adequate lighting?   yes  Timed Get Up and Go performed: completed and within normal timeframe; no gait abnormalities noted   Depression Screen PHQ 2/9 Scores 10/27/2019 04/10/2019  PHQ - 2 Score 0 0     Cognitive Function     6CIT Screen 10/27/2019  What Year? 0 points  What month? 0 points  What time? 0 points  Count back from 20 0 points  Months in reverse 0 points  Repeat phrase 2 points  Total Score 2    Immunization History  Administered Date(s) Administered  . Fluad Quad(high Dose 65+) 04/10/2019  . Influenza, High Dose Seasonal PF 04/20/2016, 04/20/2017, 04/15/2018, 06/20/2019  . Pneumococcal Conjugate-13 04/10/2019  . Pneumococcal Polysaccharide-23 05/09/2007  . Td 03/30/2007    Qualifies for Shingles Vaccine? Discussed and patient will check with pharmacy for coverage.  Patient education handout provided   Screening Tests Health Maintenance  Topic Date Due  . INFLUENZA VACCINE  02/18/2020  . FOOT EXAM  04/09/2020  . HEMOGLOBIN A1C  04/27/2020  . OPHTHALMOLOGY EXAM  07/30/2020  . DEXA SCAN  04/26/2021  . PNA vac Low Risk Adult  Completed  . TETANUS/TDAP  Discontinued    Cancer Screenings: Lung: Low Dose CT Chest recommended if Age 77-80 years, 30 pack-year currently smoking OR have quit w/in 15years. Patient does not qualify. Breast:  Up to date on Mammogram? Yes   Up to date of Bone Density/Dexa? Yes Colorectal: No longer indicated; last colonoscopy 03/25/18     Plan:  I have personally reviewed and addressed the Medicare Annual Wellness questionnaire and have noted the following in the patient's chart:  A. Medical and social history B. Use of alcohol, tobacco or illicit drugs  C. Current medications and supplements D. Functional ability and status E.  Nutritional status F.  Physical activity G. Advance directives H. List of  other physicians I.  Hospitalizations, surgeries, and ER visits in previous 12 months J.  [place] such as hearing and vision if needed, cognitive and depression L. Referrals, records requested, and appointments- none   In addition, I have reviewed and discussed with patient certain preventive protocols, quality metrics, and best practice recommendations. A written personalized care plan for preventive services as well as general preventive health recommendations were provided to patient.   Signed,  Denman George, LPN  Nurse Health Advisor   Nurse Notes: Patient requested that rx be sent to [facility] and cancelled from [facility].  Request completed and note faxed to [facility] to cancel rx sent previously today.

## 2019-11-22 IMAGING — DX DG CHEST 1V PORT
1 series · 1 of 1 positions shown · non-contrast
Comparison: 07/27/2018.

CLINICAL DATA: Pneumonia.

EXAM:
PORTABLE CHEST 1 VIEW

[chest]
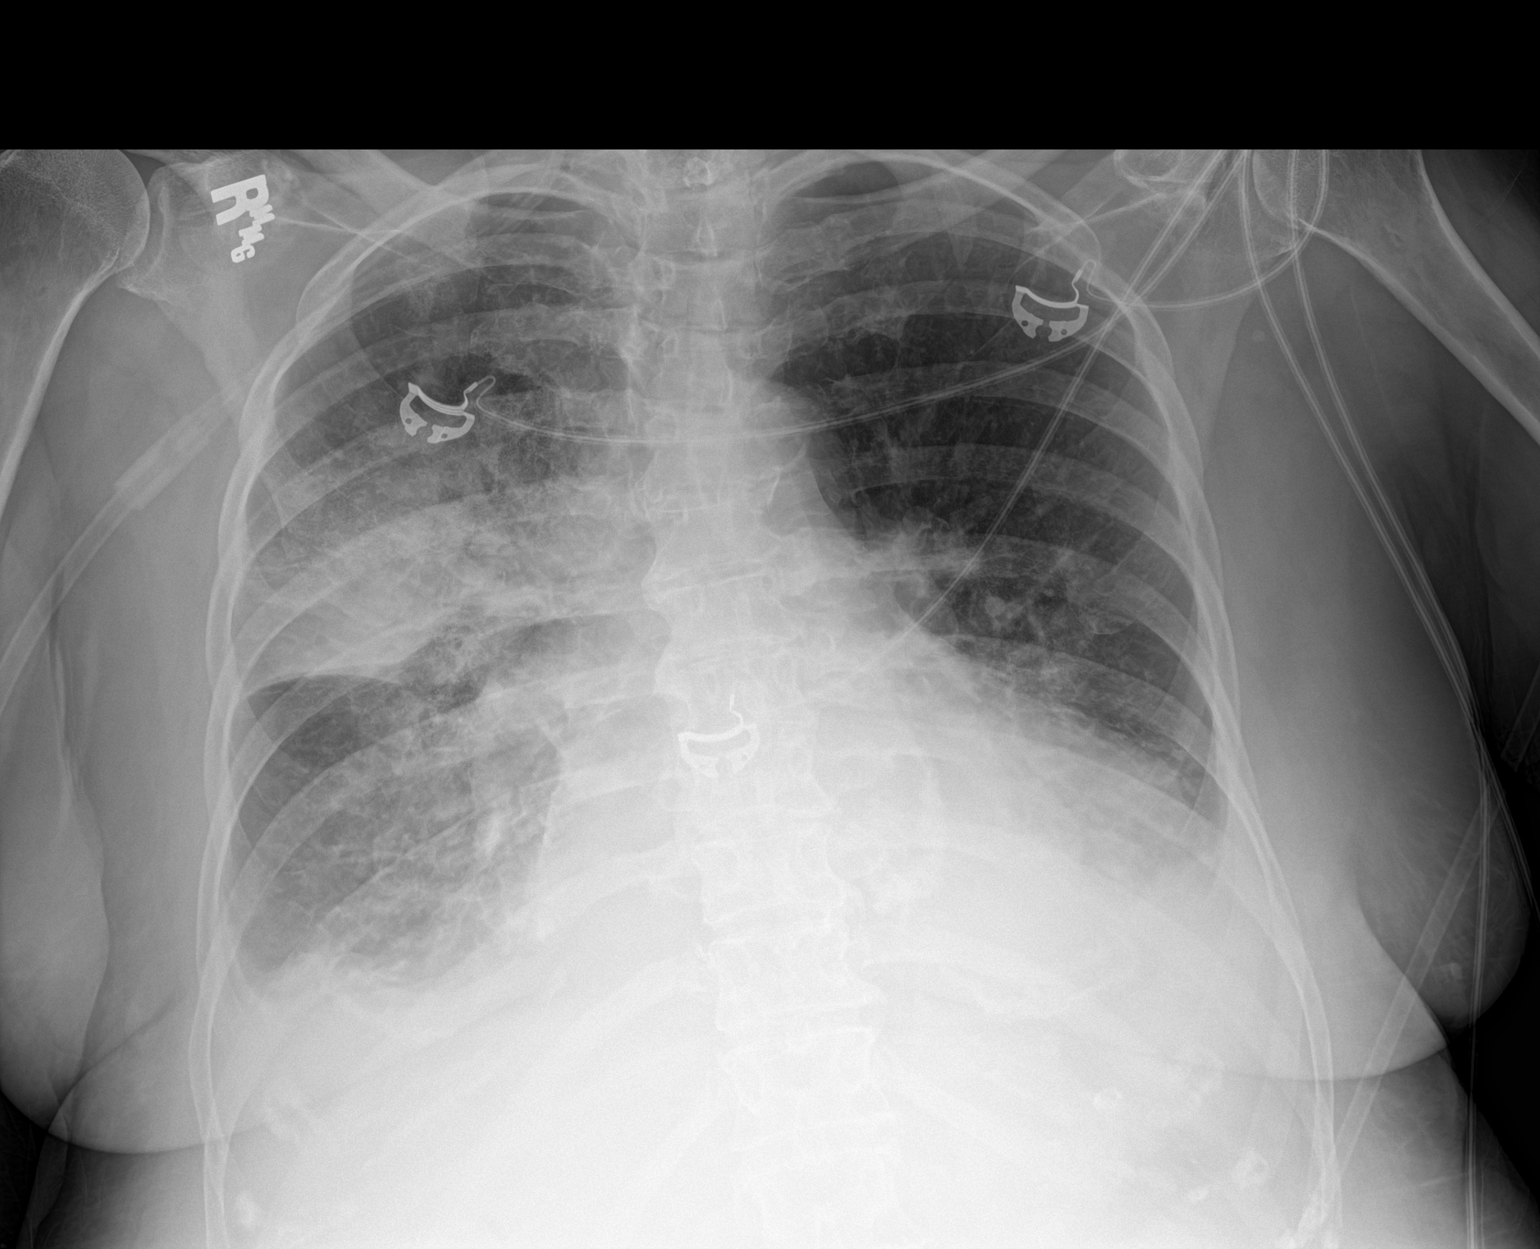

[1 of 1 positions shown; findings below may reference images not displayed]

FINDINGS: Cardiomegaly. Persistent prominent right upper lobe and bibasilar
infiltrates/edema and atelectasis noted. Interim slight improvement.
Small bilateral pleural effusions. No pneumothorax stable
cardiomegaly. No acute bony abnormality. Prior cervicothoracic
fusion.
IMPRESSION: 1. Persistent prominent right upper lobe and bibasilar
infiltrates/edema and atelectasis again noted. Slight improvement
from prior exam. Small bilateral pleural effusions.

2.  Stable cardiomegaly.

## 2019-11-26 IMAGING — DX DG CHEST 1V PORT
1 series · 1 of 1 positions shown · non-contrast
Comparison: Chest x-rays dated 07/28/2018 and 07/27/2018 and
07/25/2018

CLINICAL DATA: Central venous catheter insertion. Pulmonary
infiltrates. Effusions.

EXAM:
PORTABLE CHEST 1 VIEW

[chest]
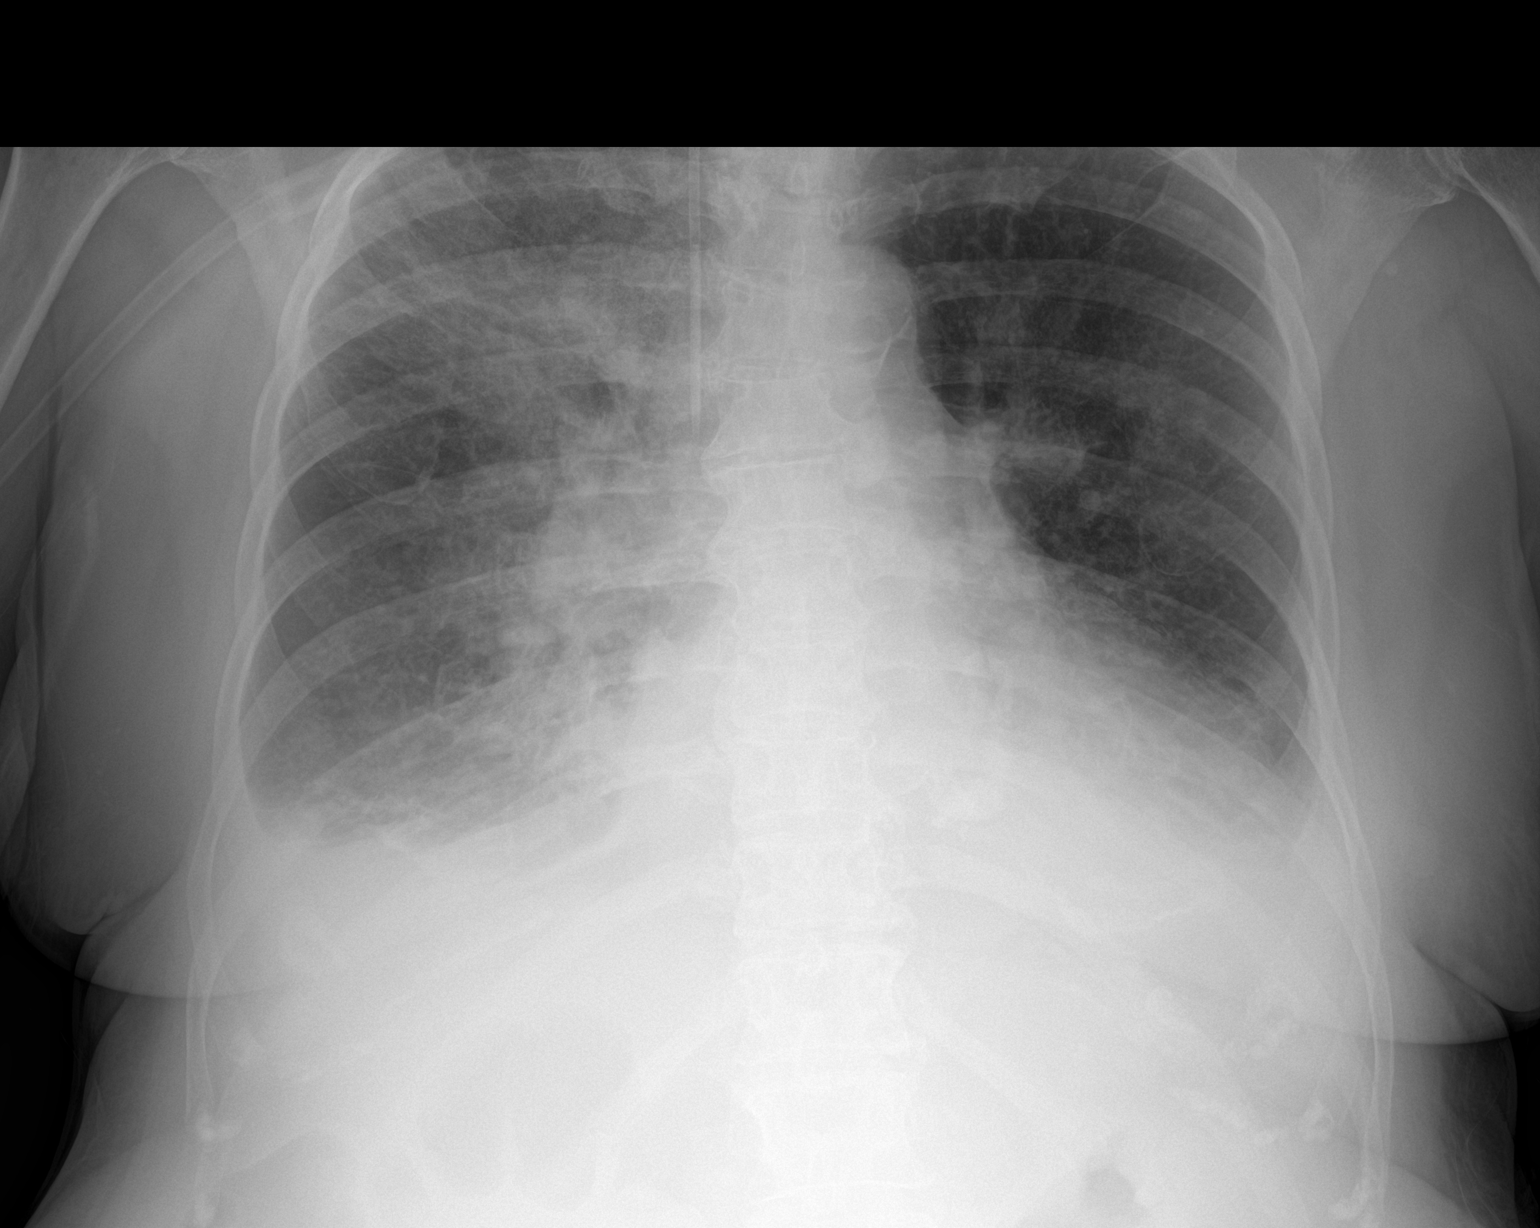

[1 of 1 positions shown; findings below may reference images not displayed]

FINDINGS: Right-sided central line has been inserted. The tip is in the
superior vena cava at the level of the azygos vein. No pneumothorax.

There has been partial clearing of the right upper lobe infiltrate.
Improved aeration at both lung bases. Small bilateral pleural
effusions persist. Heart size and vascularity are normal. Aortic
atherosclerosis. No acute bone abnormality.
IMPRESSION: 1. Central line appears in good position with no pneumothorax.
2. Interval improvement in bilateral infiltrates.
3. Persistent small bilateral effusions.

## 2019-11-28 ENCOUNTER — Other Ambulatory Visit: Payer: Self-pay | Admitting: Family Medicine

## 2020-01-04 ENCOUNTER — Other Ambulatory Visit: Payer: Self-pay

## 2020-01-04 MED ORDER — FUROSEMIDE 40 MG PO TABS: ORAL_TABLET | ORAL | 3 refills | Status: DC

## 2020-01-05 ENCOUNTER — Other Ambulatory Visit: Payer: Self-pay | Admitting: Family Medicine

## 2020-01-29 ENCOUNTER — Encounter: Payer: Self-pay | Admitting: Family Medicine

## 2020-01-29 ENCOUNTER — Ambulatory Visit (INDEPENDENT_AMBULATORY_CARE_PROVIDER_SITE_OTHER): Payer: Medicare Other | Admitting: Family Medicine

## 2020-01-29 ENCOUNTER — Other Ambulatory Visit: Payer: Self-pay

## 2020-01-29 VITALS — BP 124/70 | Ht 66.0 in | Wt 154.6 lb

## 2020-01-29 DIAGNOSIS — I739 Peripheral vascular disease, unspecified: Secondary | ICD-10-CM

## 2020-01-29 DIAGNOSIS — E1121 Type 2 diabetes mellitus with diabetic nephropathy: Secondary | ICD-10-CM | POA: Diagnosis not present

## 2020-01-29 DIAGNOSIS — I110 Hypertensive heart disease with heart failure: Secondary | ICD-10-CM | POA: Diagnosis not present

## 2020-01-29 DIAGNOSIS — I5032 Chronic diastolic (congestive) heart failure: Secondary | ICD-10-CM

## 2020-01-29 DIAGNOSIS — M1711 Unilateral primary osteoarthritis, right knee: Secondary | ICD-10-CM

## 2020-01-29 DIAGNOSIS — I272 Pulmonary hypertension, unspecified: Secondary | ICD-10-CM | POA: Diagnosis not present

## 2020-01-29 LAB — POCT GLYCOSYLATED HEMOGLOBIN (HGB A1C): Hemoglobin A1C: 6.6 % — AB (ref 4.0–5.6)

## 2020-01-29 NOTE — Patient Instructions (Addendum)
Please return in 3 months for your annual complete physical; please come fasting.  Things look good!  If you have any questions or concerns, please don't hesitate to send me a message via MyChart or call the office at [phone]. Thank you for visiting with [place] today! It's our pleasure caring for you.

## 2020-01-29 NOTE — Progress Notes (Signed)
Subjective  CC:  Chief Complaint  Patient presents with  . Hypertension  . Diabetes    HPI: Carla Foster is a 79 y.o. female who presents to the office today for follow up of diabetes and problems listed above in the chief complaint.   Diabetes follow up: Her diabetic control is reported as Improved.  We increased Metformin to 1000 mg twice daily at her last visit and she is tolerating this well. She denies exertional CP or SOB or symptomatic hypoglycemia. She denies foot sores or paresthesias.   Hypertension: We increased amlodipine to 10 mg daily, she continues on losartan.  Feeling well without chest pain, shortness of breath.  She has intermittent pedal swelling and is on Lasix given her history of heart failure.  She denies symptoms of claudication  Right knee pain is much improved.  Last visit she had a small effusion.  She is using topical Voltaren gel at night as needed.  Swelling has resolved.  Pain is minimal and intermittent at this point.  No locking or give way  Wt Readings from Last 3 Encounters:  01/29/20 154 lb 9.6 oz (70.1 kg)  10/27/19 153 lb 3.5 oz (69.5 kg)  10/27/19 153 lb 3.2 oz (69.5 kg)    BP Readings from Last 3 Encounters:  01/29/20 124/70  10/27/19 140/68  10/27/19 (!) 170/74    Assessment  1. Type 2 diabetes mellitus with diabetic nephropathy, without long-term current use of insulin ([place])   2. Hypertensive heart disease with heart failure ([place])   3. Chronic heart failure with preserved ejection fraction ([place])   4. Pulmonary hypertension ([place])   5. PAD (peripheral artery disease) (HCC) - asymptomatic   6. Primary osteoarthritis of right knee      Plan   Diabetes is currently very well controlled.  Control is much improved.  Continue diet and Metformin.  Will be due for lab work at next visit  Hypertension: This medical condition is well controlled. There are no signs of complications, medication side effects, or red flags. Patient is  instructed to continue the current treatment plan without change in therapies or medications.  Heart failure stable  Follow-up with cardiology  Presumed osteoarthritis of right knee controlled with conservative measures.  If worsens will need x-rays.  Follow up: Return in about 3 months (around 04/30/2020) for follow up of diabetes and hypertension, complete physical.. Orders Placed This Encounter  Procedures  . POCT HgB A1C   No orders of the defined types were placed in this encounter.     Immunization History  Administered Date(s) Administered  . Fluad Quad(high Dose 65+) 04/10/2019  . Influenza, High Dose Seasonal PF 04/20/2016, 04/20/2017, 04/15/2018, 06/20/2019  . Pneumococcal Conjugate-13 04/10/2019  . Pneumococcal Polysaccharide-23 05/09/2007  . Td 03/30/2007    Diabetes Related Lab Review: Lab Results  Component Value Date   HGBA1C 6.6 (A) 01/29/2020   HGBA1C 8.1 (A) 10/27/2019   HGBA1C 6.7 (A) 04/10/2019    No results found for: Derl Barrow Lab Results  Component Value Date   CREATININE 1.29 (H) 10/03/2019   BUN 33 (H) 10/03/2019   NA 135 10/03/2019   K 4.7 10/03/2019   CL 96 10/03/2019   CO2 24 10/03/2019   Lab Results  Component Value Date   CHOL 139 04/10/2019   CHOL 116 07/26/2018   CHOL 131 01/11/2018   Lab Results  Component Value Date   HDL 61.10 04/10/2019   HDL 59 07/26/2018   HDL  58 01/11/2018   Lab Results  Component Value Date   LDLCALC 62 04/10/2019   LDLCALC 47 07/26/2018   LDLCALC 57 01/11/2018   Lab Results  Component Value Date   TRIG 77.0 04/10/2019   TRIG 49 07/26/2018   TRIG 78 01/11/2018   Lab Results  Component Value Date   CHOLHDL 2 04/10/2019   CHOLHDL 2.0 07/26/2018   CHOLHDL 2.3 01/11/2018   No results found for: LDLDIRECT The ASCVD Risk score Mikey Bussing DC Jr., et al., 2013) failed to calculate for the following reasons:   The 2013 ASCVD risk score is only valid for ages 38 to 50 I have reviewed the  [place], Fam and Soc history. Patient Active Problem List   Diagnosis Date Noted  . Hypertensive heart disease with heart failure ([place]) 09/12/2019    Priority: High  . Malignant neoplasm of upper-outer quadrant of right breast in female, estrogen receptor positive ([place]) 06/05/2019    Priority: High  . PAD (peripheral artery disease) ([place]) - asymptomatic 04/27/2019    Priority: High    Quantiflo ABI's by in home nurse assessment: Bilateral abnl: betweein 0.6 and 0.7 Can't tolerate aspirin   . Anemia of chronic disease 01/10/2019    Priority: High    12/2018 labs   . CKD (chronic kidney disease) stage 3, GFR 30-59 ml/min 01/06/2019    Priority: High  . Chronic heart failure with preserved ejection fraction ([place]) 09/05/2018    Priority: High  . H/O: upper GI bleed 09/05/2018    Priority: High    11/2017, Peptic ulcer by EGD, Dr. Carlean Purl; hospitalized   . Type 2 diabetes mellitus ([place]) 07/26/2018    Priority: High  . Malignant hypertension 07/26/2018    Priority: High  . Pulmonary hypertension ([place]) 08/27/2015    Priority: High  . Hyperlipidemia 01/23/2011    Priority: High  . Osteopenia 04/28/2019    Priority: Medium    dexa 04/2019 T = - 2.3 L/spine lowest. Recheck 2 years.    . Iron deficiency anemia 08/22/2018    Priority: Medium    Due to peptic ulcer/UGI bleed hospitalized 11/2017; EGD by Dr. Carlean Purl at that time.    Marland Kitchen Hearing loss 08/27/2015    Priority: Medium  . Asthma 08/27/2015    Priority: Medium  . Primary osteoarthritis of right knee 01/29/2020  . Leg edema 12/08/2018  . Acute on chronic diastolic CHF (congestive heart failure) ([place]) 07/31/2018  . Hyponatremia 11/20/2017    Social History: Patient  reports that she quit smoking about 46 years ago. Her smoking use included cigarettes. She has a 18.00 pack-year smoking history. She has never used smokeless tobacco. She reports that she does not drink alcohol and does not use drugs.  Review of  Systems: Ophthalmic: negative for eye pain, loss of vision or double vision Cardiovascular: negative for chest pain Respiratory: negative for SOB or persistent cough Gastrointestinal: negative for abdominal pain Genitourinary: negative for dysuria or gross hematuria MSK: negative for foot lesions Neurologic: negative for weakness or gait disturbance  Objective  Vitals: BP 124/70   Ht 5\' 6"  (1.676 m)   Wt 154 lb 9.6 oz (70.1 kg)   BMI 24.95 kg/m  General: well appearing, no acute distress  Psych:  Alert and oriented, normal mood and affect Cardiovascular:  Nl S1 and S2, RRR without murmur, gallop or rub.  Trace pedal edema bilaterally Respiratory: Distant breath sounds bilaterally, CTAB with normal effort, no rales Neurologic:   Mental status is normal.  normal gait Foot exam: no erythema, pallor, or cyanosis visible nl proprioception and sensation to monofilament testing bilaterally, +1 distal pulses bilaterally    Diabetic education: ongoing education regarding chronic disease management for diabetes was given today. We continue to reinforce the ABC's of diabetic management: A1c (<7 or 8 dependent upon patient), tight blood pressure control, and cholesterol management with goal LDL < 100 minimally. We discuss diet strategies, exercise recommendations, medication options and possible side effects. At each visit, we review recommended immunizations and preventive care recommendations for diabetics and stress that good diabetic control can prevent other problems. See below for this patient's data.    Commons side effects, risks, benefits, and alternatives for medications and treatment plan prescribed today were discussed, and the patient expressed understanding of the given instructions. Patient is instructed to call or message via MyChart if he/she has any questions or concerns regarding our treatment plan. No barriers to understanding were identified. We discussed Red Flag symptoms and  signs in detail. Patient expressed understanding regarding what to do in case of urgent or emergency type symptoms.   Medication list was reconciled, printed and provided to the patient in AVS. Patient instructions and summary information was reviewed with the patient as documented in the AVS. This note was prepared with assistance of Dragon voice recognition software. Occasional wrong-word or sound-a-like substitutions may have occurred due to the inherent limitations of voice recognition software  This visit occurred during the SARS-CoV-2 public health emergency.  Safety protocols were in place, including screening questions prior to the visit, additional usage of staff PPE, and extensive cleaning of exam room while observing appropriate contact time as indicated for disinfecting solutions.

## 2020-02-03 ENCOUNTER — Other Ambulatory Visit: Payer: Self-pay | Admitting: Family Medicine

## 2020-02-03 DIAGNOSIS — E785 Hyperlipidemia, unspecified: Secondary | ICD-10-CM

## 2020-03-05 ENCOUNTER — Telehealth: Payer: Self-pay

## 2020-03-05 NOTE — Telephone Encounter (Signed)
[facility] mail order pharmacy is requesting an alternative for pt's medication carvedilol 25 mg tablet. Pharmacy states that pt's insurance will not cover this medication and that the medication needs a prior auth done or changed to a different medication, Metoprolol tartrate 100 mg, Atenolol 100 mg,Bisoplol fum 10 mg. Order# [id number] Ph# [phone]. Please address. Thanks

## 2020-03-06 NOTE — Telephone Encounter (Signed)
**Note De-Identified  Obfuscation** I called [facility] pharmacy and s/w Trinh who re-ran the pts Carvedilol refill and it went through without needing a PA. They are filling and will ship the pts Carvedilol today.

## 2020-03-06 NOTE — Telephone Encounter (Signed)
**Note De-Identified  Obfuscation** I have started a Carvedilol PA through covermymeds and received the following message: Key: [id number] Mickle Asper  Key: [id number]  - PA Case ID: [id number] Need help? Call us at [phone]  Outcome This medication or product is on your plan's list of covered drugs. Prior authorization is not required at this time. If your pharmacy has questions regarding the processing of your prescription, please have them call the [facility] pharmacy help desk at [phone].  DrugCarvedilol 25MG  tablets FormOptumRx Medicare Part D Electronic Prior Authorization Form [phone] NCP[place])

## 2020-03-21 ENCOUNTER — Other Ambulatory Visit: Payer: Self-pay | Admitting: Cardiovascular Disease

## 2020-04-18 ENCOUNTER — Other Ambulatory Visit: Payer: Self-pay | Admitting: Family Medicine

## 2020-04-26 ENCOUNTER — Telehealth: Payer: Self-pay | Admitting: Oncology

## 2020-04-26 NOTE — Telephone Encounter (Signed)
Per 10/8 sch msg, patient wants to cancel 10/12 appt. Called and spoke with patient. Cancelled appt upon request. Did not want to reschedule at this time

## 2020-04-30 ENCOUNTER — Other Ambulatory Visit: Payer: Medicare Other

## 2020-04-30 ENCOUNTER — Ambulatory Visit: Payer: Medicare Other | Admitting: Oncology

## 2020-06-05 ENCOUNTER — Other Ambulatory Visit: Payer: Self-pay

## 2020-06-05 ENCOUNTER — Ambulatory Visit (INDEPENDENT_AMBULATORY_CARE_PROVIDER_SITE_OTHER): Payer: Medicare Other | Admitting: Family Medicine

## 2020-06-05 ENCOUNTER — Encounter: Payer: Self-pay | Admitting: Family Medicine

## 2020-06-05 VITALS — BP 156/70 | HR 76 | Temp 98.2°F | Ht 66.0 in | Wt 151.8 lb

## 2020-06-05 DIAGNOSIS — I272 Pulmonary hypertension, unspecified: Secondary | ICD-10-CM

## 2020-06-05 DIAGNOSIS — E1159 Type 2 diabetes mellitus with other circulatory complications: Secondary | ICD-10-CM

## 2020-06-05 DIAGNOSIS — I110 Hypertensive heart disease with heart failure: Secondary | ICD-10-CM

## 2020-06-05 DIAGNOSIS — E1121 Type 2 diabetes mellitus with diabetic nephropathy: Secondary | ICD-10-CM | POA: Diagnosis not present

## 2020-06-05 DIAGNOSIS — Z0000 Encounter for general adult medical examination without abnormal findings: Secondary | ICD-10-CM

## 2020-06-05 DIAGNOSIS — E119 Type 2 diabetes mellitus without complications: Secondary | ICD-10-CM | POA: Diagnosis not present

## 2020-06-05 DIAGNOSIS — K760 Fatty (change of) liver, not elsewhere classified: Secondary | ICD-10-CM

## 2020-06-05 DIAGNOSIS — I152 Hypertension secondary to endocrine disorders: Secondary | ICD-10-CM

## 2020-06-05 DIAGNOSIS — N1831 Chronic kidney disease, stage 3a: Secondary | ICD-10-CM | POA: Diagnosis not present

## 2020-06-05 DIAGNOSIS — E785 Hyperlipidemia, unspecified: Secondary | ICD-10-CM

## 2020-06-05 DIAGNOSIS — Z23 Encounter for immunization: Secondary | ICD-10-CM

## 2020-06-05 DIAGNOSIS — I10 Essential (primary) hypertension: Secondary | ICD-10-CM

## 2020-06-05 DIAGNOSIS — Z170 Estrogen receptor positive status [ER+]: Secondary | ICD-10-CM

## 2020-06-05 DIAGNOSIS — D638 Anemia in other chronic diseases classified elsewhere: Secondary | ICD-10-CM | POA: Diagnosis not present

## 2020-06-05 DIAGNOSIS — C50411 Malignant neoplasm of upper-outer quadrant of right female breast: Secondary | ICD-10-CM

## 2020-06-05 DIAGNOSIS — J4520 Mild intermittent asthma, uncomplicated: Secondary | ICD-10-CM

## 2020-06-05 MED ORDER — PREDNISONE 20 MG PO TABS
ORAL_TABLET | ORAL | 0 refills | Status: DC
Start: 2020-06-05 — End: 2020-07-26

## 2020-06-05 MED ORDER — PREDNISONE 20 MG PO TABS: ORAL_TABLET | ORAL | 0 refills | Status: DC

## 2020-06-05 NOTE — Progress Notes (Signed)
Subjective  Chief Complaint  Patient presents with  . Annual Exam    fasting  . Health Maintenance    flu shot given in office today    HPI: Carla Foster is a 79 y.o. female who presents to [facility] today for a Female Wellness Visit. She also has the concerns and/or needs as listed above in the chief complaint. These will be addressed in addition to the Health Maintenance Visit.   Wellness Visit: annual visit with health maintenance review and exam without Pap   HM: Doing well overall.  Lives with granddaughter.  No new concerns. Chronic disease f/u and/or acute problem visit: (deemed necessary to be done in addition to the wellness visit):  Breast cancer: Per oncology  Hypertension hyperlipidemia per cardiology.  Reports good control.  Mildly elevated blood pressure today.  No chest pain.  Complains of mild allergy/asthma exacerbation.  Ran out of albuterol.  Too expensive to buy at this time.  No history of COPD.  No chest pain.  No fever or productive cough.  Diabetes has been controlled.  She denies symptoms of hyperglycemia.  She tolerates Metformin.  No foot concerns  Chronic kidney disease: Has been stable.  Due for recheck.  No shortness of breath or lower extremity edema.  No nausea.  Reviewed imaging studies.  Do not see a renal ultrasound.  Assessment  1. Annual physical exam   2. Hypertensive heart disease with heart failure (HCC)   3. Stage 3a chronic kidney disease ([place])   4. Anemia of chronic disease   5. Malignant hypertension   6. Type 2 diabetes mellitus with diabetic nephropathy, without long-term current use of insulin ([place])   7. Pulmonary hypertension ([place])   8. Hyperlipidemia, unspecified hyperlipidemia type   9. Hepatic steatosis   10. Mild intermittent asthma without complication   11. Type 2 diabetes mellitus without complication, unspecified whether long term insulin use ([place])   12. Hypertension associated with  diabetes ([place])   13. Malignant neoplasm of upper-outer quadrant of right breast in female, estrogen receptor positive [facility])      Plan  Female Wellness Visit:  Age appropriate Health Maintenance and Prevention measures were discussed with patient. Included topics are cancer screening recommendations, ways to keep healthy (see AVS) including dietary and exercise recommendations, regular eye and dental care, use of seat belts, and avoidance of moderate alcohol use and tobacco use.   BMI: discussed patient's BMI and encouraged positive lifestyle modifications to help get to or maintain a target BMI.  HM needs and immunizations were addressed and ordered. See below for orders. See HM and immunization section for updates.  Routine labs and screening tests ordered including cmp, cbc and lipids where appropriate.  Discussed recommendations regarding Vit D and calcium supplementation (see AVS)  Chronic disease management visit and/or acute problem visit:  Hypertension pulmonary hypertension followed by pulmonology and cardiology.  Borderline control today but has been well controlled.  No changes in medication today.  Recheck 3 months.  Type 2 diabetes: Recheck A1c today.  Has been well controlled.  Well controlled clinically.  Hyperlipidemia on statin.  Recheck.  Chronic kidney disease: Asymptomatic.  Recheck.  Has chronic kidney anemia: History of iron deficiency anemia due to blood loss.  Will recheck today.  Renal ultrasound and/or renal consult if persists or progresses.  Patient aware.  Possible mild allergy/asthma exacerbation: Prednisone burst start Zantac try to find albuterol for cheaper price.  Gave  multiple options.  Follow up: 3 months for follow-up blood pressure and diabetes Orders Placed This Encounter  Procedures  . Iron, TIBC and Ferritin Panel  . PTH, Intact and Calcium  CBC, CMP, TSH, lipids Meds ordered this encounter  Medications  . predniSONE (DELTASONE) 20 MG  tablet    Sig: Take 2 tabs daily for 5 days    Dispense:  10 tablet    Refill:  0      Lifestyle: Body mass index is 24.5 kg/m. Wt Readings from Last 3 Encounters:  06/05/20 151 lb 12.8 oz (68.9 kg)  01/29/20 154 lb 9.6 oz (70.1 kg)  10/27/19 153 lb 3.5 oz (69.5 kg)     Patient Active Problem List   Diagnosis Date Noted  . Hypertensive heart disease with heart failure ([place]) 09/12/2019    Priority: High  . Malignant neoplasm of upper-outer quadrant of right breast in female, estrogen receptor positive ([place]) 06/05/2019    Priority: High  . PAD (peripheral artery disease) ([place]) - asymptomatic 04/27/2019    Priority: High    Quantiflo ABI's by in home nurse assessment: Bilateral abnl: betweein 0.6 and 0.7 Can't tolerate aspirin   . Anemia of chronic disease 01/10/2019    Priority: High    12/2018 labs   . CKD (chronic kidney disease) stage 3, GFR 30-59 ml/min (HCC) 01/06/2019    Priority: High  . Chronic heart failure with preserved ejection fraction ([place]) 09/05/2018    Priority: High  . H/O: upper GI bleed 09/05/2018    Priority: High    11/2017, Peptic ulcer by EGD, Dr. Carlean Purl; hospitalized   . Type 2 diabetes mellitus ([place]) 07/26/2018    Priority: High  . Malignant hypertension 07/26/2018    Priority: High  . Pulmonary hypertension ([place]) 08/27/2015    Priority: High  . Hyperlipidemia 01/23/2011    Priority: High  . Hepatic steatosis 06/05/2020    Priority: Medium  . Primary osteoarthritis of right knee 01/29/2020    Priority: Medium  . Osteopenia 04/28/2019    Priority: Medium    dexa 04/2019 T = - 2.3 L/spine lowest. Recheck 2 years.    . Leg edema 12/08/2018    Priority: Medium  . Iron deficiency anemia 08/22/2018    Priority: Medium    Due to peptic ulcer/UGI bleed hospitalized 11/2017; EGD by Dr. Carlean Purl at that time.    . Acute on chronic diastolic CHF (congestive heart failure) ([place]) 07/31/2018    Priority: Medium  . Hearing loss 08/27/2015     Priority: Medium  . Asthma 08/27/2015    Priority: Medium  . Hyponatremia 11/20/2017   Health Maintenance  Topic Date Due  . INFLUENZA VACCINE  02/18/2020  . COVID-19 Vaccine (1) 07/16/2020 (Originally 05/12/1953)  . OPHTHALMOLOGY EXAM  07/30/2020  . HEMOGLOBIN A1C  07/31/2020  . FOOT EXAM  01/28/2021  . DEXA SCAN  04/26/2021  . PNA vac Low Risk Adult  Completed  . TETANUS/TDAP  Discontinued   Immunization History  Administered Date(s) Administered  . Fluad Quad(high Dose 65+) 04/10/2019  . Influenza, High Dose Seasonal PF 04/20/2016, 04/20/2017, 04/15/2018, 06/20/2019  . Pneumococcal Conjugate-13 04/10/2019  . Pneumococcal Polysaccharide-23 05/09/2007  . Td 03/30/2007   We updated and reviewed the patient's past history in detail and it is documented below. Allergies: Patient is allergic to tiazac [diltiazem hcl]. Past Medical History Patient  has a past medical history of Acute blood loss anemia, AKI (acute kidney injury) ([place]), Asthma, CKD (chronic kidney disease)  stage 3, GFR 30-59 ml/min (HCC) (6/72/0947), Complication of anesthesia, Diabetes mellitus, Family history of upper GI bleeding (09/05/2018), GERD (gastroesophageal reflux disease), GI bleed, H/O: upper GI bleed (09/05/2018), Hematemesis, History of radiation therapy (08/07/19- 09/01/19), Hyperlipidemia, Hypertension, Melena, Osteopenia (04/28/2019), PAD (peripheral artery disease) ([place]) - asymptomatic (04/27/2019), and PONV (postoperative nausea and vomiting). Past Surgical History Patient  has a past surgical history that includes Partial hysterectomy; Cervical discectomy (08/23/00); Esophagogastroduodenoscopy (egd) with propofol (N/A, 11/21/2017); TEE without cardioversion (N/A, 08/01/2018); Breast lumpectomy with radioactive seed localization (Right, 07/10/2019); Bladder surgery; Breast surgery; Cataract extraction w/ intraocular lens implant (Left, 11/13/2015); Cataract extraction w/ intraocular lens implant (Right); and  Abdominal hysterectomy. Family History: Patient family history includes Asthma in her daughter; Atrial fibrillation in her mother; Cancer in her daughter; Emphysema in her father; Hypertension in her mother; Stroke in her mother. Social History:  Patient  reports that she quit smoking about 46 years ago. Her smoking use included cigarettes. She has a 18.00 pack-year smoking history. She has never used smokeless tobacco. She reports that she does not drink alcohol and does not use drugs.  Review of Systems: Constitutional: negative for fever or malaise Ophthalmic: negative for photophobia, double vision or loss of vision Cardiovascular: negative for chest pain, dyspnea on exertion, or new LE swelling Respiratory: negative for SOB or persistent cough, positive wheezing Gastrointestinal: negative for abdominal pain, change in bowel habits or melena Genitourinary: negative for dysuria or gross hematuria, no abnormal uterine bleeding or disharge Musculoskeletal: negative for new gait disturbance or muscular weakness Integumentary: negative for new or persistent rashes, no breast lumps Neurological: negative for TIA or stroke symptoms Psychiatric: negative for SI or delusions Allergic/Immunologic: negative for hives  Patient Care Team    Relationship Specialty Notifications Start End  Leamon Arnt, MD PCP - General Family Medicine  08/18/18   Nahser, Wonda Cheng, MD PCP - Cardiology Cardiology Admissions 12/08/18   Juanita Craver, MD Consulting Physician Gastroenterology  08/22/18   Mammography, [facility]  Diagnostic Radiology  08/22/18   Mauro Kaufmann, RN Oncology Nurse Navigator   06/02/19   Rockwell Germany, RN Oncology Nurse Navigator   06/02/19   Magrinat, Virgie Dad, MD Consulting Physician Oncology  06/07/19   Eppie Gibson, MD Consulting Physician Radiation Oncology  06/07/19   Rutherford Guys, MD Consulting Physician Ophthalmology  06/07/19   Vicie Mutters, MD Consulting Physician Otolaryngology   06/07/19     Objective  Vitals: BP (!) 156/70   Pulse 76   Temp 98.2 F (36.8 C) (Temporal)   Ht 5\' 6"  (1.676 m)   Wt 151 lb 12.8 oz (68.9 kg)   SpO2 98%   BMI 24.50 kg/m  General:  Well developed, well nourished, no acute distress  Psych:  Alert and orientedx3,normal mood and affect HEENT:  Normocephalic, atraumatic, non-icteric sclera,  supple neck without adenopathy, mass or thyromegaly Cardiovascular:  Normal S1, S2, RRR without gallop, rub or murmur Respiratory:  Good breath sounds bilaterally, bilateral expiratory wheeze present no rales Gastrointestinal: normal bowel sounds, soft, non-tender, no noted masses. No HSM MSK: no deformities, contusions. Joints are without erythema or swelling.  Skin:  Warm, no rashes or suspicious lesions noted Neurologic:    Mental status is normal. CN 2-11 are normal. Gross motor and sensory exams are normal. Normal gait.  Mild tremor .    Commons side effects, risks, benefits, and alternatives for medications and treatment plan prescribed today were discussed, and the patient expressed understanding of the given  instructions. Patient is instructed to call or message via MyChart if he/she has any questions or concerns regarding our treatment plan. No barriers to understanding were identified. We discussed Red Flag symptoms and signs in detail. Patient expressed understanding regarding what to do in case of urgent or emergency type symptoms.   Medication list was reconciled, printed and provided to the patient in AVS. Patient instructions and summary information was reviewed with the patient as documented in the AVS. This note was prepared with assistance of Dragon voice recognition software. Occasional wrong-word or sound-a-like substitutions may have occurred due to the inherent limitations of voice recognition software  This visit occurred during the SARS-CoV-2 public health emergency.  Safety protocols were in place, including screening questions  prior to the visit, additional usage of staff PPE, and extensive cleaning of exam room while observing appropriate contact time as indicated for disinfecting solutions.

## 2020-06-05 NOTE — Patient Instructions (Signed)
Please return in 3 months for diabetes follow up   If you have any questions or concerns, please don't hesitate to send me a message via MyChart or call the office at [phone]. Thank you for visiting with [place] today! It's our pleasure caring for you.  I have ordered 5 days worth of prednisone to help with your wheezing.  Start nightly zyrtec as well.   I will release your lab results to you on your MyChart account with further instructions. Please reply with any questions.   I may send you for a kidney ultrasound and/or kidney doctor in the future.

## 2020-06-05 NOTE — Addendum Note (Signed)
Addended by: Mliss Sax on: 06/05/2020 10:00 AM   Modules accepted: Orders

## 2020-06-06 LAB — IRON,TIBC AND FERRITIN PANEL
%SAT: 21 % (calc) (ref 16–45)
Ferritin: 92 ng/mL (ref 16–288)
Iron: 87 ug/dL (ref 45–160)
TIBC: 405 mcg/dL (calc) (ref 250–450)

## 2020-06-06 LAB — CBC WITH DIFFERENTIAL/PLATELET
Absolute Monocytes: 732 {cells}/uL (ref 200–950)
Basophils Absolute: 62 {cells}/uL (ref 0–200)
Basophils Relative: 0.8 %
Eosinophils Absolute: 31 {cells}/uL (ref 15–500)
Eosinophils Relative: 0.4 %
HCT: 29.8 % — ABNORMAL LOW (ref 35.0–45.0)
Hemoglobin: 9.8 g/dL — ABNORMAL LOW (ref 11.7–15.5)
Lymphs Abs: 993 {cells}/uL (ref 850–3900)
MCH: 29.6 pg (ref 27.0–33.0)
MCHC: 32.9 g/dL (ref 32.0–36.0)
MCV: 90 fL (ref 80.0–100.0)
MPV: 9.5 fL (ref 7.5–12.5)
Monocytes Relative: 9.5 %
Neutro Abs: 5883 {cells}/uL (ref 1500–7800)
Neutrophils Relative %: 76.4 %
Platelets: 284 Thousand/uL (ref 140–400)
RBC: 3.31 Million/uL — ABNORMAL LOW (ref 3.80–5.10)
RDW: 12.1 % (ref 11.0–15.0)
Total Lymphocyte: 12.9 %
WBC: 7.7 Thousand/uL (ref 3.8–10.8)

## 2020-06-06 LAB — HEMOGLOBIN A1C
Hgb A1c MFr Bld: 6.5 %{Hb} — ABNORMAL HIGH
Mean Plasma Glucose: 140 (calc)
eAG (mmol/L): 7.7 (calc)

## 2020-06-06 LAB — COMPLETE METABOLIC PANEL WITHOUT GFR
AG Ratio: 1.6 (calc) (ref 1.0–2.5)
ALT: 10 U/L (ref 6–29)
AST: 18 U/L (ref 10–35)
Albumin: 3.9 g/dL (ref 3.6–5.1)
Alkaline phosphatase (APISO): 23 U/L — ABNORMAL LOW (ref 37–153)
BUN/Creatinine Ratio: 27 (calc) — ABNORMAL HIGH (ref 6–22)
BUN: 35 mg/dL — ABNORMAL HIGH (ref 7–25)
CO2: 26 mmol/L (ref 20–32)
Calcium: 10 mg/dL (ref 8.6–10.4)
Chloride: 91 mmol/L — ABNORMAL LOW (ref 98–110)
Creat: 1.28 mg/dL — ABNORMAL HIGH (ref 0.60–0.88)
GFR, Est African American: 45 mL/min/1.73m2 — ABNORMAL LOW
GFR, Est Non African American: 39 mL/min/1.73m2 — ABNORMAL LOW
Globulin: 2.5 g/dL (ref 1.9–3.7)
Glucose, Bld: 151 mg/dL — ABNORMAL HIGH (ref 65–99)
Potassium: 5 mmol/L (ref 3.5–5.3)
Sodium: 127 mmol/L — ABNORMAL LOW (ref 135–146)
Total Bilirubin: 0.4 mg/dL (ref 0.2–1.2)
Total Protein: 6.4 g/dL (ref 6.1–8.1)

## 2020-06-06 LAB — LIPID PANEL
Cholesterol: 138 mg/dL
HDL: 59 mg/dL
LDL Cholesterol (Calc): 59 mg/dL
Non-HDL Cholesterol (Calc): 79 mg/dL
Total CHOL/HDL Ratio: 2.3 (calc)
Triglycerides: 114 mg/dL

## 2020-06-06 LAB — TSH: TSH: 2.91 mIU/L (ref 0.40–4.50)

## 2020-06-06 LAB — PTH, INTACT AND CALCIUM
Calcium: 10 mg/dL (ref 8.6–10.4)
PTH: 17 pg/mL (ref 14–64)

## 2020-06-18 ENCOUNTER — Other Ambulatory Visit: Payer: Self-pay

## 2020-06-18 DIAGNOSIS — E871 Hypo-osmolality and hyponatremia: Secondary | ICD-10-CM

## 2020-06-20 ENCOUNTER — Other Ambulatory Visit: Payer: Medicare Other

## 2020-06-20 ENCOUNTER — Other Ambulatory Visit: Payer: Self-pay

## 2020-06-20 DIAGNOSIS — E871 Hypo-osmolality and hyponatremia: Secondary | ICD-10-CM

## 2020-06-21 LAB — BASIC METABOLIC PANEL
BUN/Creatinine Ratio: 31 (calc) — ABNORMAL HIGH (ref 6–22)
BUN: 39 mg/dL — ABNORMAL HIGH (ref 7–25)
CO2: 23 mmol/L (ref 20–32)
Calcium: 9 mg/dL (ref 8.6–10.4)
Chloride: 100 mmol/L (ref 98–110)
Creat: 1.26 mg/dL — ABNORMAL HIGH (ref 0.60–0.88)
Glucose, Bld: 220 mg/dL — ABNORMAL HIGH (ref 65–99)
Potassium: 4.7 mmol/L (ref 3.5–5.3)
Sodium: 133 mmol/L — ABNORMAL LOW (ref 135–146)

## 2020-06-26 NOTE — Progress Notes (Signed)
Please call patient: I have reviewed his/her lab results. Sodium level is better, still just below normal and likley due to medications.  Avoid alcohol and limit water intake to < 2ltr per day.  I will recheck at next visit.  thanks

## 2020-06-28 ENCOUNTER — Other Ambulatory Visit: Payer: Self-pay

## 2020-06-28 MED ORDER — AMOXICILLIN 875 MG PO TABS: 875.0000 mg | ORAL_TABLET | Freq: Two times a day (BID) | ORAL | 0 refills | Status: DC

## 2020-07-09 ENCOUNTER — Other Ambulatory Visit: Payer: Self-pay | Admitting: Cardiovascular Disease

## 2020-07-23 ENCOUNTER — Telehealth: Payer: Self-pay

## 2020-07-23 NOTE — Telephone Encounter (Signed)
.   LAST APPOINTMENT DATE: 06/05/2020   NEXT APPOINTMENT DATE:@2 /15/2022  MEDICATION:albuterol (VENTOLIN HFA) 108 (90 Base) MCG/ACT inhaler    furosemide (LASIX) 40 MG tablet  PHARMACY:[id number] - [place], [place] - [address] [place] [address] [place] [address]    CLINICAL FILLS OUT ALL BELOW:   LAST REFILL:  QTY:  REFILL DATE:    OTHER COMMENTS:    Okay for refill?  Please advise

## 2020-07-24 ENCOUNTER — Other Ambulatory Visit: Payer: Self-pay

## 2020-07-24 MED ORDER — ALBUTEROL SULFATE HFA 108 (90 BASE) MCG/ACT IN AERS: 2.0000 | INHALATION_SPRAY | Freq: Four times a day (QID) | RESPIRATORY_TRACT | 3 refills | Status: DC | PRN

## 2020-07-24 MED ORDER — FUROSEMIDE 40 MG PO TABS: ORAL_TABLET | ORAL | 3 refills | Status: DC

## 2020-07-24 NOTE — Telephone Encounter (Signed)
Refills sent

## 2020-07-26 ENCOUNTER — Encounter: Payer: Self-pay | Admitting: Family Medicine

## 2020-07-26 ENCOUNTER — Other Ambulatory Visit: Payer: Self-pay

## 2020-07-26 ENCOUNTER — Ambulatory Visit (INDEPENDENT_AMBULATORY_CARE_PROVIDER_SITE_OTHER): Payer: Medicare Other | Admitting: Family Medicine

## 2020-07-26 VITALS — BP 134/60 | HR 82 | Temp 97.8°F | Resp 18 | Ht 66.0 in | Wt 158.4 lb

## 2020-07-26 DIAGNOSIS — I110 Hypertensive heart disease with heart failure: Secondary | ICD-10-CM | POA: Diagnosis not present

## 2020-07-26 DIAGNOSIS — I272 Pulmonary hypertension, unspecified: Secondary | ICD-10-CM

## 2020-07-26 DIAGNOSIS — I5033 Acute on chronic diastolic (congestive) heart failure: Secondary | ICD-10-CM | POA: Diagnosis not present

## 2020-07-26 DIAGNOSIS — E871 Hypo-osmolality and hyponatremia: Secondary | ICD-10-CM | POA: Diagnosis not present

## 2020-07-26 DIAGNOSIS — R600 Localized edema: Secondary | ICD-10-CM

## 2020-07-26 DIAGNOSIS — N1831 Chronic kidney disease, stage 3a: Secondary | ICD-10-CM

## 2020-07-26 DIAGNOSIS — J4530 Mild persistent asthma, uncomplicated: Secondary | ICD-10-CM | POA: Diagnosis not present

## 2020-07-26 LAB — COMPREHENSIVE METABOLIC PANEL
ALT: 8 U/L (ref 0–35)
AST: 20 U/L (ref 0–37)
Albumin: 3.9 g/dL (ref 3.5–5.2)
Alkaline Phosphatase: 23 U/L — ABNORMAL LOW (ref 39–117)
BUN: 27 mg/dL — ABNORMAL HIGH (ref 6–23)
CO2: 29 mEq/L (ref 19–32)
Calcium: 9.9 mg/dL (ref 8.4–10.5)
Chloride: 96 mEq/L (ref 96–112)
Creatinine, Ser: 1.19 mg/dL (ref 0.40–1.20)
GFR: 42.97 mL/min — ABNORMAL LOW (ref >60.00)
Glucose, Bld: 122 mg/dL — ABNORMAL HIGH (ref 70–99)
Potassium: 4 mEq/L (ref 3.5–5.1)
Sodium: 132 mEq/L — ABNORMAL LOW (ref 135–145)
Total Bilirubin: 0.5 mg/dL (ref 0.2–1.2)
Total Protein: 6.9 g/dL (ref 6.0–8.3)

## 2020-07-26 LAB — BRAIN NATRIURETIC PEPTIDE: Pro B Natriuretic peptide (BNP): 225 pg/mL — ABNORMAL HIGH (ref 0.0–100.0)

## 2020-07-26 MED ORDER — BUDESONIDE-FORMOTEROL FUMARATE 80-4.5 MCG/ACT IN AERO: 2.0000 | INHALATION_SPRAY | Freq: Two times a day (BID) | RESPIRATORY_TRACT | 3 refills | Status: DC

## 2020-07-26 NOTE — Patient Instructions (Addendum)
Please return in 1-2 weeks to recheck heart failure  Please increase the lasix to twice daily for 1 week, then return back to once a day.  Limit fluid intake to < 1.5 ltr / day.  Avoid salty foods: chips, sausage, pork, bacon, ham, fast foods, canned veggies and canned soups.  Use compression stockings to help manage the leg swelling.  I've ordered a new inhaler for you to start twice a day for your breathing.   If you have any questions or concerns, please don't hesitate to send me a message via MyChart or call the office at [phone]. Thank you for visiting with Carla Foster today! It's our pleasure caring for you.   Heart Failure Eating Plan Heart failure, also called congestive heart failure, occurs when your heart does not pump blood well enough to meet your body's needs for oxygen-rich blood. Heart failure is a long-term (chronic) condition. Living with heart failure can be challenging. However, following your health care provider's instructions about a healthy lifestyle and working with a diet and nutrition specialist (dietitian) to choose the right foods may help to improve your symptoms. What are tips for following this plan? Reading food labels  Check food labels for the amount of sodium per serving. Choose foods that have less than 140 mg (milligrams) of sodium in each serving.  Check food labels for the number of calories per serving. This is important if you need to limit your daily calorie intake to lose weight.  Check food labels for the serving size. If you eat more than one serving, you will be eating more sodium and calories than what is listed on the label.  Look for foods that are labeled as "sodium-free," "very low sodium," or "low sodium." ? Foods labeled as "reduced sodium" or "lightly salted" may still have more sodium than what is recommended for you. Cooking  Avoid adding salt when cooking. Ask your health care provider or dietitian before using salt  substitutes.  Season food with salt-free seasonings, spices, or herbs. Check the label of seasoning mixes to make sure they do not contain salt.  Cook with heart-healthy oils, such as olive, canola, soybean, or sunflower oil.  Do not fry foods. Cook foods using low-fat methods, such as baking, boiling, grilling, and broiling.  Limit unhealthy fats when cooking by: ? Removing the skin from poultry, such as chicken. ? Removing all visible fats from meats. ? Skimming the fat off from stews, soups, and gravies before serving them. Meal planning   Limit your intake of: ? Processed, canned, or pre-packaged foods. ? Foods that are high in trans fat, such as fried foods. ? Sweets, desserts, sugary drinks, and other foods with added sugar. ? Full-fat dairy products, such as whole milk.  Eat a balanced diet that includes: ? 4-5 servings of fruit each day and 4-5 servings of vegetables each day. At each meal, try to fill half of your plate with fruits and vegetables. ? Up to 6-8 servings of whole grains each day. ? Up to 2 servings of lean meat, poultry, or fish each day. One serving of meat is equal to 3 oz. This is about the same size as a deck of cards. ? 2 servings of low-fat dairy each day. ? Heart-healthy fats. Healthy fats called omega-3 fatty acids are found in foods such as flaxseed and cold-water fish like sardines, salmon, and mackerel.  Aim to eat 25-35 g (grams) of fiber a day. Foods that are high in fiber include  apples, broccoli, carrots, beans, peas, and whole grains.  Do not add salt or condiments that contain salt (such as soy sauce) to foods before eating.  When eating at a restaurant, ask that your food be prepared with less salt or no salt, if possible.  Try to eat 2 or more vegetarian meals each week.  Eat more home-cooked food and eat less restaurant, buffet, and fast food. General information  Do not eat more than 2,300 mg of salt (sodium) a day. The amount of  sodium that is recommended for you may be lower, depending on your condition.  Maintain a healthy body weight as directed. Ask your health care provider what a healthy weight is for you. ? Check your weight every day. ? Work with your health care provider and dietitian to make a plan that is right for you to lose weight or maintain your current weight.  Limit how much fluid you drink. Ask your health care provider or dietitian how much fluid you can have each day.  Limit or avoid alcohol as told by your health care provider or dietitian. Recommended foods The items listed may not be a complete list. Talk with your dietitian about what dietary choices are best for you. Fruits All fresh, frozen, and canned fruits. Dried fruits, such as raisins, prunes, and cranberries. Vegetables All fresh vegetables. Vegetables that are frozen without sauce or added salt. Low-sodium or sodium-free canned vegetables. Grains Bread with less than 80 mg of sodium per slice. Whole-wheat pasta, quinoa, and brown rice. Oats and oatmeal. Barley. [place]. Grits and cream of wheat. Whole-grain and whole-wheat cold cereal. Meats and other protein foods Lean cuts of meat. Skinless chicken and [place]. Fish with high omega-3 fatty acids, such as salmon, sardines, and other cold-water fishes. Eggs. Dried beans, peas, and edamame. Unsalted nuts and nut butters. Dairy Low-fat or nonfat (skim) milk and dried milk. Rice milk, soy milk, and almond milk. Low-fat or nonfat yogurt. Small amounts of reduced-sodium block cheese. Low-sodium cottage cheese. Fats and oils Olive, canola, soybean, flaxseed, or sunflower oil. Avocado. Sweets and desserts Apple sauce. Granola bars. Sugar-free pudding and gelatin. Frozen fruit bars. Seasoning and other foods Fresh and dried herbs. Lemon or lime juice. Vinegar. Low-sodium ketchup. Salt-free marinades, salad dressings, sauces, and seasonings. The items listed above may not be a complete list  of foods and beverages you can eat. Contact a dietitian for more information. Foods to avoid The items listed may not be a complete list. Talk with your dietitian about what dietary choices are best for you. Fruits Fruits that are dried with sodium-containing preservatives. Vegetables Canned vegetables. Frozen vegetables with sauce or seasonings. Creamed vegetables. [place] fries. Onion rings. Pickled vegetables and sauerkraut. Grains Bread with more than 80 mg of sodium per slice. Hot or cold cereal with more than 140 mg sodium per serving. Salted pretzels and crackers. Pre-packaged breadcrumbs. Bagels, croissants, and biscuits. Meats and other protein foods Ribs and chicken wings. Bacon, ham, pepperoni, bologna, salami, and packaged luncheon meats. Hot dogs, bratwurst, and sausage. Canned meat. Smoked meat and fish. Salted nuts and seeds. Dairy Whole milk, half-and-half, and cream. Buttermilk. Processed cheese, cheese spreads, and cheese curds. Regular cottage cheese. Feta cheese. Shredded cheese. String cheese. Fats and oils Butter, lard, shortening, ghee, and bacon fat. Canned and packaged gravies. Seasoning and other foods Onion salt, garlic salt, table salt, and sea salt. Marinades. Regular salad dressings. Relishes, pickles, and olives. Meat flavorings and tenderizers, and bouillon cubes. Horseradish, ketchup,  and mustard. Worcestershire sauce. Teriyaki sauce, soy sauce (including reduced sodium). Hot sauce and Tabasco sauce. Steak sauce, fish sauce, oyster sauce, and cocktail sauce. Taco seasonings. Barbecue sauce. Tartar sauce. The items listed above may not be a complete list of foods and beverages you should avoid. Contact a dietitian for more information. Summary  A heart failure eating plan includes changes that limit your intake of sodium and unhealthy fat, and it may help you lose weight or maintain a healthy weight. Your health care provider may also recommend limiting how much  fluid you drink.  Most people with heart failure should eat no more than 2,300 mg of salt (sodium) a day. The amount of sodium that is recommended for you may be lower, depending on your condition.  Contact your health care provider or dietitian before making any major changes to your diet. This information is not intended to replace advice given to you by your health care provider. Make sure you discuss any questions you have with your health care provider. Document Revised: 09/01/2018 Document Reviewed: 11/20/2016 Elsevier Patient Education  [place].

## 2020-07-26 NOTE — Progress Notes (Signed)
Subjective  CC:  Chief Complaint  Patient presents with  . Leg Swelling    Patient was standing all day on Christmas cooking. She noticed that she had swelling to her legs and ankles. She stated that she had to put on bigger shoes due to the swelling. Shooting pain in both legs. She exercises in the morning by walking around and when she sits down and her legs are elevated that's when the swelling happens.     HPI: Carla Foster is a 80 y.o. female who presents to the office today to address the problems listed above in the chief complaint.  80 year old with chronic diastolic heart failure, pulmonary hypertension, asthma and diabetes presents due to worsening lower extremity edema for the last 2 weeks.  Started around Christmas.  She does admit to changes in her diet including increased ham, sausage, potato chips and dip, and fast food.  She tends to drink a lot of water and 1 20 ounce diet soda every other day.  She denies chest pain, palpitations or orthopnea.  She does have shortness of breath.  Recently got her albuterol filled and noted that that does help her.  She is no longer on a maintenance inhaler for her asthma or pulmonary symptoms.  She denies fevers, chills, URI symptoms, cough.  She otherwise feels well.  Labs from last visit revealed mild hyponatremia.  She does have chronic kidney disease.   Assessment  1. Acute on chronic diastolic congestive heart failure ([place])   2. Hypertensive heart disease with heart failure (H[facility])   3. Stage 3a chronic kidney disease ([place])   4. Leg edema   5. Hyponatremia   6. Pulmonary hypertension ([place])   7. Mild persistent asthma without complication         Plan   Acute on chronic heart failure: Related to dietary changes and documented fluid restriction.  We will increase Lasix to twice daily 40 mg for the next week.  Follow-up in 1 to 2 weeks to recheck lab work.  Check baseline lab work and sodium levels today.  Pulmonary hypertension  asthma: He had Symbicort available.  Continue albuterol as needed.  Wheezing on exam today noted.  Bilateral lower extremity edema: As above.  Recommend compression stockings also  Follow up: Return in about 2 weeks (around 08/09/2020) for recheck.  09/03/2020  Orders Placed This Encounter  Procedures  . Comprehensive metabolic panel  . B Nat Peptide   Meds ordered this encounter  Medications  . budesonide-formoterol (SYMBICORT) 80-4.5 MCG/ACT inhaler    Sig: Inhale 2 puffs into the lungs 2 (two) times daily.    Dispense:  1 each    Refill:  3      I reviewed the patients updated PMH, FH, and SocHx.    Patient Active Problem List   Diagnosis Date Noted  . Hypertensive heart disease with heart failure ([place]) 09/12/2019    Priority: High  . Malignant neoplasm of upper-outer quadrant of right breast in female, estrogen receptor positive ([place]) 06/05/2019    Priority: High  . PAD (peripheral artery disease) ([place]) - asymptomatic 04/27/2019    Priority: High  . Anemia of chronic disease 01/10/2019    Priority: High  . CKD (chronic kidney disease) stage 3, GFR 30-59 ml/min (HCC) 01/06/2019    Priority: High  . Chronic heart failure with preserved ejection fraction ([place]) 09/05/2018    Priority: High  . H/O: upper GI bleed 09/05/2018    Priority: High  .  Type 2 diabetes mellitus (HCC) 07/26/2018    Priority: High  . Malignant hypertension 07/26/2018    Priority: High  . Pulmonary hypertension (HCC) 08/27/2015    Priority: High  . Hyperlipidemia 01/23/2011    Priority: High  . Hepatic steatosis 06/05/2020    Priority: Medium  . Primary osteoarthritis of right knee 01/29/2020    Priority: Medium  . Osteopenia 04/28/2019    Priority: Medium  . Leg edema 12/08/2018    Priority: Medium  . Iron deficiency anemia 08/22/2018    Priority: Medium  . Acute on chronic diastolic CHF (congestive heart failure) (HCC) 07/31/2018    Priority: Medium  . Hearing loss 08/27/2015     Priority: Medium  . Asthma 08/27/2015    Priority: Medium  . Hyponatremia 11/20/2017   Current Meds  Medication Sig  . albuterol (VENTOLIN HFA) 108 (90 Base) MCG/ACT inhaler Inhale 2 puffs into the lungs every 6 (six) hours as needed for wheezing or shortness of breath.  Marland Kitchen amLODipine (NORVASC) 10 MG tablet Take 1 tablet (10 mg total) by mouth daily.  . budesonide-formoterol (SYMBICORT) 80-4.5 MCG/ACT inhaler Inhale 2 puffs into the lungs 2 (two) times daily.  . Calcium Carbonate-Vitamin D (CALCIUM 600 + D PO) Take 1 tablet by mouth daily.  . carvedilol (COREG) 25 MG tablet Take 1 tablet (25 mg total) by mouth 2 (two) times daily.  . diclofenac Sodium (VOLTAREN) 1 % GEL Apply 4 g topically 4 (four) times daily as needed (right knee pain).  Marland Kitchen doxazosin (CARDURA) 4 MG tablet TAKE 1 TABLET BY MOUTH AT  BEDTIME  . fenofibrate 160 MG tablet TAKE 1 TABLET BY MOUTH  DAILY  . ferrous sulfate 325 (65 FE) MG tablet Take 1 tablet (325 mg total) by mouth daily.  . furosemide (LASIX) 40 MG tablet TAKE 1 TABLET BY MOUTH DAILY  . losartan (COZAAR) 100 MG tablet TAKE 1 TABLET BY MOUTH  DAILY  . metFORMIN (GLUCOPHAGE) 1000 MG tablet Take 1 tablet (1,000 mg total) by mouth 2 (two) times daily with a meal.  . Multiple Vitamin (MULTIVITAMIN) tablet Take 1 tablet by mouth daily.  . simvastatin (ZOCOR) 20 MG tablet TAKE 1 TABLET BY MOUTH AT  BEDTIME  . vitamin A 8000 UNIT capsule Take 8,000 Units by mouth daily.   . vitamin B-12 (CYANOCOBALAMIN) 500 MCG tablet Take 500 mcg by mouth daily.  . vitamin C (ASCORBIC ACID) 500 MG tablet Take 500 mg by mouth daily.    Allergies: Patient is allergic to tiazac [diltiazem hcl]. Family History: Patient family history includes Asthma in her daughter; Atrial fibrillation in her mother; Cancer in her daughter; Emphysema in her father; Hypertension in her mother; Stroke in her mother. Social History:  Patient  reports that she quit smoking about 46 years ago. Her smoking  use included cigarettes. She has a 18.00 pack-year smoking history. She has never used smokeless tobacco. She reports that she does not drink alcohol and does not use drugs.  Review of Systems: Constitutional: Negative for fever malaise or anorexia Cardiovascular: negative for chest pain Respiratory: negative for SOB or persistent cough Gastrointestinal: negative for abdominal pain  Objective  Vitals: BP 134/60   Pulse 82   Temp 97.8 F (36.6 C) (Temporal)   Resp 18   Ht 5\' 6"  (1.676 m)   Wt 158 lb 6.4 oz (71.8 kg)   SpO2 92%   BMI 25.57 kg/m  General: no acute distress , A&Ox3, no respiratory distress, speaking in full  sentences HEENT: PEERL, conjunctiva normal, neck is supple Cardiovascular:  RRR with systolic murmur no audible gallop Respiratory:  Good breath sounds bilaterally, bilateral expiratory wheezing present, no rales or rhonchi Skin:  Warm, no rashes Bilateral lower extremity: +3 pitting edema to knees, chronic venous insufficiency changes present     Commons side effects, risks, benefits, and alternatives for medications and treatment plan prescribed today were discussed, and the patient expressed understanding of the given instructions. Patient is instructed to call or message via MyChart if he/she has any questions or concerns regarding our treatment plan. No barriers to understanding were identified. We discussed Red Flag symptoms and signs in detail. Patient expressed understanding regarding what to do in case of urgent or emergency type symptoms.   Medication list was reconciled, printed and provided to the patient in AVS. Patient instructions and summary information was reviewed with the patient as documented in the AVS. This note was prepared with assistance of Dragon voice recognition software. Occasional wrong-word or sound-a-like substitutions may have occurred due to the inherent limitations of voice recognition software  This visit occurred during the  SARS-CoV-2 public health emergency.  Safety protocols were in place, including screening questions prior to the visit, additional usage of staff PPE, and extensive cleaning of exam room while observing appropriate contact time as indicated for disinfecting solutions.

## 2020-07-30 DIAGNOSIS — Z961 Presence of intraocular lens: Secondary | ICD-10-CM | POA: Diagnosis not present

## 2020-07-30 DIAGNOSIS — Z7984 Long term (current) use of oral hypoglycemic drugs: Secondary | ICD-10-CM | POA: Diagnosis not present

## 2020-07-30 DIAGNOSIS — E119 Type 2 diabetes mellitus without complications: Secondary | ICD-10-CM | POA: Diagnosis not present

## 2020-08-09 ENCOUNTER — Encounter: Payer: Self-pay | Admitting: Family Medicine

## 2020-08-09 ENCOUNTER — Ambulatory Visit (INDEPENDENT_AMBULATORY_CARE_PROVIDER_SITE_OTHER): Payer: Medicare Other | Admitting: Family Medicine

## 2020-08-09 ENCOUNTER — Other Ambulatory Visit: Payer: Self-pay

## 2020-08-09 VITALS — BP 150/80 | HR 87 | Temp 97.9°F | Ht 66.0 in | Wt 147.0 lb

## 2020-08-09 DIAGNOSIS — R600 Localized edema: Secondary | ICD-10-CM | POA: Diagnosis not present

## 2020-08-09 DIAGNOSIS — E871 Hypo-osmolality and hyponatremia: Secondary | ICD-10-CM | POA: Diagnosis not present

## 2020-08-09 DIAGNOSIS — I110 Hypertensive heart disease with heart failure: Secondary | ICD-10-CM | POA: Diagnosis not present

## 2020-08-09 DIAGNOSIS — I5033 Acute on chronic diastolic (congestive) heart failure: Secondary | ICD-10-CM | POA: Diagnosis not present

## 2020-08-09 MED ORDER — SHINGRIX 50 MCG/0.5ML IM SUSR: 0.5000 mL | Freq: Once | INTRAMUSCULAR | 0 refills | Status: AC

## 2020-08-09 NOTE — Patient Instructions (Signed)
Please follow up as scheduled for your next visit with me: 09/03/2020   Please return to taking your lasix 40mg  once a day.  I will call you with your lab results.  Please avoid salty foods!  Please start taking your weight every morning and writing it down. You are down 11 pounds since the last visit. I'd like to keep your weight between 145 and 149; if you are developing swelling again or your weight is increasing > 3 pounds, you may take the lasix twice a day for 1-3 days as needed.   Wt Readings from Last 3 Encounters:  08/09/20 147 lb (66.7 kg)  07/26/20 158 lb 6.4 oz (71.8 kg)  06/05/20 151 lb 12.8 oz (68.9 kg)    If you have any questions or concerns, please don't hesitate to send me a message via MyChart or call the office at [phone]. Thank you for visiting with Carla Foster today! It's our pleasure caring for you.

## 2020-08-09 NOTE — Progress Notes (Signed)
Subjective  CC:  Chief Complaint  Patient presents with  . Congestive Heart Failure    Follow up; increased Lasix to 40 mg twice daily. Pt says that leg swelling has decreased.     HPI: Carla Foster is a 80 y.o. female who presents to the office today to address the problems listed above in the chief complaint.  Has responded well to increase in diuretic. Is eating less salty foods again. Feels much better. No lightheadedness. She is down 10 pounds. No cp.  HM: eligible for shingrix   Wt Readings from Last 3 Encounters:  08/09/20 147 lb (66.7 kg)  07/26/20 158 lb 6.4 oz (71.8 kg)  06/05/20 151 lb 12.8 oz (68.9 kg)     Assessment  1. Acute on chronic diastolic congestive heart failure ([place])   2. Hyponatremia   3. Leg edema   4. Hypertensive heart disease with heart failure (HCC)      Plan   chf:  Improved with diuresis. Check labs for renal function. Can go back to once daily. Low soidum diet. Has f/u with cards. Consider adjusting amlodipine if edema persists or limited by renal function.   BP is elevated. No changes made today. Will monitor.   RX for shingrix given.   Follow up: as scheduled  09/03/2020  Orders Placed This Encounter  Procedures  . Basic metabolic panel   Meds ordered this encounter  Medications  . Zoster Vaccine Adjuvanted [facility]) injection    Sig: Inject 0.5 mLs into the muscle once for 1 dose. Please give 2nd dose 2-6 months after first dose    Dispense:  2 each    Refill:  0      I reviewed the patients updated PMH, FH, and SocHx.    Patient Active Problem List   Diagnosis Date Noted  . Hypertensive heart disease with heart failure ([place]) 09/12/2019    Priority: High  . History of right breast cancer 06/05/2019    Priority: High  . PAD (peripheral artery disease) ([place]) - asymptomatic 04/27/2019    Priority: High  . Anemia of chronic disease 01/10/2019    Priority: High  . CKD (chronic kidney disease) stage 3, GFR 30-59 ml/min  (HCC) 01/06/2019    Priority: High  . Chronic heart failure with preserved ejection fraction ([place]) 09/05/2018    Priority: High  . H/O: upper GI bleed 09/05/2018    Priority: High  . Type 2 diabetes mellitus ([place]) 07/26/2018    Priority: High  . Malignant hypertension 07/26/2018    Priority: High  . Pulmonary hypertension ([place]) 08/27/2015    Priority: High  . Hyperlipidemia 01/23/2011    Priority: High  . Hepatic steatosis 06/05/2020    Priority: Medium  . Primary osteoarthritis of right knee 01/29/2020    Priority: Medium  . Osteopenia 04/28/2019    Priority: Medium  . Leg edema 12/08/2018    Priority: Medium  . Iron deficiency anemia 08/22/2018    Priority: Medium  . Acute on chronic diastolic CHF (congestive heart failure) ([place]) 07/31/2018    Priority: Medium  . Hearing loss 08/27/2015    Priority: Medium  . Asthma 08/27/2015    Priority: Medium  . Hyponatremia 11/20/2017   Current Meds  Medication Sig  . albuterol (VENTOLIN HFA) 108 (90 Base) MCG/ACT inhaler Inhale 2 puffs into the lungs every 6 (six) hours as needed for wheezing or shortness of breath.  Marland Kitchen amLODipine (NORVASC) 10 MG tablet Take 1 tablet (10 mg  total) by mouth daily.  . budesonide-formoterol (SYMBICORT) 80-4.5 MCG/ACT inhaler Inhale 2 puffs into the lungs 2 (two) times daily.  . Calcium Carbonate-Vitamin D (CALCIUM 600 + D PO) Take 1 tablet by mouth daily.  . carvedilol (COREG) 25 MG tablet Take 1 tablet (25 mg total) by mouth 2 (two) times daily.  . diclofenac Sodium (VOLTAREN) 1 % GEL Apply 4 g topically 4 (four) times daily as needed (right knee pain).  Marland Kitchen doxazosin (CARDURA) 4 MG tablet TAKE 1 TABLET BY MOUTH AT  BEDTIME  . fenofibrate 160 MG tablet TAKE 1 TABLET BY MOUTH  DAILY  . ferrous sulfate 325 (65 FE) MG tablet Take 1 tablet (325 mg total) by mouth daily.  . furosemide (LASIX) 40 MG tablet TAKE 1 TABLET BY MOUTH DAILY  . losartan (COZAAR) 100 MG tablet TAKE 1 TABLET BY MOUTH  DAILY  .  metFORMIN (GLUCOPHAGE) 1000 MG tablet Take 1 tablet (1,000 mg total) by mouth 2 (two) times daily with a meal.  . Multiple Vitamin (MULTIVITAMIN) tablet Take 1 tablet by mouth daily.  . simvastatin (ZOCOR) 20 MG tablet TAKE 1 TABLET BY MOUTH AT  BEDTIME  . vitamin A 8000 UNIT capsule Take 8,000 Units by mouth daily.   . vitamin B-12 (CYANOCOBALAMIN) 500 MCG tablet Take 500 mcg by mouth daily.  . vitamin C (ASCORBIC ACID) 500 MG tablet Take 500 mg by mouth daily.  . [EXPIRED] Zoster Vaccine Adjuvanted [facility]) injection Inject 0.5 mLs into the muscle once for 1 dose. Please give 2nd dose 2-6 months after first dose    Allergies: Patient is allergic to tiazac [diltiazem hcl]. Family History: Patient family history includes Asthma in her daughter; Atrial fibrillation in her mother; Cancer in her daughter; Emphysema in her father; Hypertension in her mother; Stroke in her mother. Social History:  Patient  reports that she quit smoking about 46 years ago. Her smoking use included cigarettes. She has a 18.00 pack-year smoking history. She has never used smokeless tobacco. She reports that she does not drink alcohol and does not use drugs.  Review of Systems: Constitutional: Negative for fever malaise or anorexia Cardiovascular: negative for chest pain Respiratory: negative for SOB or persistent cough Gastrointestinal: negative for abdominal pain  Objective  Vitals: BP (!) 150/80   Pulse 87   Temp 97.9 F (36.6 C) (Temporal)   Ht 5\' 6"  (1.676 m)   Wt 147 lb (66.7 kg)   SpO2 97%   BMI 23.73 kg/m  General: no acute distress , A&Ox3 HEENT: PEERL, conjunctiva normal, neck is supple Cardiovascular:  RRR without murmur or gallop. Tr edema Respiratory:  Good breath sounds bilaterally, CTAB with normal respiratory effort Skin:  Warm, no rashes     Commons side effects, risks, benefits, and alternatives for medications and treatment plan prescribed today were discussed, and the patient  expressed understanding of the given instructions. Patient is instructed to call or message via MyChart if he/she has any questions or concerns regarding our treatment plan. No barriers to understanding were identified. We discussed Red Flag symptoms and signs in detail. Patient expressed understanding regarding what to do in case of urgent or emergency type symptoms.   Medication list was reconciled, printed and provided to the patient in AVS. Patient instructions and summary information was reviewed with the patient as documented in the AVS. This note was prepared with assistance of Dragon voice recognition software. Occasional wrong-word or sound-a-like substitutions may have occurred due to the inherent limitations of  voice recognition software  This visit occurred during the SARS-CoV-2 public health emergency.  Safety protocols were in place, including screening questions prior to the visit, additional usage of staff PPE, and extensive cleaning of exam room while observing appropriate contact time as indicated for disinfecting solutions.

## 2020-08-10 LAB — BASIC METABOLIC PANEL
BUN/Creatinine Ratio: 25 (calc) — ABNORMAL HIGH (ref 6–22)
BUN: 31 mg/dL — ABNORMAL HIGH (ref 7–25)
CO2: 24 mmol/L (ref 20–32)
Calcium: 10.1 mg/dL (ref 8.6–10.4)
Chloride: 98 mmol/L (ref 98–110)
Creat: 1.22 mg/dL — ABNORMAL HIGH (ref 0.60–0.88)
Glucose, Bld: 110 mg/dL — ABNORMAL HIGH (ref 65–99)
Potassium: 4.7 mmol/L (ref 3.5–5.3)
Sodium: 132 mmol/L — ABNORMAL LOW (ref 135–146)

## 2020-08-13 NOTE — Progress Notes (Signed)
Please call patient: I have reviewed his/her lab results. Labs look fine. May use lasix once or twice daily as needed to keep weight stable and leg swelling.

## 2020-08-23 ENCOUNTER — Other Ambulatory Visit: Payer: Self-pay | Admitting: Family Medicine

## 2020-09-02 ENCOUNTER — Other Ambulatory Visit: Payer: Self-pay | Admitting: Cardiovascular Disease

## 2020-09-03 ENCOUNTER — Other Ambulatory Visit: Payer: Self-pay

## 2020-09-03 ENCOUNTER — Ambulatory Visit (INDEPENDENT_AMBULATORY_CARE_PROVIDER_SITE_OTHER): Payer: Medicare Other | Admitting: Family Medicine

## 2020-09-03 ENCOUNTER — Encounter: Payer: Self-pay | Admitting: Family Medicine

## 2020-09-03 VITALS — BP 132/72 | HR 96 | Temp 97.9°F | Resp 18 | Ht 66.0 in | Wt 144.8 lb

## 2020-09-03 DIAGNOSIS — I5032 Chronic diastolic (congestive) heart failure: Secondary | ICD-10-CM

## 2020-09-03 DIAGNOSIS — E1121 Type 2 diabetes mellitus with diabetic nephropathy: Secondary | ICD-10-CM | POA: Diagnosis not present

## 2020-09-03 DIAGNOSIS — J4530 Mild persistent asthma, uncomplicated: Secondary | ICD-10-CM | POA: Diagnosis not present

## 2020-09-03 DIAGNOSIS — I110 Hypertensive heart disease with heart failure: Secondary | ICD-10-CM | POA: Diagnosis not present

## 2020-09-03 DIAGNOSIS — E871 Hypo-osmolality and hyponatremia: Secondary | ICD-10-CM

## 2020-09-03 DIAGNOSIS — N1831 Chronic kidney disease, stage 3a: Secondary | ICD-10-CM | POA: Diagnosis not present

## 2020-09-03 LAB — POCT GLYCOSYLATED HEMOGLOBIN (HGB A1C): Hemoglobin A1C: 5.9 % — AB (ref 4.0–5.6)

## 2020-09-03 NOTE — Progress Notes (Signed)
Subjective  CC:  Chief Complaint  Patient presents with  . Diabetes    No new concerns at this time.  Marland Kitchen Health Maintenance    Eye exam was completed on Aug 10, 2020    HPI: Carla Foster is a 80 y.o. female who presents to the office today for follow up of diabetes and problems listed above in the chief complaint.   Diabetes follow up: Her diabetic control is reported as Unchanged.  She tolerates Metformin 1000 twice daily.  Most recent GFR was above 35.  She reports she eats well.  Weight is down.  She denies symptoms of hyperglycemia. She denies exertional CP or SOB or symptomatic hypoglycemia. She denies foot sores or paresthesias.   Chronic heart failure: Reviewed most recent lab work.  Continues to be mildly hyponatremic but lower extremity edema is improved.  She continues on amlodipine and has follow-up with cardiology later this week.  Hypertensive heart disease: Blood pressure is much improved today.  Continues on low-sodium diet.  Stage II kidney failure: We will continue to monitor at regular intervals.  Tolerate Lasix for CHF.  Asthma: Taking Symbicort and has noticed improvement in breathing.  No recent exacerbations  Wt Readings from Last 3 Encounters:  09/03/20 144 lb 12.8 oz (65.7 kg)  08/09/20 147 lb (66.7 kg)  07/26/20 158 lb 6.4 oz (71.8 kg)     BP Readings from Last 3 Encounters:  09/03/20 132/72  08/09/20 (!) 150/80  07/26/20 134/60    Assessment  1. Type 2 diabetes mellitus with diabetic nephropathy, without long-term current use of insulin ([place])   2. Hypertensive heart disease with heart failure (HCC)   3. Stage 3a chronic kidney disease ([place])   4. Chronic heart failure with preserved ejection fraction (HCC)   5. Mild persistent asthma without complication   6. Hyponatremia      Plan   Diabetes is currently very well controlled.  Continue Metformin.  Monitor renal function.  Continue diabetic diet.  Hypertension and CHF: Compensated.  Good  control blood pressure today.  Follow-up with cardiology for further medication adjustments.  Continues to have mild lower extremity edema.  Continue Lasix daily.  Monitor kidney disease.  Asthma: Stable on Symbicort with as needed albuterol  Follow up: 3 months for recheck. Orders Placed This Encounter  Procedures  . POCT HgB A1C   No orders of the defined types were placed in this encounter.     Immunization History  Administered Date(s) Administered  . Fluad Quad(high Dose 65+) 04/10/2019, 06/05/2020  . Influenza, High Dose Seasonal PF 04/20/2016, 04/20/2017, 04/15/2018, 06/20/2019  . Pneumococcal Conjugate-13 04/10/2019  . Pneumococcal Polysaccharide-23 05/09/2007  . Td 03/30/2007  . Zoster Recombinat (Shingrix) 08/16/2020    Diabetes Related Lab Review: Lab Results  Component Value Date   HGBA1C 5.9 (A) 09/03/2020   HGBA1C 6.5 (H) 06/05/2020   HGBA1C 6.6 (A) 01/29/2020    No results found for: Derl Barrow Lab Results  Component Value Date   CREATININE 1.22 (H) 08/09/2020   BUN 31 (H) 08/09/2020   NA 132 (L) 08/09/2020   K 4.7 08/09/2020   CL 98 08/09/2020   CO2 24 08/09/2020   Lab Results  Component Value Date   CHOL 138 06/05/2020   CHOL 139 04/10/2019   CHOL 116 07/26/2018   Lab Results  Component Value Date   HDL 59 06/05/2020   HDL 61.10 04/10/2019   HDL 59 07/26/2018   Lab Results  Component  Value Date   LDLCALC 59 06/05/2020   LDLCALC 62 04/10/2019   LDLCALC 47 07/26/2018   Lab Results  Component Value Date   TRIG 114 06/05/2020   TRIG 77.0 04/10/2019   TRIG 49 07/26/2018   Lab Results  Component Value Date   CHOLHDL 2.3 06/05/2020   CHOLHDL 2 04/10/2019   CHOLHDL 2.0 07/26/2018   No results found for: LDLDIRECT The ASCVD Risk score Mikey Bussing DC Jr., et al., 2013) failed to calculate for the following reasons:   The 2013 ASCVD risk score is only valid for ages 65 to 50 I have reviewed the [place], Fam and Soc history. Patient  Active Problem List   Diagnosis Date Noted  . Hypertensive heart disease with heart failure ([place]) 09/12/2019    Priority: High  . History of right breast cancer 06/05/2019    Priority: High  . PAD (peripheral artery disease) ([place]) - asymptomatic 04/27/2019    Priority: High    [employer] ABI's by in home nurse assessment: Bilateral abnl: betweein 0.6 and 0.7 Can't tolerate aspirin   . Anemia of chronic disease 01/10/2019    Priority: High    12/2018 labs and 05/2020   . CKD (chronic kidney disease) stage 3, GFR 30-59 ml/min (HCC) 01/06/2019    Priority: High  . Chronic heart failure with preserved ejection fraction ([place]) 09/05/2018    Priority: High  . H/O: upper GI bleed 09/05/2018    Priority: High    11/2017, Peptic ulcer by EGD, Dr. Carlean Purl; hospitalized   . Type 2 diabetes mellitus ([place]) 07/26/2018    Priority: High  . Malignant hypertension 07/26/2018    Priority: High  . Pulmonary hypertension ([place]) 08/27/2015    Priority: High  . Hyperlipidemia 01/23/2011    Priority: High  . Hepatic steatosis 06/05/2020    Priority: Medium  . Primary osteoarthritis of right knee 01/29/2020    Priority: Medium  . Osteopenia 04/28/2019    Priority: Medium    dexa 04/2019 T = - 2.3 L/spine lowest. Recheck 2 years.    . Leg edema 12/08/2018    Priority: Medium  . Iron deficiency anemia 08/22/2018    Priority: Medium    Due to peptic ulcer/UGI bleed hospitalized 11/2017; EGD by Dr. Carlean Purl at that time.    . Acute on chronic diastolic CHF (congestive heart failure) ([place]) 07/31/2018    Priority: Medium  . Hearing loss 08/27/2015    Priority: Medium  . Asthma 08/27/2015    Priority: Medium  . Hyponatremia 11/20/2017    Social History: Patient  reports that she quit smoking about 46 years ago. Her smoking use included cigarettes. She has a 18.00 pack-year smoking history. She has never used smokeless tobacco. She reports that she does not drink alcohol and does not use  drugs.  Review of Systems: Ophthalmic: negative for eye pain, loss of vision or double vision Cardiovascular: negative for chest pain Respiratory: negative for SOB or persistent cough Gastrointestinal: negative for abdominal pain Genitourinary: negative for dysuria or gross hematuria MSK: negative for foot lesions Neurologic: negative for weakness or gait disturbance  Objective  Vitals: BP 132/72   Pulse 96   Temp 97.9 F (36.6 C) (Temporal)   Resp 18   Ht 5\' 6"  (1.676 m)   Wt 144 lb 12.8 oz (65.7 kg)   SpO2 98%   BMI 23.37 kg/m  General: well appearing, no acute distress  Psych:  Alert and oriented, normal mood and affect HEENT:  Normocephalic, atraumatic,  moist mucous membranes, supple neck  Cardiovascular:  Nl S1 and S2, RRR without murmur, gallop or rub.  +2 edema bilateral lower extremities Respiratory:  Good breath sounds bilaterally, CTAB with normal effort, no rales  Diabetic education: ongoing education regarding chronic disease management for diabetes was given today. We continue to reinforce the ABC's of diabetic management: A1c (<7 or 8 dependent upon patient), tight blood pressure control, and cholesterol management with goal LDL < 100 minimally. We discuss diet strategies, exercise recommendations, medication options and possible side effects. At each visit, we review recommended immunizations and preventive care recommendations for diabetics and stress that good diabetic control can prevent other problems. See below for this patient's data.    Commons side effects, risks, benefits, and alternatives for medications and treatment plan prescribed today were discussed, and the patient expressed understanding of the given instructions. Patient is instructed to call or message via MyChart if he/she has any questions or concerns regarding our treatment plan. No barriers to understanding were identified. We discussed Red Flag symptoms and signs in detail. Patient expressed  understanding regarding what to do in case of urgent or emergency type symptoms.   Medication list was reconciled, printed and provided to the patient in AVS. Patient instructions and summary information was reviewed with the patient as documented in the AVS. This note was prepared with assistance of Dragon voice recognition software. Occasional wrong-word or sound-a-like substitutions may have occurred due to the inherent limitations of voice recognition software  This visit occurred during the SARS-CoV-2 public health emergency.  Safety protocols were in place, including screening questions prior to the visit, additional usage of staff PPE, and extensive cleaning of exam room while observing appropriate contact time as indicated for disinfecting solutions.

## 2020-09-03 NOTE — Patient Instructions (Signed)
Please return in 3 months for diabetes follow up and blood pressure and kidney.  We have not made any changes today.   If you have any questions or concerns, please don't hesitate to send me a message via MyChart or call the office at [phone]. Thank you for visiting with [place] today! It's our pleasure caring for you.

## 2020-09-04 ENCOUNTER — Other Ambulatory Visit: Payer: Self-pay | Admitting: Cardiovascular Disease

## 2020-09-05 ENCOUNTER — Other Ambulatory Visit: Payer: Self-pay | Admitting: Cardiovascular Disease

## 2020-09-06 ENCOUNTER — Other Ambulatory Visit: Payer: Self-pay

## 2020-09-06 ENCOUNTER — Encounter: Payer: Self-pay | Admitting: Cardiovascular Disease

## 2020-09-06 ENCOUNTER — Ambulatory Visit: Payer: Medicare Other | Admitting: Cardiovascular Disease

## 2020-09-06 VITALS — BP 164/66 | HR 104 | Ht 66.0 in | Wt 145.4 lb

## 2020-09-06 DIAGNOSIS — I110 Hypertensive heart disease with heart failure: Secondary | ICD-10-CM | POA: Diagnosis not present

## 2020-09-06 DIAGNOSIS — I10 Essential (primary) hypertension: Secondary | ICD-10-CM | POA: Diagnosis not present

## 2020-09-06 MED ORDER — LOSARTAN POTASSIUM 100 MG PO TABS: 100.0000 mg | ORAL_TABLET | Freq: Every day | ORAL | 3 refills | Status: DC

## 2020-09-06 MED ORDER — CARVEDILOL 25 MG PO TABS: 25.0000 mg | ORAL_TABLET | Freq: Two times a day (BID) | ORAL | 3 refills | Status: DC

## 2020-09-06 NOTE — Progress Notes (Signed)
Date:  09/06/2020   ID:  [name], DOB 26-Jan-1941, MRN [id number]  Patient Location: Home Provider Location: Office  PCP:  Leamon Arnt, MD  Cardiologist:   Leala Bryand  Electrophysiologist:  None   Evaluation Performed:  Follow-Up Visit  Problems: 1. Hypertension 2. Hyperlipidemia 3. Diabetes mellitus   [name] is a 80 year old female with a history of hypertension and hyperlipidemia. She also has a history of type 2 diabetes mellitus. She's been having problems with elevated blood pressure recently. I saw her in July for hypertension.  She feels fine. She's not had any episodes of chest pain or shortness breath. She continues to lose weight.  She has been told  to watch her salt intake. She used to use a lot of fried bologna.  January 17, 2013:  She is doing well.  No CP , no dyspnea.   Still eating salt.   March 09, 2014:  Ms. Ferch is doing well.  Walking regularly.  Hard of hearing - bought an expensive heaing aid that does not work well.   No CP or dyspnea.    Aug. 29, 2016:  Seen back today for HTN BP is a bit up today . Had a stomach virus yesterday - better today  Has not been eating any extra salt . No CP or dyspnea  Aug. 29, 2017:  Doing well.   Had some cataract surgery recently .  Stays active.   Mows with a push mower.  Tries to watch her salt   Dec.  6, 2018:     Seen for follow up of her HTN Has had lots of dental work  Does not avoid salty foods  - loves country ham - eats once a week,  Eats salty foods regularly , does not cook for herself  January 11, 2018:   Ms. Linden is seen today for follow-up of her hypertension and hyperlipidemia.  Still eats some sausage and ham .    Taking and tolerating all her meds.  Very active.   Was in the hospital with bleeding ulcer .    Received 2 units PRBC. .  Was taking ASA and advil for back pain .  Has a cough - is on Lisinopril  Will change to Losartan    Chief Complaint:  Follow up CHF    History of Present Illness:    KHRISTIE Foster is a 80 y.o. female with chronic combined CHF. She was admitted in Jan. 2020 with community acquired pneumonia complicated by acute on chronic diastolic CHF .  [facility]cho showed EF of 60-65%.  Grade 2 diastolic dysfunciton, mild AI,  Severe pulmonary HTN   Is having right leg swelling .  Worse in the eveing.  Better in the am  No swelling in left leg    The patient does not have symptoms concerning for COVID-19 infection (fever, chills, cough, or new shortness of breath).   September 12, 2019: Carla Foster  is seen today for follow-up of her chronic diastolic congestive heart failure.  She has mild aortic insufficiency.  She has severe pulmonary hypertension. Just had right breast surgery and radiation therapy Is recovering well  Surgery seems to be successful  Tries to stay away from salty foods.  Leg swelling is better  BP is a bit elevated .   Will increase Losartan to 100 mg a day   September 06, 2020:  Carla Foster is seen today for follow-up of her chronic diastolic congestive heart failure, mild aortic.  Seen with Shelli today ( granddaughter)  insufficiency, severe pulmonary hypertension and mild hypertension. Blood pressure remains mildly elevated. BP has been well controlled at home  Avoids salt for the most part   Past Medical History:  Diagnosis Date  . Acute blood loss anemia   . AKI (acute kidney injury) ([place])   . Asthma   . CKD (chronic kidney disease) stage 3, GFR 30-59 ml/min ([place]) 01/06/2019  . Complication of anesthesia    nausea  . Diabetes mellitus    type 2  . Family history of upper GI bleeding 09/05/2018   Dr. Carlean Purl, hospitalized: egd 11/2017  . GERD (gastroesophageal reflux disease)   . GI bleed   . H/O: upper GI bleed 09/05/2018   11/2017, Peptic ulcer by EGD, Dr. Carlean Purl; hospitalized  . Hematemesis   . History of radiation therapy 08/07/19- 09/01/19   Right Breast 15 fx of 2.67 Gy each to total 40.05 Gy. Right breast  boost of 5 fx of 2 Gy each to total 10 Gy  . Hyperlipidemia   . Hypertension   . Melena   . Osteopenia 04/28/2019   dexa 04/2019 T = - 2.3 L/spine lowest. Recheck 2 years.   Marland Kitchen PAD (peripheral artery disease) ([place]) - asymptomatic 04/27/2019   [facility] ABI's by in home nurse assessment: Bilateral abnl: betweein 0.6 and 0.7 Can't tolerate aspirin  . PONV (postoperative nausea and vomiting)    Past Surgical History:  Procedure Laterality Date  . ABDOMINAL HYSTERECTOMY    . BLADDER SURGERY    . BREAST LUMPECTOMY WITH RADIOACTIVE SEED LOCALIZATION Right 07/10/2019   Procedure: RIGHT BREAST LUMPECTOMY WITH RADIOACTIVE SEED LOCALIZATION;  Surgeon: Jovita Kussmaul, MD;  Location: [facility];  Service: General;  Laterality: Right;  . BREAST SURGERY    . CATARACT EXTRACTION W/ INTRAOCULAR LENS IMPLANT Left 11/13/2015  . CATARACT EXTRACTION W/ INTRAOCULAR LENS IMPLANT Right   . CERVICAL DISCECTOMY  08/23/00   C5-6, C6-7 anterior cervical diskectomy with fibular bone bank fusion followed by [place]antis anterior cervical plating with the operating microscope  --  SURGEON:  Faythe Ghee, M.D.  . ESOPHAGOGASTRODUODENOSCOPY (EGD) WITH PROPOFOL N/A 11/21/2017   Procedure: ESOPHAGOGASTRODUODENOSCOPY (EGD) WITH PROPOFOL;  Surgeon: Gatha Mayer, MD;  Location: [facility];  Service: Endoscopy;  Laterality: N/A;  . PARTIAL HYSTERECTOMY    . TEE WITHOUT CARDIOVERSION N/A 08/01/2018   Procedure: TRANSESOPHAGEAL ECHOCARDIOGRAM (TEE);  Surgeon: Skeet Latch, MD;  Location: [facility];  Service: Cardiovascular;  Laterality: N/A;     Current Meds  Medication Sig  . albuterol (VENTOLIN HFA) 108 (90 Base) MCG/ACT inhaler Inhale 2 puffs into the lungs every 6 (six) hours as needed for wheezing or shortness of breath.  Marland Kitchen amLODipine (NORVASC) 10 MG tablet TAKE 1 TABLET BY MOUTH  DAILY  . budesonide-formoterol (SYMBICORT) 80-4.5 MCG/ACT inhaler Inhale 2 puffs into the lungs 2 (two) times daily.   . Calcium Carbonate-Vitamin D (CALCIUM 600 + D PO) Take 1 tablet by mouth daily.  . diclofenac Sodium (VOLTAREN) 1 % GEL Apply 4 g topically 4 (four) times daily as needed (right knee pain).  Marland Kitchen doxazosin (CARDURA) 4 MG tablet TAKE 1 TABLET BY MOUTH AT  BEDTIME  . fenofibrate 160 MG tablet TAKE 1 TABLET BY MOUTH  DAILY  . ferrous sulfate 325 (65 FE) MG tablet Take 1 tablet (325 mg total) by mouth daily.  . furosemide (LASIX) 40 MG tablet TAKE 1 TABLET BY MOUTH DAILY  . metFORMIN (GLUCOPHAGE) 1000  MG tablet TAKE 1 TABLET BY MOUTH  TWICE DAILY WITH A MEAL  . Multiple Vitamin (MULTIVITAMIN) tablet Take 1 tablet by mouth daily.  . simvastatin (ZOCOR) 20 MG tablet TAKE 1 TABLET BY MOUTH AT  BEDTIME  . vitamin A 8000 UNIT capsule Take 8,000 Units by mouth daily.   . vitamin B-12 (CYANOCOBALAMIN) 500 MCG tablet Take 500 mcg by mouth daily.  . vitamin C (ASCORBIC ACID) 500 MG tablet Take 500 mg by mouth daily.  . [DISCONTINUED] carvedilol (COREG) 25 MG tablet Take 1 tablet (25 mg total) by mouth 2 (two) times daily.  . [DISCONTINUED] losartan (COZAAR) 100 MG tablet TAKE 1 TABLET BY MOUTH  DAILY     Allergies:   Tiazac [diltiazem hcl]   Social History   Tobacco Use  . Smoking status: Former Smoker    Packs/day: 1.00    Years: 18.00    Pack years: 18.00    Types: Cigarettes    Quit date: 01/13/1974    Years since quitting: 46.6  . Smokeless tobacco: Never Used  Vaping Use  . Vaping Use: Never used  Substance Use Topics  . Alcohol use: No  . Drug use: No     Family Hx: The patient's family history includes Asthma in her daughter; Atrial fibrillation in her mother; Cancer in her daughter; Emphysema in her father; Hypertension in her mother; Stroke in her mother.  ROS:   Please see the history of present illness.     All other systems reviewed and are negative.   Prior CV studies:   The following studies were reviewed today:    Labs/Other Tests and Data Reviewed:    EKG:    September 06, 2020: Sinus tachycardia at 104.  No ST or T wave changes.  Recent Labs: 06/05/2020: Hemoglobin 9.8; Platelets 284; TSH 2.91 07/26/2020: ALT 8; Pro B Natriuretic peptide (BNP) 225.0 08/09/2020: BUN 31; Creat 1.22; Potassium 4.7; Sodium 132   Recent Lipid Panel Lab Results  Component Value Date/Time   CHOL 138 06/05/2020 10:05 AM   CHOL 131 01/11/2018 10:11 AM   TRIG 114 06/05/2020 10:05 AM   HDL 59 06/05/2020 10:05 AM   HDL 58 01/11/2018 10:11 AM   CHOLHDL 2.3 06/05/2020 10:05 AM   LDLCALC 59 06/05/2020 10:05 AM    Wt Readings from Last 3 Encounters:  09/06/20 145 lb 6.4 oz (66 kg)  09/03/20 144 lb 12.8 oz (65.7 kg)  08/09/20 147 lb (66.7 kg)     Objective:     Physical Exam: Blood pressure (!) 164/66, pulse (!) 104, height 5\' 6"  (1.676 m), weight 145 lb 6.4 oz (66 kg), SpO2 97 %.  GEN:  Well nourished, well developed in no acute distress HEENT: Normal NECK: No JVD; No carotid bruits LYMPHATICS: No lymphadenopathy CARDIAC: RRR , no murmurs, rubs, gallops RESPIRATORY:  Clear to auscultation without rales, wheezing or rhonchi  ABDOMEN: Soft, non-tender, non-distended MUSCULOSKELETAL:  No edema; No deformity  SKIN: Warm and dry NEUROLOGIC:  Alert and oriented x 3    ASSESSMENT & PLAN:        1. Chronic diastolic CHF -  Stable  Cont to watch her salt    2. HTN:  - mildly elevated - she is out of her coreg.   have refilled her coreg.  Salt restriction      Medication Adjustments/Labs and Tests Ordered: Current medicines are reviewed at length with the patient today.  Concerns regarding medicines are outlined above.   Tests  Ordered: Orders Placed This Encounter  Procedures  . EKG 12-Lead    Medication Changes: Meds ordered this encounter  Medications  . carvedilol (COREG) 25 MG tablet    Sig: Take 1 tablet (25 mg total) by mouth 2 (two) times daily.    Dispense:  180 tablet    Refill:  3  . losartan (COZAAR) 100 MG tablet    Sig: Take 1  tablet (100 mg total) by mouth daily.    Dispense:  90 tablet    Refill:  3    Requesting 1 year supply    Disposition:  Follow up with APP in 1 year   Signed, Mertie Moores, MD  09/06/2020 10:13 AM    [place]

## 2020-09-06 NOTE — Patient Instructions (Signed)
Medication Instructions:  Your physician recommends that you continue on your current medications as directed. Please refer to the Current Medication list given to you today.  *If you need a refill on your cardiac medications before your next appointment, please call your pharmacy*   Lab Work: None If you have labs (blood work) drawn today and your tests are completely normal, you will receive your results only by: MyChart Message (if you have MyChart) OR A paper copy in the mail If you have any lab test that is abnormal or we need to change your treatment, we will call you to review the results.   Testing/Procedures: None   Follow-Up: At [facility], you and your health needs are our priority.  As part of our continuing mission to provide you with exceptional heart care, we have created designated Provider Care Teams.  These Care Teams include your primary Cardiologist (physician) and Advanced Practice Providers (APPs -  Physician Assistants and Nurse Practitioners) who all work together to provide you with the care you need, when you need it.  We recommend signing up for the patient portal called "MyChart".  Sign up information is provided on this After Visit Summary.  MyChart is used to connect with patients for Virtual Visits (Telemedicine).  Patients are able to view lab/test results, encounter notes, upcoming appointments, etc.  Non-urgent messages can be sent to your provider as well.   To learn more about what you can do with MyChart, go to [email or web address].    Your next appointment:   1 year(s)  The format for your next appointment:   In Person  Provider:   You will see one of the following Advanced Practice Providers on your designated Care Team:   Scott Weaver, PA-C Vin Bhagat, PA-C   Other Instructions   

## 2020-10-07 ENCOUNTER — Other Ambulatory Visit: Payer: Self-pay

## 2020-10-07 ENCOUNTER — Telehealth: Payer: Self-pay

## 2020-10-07 MED ORDER — FUROSEMIDE 40 MG PO TABS: ORAL_TABLET | ORAL | 3 refills | Status: DC

## 2020-10-07 NOTE — Telephone Encounter (Signed)
..   LAST APPOINTMENT DATE: 09/03/2020   NEXT APPOINTMENT DATE:@5 /19/2022  MEDICATION:furosemide (LASIX) 40 MG tablet

## 2020-10-07 NOTE — Telephone Encounter (Signed)
Refill sent.

## 2020-10-24 ENCOUNTER — Telehealth: Payer: Self-pay | Admitting: Family Medicine

## 2020-10-24 NOTE — Telephone Encounter (Signed)
Left message for patient to call back and schedule Medicare Annual Wellness Visit (AWV) either virtually or in office. No detailed message left    Last AWV 10/27/19  please schedule at anytime   This should be a 45 minute visit.

## 2020-10-24 NOTE — Telephone Encounter (Signed)
Patient states she does not want to do an AWV

## 2020-11-01 ENCOUNTER — Telehealth: Payer: Self-pay | Admitting: Family Medicine

## 2020-11-01 NOTE — Chronic Care Management (AMB) (Signed)
  Chronic Care Management   Note  11/01/2020 Name: RANDILYN FOISY MRN: [id number] DOB: 1940-10-03  TRICHA RUGGIRELLO is a 80 y.o. year old female who is a primary care patient of Leamon Arnt, MD. I reached out to Janeece Riggers by phone today in response to a referral sent by Ms. Annitta Jersey [place]'s PCP, Leamon Arnt, MD.   Ms. Devincenzi was given information about Chronic Care Management services today including:  1. CCM service includes personalized support from designated clinical staff supervised by her physician, including individualized plan of care and coordination with other care providers 2. 24/7 contact phone numbers for assistance for urgent and routine care needs. 3. Service will only be billed when office clinical staff spend 20 minutes or more in a month to coordinate care. 4. Only one practitioner may furnish and bill the service in a calendar month. 5. The patient may stop CCM services at any time (effective at the end of the month) by phone call to the office staff.   Patient agreed to services and verbal consent obtained.   Follow up plan:   Lauretta Grill [employer] Scheduler

## 2020-11-03 IMAGING — MG MM PLC BREAST LOC DEV 1ST LESION INC*R*
7 series · 7 of 7 positions shown · non-contrast
Comparison: Previous exam(s).

CLINICAL DATA: 78-year-old female with recently diagnosed right
breast cancer presents for radioactive seed localization.

EXAM:
MAMMOGRAPHIC GUIDED RADIOACTIVE SEED LOCALIZATION OF THE RIGHT
BREAST

[R CC (1 of 4)]
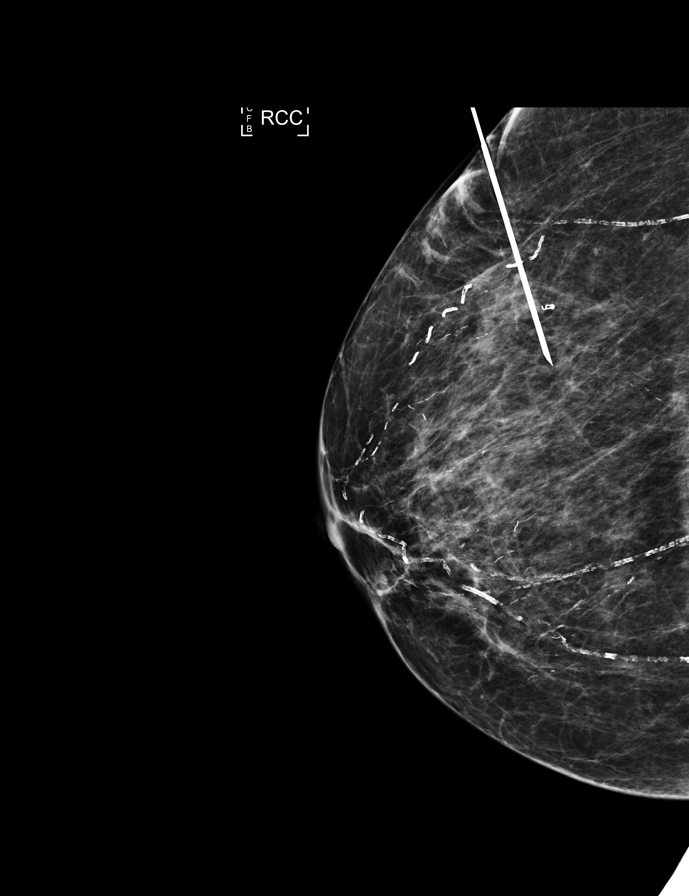

[R ML]
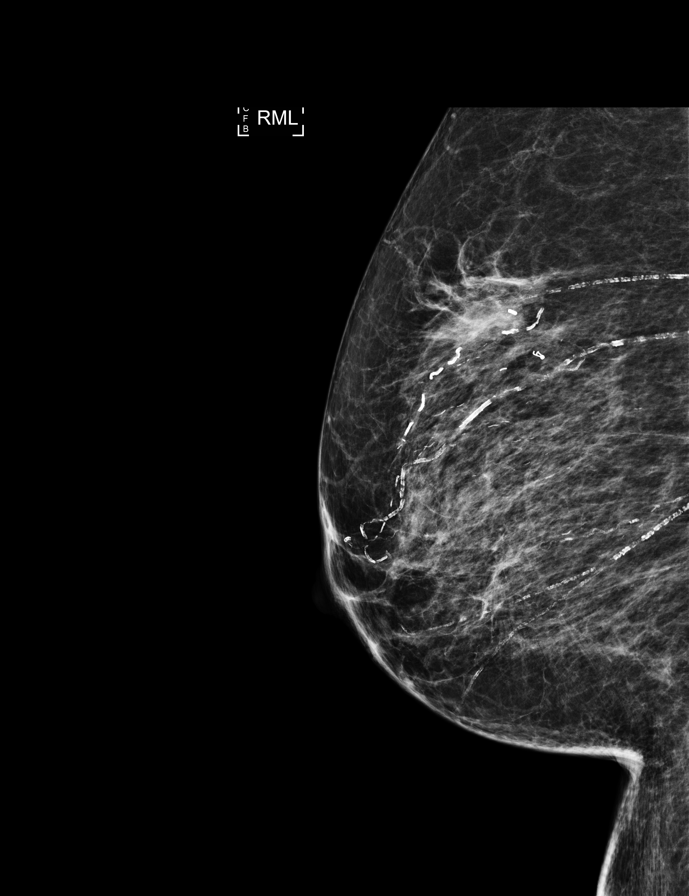

[R LM (1 of 2)]
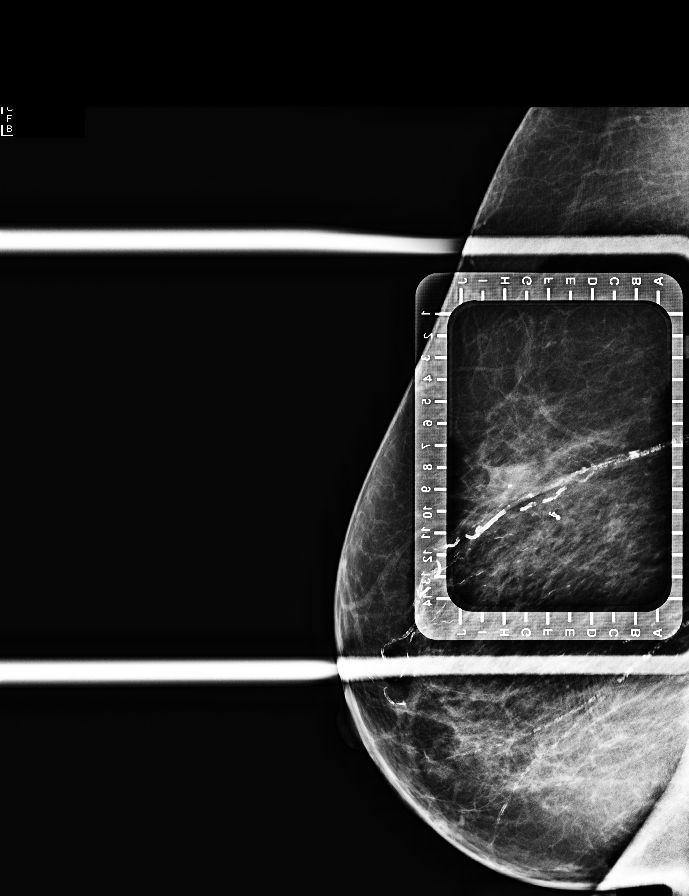

[R LM (2 of 2)]
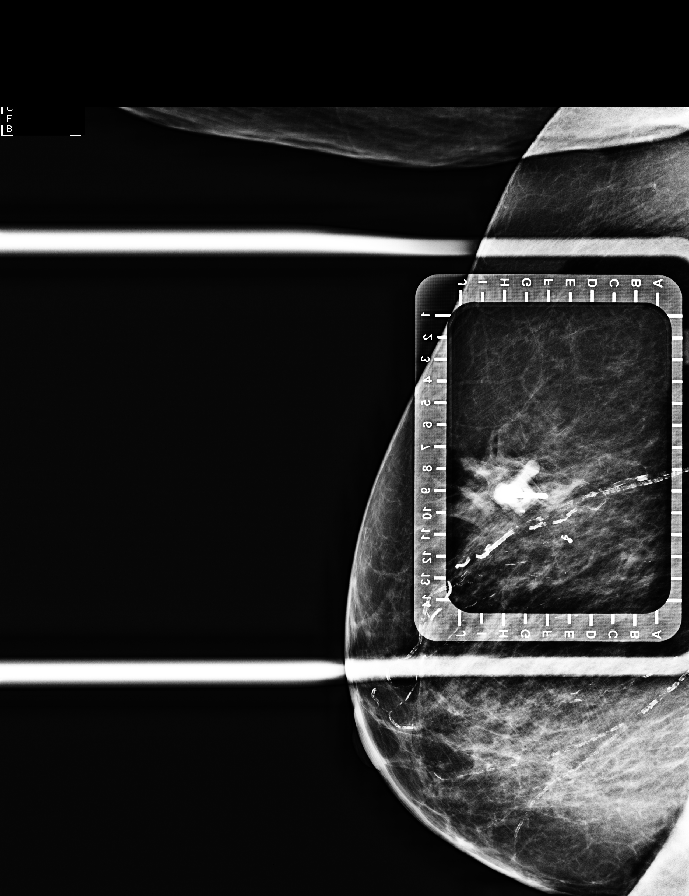

[R CC (2 of 4)]
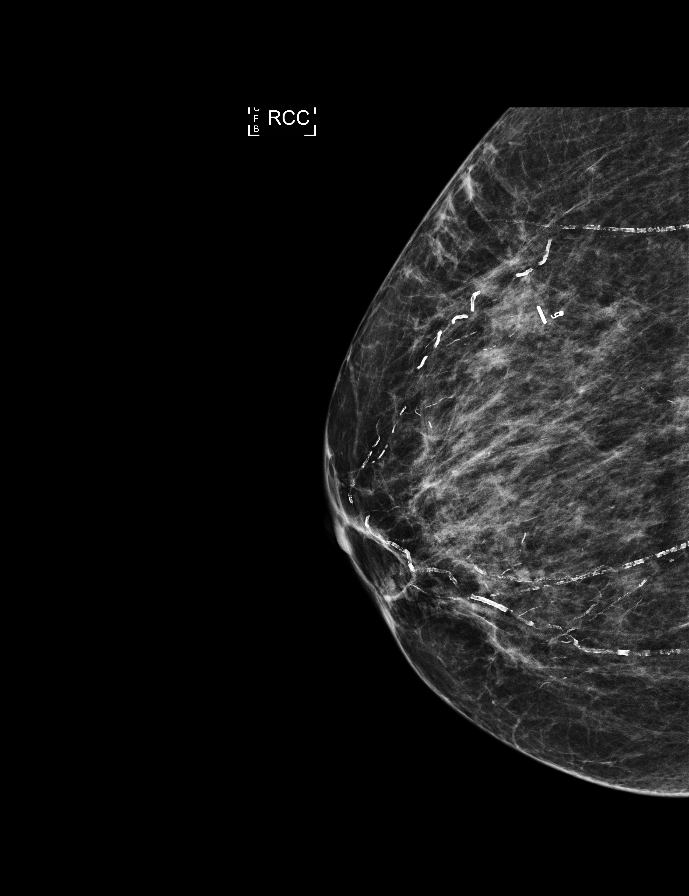

[R CC (3 of 4)]
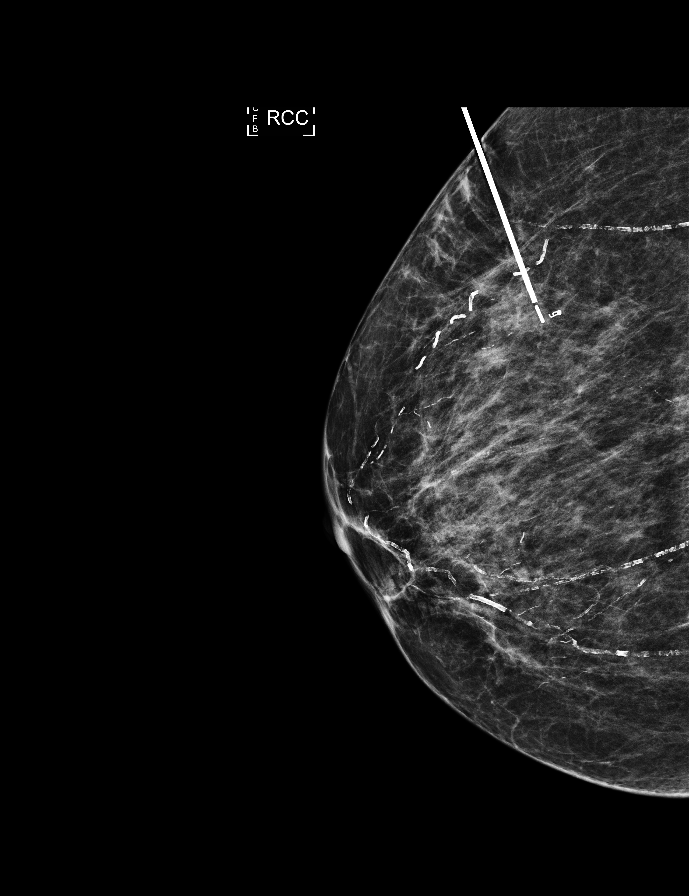

[R CC (4 of 4)]
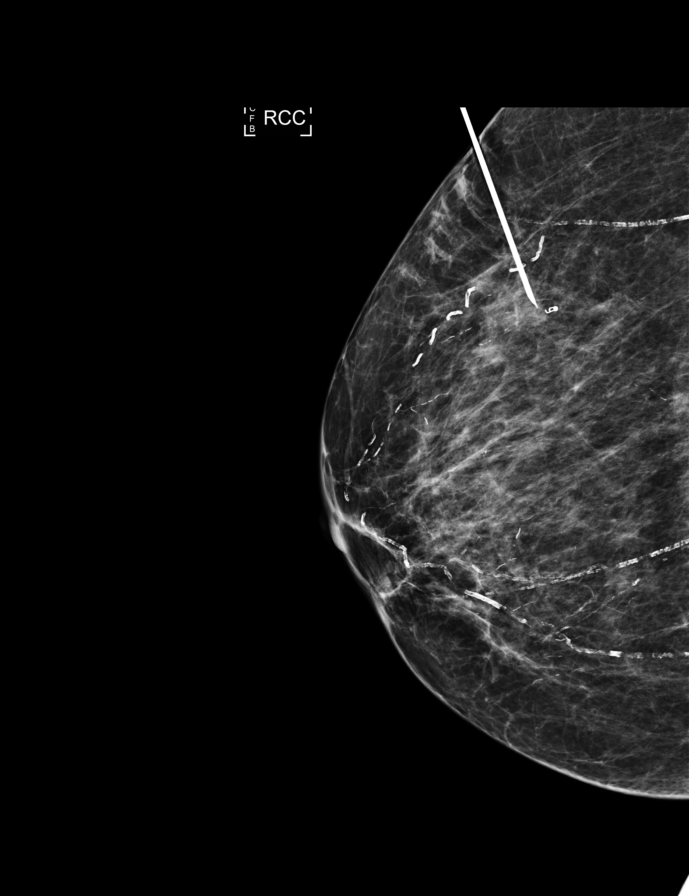

[7 of 7 positions shown; findings below may reference images not displayed]

FINDINGS: Patient presents for radioactive seed localization prior to right
breast lumpectomy. I met with the patient and we discussed the
procedure of seed localization including benefits and alternatives.
We discussed the high likelihood of a successful procedure. We
discussed the risks of the procedure including infection, bleeding,
tissue injury and further surgery. We discussed the low dose of
radioactivity involved in the procedure. Informed, written consent
was given.

The usual time-out protocol was performed immediately prior to the
procedure.

Using mammographic guidance, sterile technique, 1% lidocaine and an
[id number] radioactive seed, the mass with the adjacent wing shaped
biopsy marking clip was localized using a lateral to medial
approach. Note that the biopsy marking clip is located approximately
1.5 cm inferior to the mass. The follow-up mammogram images confirm
the seed in the expected location and were marked for Dr. Gei.

Follow-up survey of the patient confirms presence of the radioactive
seed.

Order number of [id number] seed:  [id number].

Total activity:  0.246 millicuries reference Date: 06/19/2019

The patient tolerated the procedure well and was released from the
[REDACTED]. She was given instructions regarding seed removal.
IMPRESSION: Radioactive seed localization right breast. The radioactive seed is
at the site of the biopsied mass, with the biopsy marking clip
located approximately 1.5 cm inferior to the biopsied mass and
radioactive seed. No apparent complications.

## 2020-11-14 ENCOUNTER — Telehealth: Payer: Self-pay

## 2020-11-14 NOTE — Chronic Care Management (AMB) (Signed)
Chronic Care Management Pharmacy Assistant   Name: Carla Foster  MRN: [id number] DOB: 1941/05/27  Reason for Encounter: Chart Prep   Recent office visits:  09/03/20- Billey Chang, MD- chronic conditions addressed, follow up 3 months for BP and kidney 01/21/22Billey Chang, MD- seen for CHF, shingrix vaccine administered, follow up as scheduled 07/26/20- Billey Chang, MD- Seen for  Leg swelling/ chronic conditions,  increased lasix to twice daily for 1 week and recheck with labs in 2 weeks, started symbicort bid 06/05/20-  Billey Chang, MD- chronic conditions addressed, follow up 3 months  Recent consult visits:  09/06/20- Nahser, Wonda Cheng, MD( Cardiology)- follow up visit for htn, short course prednisone  x5 DS follow up 1 yr   Hospital visits:  None in previous 6 months  Medications: Outpatient Encounter Medications as of 11/14/2020  Medication Sig  . albuterol (VENTOLIN HFA) 108 (90 Base) MCG/ACT inhaler Inhale 2 puffs into the lungs every 6 (six) hours as needed for wheezing or shortness of breath.  Marland Kitchen amLODipine (NORVASC) 10 MG tablet TAKE 1 TABLET BY MOUTH  DAILY  . budesonide-formoterol (SYMBICORT) 80-4.5 MCG/ACT inhaler Inhale 2 puffs into the lungs 2 (two) times daily.  . Calcium Carbonate-Vitamin D (CALCIUM 600 + D PO) Take 1 tablet by mouth daily.  . carvedilol (COREG) 25 MG tablet Take 1 tablet (25 mg total) by mouth 2 (two) times daily.  . diclofenac Sodium (VOLTAREN) 1 % GEL Apply 4 g topically 4 (four) times daily as needed (right knee pain).  Marland Kitchen doxazosin (CARDURA) 4 MG tablet TAKE 1 TABLET BY MOUTH AT  BEDTIME  . fenofibrate 160 MG tablet TAKE 1 TABLET BY MOUTH  DAILY  . ferrous sulfate 325 (65 FE) MG tablet Take 1 tablet (325 mg total) by mouth daily.  . furosemide (LASIX) 40 MG tablet TAKE 1 TABLET BY MOUTH DAILY  . losartan (COZAAR) 100 MG tablet Take 1 tablet (100 mg total) by mouth daily.  . metFORMIN (GLUCOPHAGE) 1000 MG tablet TAKE 1 TABLET BY MOUTH  TWICE  DAILY WITH A MEAL  . Multiple Vitamin (MULTIVITAMIN) tablet Take 1 tablet by mouth daily.  . simvastatin (ZOCOR) 20 MG tablet TAKE 1 TABLET BY MOUTH AT  BEDTIME  . vitamin A 8000 UNIT capsule Take 8,000 Units by mouth daily.   . vitamin B-12 (CYANOCOBALAMIN) 500 MCG tablet Take 500 mcg by mouth daily.  . vitamin C (ASCORBIC ACID) 500 MG tablet Take 500 mg by mouth daily.   No facility-administered encounter medications on file as of 11/14/2020.   Spoke with [name] this morning and we discussed some of the things that she enjoys doing.  She mainly stays at home doing puzzles and entertaining herself with activities in her home. When she is tired of doing house work she walks around or sits on the porch to occupy herself. She has allergies and the pollen/ cold discourages her from going outside.   Ventolin 108 mcg/act- 20 DS last filled 07/24/20- 2 Amlodipine 10 mg- 90 DS last filled 09/18/20 Symbicort 80- 4.5 mcg/ act- 30 DS last filled 11/04/20 Calcium 600 + D Carvedilol 25 mg- 90 DS last filled 09/06/20 Diclofenac Sodium 1% - 90 DS last filled 10/29/19 Doxazosin 4 mg- 90 DS last filled 10/01/20 Fenofibrate 160 mg- 90 DS last filled 11/01/20 Ferrous Sulfate 325 mg Furosemide 40 mg - 90 DS last filled 10/07/20 Losartan 100 mg - 90 DS last filled 10/29/20 Metformin 1000 mg- 90 DS last filled 09/18/20  Multivitamin  Simvastatin 20 mg- 90 DS last filled 11/10/20 Vitamin A 8000 unit Vitamin B- 12 500 mcg Vitamin C 500 mg  Have you seen any other providers since your last visit? 09/06/20- Nahser, Wonda Cheng, MD( Cardiology)- follow up visit for htn  Any changes in your medications or health?  Patient stated no changes to medications  Any side effects from any medications?  Patient stated she sometimes has diarrhea after taking medications and eating greasy food.  Do you have an symptoms or problems not managed by your medications?  Patient stated she has no unmanaged problems  Any  concerns about your health right now?  Patient stated she has no concerns currently  Has your provider asked that you check blood pressure, blood sugar, or follow special diet at home?  Patient eats a healthy serving of fruit as a part of her diet to remain healthy  Do you get any type of exercise on a regular basis?  Patient stated she consistently walks around her house and sometimes goes outside when it is nice.   Can you think of a goal you would like to reach for your health?  Patient did not have any goals at this moment   Do you have any problems getting your medications? Patient receives medications through [facility] and has no issues  Is there anything that you would like to discuss during the appointment?  Patient did not have anything specific to discuss.   Please bring medications and supplements to appointment Reminded patient of initial in- person visit with CPP on 05/03 at 2 pm   [facility] Goodnight, [place]

## 2020-11-19 ENCOUNTER — Ambulatory Visit: Payer: Medicare Other

## 2020-11-19 ENCOUNTER — Ambulatory Visit (INDEPENDENT_AMBULATORY_CARE_PROVIDER_SITE_OTHER): Payer: Medicare Other

## 2020-11-19 ENCOUNTER — Other Ambulatory Visit: Payer: Self-pay

## 2020-11-19 DIAGNOSIS — I5033 Acute on chronic diastolic (congestive) heart failure: Secondary | ICD-10-CM

## 2020-11-19 DIAGNOSIS — J4530 Mild persistent asthma, uncomplicated: Secondary | ICD-10-CM | POA: Diagnosis not present

## 2020-11-19 DIAGNOSIS — E119 Type 2 diabetes mellitus without complications: Secondary | ICD-10-CM

## 2020-11-19 DIAGNOSIS — I5032 Chronic diastolic (congestive) heart failure: Secondary | ICD-10-CM

## 2020-11-19 DIAGNOSIS — M8580 Other specified disorders of bone density and structure, unspecified site: Secondary | ICD-10-CM

## 2020-11-19 NOTE — Progress Notes (Signed)
Chronic Care Management Pharmacy Note  11/21/2020 Name:  Carla Foster MRN:  [id number] DOB:  22-Nov-1940  Subjective: Carla Foster is an 80 y.o. year old female who is a primary patient of Leamon Arnt, MD.  The CCM team was consulted for assistance with disease management and care coordination needs.  Accompanied by daughter Audrea Muscat.    Engaged with patient face to face for initial visit in response to provider referral for pharmacy case management and/or care coordination services.   Consent to Services:  The patient was given the following information about Chronic Care Management services today, agreed to services, and gave verbal consent: 1. CCM service includes personalized support from designated clinical staff supervised by the primary care provider, including individualized plan of care and coordination with other care providers 2. 24/7 contact phone numbers for assistance for urgent and routine care needs. 3. Service will only be billed when office clinical staff spend 20 minutes or more in a month to coordinate care. 4. Only one practitioner may furnish and bill the service in a calendar month. 5.The patient may stop CCM services at any time (effective at the end of the month) by phone call to the office staff. 6. The patient will be responsible for cost sharing (co-pay) of up to 20% of the service fee (after annual deductible is met). Patient agreed to services and consent obtained.  Patient Care Team: Leamon Arnt, MD as PCP - General (Family Medicine) Nahser, Wonda Cheng, MD as PCP - Cardiology (Cardiology) Juanita Craver, MD as Consulting Physician (Gastroenterology) Mammography, [place] (Diagnostic Radiology) Mauro Kaufmann, RN as Oncology Nurse Navigator Rockwell Germany, RN as Oncology Nurse Navigator Magrinat, Virgie Dad, MD as Consulting Physician (Oncology) Eppie Gibson, MD as Consulting Physician (Radiation Oncology) Rutherford Guys, MD as Consulting Physician  (Ophthalmology) Vicie Mutters, MD as Consulting Physician (Otolaryngology) Madelin Rear, [facility] as Pharmacist (Pharmacist)  Recent office visits:  09/03/20- Billey Chang, MD- chronic conditions addressed, follow up 3 months for BP and kidney 08/09/20- Billey Chang, MD- seen for CHF, shingrix vaccine administered, follow up as scheduled 07/26/20- Billey Chang, MD- Seen for  Leg swelling/ chronic conditions,  increased lasix to twice daily for 1 week and recheck with labs in 2 weeks, started symbicort bid 06/05/20-  Billey Chang, MD- chronic conditions addressed, follow up 3 months  Recent consult visits:  09/06/20- Nahser, Wonda Cheng, MD( Cardiology)- follow up visit for htn, short course prednisone  x5 DS follow up 1 yr   Hospital visits:  None in previous 6 months  Objective:  Lab Results  Component Value Date   CREATININE 1.22 (H) 08/09/2020   CREATININE 1.19 07/26/2020   CREATININE 1.26 (H) 06/20/2020   Lab Results  Component Value Date   HGBA1C 5.9 (A) 09/03/2020   HGBA1C 6.5 (H) 06/05/2020   HGBA1C 6.6 (A) 01/29/2020   HGBA1C 8.1 (A) 10/27/2019   HGBA1C 6.7 (A) 04/10/2019    Last diabetic Eye exam:  Lab Results  Component Value Date/Time   HMDIABEYEEXA No Retinopathy 07/31/2019 12:00 AM    Last diabetic Foot exam: No results found for: HMDIABFOOTEX      Component Value Date/Time   CHOL 138 06/05/2020 1005   CHOL 131 01/11/2018 1011   TRIG 114 06/05/2020 1005   HDL 59 06/05/2020 1005   HDL 58 01/11/2018 1011   CHOLHDL 2.3 06/05/2020 1005   VLDL 15.4 04/10/2019 1134   [place] 59 06/05/2020 1005    Hepatic Function  Latest Ref Rng & Units 07/26/2020 06/05/2020 06/07/2019  Total Protein 6.0 - 8.3 g/dL 6.9 6.4 7.1  Albumin 3.5 - 5.2 g/dL 3.9 - 3.8  AST 0 - 37 U/L _0 ALT 0 - 35 U/L _1 Alk Phosphatase 39 - 117 U/L 23(L) - 27(L)  Total Bilirubin 0.2 - 1.2 mg/dL 0.5 0.4 0.2(L)  Bilirubin, Direct 0.0 - 0.2 mg/dL - - -    Lab Results  Component Value  Date/Time   TSH 2.91 06/05/2020 10:05 AM   TSH 2.35 04/10/2019 11:34 AM    CBC Latest Ref Rng & Units 06/05/2020 06/07/2019 04/10/2019  WBC 3.8 - 10.8 Thousand/uL 7.7 8.0 8.6  Hemoglobin 11.7 - 15.5 g/dL 9.8(L) 10.0(L) 10.0(L)  Hematocrit 35.0 - 45.0 % 29.8(L) 31.2(L) 29.2(L)  Platelets 140 - 400 Thousand/uL 284 270 251.0    No results found for: VD25OH  Clinical ASCVD:  The ASCVD Risk score Mikey Bussing DC Jr., et al., 2013) failed to calculate for the following reasons:   The 2013 ASCVD risk score is only valid for ages 89 to 25    Social History   Tobacco Use  Smoking Status Former Smoker  . Packs/day: 1.00  . Years: 18.00  . Pack years: 18.00  . Types: Cigarettes  . Quit date: 01/13/1974  . Years since quitting: 46.8  Smokeless Tobacco Never Used   BP Readings from Last 3 Encounters:  09/06/20 (!) 164/66  09/03/20 132/72  08/09/20 (!) 150/80   Pulse Readings from Last 3 Encounters:  09/06/20 (!) 104  09/03/20 96  08/09/20 87   Wt Readings from Last 3 Encounters:  09/06/20 145 lb 6.4 oz (66 kg)  09/03/20 144 lb 12.8 oz (65.7 kg)  08/09/20 147 lb (66.7 kg)    Assessment: Review of patient past medical history, allergies, medications, health status, including review of consultants reports, laboratory and other test data, was performed as part of comprehensive evaluation and provision of chronic care management services.   SDOH:  (Social Determinants of Health) assessments and interventions performed: Yes   CCM Care Plan  Allergies  Allergen Reactions  . Tiazac [Diltiazem Hcl] Other (See Comments)    Ankle edema    Medications Reviewed Today    Reviewed by Madelin Rear, [facility] (Pharmacist) on 11/19/20 at 1359  Med List Status: <None>  Medication Order Taking? Sig Documenting Provider Last Dose Status Informant  albuterol (VENTOLIN HFA) 108 (90 Base) MCG/ACT inhaler [id number] Yes Inhale 2 puffs into the lungs every 6 (six) hours as needed for wheezing or shortness of  breath. Leamon Arnt, MD Taking Active   amLODipine (NORVASC) 10 MG tablet [id number] Yes TAKE 1 TABLET BY MOUTH  DAILY Leamon Arnt, MD Taking Active   budesonide-formoterol [facility]) 80-4.5 MCG/ACT inhaler [id number] Yes Inhale 2 puffs into the lungs 2 (two) times daily. Leamon Arnt, MD Taking Active   Calcium Carbonate-Vitamin D (CALCIUM 600 + D PO) [id number] Yes Take 1 tablet by mouth daily. [provider] Taking Active Self  carvedilol (COREG) 25 MG tablet [id number] Yes Take 1 tablet (25 mg total) by mouth 2 (two) times daily. Nahser, Wonda Cheng, MD Taking Active   diclofenac Sodium (VOLTAREN) 1 % GEL [id number]  Apply 4 g topically 4 (four) times daily as needed (right knee pain). Leamon Arnt, MD  Active   doxazosin (CARDURA) 4 MG tablet [id number] Yes TAKE 1 TABLET BY MOUTH AT  BEDTIME Nahser, Wonda Cheng, MD Taking Active  fenofibrate 160 MG tablet [id number] Yes TAKE 1 TABLET BY MOUTH  DAILY Leamon Arnt, MD Taking Active   ferrous sulfate 325 (65 FE) MG tablet [id number] Yes Take 1 tablet (325 mg total) by mouth daily. Leamon Arnt, MD Taking Active   furosemide (LASIX) 40 MG tablet [id number] Yes TAKE 1 TABLET BY MOUTH DAILY Leamon Arnt, MD Taking Active   losartan (COZAAR) 100 MG tablet [id number] Yes Take 1 tablet (100 mg total) by mouth daily. Nahser, Wonda Cheng, MD Taking Active   metFORMIN (GLUCOPHAGE) 1000 MG tablet [id number]  TAKE 1 TABLET BY MOUTH  TWICE DAILY WITH A MEAL Leamon Arnt, MD  Active   Multiple Vitamin (MULTIVITAMIN) tablet [id number]  Take 1 tablet by mouth daily. [provider]  Active   simvastatin (ZOCOR) 20 MG tablet [id number] Yes TAKE 1 TABLET BY MOUTH AT  BEDTIME Leamon Arnt, MD Taking Active   vitamin A 8000 UNIT capsule [id number]  Take 8,000 Units by mouth daily.  [provider]  Active Self  vitamin B-12 (CYANOCOBALAMIN) 500 MCG tablet [id number]  Take 500 mcg by mouth daily. [provider]  Active    vitamin C (ASCORBIC ACID) 500 MG tablet [id number]  Take 500 mg by mouth daily. [provider]  Active Self          Patient Active Problem List   Diagnosis Date Noted  . Hepatic steatosis 06/05/2020  . Primary osteoarthritis of right knee 01/29/2020  . Hypertensive heart disease with heart failure ([place]) 09/12/2019  . History of right breast cancer 06/05/2019  . Osteopenia 04/28/2019  . PAD (peripheral artery disease) ([place]) - asymptomatic 04/27/2019  . Anemia of chronic disease 01/10/2019  . CKD (chronic kidney disease) stage 3, GFR 30-59 ml/min (HCC) 01/06/2019  . Leg edema 12/08/2018  . Chronic heart failure with preserved ejection fraction ([place]) 09/05/2018  . H/O: upper GI bleed 09/05/2018  . Iron deficiency anemia 08/22/2018  . Acute on chronic diastolic CHF (congestive heart failure) ([place]) 07/31/2018  . Type 2 diabetes mellitus ([place]) 07/26/2018  . Malignant hypertension 07/26/2018  . Hyponatremia 11/20/2017  . Hearing loss 08/27/2015  . Pulmonary hypertension ([place]) 08/27/2015  . Asthma 08/27/2015  . Hyperlipidemia 01/23/2011    Immunization History  Administered Date(s) Administered  . Fluad Quad(high Dose 65+) 04/10/2019, 06/05/2020  . Influenza, High Dose Seasonal PF 04/20/2016, 04/20/2017, 04/15/2018, 06/20/2019  . Pneumococcal Conjugate-13 04/10/2019  . Pneumococcal Polysaccharide-23 05/09/2007  . Td 03/30/2007  . Zoster Recombinat (Shingrix) 08/16/2020    Conditions to be addressed/monitored: PAD, CAD, CHF, CKD, Osteopenia, OA, T2DM, HLD, Asthma, hx of GI bleed  There are no care plans that you recently modified to display for this patient.  Current Barriers:  . Potential GI side effects with metform  Pharmacist Clinical Goal(s):  Marland Kitchen Patient will contact provider office for questions/concerns as evidenced notation of same in electronic health record through collaboration with PharmD and provider.   Interventions: . 1:1 collaboration with Leamon Arnt, MD regarding development and update of comprehensive plan of care as evidenced by provider attestation and co-signature. Inter-disciplinary care team collaboration (see longitudinal plan of care). Comprehensive medication review performed; medication list updated in electronic medical record . Metformin dose decrease to metformin XR 500 mg twice daily due to potential GI symptoms and GFR <45.   Hypertension (BP goal <130/80) -Controlled HFpEF, CKD III -Current treatment: . Furosemide 40 mg once daily (Dr Jonni Sanger) . Carvedilol 25 mg twice daily (Dr  Nasher) . Doxazosin 4 mg once daily (Dr Cathie Olden) . Losartan 100 mg once daily (Dr Cathie Olden) -Current home readings: at goal -Denies hypotensive/hypertensive symptoms -Educated on BP goals and benefits of medications for prevention of heart attack, stroke and kidney damage; Symptoms of hypotension and importance of maintaining adequate hydration; -Counseled to monitor BP at home as directed, document, and provide log at future appointments -Recommended to continue current medication  Hyperlipidemia: (LDL goal < 70) -Controlled -Current treatment: . Simvastatin 20 mg once daily  . Fenofibrate 160 mg once daily  -Educated on Benefits of statin for ASCVD risk reduction; -Recommended to continue current medication  Diabetes (A1c goal <7%) -Controlled -GFR ~43 07/2020, ckd stage III.  -Reports ongoing diarrhea, at times not being able to make it to the bathroom. Feels this has been persistent following metformin dose increase >1 yr ago.  -Current medications: Marland Kitchen Metformin IR 1000 mg twice daily  -Medications previously tried:   -Current home glucose readings . FBG low 100s  -Denies hypoglycemic/hyperglycemic symptoms -Current meal patterns: fruit, pinto beans, potatoes, greens. Drinks: diet dr pepper, lots of water. -Current exercise: active with daily activities inside and outdoor activites  -Educated on Complications of diabetes  including kidney damage, retinal damage, and cardiovascular disease; Benefits of routine self-monitoring of blood sugar; -Metformin dose decrease to metformin XR 500 mg twice daily due to potential GI symptoms and GFR <45.  -Emphasized the need for dietary changes - agreeable to starting sent to preferred pharmacy [facility] Could consider addition or switch to sglt2i such as farxiga in the future as well due to hfpef ckd dm.  Asthma (Goal: control symptoms and prevent exacerbations) -Controlled -Symptoms have not been persistent -Current treatment  . Symbicort 80-4.5 mcg/act 2 puffs into the lungs 2 (two) times daily. . Ventolin 2 puffs as needed every 6  hours -Reviewed appropriate inhaler use - no problems noted -Recommended to continue current medication  Osteopenia (Goal maintain bone density) -Controlled -Last DEXA Scan: 2020 -Patient is not a candidate for pharmacologic treatment -Current treatment  . Calcium-vitamin D3 supplementation  -Recommend weight-bearing and muscle strengthening exercises for building and maintaining bone density. -Recommended to continue current medication  Patient Goals/Self-Care Activities . Patient will:  - target a minimum of 150 minutes of moderate intensity exercise weekly  Medication Assistance: None required.  Patient affirms current coverage meets needs.  Patient's preferred pharmacy is:  [facility], [address], [address] [place], [place] [address] Phone: [phone] Fax: [phone]  Follow Up:  Patient agrees to Care Plan and Follow-up. Plan: will review formulary for metformin XR switch Future Appointments  Date Time Provider [place]  12/05/2020  8:30 AM Leamon Arnt, MD [facility]  01/27/2021  3:30 PM [facility] CCM PHARMACIST [facility]  06/10/2021  9:00 AM Leamon Arnt, MD [facility]    Madelin Rear, Pharm.D., BCGP Clinical Pharmacist [place] [phone]

## 2020-11-20 NOTE — Patient Instructions (Signed)
Ms. Willcox,  Thank you for talking with me today. I have included our care plan/goals in the following pages.   Please review and call me at [phone] with any questions.  Thanks! Ellin Mayhew, Pharm.D., BCGP Clinical Pharmacist [facility] [place] [phone] There are no care plans to display for this patient.    The patient was given the following information about Chronic Care Management services today, agreed to services, and gave verbal consent: 1. CCM service includes personalized support from designated clinical staff supervised by the primary care provider, including individualized plan of care and coordination with other care providers 2. 24/7 contact phone numbers for assistance for urgent and routine care needs. 3. Service will only be billed when office clinical staff spend 20 minutes or more in a month to coordinate care. 4. Only one practitioner may furnish and bill the service in a calendar month. 5.The patient may stop CCM services at any time (effective at the end of the month) by phone call to the office staff. 6. The patient will be responsible for cost sharing (co-pay) of up to 20% of the service fee (after annual deductible is met). Patient agreed to services and consent obtained.  The patient verbalized understanding of instructions provided today and agreed to receive a MyChart copy of patient instruction and/or educational materials. Telephone follow up appointment with pharmacy team member scheduled for: See next appointment with "Care Management Staff" under "What's Next" below.

## 2020-11-21 MED ORDER — METFORMIN HCL ER 500 MG PO TB24: ORAL_TABLET | ORAL | 1 refills | Status: DC

## 2020-12-05 ENCOUNTER — Encounter: Payer: Self-pay | Admitting: Family Medicine

## 2020-12-05 ENCOUNTER — Other Ambulatory Visit: Payer: Self-pay

## 2020-12-05 ENCOUNTER — Ambulatory Visit (INDEPENDENT_AMBULATORY_CARE_PROVIDER_SITE_OTHER): Payer: Medicare Other | Admitting: Family Medicine

## 2020-12-05 VITALS — BP 132/68 | HR 78 | Temp 98.0°F | Wt 146.0 lb

## 2020-12-05 DIAGNOSIS — I10 Essential (primary) hypertension: Secondary | ICD-10-CM

## 2020-12-05 DIAGNOSIS — I739 Peripheral vascular disease, unspecified: Secondary | ICD-10-CM

## 2020-12-05 DIAGNOSIS — N1831 Chronic kidney disease, stage 3a: Secondary | ICD-10-CM

## 2020-12-05 DIAGNOSIS — I5032 Chronic diastolic (congestive) heart failure: Secondary | ICD-10-CM | POA: Diagnosis not present

## 2020-12-05 DIAGNOSIS — I272 Pulmonary hypertension, unspecified: Secondary | ICD-10-CM | POA: Diagnosis not present

## 2020-12-05 DIAGNOSIS — E1121 Type 2 diabetes mellitus with diabetic nephropathy: Secondary | ICD-10-CM | POA: Diagnosis not present

## 2020-12-05 DIAGNOSIS — E871 Hypo-osmolality and hyponatremia: Secondary | ICD-10-CM

## 2020-12-05 LAB — POCT GLYCOSYLATED HEMOGLOBIN (HGB A1C): Hemoglobin A1C: 6.2 % — AB (ref 4.0–5.6)

## 2020-12-05 MED ORDER — METFORMIN HCL ER 500 MG PO TB24: 500.0000 mg | ORAL_TABLET | Freq: Every day | ORAL | 1 refills | Status: DC

## 2020-12-05 NOTE — Progress Notes (Signed)
Subjective  CC:  Chief Complaint  Patient presents with  . Diabetes  . Hypertension  . Congestive Heart Failure    HPI: Carla Foster is a 80 y.o. female who presents to the office today for follow up of diabetes and problems listed above in the chief complaint.   Diabetes follow up: Her diabetic control is reported as Unchanged.  I reviewed her recent chronic care management notes.  She disclosed that she was having diarrhea from her metformin 1000 twice daily.  Since, she was changed to metformin 500 XR twice daily.  She is tolerating this much better.  She checks her sugars every morning and they average 110-1 20.  However, she describes symptoms of possible low blood sugars very rarely.  Awakens in the middle the night with sweats.  She has not checked her sugars during these times.  She denies chest pain, palpitations or lightheadedness.  She notes that that has only happened when she has not eaten a lot.  Her daughter says she stays very busy and sometimes will skip meals.  She denies problems with her appetite. She denies exertional CP or SOB. She denies foot sores or paresthesias.   Hypertension and heart failure: Reviewed recent cardiology notes.  Blood pressure had been well controlled and she is euvolemic.  No medication changes have been made.  She reports she continues to be stable.  Mild lower extremity edema persists.  No shortness of breath or dyspnea on exertion.  No chest pain.  Has hyponatremia mild possibly related to volume overload.  This has been stable.  Chronic kidney disease: Stage III.  Mild lower extremity edema.  No nausea or vomiting.  She is not on an SGLT2 inhibitor.  She does take an ARB.  Chronic pulmonary hypertension managed by cardiology  PAD: Denies claudication  Wt Readings from Last 3 Encounters:  12/05/20 146 lb (66.2 kg)  09/06/20 145 lb 6.4 oz (66 kg)  09/03/20 144 lb 12.8 oz (65.7 kg)    BP Readings from Last 3 Encounters:  12/05/20 132/68   09/06/20 (!) 164/66  09/03/20 132/72    Assessment  1. Stage 3a chronic kidney disease ([place])   2. Malignant hypertension   3. Type 2 diabetes mellitus with diabetic nephropathy, without long-term current use of insulin ([place])   4. Pulmonary hypertension ([place])   5. Hyponatremia   6. PAD (peripheral artery disease) (HCC) Chronic  7. Chronic heart failure with preserved ejection fraction ([place])      Plan   Diabetes is currently very well controlled. Given possible hypoglycemic episodes, will decrease metformin XR further to 500 mg once daily.  Goal A1c 6.5-7 due to age and comorbidities.  Defer changing to SGLT2 inhibitor today given possible side effects although it would be good for chronic kidney disease prevention and heart failure.  We will recheck A1c in 3 months.  Patient may have resolved her diabetes.  Can discuss with cardiology if would like to start [place] for CHF indications.  Hypertension is well controlled today.  Volume status is stable.  Continue Lasix.    Hyponatremia: Recheck next visit.  Follow up: 3 months. Orders Placed This Encounter  Procedures  . POCT glycosylated hemoglobin (Hb A1C)   Meds ordered this encounter  Medications  . metFORMIN (GLUCOPHAGE XR) 500 MG 24 hr tablet    Sig: Take 1 tablet (500 mg total) by mouth daily with breakfast.    Dispense:  180 tablet    Refill:  1      Immunization History  Administered Date(s) Administered  . Fluad Quad(high Dose 65+) 04/10/2019, 06/05/2020  . Influenza, High Dose Seasonal PF 04/20/2016, 04/20/2017, 04/15/2018, 06/20/2019  . Pneumococcal Conjugate-13 04/10/2019  . Pneumococcal Polysaccharide-23 05/09/2007  . Td 03/30/2007  . Zoster Recombinat (Shingrix) 08/16/2020    Diabetes Related Lab Review: Lab Results  Component Value Date   HGBA1C 6.2 (A) 12/05/2020   HGBA1C 5.9 (A) 09/03/2020   HGBA1C 6.5 (H) 06/05/2020    No results found for: Derl Barrow Lab Results  Component Value  Date   CREATININE 1.22 (H) 08/09/2020   BUN 31 (H) 08/09/2020   NA 132 (L) 08/09/2020   K 4.7 08/09/2020   CL 98 08/09/2020   CO2 24 08/09/2020   Lab Results  Component Value Date   CHOL 138 06/05/2020   CHOL 139 04/10/2019   CHOL 116 07/26/2018   Lab Results  Component Value Date   HDL 59 06/05/2020   HDL 61.10 04/10/2019   HDL 59 07/26/2018   Lab Results  Component Value Date   LDLCALC 59 06/05/2020   LDLCALC 62 04/10/2019   LDLCALC 47 07/26/2018   Lab Results  Component Value Date   TRIG 114 06/05/2020   TRIG 77.0 04/10/2019   TRIG 49 07/26/2018   Lab Results  Component Value Date   CHOLHDL 2.3 06/05/2020   CHOLHDL 2 04/10/2019   CHOLHDL 2.0 07/26/2018   No results found for: LDLDIRECT The ASCVD Risk score Mikey Bussing DC Jr., et al., 2013) failed to calculate for the following reasons:   The 2013 ASCVD risk score is only valid for ages 83 to 51 I have reviewed the [place], Fam and Soc history. Patient Active Problem List   Diagnosis Date Noted  . Hypertensive heart disease with heart failure ([place]) 09/12/2019    Priority: High  . History of right breast cancer 06/05/2019    Priority: High  . PAD (peripheral artery disease) ([place]) - asymptomatic 04/27/2019    Priority: High    [employer] ABI's by in home nurse assessment: Bilateral abnl: betweein 0.6 and 0.7 Can't tolerate aspirin   . Anemia of chronic disease 01/10/2019    Priority: High    12/2018 labs and 05/2020   . CKD (chronic kidney disease) stage 3, GFR 30-59 ml/min (HCC) 01/06/2019    Priority: High  . Chronic heart failure with preserved ejection fraction ([place]) 09/05/2018    Priority: High  . H/O: upper GI bleed 09/05/2018    Priority: High    11/2017, Peptic ulcer by EGD, Dr. Carlean Purl; hospitalized   . Type 2 diabetes mellitus ([place]) 07/26/2018    Priority: High  . Malignant hypertension 07/26/2018    Priority: High  . Pulmonary hypertension ([place]) 08/27/2015    Priority: High  . Hyperlipidemia  01/23/2011    Priority: High  . Hepatic steatosis 06/05/2020    Priority: Medium  . Primary osteoarthritis of right knee 01/29/2020    Priority: Medium  . Osteopenia 04/28/2019    Priority: Medium    dexa 04/2019 T = - 2.3 L/spine lowest. Recheck 2 years.    . Leg edema 12/08/2018    Priority: Medium  . Iron deficiency anemia 08/22/2018    Priority: Medium    Due to peptic ulcer/UGI bleed hospitalized 11/2017; EGD by Dr. Carlean Purl at that time.    . Acute on chronic diastolic CHF (congestive heart failure) ([place]) 07/31/2018    Priority: Medium  . Hearing loss 08/27/2015  Priority: Medium  . Asthma 08/27/2015    Priority: Medium  . Hyponatremia 11/20/2017    Social History: Patient  reports that she quit smoking about 46 years ago. Her smoking use included cigarettes. She has a 18.00 pack-year smoking history. She has never used smokeless tobacco. She reports that she does not drink alcohol and does not use drugs.  Review of Systems: Ophthalmic: negative for eye pain, loss of vision or double vision Cardiovascular: negative for chest pain Respiratory: negative for SOB or persistent cough Gastrointestinal: negative for abdominal pain Genitourinary: negative for dysuria or gross hematuria MSK: negative for foot lesions Neurologic: negative for weakness or gait disturbance  Objective  Vitals: BP 132/68   Pulse 78   Temp 98 F (36.7 C)   Wt 146 lb (66.2 kg)   SpO2 97%   BMI 23.57 kg/m  General: well appearing, no acute distress, appears well Psych:  Alert and oriented, normal mood and affect HEENT:  Normocephalic, atraumatic, moist mucous membranes, supple neck  Cardiovascular:  Nl S1 and S2, RRR without murmur, gallop or rub.  Trace edema bilaterally Respiratory:  Good breath sounds bilaterally, CTAB with normal effort, no rales  Diabetic education: ongoing education regarding chronic disease management for diabetes was given today. We continue to reinforce the ABC's of  diabetic management: A1c (<7 or 8 dependent upon patient), tight blood pressure control, and cholesterol management with goal LDL < 100 minimally. We discuss diet strategies, exercise recommendations, medication options and possible side effects. At each visit, we review recommended immunizations and preventive care recommendations for diabetics and stress that good diabetic control can prevent other problems. See below for this patient's data.    Commons side effects, risks, benefits, and alternatives for medications and treatment plan prescribed today were discussed, and the patient expressed understanding of the given instructions. Patient is instructed to call or message via MyChart if he/she has any questions or concerns regarding our treatment plan. No barriers to understanding were identified. We discussed Red Flag symptoms and signs in detail. Patient expressed understanding regarding what to do in case of urgent or emergency type symptoms.   Medication list was reconciled, printed and provided to the patient in AVS. Patient instructions and summary information was reviewed with the patient as documented in the AVS. This note was prepared with assistance of Dragon voice recognition software. Occasional wrong-word or sound-a-like substitutions may have occurred due to the inherent limitations of voice recognition software  This visit occurred during the SARS-CoV-2 public health emergency.  Safety protocols were in place, including screening questions prior to the visit, additional usage of staff PPE, and extensive cleaning of exam room while observing appropriate contact time as indicated for disinfecting solutions.

## 2020-12-05 NOTE — Patient Instructions (Signed)
Please return in 3 months for recheck diabetes. I will get lab work at that visit.   We will lower the metfromin XR to 500mg  once a day in the mornings.   If you have any questions or concerns, please don't hesitate to send me a message via MyChart or call the office at [phone]. Thank you for visiting with Carla Foster today! It's our pleasure caring for you.

## 2021-01-04 ENCOUNTER — Other Ambulatory Visit: Payer: Self-pay | Admitting: Family Medicine

## 2021-01-04 DIAGNOSIS — E785 Hyperlipidemia, unspecified: Secondary | ICD-10-CM

## 2021-01-24 ENCOUNTER — Telehealth: Payer: Self-pay

## 2021-01-24 MED ORDER — METFORMIN HCL ER 500 MG PO TB24: 500.0000 mg | ORAL_TABLET | Freq: Every day | ORAL | 1 refills | Status: DC

## 2021-01-24 NOTE — Chronic Care Management (AMB) (Signed)
    Chronic Care Management Pharmacy Assistant   Name: Carla Foster  MRN: [id number] DOB: 05-27-1941  Reason for Encounter: CPP Visit Reschedule  Recent office visits:  12/05/20- Billey Chang, MD- chronic conditions addressed, metformin decreased from 500 mg twice daily to 500 mg once daily, follow up 3 months   Medications: Outpatient Encounter Medications as of 01/24/2021  Medication Sig   albuterol (VENTOLIN HFA) 108 (90 Base) MCG/ACT inhaler Inhale 2 puffs into the lungs every 6 (six) hours as needed for wheezing or shortness of breath.   amLODipine (NORVASC) 10 MG tablet TAKE 1 TABLET BY MOUTH  DAILY   budesonide-formoterol (SYMBICORT) 80-4.5 MCG/ACT inhaler Inhale 2 puffs into the lungs 2 (two) times daily.   Calcium Carbonate-Vitamin D (CALCIUM 600 + D PO) Take 1 tablet by mouth daily.   carvedilol (COREG) 25 MG tablet Take 1 tablet (25 mg total) by mouth 2 (two) times daily.   diclofenac Sodium (VOLTAREN) 1 % GEL Apply 4 g topically 4 (four) times daily as needed (right knee pain).   doxazosin (CARDURA) 4 MG tablet TAKE 1 TABLET BY MOUTH AT  BEDTIME   fenofibrate 160 MG tablet TAKE 1 TABLET BY MOUTH  DAILY   ferrous sulfate 325 (65 FE) MG tablet Take 1 tablet (325 mg total) by mouth daily.   furosemide (LASIX) 40 MG tablet TAKE 1 TABLET BY MOUTH DAILY   losartan (COZAAR) 100 MG tablet Take 1 tablet (100 mg total) by mouth daily.   metFORMIN (GLUCOPHAGE XR) 500 MG 24 hr tablet Take 1 tablet (500 mg total) by mouth daily with breakfast.   Multiple Vitamin (MULTIVITAMIN) tablet Take 1 tablet by mouth daily.   simvastatin (ZOCOR) 20 MG tablet TAKE 1 TABLET BY MOUTH AT  BEDTIME   vitamin A 8000 UNIT capsule Take 8,000 Units by mouth daily.    vitamin B-12 (CYANOCOBALAMIN) 500 MCG tablet Take 500 mcg by mouth daily.   vitamin C (ASCORBIC ACID) 500 MG tablet Take 500 mg by mouth daily.   No facility-administered encounter medications on file as of 01/24/2021.   I spoke with Carla Foster  this morning and discovered she has still been taken metformin twice daily even though it was decreased by PCP 05/19. A care team member was contacted so they would be able to discuss the correct way this should taken with her.   Original appointment: 07/11 at 3:30 pm Phone visit was rescheduled for 08/08 at 12 pm   [facility] [employer], CMA

## 2021-01-24 NOTE — Telephone Encounter (Signed)
Carla Foster called on behalf of patient stating patient is confused on her dosage of her metformin and has been taking 1000MG  when the rx states 500MG  daily can we call  and explain to patient.

## 2021-01-24 NOTE — Telephone Encounter (Signed)
Spoke to pt told her calling in regards to her Metformin Rx. Carla Foster they called saying you are taking two tablets a day. Told pt you should only be taking one tablet 500 mg daily. Pt said but the bottle says twice a day. Told pt when you were here last Dr. Jonni Sanger decreased to one tablet daily. Pt verbalized understanding. Asked pt if she needed a new Rx sent to mail order pharmacy. Told pt will send new Rx. Pt verbalized understanding.

## 2021-01-27 ENCOUNTER — Telehealth: Payer: Medicare Other

## 2021-02-13 ENCOUNTER — Telehealth: Payer: Self-pay | Admitting: Pharmacist

## 2021-02-13 NOTE — Chronic Care Management (AMB) (Signed)
    Chronic Care Management Pharmacy Assistant   Name: Carla Foster  MRN: [id number] DOB: [birth year]   Reason for Encounter: Chart Review    Recent office visits:  None  Recent consult visits:  None  Hospital visits:  None in previous 6 months  Medications: Outpatient Encounter Medications as of 02/13/2021  Medication Sig   albuterol (VENTOLIN HFA) 108 (90 Base) MCG/ACT inhaler Inhale 2 puffs into the lungs every 6 (six) hours as needed for wheezing or shortness of breath.   amLODipine (NORVASC) 10 MG tablet TAKE 1 TABLET BY MOUTH  DAILY   budesonide-formoterol (SYMBICORT) 80-4.5 MCG/ACT inhaler Inhale 2 puffs into the lungs 2 (two) times daily.   Calcium Carbonate-Vitamin D (CALCIUM 600 + D PO) Take 1 tablet by mouth daily.   carvedilol (COREG) 25 MG tablet Take 1 tablet (25 mg total) by mouth 2 (two) times daily.   diclofenac Sodium (VOLTAREN) 1 % GEL Apply 4 g topically 4 (four) times daily as needed (right knee pain).   doxazosin (CARDURA) 4 MG tablet TAKE 1 TABLET BY MOUTH AT  BEDTIME   fenofibrate 160 MG tablet TAKE 1 TABLET BY MOUTH  DAILY   ferrous sulfate 325 (65 FE) MG tablet Take 1 tablet (325 mg total) by mouth daily.   furosemide (LASIX) 40 MG tablet TAKE 1 TABLET BY MOUTH DAILY   losartan (COZAAR) 100 MG tablet Take 1 tablet (100 mg total) by mouth daily.   metFORMIN (GLUCOPHAGE XR) 500 MG 24 hr tablet Take 1 tablet (500 mg total) by mouth daily with breakfast.   Multiple Vitamin (MULTIVITAMIN) tablet Take 1 tablet by mouth daily.   simvastatin (ZOCOR) 20 MG tablet TAKE 1 TABLET BY MOUTH AT  BEDTIME   vitamin A 8000 UNIT capsule Take 8,000 Units by mouth daily.    vitamin B-12 (CYANOCOBALAMIN) 500 MCG tablet Take 500 mcg by mouth daily.   vitamin C (ASCORBIC ACID) 500 MG tablet Take 500 mg by mouth daily.   No facility-administered encounter medications on file as of 02/13/2021.    Reviewed chart for medication changes ahead of medication coordination  call.  No OVs, Consults, or hospital visits since last care coordination call/Pharmacist visit.  No medication changes indicated.  Patient has a scheduled follow up with the clinical pharmacist.  No gaps in adherence identified.  Future Appointments  Date Time Provider [place]  02/24/2021 12:00 PM [facility] CCM PHARMACIST [facility]  04/01/2021  8:30 AM Leamon Arnt, MD [facility]  06/10/2021  9:00 AM Leamon Arnt, MD [facility]    April D Calhoun, [place] Pharmacist Assistant [phone]

## 2021-02-24 ENCOUNTER — Ambulatory Visit (INDEPENDENT_AMBULATORY_CARE_PROVIDER_SITE_OTHER): Payer: Medicare Other | Admitting: Pharmacist

## 2021-02-24 DIAGNOSIS — J4530 Mild persistent asthma, uncomplicated: Secondary | ICD-10-CM

## 2021-02-24 DIAGNOSIS — E1121 Type 2 diabetes mellitus with diabetic nephropathy: Secondary | ICD-10-CM | POA: Diagnosis not present

## 2021-02-24 NOTE — Patient Instructions (Addendum)
Visit Information   Goals Addressed             This Visit's Progress    Monitor and Manage My Blood Sugar-Diabetes Type 2       Timeframe:  Long-Range Goal Priority:  High Start Date:  02/24/21                           Expected End Date:  08/27/21                     Follow Up Date 06/18/21    - check blood sugar at prescribed times - check blood sugar before and after exercise    Why is this important?   Checking your blood sugar at home helps to keep it from getting very high or very low.  Writing the results in a diary or log helps the doctor know how to care for you.  Your blood sugar log should have the time, date and the results.  Also, write down the amount of insulin or other medicine that you take.  Other information, like what you ate, exercise done and how you were feeling, will also be helpful.     Notes:        Patient Care Plan: General Pharmacy (Adult)     Problem Identified: HTN, DM, Asthma   Priority: High  Onset Date: 02/24/2021     Long-Range Goal: Patient-Specific Goal   Start Date: 02/24/2021  Expected End Date: 08/27/2021  This Visit's Progress: On track  Priority: High  Note:   Current Barriers:  None at this time  Pharmacist Clinical Goal(s):  Patient will contact provider office for questions/concerns as evidenced notation of same in electronic health record through collaboration with PharmD and provider.   Interventions: 1:1 collaboration with Leamon Arnt, MD regarding development and update of comprehensive plan of care as evidenced by provider attestation and co-signature. Inter-disciplinary care team collaboration (see longitudinal plan of care). Comprehensive medication review performed; medication list updated in electronic medical record   Hypertension (BP goal <130/80) -Controlled HFpEF, CKD III -Current treatment: Furosemide 40 mg once daily (Dr Jonni Sanger) Carvedilol 25 mg twice daily (Dr Cathie Olden) Doxazosin 4 mg once daily (Dr  Cathie Olden) Losartan 100 mg once daily (Dr Cathie Olden) -Current home readings: at goal -Denies hypotensive/hypertensive symptoms -Educated on BP goals and benefits of medications for prevention of heart attack, stroke and kidney damage; Symptoms of hypotension and importance of maintaining adequate hydration; -Counseled to monitor BP at home as directed, document, and provide log at future appointments -Recommended to continue current medication  Hyperlipidemia: (LDL goal < 70) -Controlled -Current treatment: Simvastatin 20 mg once daily  Fenofibrate 160 mg once daily  -Educated on Benefits of statin for ASCVD risk reduction; -Recommended to continue current medication  Diabetes (A1c goal <7%) -Controlled -GFR ~43 07/2020, ckd stage III.  -Reports ongoing diarrhea, at times not being able to make it to the bathroom. Feels this has been persistent following metformin dose increase >1 yr ago.  -Current medications: Metformin XR '500mg'$  daily -Medications previously tried:   -Current home glucose readings FBG low 100s  -Denies hypoglycemic/hyperglycemic symptoms -Current meal patterns: fruit, pinto beans, potatoes, greens. Drinks: diet dr pepper, lots of water. -Current exercise: active with daily activities inside and outdoor activites  -Educated on Complications of diabetes including kidney damage, retinal damage, and cardiovascular disease; Benefits of routine self-monitoring of blood sugar;  Could consider addition or  switch to sglt2i such as farxiga in the future as well due to hfpef ckd dm.  Update 02/24/21 110 fasting sugar this morning, they have all been around this for the most part She denies any more episodes of hypoglycemia and GI symptoms have resolved since switching to the XR formulation of metformin.  Continue current meds for now - PCP discussing Jardiance with cardio.  Asthma (Goal: control symptoms and prevent exacerbations) -Controlled -Symptoms have not been  persistent -Current treatment  Symbicort 80-4.5 mcg/act 2 puffs into the lungs 2 (two) times daily. Ventolin 2 puffs as needed every 6  hours -Reviewed appropriate inhaler use - no problems noted -Recommended to continue current medication  Update 02/24/21 Patient reports some SOB recently.  She is only using her Symbicort prn.  Counseled on using it daily as maintenance.  Patient is agreeable to plan.  Osteopenia (Goal maintain bone density) -Controlled -Last DEXA Scan: 2020 -Patient is not a candidate for pharmacologic treatment -Current treatment  Calcium-vitamin D3 supplementation  -Recommend weight-bearing and muscle strengthening exercises for building and maintaining bone density. -Recommended to continue current medication  Patient Goals/Self-Care Activities Patient will:  - target a minimum of 150 minutes of moderate intensity exercise weekly       Patient verbalizes understanding of instructions provided today and agrees to view in [place].  Telephone follow up appointment with pharmacy team member scheduled for: 6 months  Edythe Clarity, [place]

## 2021-02-24 NOTE — Progress Notes (Signed)
Chronic Care Management Pharmacy Note  02/24/2021 Name:  Carla Foster MRN:  [id number] DOB:  Jun 08, 1941  Subjective: Carla Foster is an 80 y.o. year old female who is a primary patient of Leamon Arnt, MD.  The CCM team was consulted for assistance with disease management and care coordination needs.  Accompanied by daughter Audrea Muscat.    Engaged with patient by telephone for follow up visit in response to provider referral for pharmacy case management and/or care coordination services.   Consent to Services:  The patient was given the following information about Chronic Care Management services today, agreed to services, and gave verbal consent: 1. CCM service includes personalized support from designated clinical staff supervised by the primary care provider, including individualized plan of care and coordination with other care providers 2. 24/7 contact phone numbers for assistance for urgent and routine care needs. 3. Service will only be billed when office clinical staff spend 20 minutes or more in a month to coordinate care. 4. Only one practitioner may furnish and bill the service in a calendar month. 5.The patient may stop CCM services at any time (effective at the end of the month) by phone call to the office staff. 6. The patient will be responsible for cost sharing (co-pay) of up to 20% of the service fee (after annual deductible is met). Patient agreed to services and consent obtained.  Patient Care Team: Leamon Arnt, MD as PCP - General (Family Medicine) Nahser, Wonda Cheng, MD as PCP - Cardiology (Cardiology) Juanita Craver, MD as Consulting Physician (Gastroenterology) Mammography, [facility] (Diagnostic Radiology) Mauro Kaufmann, RN as Oncology Nurse Navigator Rockwell Germany, RN as Oncology Nurse Navigator Magrinat, Virgie Dad, MD as Consulting Physician (Oncology) Eppie Gibson, MD as Consulting Physician (Radiation Oncology) Rutherford Guys, MD as Consulting Physician  (Ophthalmology) Vicie Mutters, MD as Consulting Physician (Otolaryngology) Edythe Clarity, [facility] (Pharmacist)  Recent office visits:  12/05/20 Jonni Sanger) - Metformin XR 567m decreased to ONCE daily due to some hypoglycemia. 09/03/20-Billey Chang MD- chronic conditions addressed, follow up 3 months for BP and kidney 08/09/20- CBilley Chang MD- seen for CHF, shingrix vaccine administered, follow up as scheduled 07/26/20- CBilley Chang MD- Seen for  Leg swelling/ chronic conditions,  increased lasix to twice daily for 1 week and recheck with labs in 2 weeks, started symbicort bid 06/05/20-  CBilley Chang MD- chronic conditions addressed, follow up 3 months   Recent consult visits:  09/06/20- Nahser, PWonda Cheng MD( Cardiology)- follow up visit for htn, short course prednisone  x5 DS follow up 1 yr    Hospital visits:  None in previous 6 months  Objective:  Lab Results  Component Value Date   CREATININE 1.22 (H) 08/09/2020   CREATININE 1.19 07/26/2020   CREATININE 1.26 (H) 06/20/2020   Lab Results  Component Value Date   HGBA1C 6.2 (A) 12/05/2020   HGBA1C 5.9 (A) 09/03/2020   HGBA1C 6.5 (H) 06/05/2020   HGBA1C 6.6 (A) 01/29/2020   HGBA1C 8.1 (A) 10/27/2019    Last diabetic Eye exam:  Lab Results  Component Value Date/Time   HMDIABEYEEXA No Retinopathy 07/31/2019 12:00 AM    Last diabetic Foot exam: No results found for: HMDIABFOOTEX      Component Value Date/Time   CHOL 138 06/05/2020 1005   CHOL 131 01/11/2018 1011   TRIG 114 06/05/2020 1005   HDL 59 06/05/2020 1005   HDL 58 01/11/2018 1011   CHOLHDL 2.3 06/05/2020 1005  VLDL 15.4 04/10/2019 1134   LDLCALC 59 06/05/2020 1005    Hepatic Function Latest Ref Rng & Units 07/26/2020 06/05/2020 06/07/2019  Total Protein 6.0 - 8.3 g/dL 6.9 6.4 7.1  Albumin 3.5 - 5.2 g/dL 3.9 - 3.8  AST 0 - 37 U/L 20 18 20   ALT 0 - 35 U/L 8 10 12   Alk Phosphatase 39 - 117 U/L 23(L) - 27(L)  Total Bilirubin 0.2 - 1.2 mg/dL 0.5 0.4 0.2(L)   Bilirubin, Direct 0.0 - 0.2 mg/dL - - -    Lab Results  Component Value Date/Time   TSH 2.91 06/05/2020 10:05 AM   TSH 2.35 04/10/2019 11:34 AM    CBC Latest Ref Rng & Units 06/05/2020 06/07/2019 04/10/2019  WBC 3.8 - 10.8 Thousand/uL 7.7 8.0 8.6  Hemoglobin 11.7 - 15.5 g/dL 9.8(L) 10.0(L) 10.0(L)  Hematocrit 35.0 - 45.0 % 29.8(L) 31.2(L) 29.2(L)  Platelets 140 - 400 Thousand/uL 284 270 251.0    No results found for: VD25OH  Clinical ASCVD:  The ASCVD Risk score Mikey Bussing DC Jr., et al., 2013) failed to calculate for the following reasons:   The 2013 ASCVD risk score is only valid for ages 29 to 49    Social History   Tobacco Use  Smoking Status Former   Packs/day: 1.00   Years: 18.00   Pack years: 18.00   Types: Cigarettes   Quit date: 01/13/1974   Years since quitting: 47.1  Smokeless Tobacco Never   BP Readings from Last 3 Encounters:  12/05/20 132/68  09/06/20 (!) 164/66  09/03/20 132/72   Pulse Readings from Last 3 Encounters:  12/05/20 78  09/06/20 (!) 104  09/03/20 96   Wt Readings from Last 3 Encounters:  12/05/20 146 lb (66.2 kg)  09/06/20 145 lb 6.4 oz (66 kg)  09/03/20 144 lb 12.8 oz (65.7 kg)    Assessment: Review of patient past medical history, allergies, medications, health status, including review of consultants reports, laboratory and other test data, was performed as part of comprehensive evaluation and provision of chronic care management services.   SDOH:  (Social Determinants of Health) assessments and interventions performed: Yes   CCM Care Plan  Allergies  Allergen Reactions   Tiazac [Diltiazem Hcl] Other (See Comments)    Ankle edema    Medications Reviewed Today     Reviewed by Edythe Clarity, [facility] (Pharmacist) on 02/24/21 at 1513  Med List Status: <None>   Medication Order Taking? Sig Documenting Provider Last Dose Status Informant  albuterol (VENTOLIN HFA) 108 (90 Base) MCG/ACT inhaler [id number] No Inhale 2 puffs into the  lungs every 6 (six) hours as needed for wheezing or shortness of breath. Leamon Arnt, MD Taking Active   amLODipine (NORVASC) 10 MG tablet [id number] No TAKE 1 TABLET BY MOUTH  DAILY Leamon Arnt, MD Taking Active   budesonide-formoterol [facility]) 80-4.5 MCG/ACT inhaler [id number] No Inhale 2 puffs into the lungs 2 (two) times daily. Leamon Arnt, MD Taking Active   Calcium Carbonate-Vitamin D (CALCIUM 600 + D PO) [id number] No Take 1 tablet by mouth daily. [provider] Taking Active Self  carvedilol (COREG) 25 MG tablet [id number] No Take 1 tablet (25 mg total) by mouth 2 (two) times daily. Nahser, Wonda Cheng, MD Taking Active   diclofenac Sodium (VOLTAREN) 1 % GEL [id number] No Apply 4 g topically 4 (four) times daily as needed (right knee pain). Leamon Arnt, MD Taking Active   doxazosin (CARDURA) 4 MG tablet [id number] No  TAKE 1 TABLET BY MOUTH AT  BEDTIME Nahser, Wonda Cheng, MD Taking Active   fenofibrate 160 MG tablet [id number] No TAKE 1 TABLET BY MOUTH  DAILY Leamon Arnt, MD Taking Active   ferrous sulfate 325 (65 FE) MG tablet [id number] No Take 1 tablet (325 mg total) by mouth daily. Leamon Arnt, MD Taking Active   furosemide (LASIX) 40 MG tablet [id number] No TAKE 1 TABLET BY MOUTH DAILY Leamon Arnt, MD Taking Active   losartan (COZAAR) 100 MG tablet [id number] No Take 1 tablet (100 mg total) by mouth daily. Nahser, Wonda Cheng, MD Taking Active   metFORMIN (GLUCOPHAGE XR) 500 MG 24 hr tablet [id number]  Take 1 tablet (500 mg total) by mouth daily with breakfast. Leamon Arnt, MD  Active   Multiple Vitamin (MULTIVITAMIN) tablet [id number] No Take 1 tablet by mouth daily. [provider] Taking Active   simvastatin (ZOCOR) 20 MG tablet [id number]  TAKE 1 TABLET BY MOUTH AT  BEDTIME Leamon Arnt, MD  Active   vitamin A 8000 UNIT capsule [id number] No Take 8,000 Units by mouth daily.  [provider] Taking Active Self  vitamin B-12 (CYANOCOBALAMIN) 500  MCG tablet [id number] No Take 500 mcg by mouth daily. [provider] Taking Active   vitamin C (ASCORBIC ACID) 500 MG tablet [id number] No Take 500 mg by mouth daily. [provider] Taking Active Self            Patient Active Problem List   Diagnosis Date Noted   Hepatic steatosis 06/05/2020   Primary osteoarthritis of right knee 01/29/2020   Hypertensive heart disease with heart failure ([place]) 09/12/2019   History of right breast cancer 06/05/2019   Osteopenia 04/28/2019   PAD (peripheral artery disease) ([place]) - asymptomatic 04/27/2019   Anemia of chronic disease 01/10/2019   CKD (chronic kidney disease) stage 3, GFR 30-59 ml/min (HCC) 01/06/2019   Leg edema 12/08/2018   Chronic heart failure with preserved ejection fraction ([place]) 09/05/2018   H/O: upper GI bleed 09/05/2018   Iron deficiency anemia 08/22/2018   Acute on chronic diastolic CHF (congestive heart failure) ([place]) 07/31/2018   Type 2 diabetes mellitus ([place]) 07/26/2018   Malignant hypertension 07/26/2018   Hyponatremia 11/20/2017   Hearing loss 08/27/2015   Pulmonary hypertension ([place]) 08/27/2015   Asthma 08/27/2015   Hyperlipidemia 01/23/2011    Immunization History  Administered Date(s) Administered   Fluad Quad(high Dose 65+) 04/10/2019, 06/05/2020   Influenza, High Dose Seasonal PF 04/20/2016, 04/20/2017, 04/15/2018, 06/20/2019   Pneumococcal Conjugate-13 04/10/2019   Pneumococcal Polysaccharide-23 05/09/2007   Td 03/30/2007   Zoster Recombinat (Shingrix) 08/16/2020    Conditions to be addressed/monitored: PAD, CAD, CHF, CKD, Osteopenia, OA, T2DM, HLD, Asthma, hx of GI bleed  Care Plan : General Pharmacy (Adult)  Updates made by Edythe Clarity, RPH since 02/24/2021 12:00 AM     Problem: HTN, DM, Asthma   Priority: High  Onset Date: 02/24/2021     Long-Range Goal: Patient-Specific Goal   Start Date: 02/24/2021  Expected End Date: 08/27/2021  This Visit's Progress: On track   Priority: High  Note:   Current Barriers:  None at this time  Pharmacist Clinical Goal(s):  Patient will contact provider office for questions/concerns as evidenced notation of same in electronic health record through collaboration with PharmD and provider.   Interventions: 1:1 collaboration with Leamon Arnt, MD regarding development and update of comprehensive plan of care as evidenced by provider attestation and co-signature.  Inter-disciplinary care team collaboration (see longitudinal plan of care). Comprehensive medication review performed; medication list updated in electronic medical record   Hypertension (BP goal <130/80) -Controlled HFpEF, CKD III -Current treatment: Furosemide 40 mg once daily (Dr Jonni Sanger) Carvedilol 25 mg twice daily (Dr Cathie Olden) Doxazosin 4 mg once daily (Dr Cathie Olden) Losartan 100 mg once daily (Dr Cathie Olden) -Current home readings: at goal -Denies hypotensive/hypertensive symptoms -Educated on BP goals and benefits of medications for prevention of heart attack, stroke and kidney damage; Symptoms of hypotension and importance of maintaining adequate hydration; -Counseled to monitor BP at home as directed, document, and provide log at future appointments -Recommended to continue current medication  Hyperlipidemia: (LDL goal < 70) -Controlled -Current treatment: Simvastatin 20 mg once daily  Fenofibrate 160 mg once daily  -Educated on Benefits of statin for ASCVD risk reduction; -Recommended to continue current medication  Diabetes (A1c goal <7%) -Controlled -GFR ~43 07/2020, ckd stage III.  -Reports ongoing diarrhea, at times not being able to make it to the bathroom. Feels this has been persistent following metformin dose increase >1 yr ago.  -Current medications: Metformin XR 542m daily -Medications previously tried:   -Current home glucose readings FBG low 100s  -Denies hypoglycemic/hyperglycemic symptoms -Current meal patterns: fruit, pinto  beans, potatoes, greens. Drinks: diet dr pepper, lots of water. -Current exercise: active with daily activities inside and outdoor activites  -Educated on Complications of diabetes including kidney damage, retinal damage, and cardiovascular disease; Benefits of routine self-monitoring of blood sugar;  Could consider addition or switch to sglt2i such as farxiga in the future as well due to hfpef ckd dm.  Update 02/24/21 110 fasting sugar this morning, they have all been around this for the most part She denies any more episodes of hypoglycemia and GI symptoms have resolved since switching to the XR formulation of metformin.  Continue current meds for now - PCP discussing Jardiance with cardio.  Asthma (Goal: control symptoms and prevent exacerbations) -Controlled -Symptoms have not been persistent -Current treatment  Symbicort 80-4.5 mcg/act 2 puffs into the lungs 2 (two) times daily. Ventolin 2 puffs as needed every 6  hours -Reviewed appropriate inhaler use - no problems noted -Recommended to continue current medication  Update 02/24/21 Patient reports some SOB recently.  She is only using her Symbicort prn.  Counseled on using it daily as maintenance.  Patient is agreeable to plan.  Osteopenia (Goal maintain bone density) -Controlled -Last DEXA Scan: 2020 -Patient is not a candidate for pharmacologic treatment -Current treatment  Calcium-vitamin D3 supplementation  -Recommend weight-bearing and muscle strengthening exercises for building and maintaining bone density. -Recommended to continue current medication  Patient Goals/Self-Care Activities Patient will:  - target a minimum of 150 minutes of moderate intensity exercise weekly      Medication Assistance: None required.  Patient affirms current coverage meets needs.  Patient's preferred pharmacy is:  [facility] [address] [address]Phone: [phone]: [phone] Follow Up:  Patient agrees to Care Plan and Follow-up. Plan: FU in 6 months Future Appointments  Date Time Provider [place] 04/01/2021  8:30 AM ALeamon Arnt MD [facility] 06/10/2021  9:00 AM ALeamon Arnt MD [facility] [place] PharmD Clinical Pharmacist  ([phone]

## 2021-03-22 ENCOUNTER — Other Ambulatory Visit: Payer: Self-pay | Admitting: Family Medicine

## 2021-04-01 ENCOUNTER — Other Ambulatory Visit: Payer: Self-pay

## 2021-04-01 ENCOUNTER — Ambulatory Visit (INDEPENDENT_AMBULATORY_CARE_PROVIDER_SITE_OTHER): Payer: Medicare Other | Admitting: Family Medicine

## 2021-04-01 ENCOUNTER — Encounter: Payer: Self-pay | Admitting: Family Medicine

## 2021-04-01 VITALS — BP 140/76 | HR 62 | Temp 97.8°F | Ht 66.0 in | Wt 144.2 lb

## 2021-04-01 DIAGNOSIS — Z23 Encounter for immunization: Secondary | ICD-10-CM | POA: Diagnosis not present

## 2021-04-01 DIAGNOSIS — I272 Pulmonary hypertension, unspecified: Secondary | ICD-10-CM

## 2021-04-01 DIAGNOSIS — R32 Unspecified urinary incontinence: Secondary | ICD-10-CM

## 2021-04-01 DIAGNOSIS — I5032 Chronic diastolic (congestive) heart failure: Secondary | ICD-10-CM | POA: Diagnosis not present

## 2021-04-01 DIAGNOSIS — E871 Hypo-osmolality and hyponatremia: Secondary | ICD-10-CM | POA: Diagnosis not present

## 2021-04-01 DIAGNOSIS — I10 Essential (primary) hypertension: Secondary | ICD-10-CM

## 2021-04-01 DIAGNOSIS — E1121 Type 2 diabetes mellitus with diabetic nephropathy: Secondary | ICD-10-CM

## 2021-04-01 DIAGNOSIS — N1831 Chronic kidney disease, stage 3a: Secondary | ICD-10-CM

## 2021-04-01 LAB — POCT GLYCOSYLATED HEMOGLOBIN (HGB A1C): Hemoglobin A1C: 6.5 % — AB (ref 4.0–5.6)

## 2021-04-01 MED ORDER — ALBUTEROL SULFATE HFA 108 (90 BASE) MCG/ACT IN AERS: 2.0000 | INHALATION_SPRAY | Freq: Four times a day (QID) | RESPIRATORY_TRACT | 3 refills | Status: DC | PRN

## 2021-04-01 MED ORDER — AMLODIPINE BESYLATE 10 MG PO TABS: 5.0000 mg | ORAL_TABLET | Freq: Every day | ORAL | 3 refills | Status: DC

## 2021-04-01 MED ORDER — BUDESONIDE-FORMOTEROL FUMARATE 80-4.5 MCG/ACT IN AERO: 2.0000 | INHALATION_SPRAY | Freq: Two times a day (BID) | RESPIRATORY_TRACT | 3 refills | Status: DC

## 2021-04-01 MED ORDER — DAPAGLIFLOZIN PROPANEDIOL 5 MG PO TABS: 5.0000 mg | ORAL_TABLET | Freq: Every day | ORAL | 11 refills | Status: DC

## 2021-04-01 NOTE — Progress Notes (Signed)
Subjective  CC:  Chief Complaint  Patient presents with   Hypertension   Hyperlipidemia   Diabetes    HPI: Carla Foster is a 80 y.o. female who presents to the office today for follow up of diabetes and problems listed above in the chief complaint.  Diabetes follow up: Her diabetic control is reported as Unchanged. On metformin xr 500 daily. No lows. Tolerating well.  She denies exertional CP or SOB or symptomatic hypoglycemia. She denies foot sores or paresthesias.  CHF, diastolic: on lasix. Denies leg edema or sob. Sees card annually she reports. No chest pain. Has chronic hyponatremia Malignant htn managed by cards: multiple meds: amlodipine, cardura, ARB, lasix. Has been well controlled but reports she felt "funny" yesterday and checked her bp: 118/32! She sat down. Today is fine. She keeps active, always moving.  CKD: not on sglt2-I. Good candidate. Urinary incont: new complaint. Has large volume urine leakage about once a week. Unlcear reasons, she attributes to lasix.   Wt Readings from Last 3 Encounters:  04/01/21 144 lb 3.2 oz (65.4 kg)  12/05/20 146 lb (66.2 kg)  09/06/20 145 lb 6.4 oz (66 kg)    BP Readings from Last 3 Encounters:  04/01/21 140/76  12/05/20 132/68  09/06/20 (!) 164/66    Assessment  1. Type 2 diabetes mellitus with diabetic nephropathy, without long-term current use of insulin ([place])   2. Malignant hypertension   3. Chronic heart failure with preserved ejection fraction (HCC)   4. Stage 3a chronic kidney disease ([place])   5. Urinary incontinence, unspecified type   6. Pulmonary hypertension ([place])   7. Hyponatremia      Plan  Diabetes is currently very well controlled. Will stop metformin and starrt farxiga 10. Education given. Refer to urology given incontinence. Needs evaluation. Will cut back on amlodipine to 5 and monitor hoem bps. May also be able to cut back on lasix - will monitor blood work. Flu shottoday BP is borderlilne high today.  Decrease amlodipine to 5 today as noted above. Farxiag for renal protection, diabetes and CHF exacerbation prevention.  CHF: euvolemic. Monitoring sodium,renal fucntion CKD: see above. Check labs today. Avoid nephrotoxins.  Pulmonary HTN: stable clinically.   Follow up: 3 mo for cpe/f/u. Orders Placed This Encounter  Procedures   Comprehensive metabolic panel   CBC with Differential/Platelet   Ambulatory referral to Urology   POCT HgB A1C   Meds ordered this encounter  Medications   dapagliflozin propanediol (FARXIGA) 5 MG TABS tablet    Sig: Take 1 tablet (5 mg total) by mouth daily before breakfast.    Dispense:  30 tablet    Refill:  11   amLODipine (NORVASC) 10 MG tablet    Sig: Take 0.5 tablets (5 mg total) by mouth daily.    Dispense:  90 tablet    Refill:  3    Requesting 1 year supply      Immunization History  Administered Date(s) Administered   Fluad Quad(high Dose 65+) 04/10/2019, 06/05/2020   Influenza, High Dose Seasonal PF 04/20/2016, 04/20/2017, 04/15/2018, 06/20/2019   Pneumococcal Conjugate-13 04/10/2019   Pneumococcal Polysaccharide-23 05/09/2007   Td 03/30/2007   Zoster Recombinat (Shingrix) 08/16/2020    Diabetes Related Lab Review: Lab Results  Component Value Date   HGBA1C 6.5 (A) 04/01/2021   HGBA1C 6.2 (A) 12/05/2020   HGBA1C 5.9 (A) 09/03/2020    No results found for: MICROALBUR, MALB24HUR Lab Results  Component Value Date   CREATININE 1.22 (  H) 08/09/2020   BUN 31 (H) 08/09/2020   NA 132 (L) 08/09/2020   K 4.7 08/09/2020   CL 98 08/09/2020   CO2 24 08/09/2020   Lab Results  Component Value Date   CHOL 138 06/05/2020   CHOL 139 04/10/2019   CHOL 116 07/26/2018   Lab Results  Component Value Date   HDL 59 06/05/2020   HDL 61.10 04/10/2019   HDL 59 07/26/2018   Lab Results  Component Value Date   LDLCALC 59 06/05/2020   LDLCALC 62 04/10/2019   LDLCALC 47 07/26/2018   Lab Results  Component Value Date   TRIG 114 06/05/2020    TRIG 77.0 04/10/2019   TRIG 49 07/26/2018   Lab Results  Component Value Date   CHOLHDL 2.3 06/05/2020   CHOLHDL 2 04/10/2019   CHOLHDL 2.0 07/26/2018   No results found for: LDLDIRECT The ASCVD Risk score (Arnett DK, et al., 2019) failed to calculate for the following reasons:   The 2019 ASCVD risk score is only valid for ages 46 to 5 I have reviewed the [place], Fam and Soc history. Patient Active Problem List   Diagnosis Date Noted   Hypertensive heart disease with heart failure ([place]) 09/12/2019    Priority: High   History of right breast cancer 06/05/2019    Priority: High   PAD (peripheral artery disease) ([place]) - asymptomatic 04/27/2019    Priority: High    [employer]lo ABI's by in home nurse assessment: Bilateral abnl: betweein 0.6 and 0.7 Can't tolerate aspirin    Anemia of chronic disease 01/10/2019    Priority: High    12/2018 labs and 05/2020    CKD (chronic kidney disease) stage 3, GFR 30-59 ml/min (HCC) 01/06/2019    Priority: High   Chronic heart failure with preserved ejection fraction ([place]) 09/05/2018    Priority: High   H/O: upper GI bleed 09/05/2018    Priority: High    11/2017, Peptic ulcer by EGD, Dr. Carlean Purl; hospitalized    Type 2 diabetes mellitus ([place]) 07/26/2018    Priority: High   Malignant hypertension 07/26/2018    Priority: High   Pulmonary hypertension ([place]) 08/27/2015    Priority: High   Hyperlipidemia 01/23/2011    Priority: High   Hepatic steatosis 06/05/2020    Priority: Medium   Primary osteoarthritis of right knee 01/29/2020    Priority: Medium   Osteopenia 04/28/2019    Priority: Medium    dexa 04/2019 T = - 2.3 L/spine lowest. Recheck 2 years.     Leg edema 12/08/2018    Priority: Medium   Iron deficiency anemia 08/22/2018    Priority: Medium    Due to peptic ulcer/UGI bleed hospitalized 11/2017; EGD by Dr. Carlean Purl at that time.     Acute on chronic diastolic CHF (congestive heart failure) ([place]) 07/31/2018    Priority: Medium    Hearing loss 08/27/2015    Priority: Medium   Asthma 08/27/2015    Priority: Medium   Hyponatremia 11/20/2017    Social History: Patient  reports that she quit smoking about 47 years ago. Her smoking use included cigarettes. She has a 18.00 pack-year smoking history. She has never used smokeless tobacco. She reports that she does not drink alcohol and does not use drugs.  Review of Systems: Ophthalmic: negative for eye pain, loss of vision or double vision Cardiovascular: negative for chest pain Respiratory: negative for SOB or persistent cough Gastrointestinal: negative for abdominal pain Genitourinary: negative for dysuria or gross hematuria  MSK: negative for foot lesions Neurologic: negative for weakness or gait disturbance  Objective  Vitals: BP 140/76   Pulse 62   Temp 97.8 F (36.6 C) (Temporal)   Ht '5\' 6"'$  (1.676 m)   Wt 144 lb 3.2 oz (65.4 kg)   SpO2 97%   BMI 23.27 kg/m  General: well appearing, no acute distress  Psych:  Alert and oriented, normal mood and affect Cardiovascular:  Nl S1 and [id number] w/ systolic murmur Tr left ankle edeam Respiratory:  Good breath sounds bilaterally, CTAB with normal effort, no rales Gastrointestinal: normal BS, soft, nontender Skin:  Warm, no rashes Neurologic:   Mental status is normal. normal gait Foot exam: no erythema, pallor, or cyanosis visible nl proprioception and sensation to monofilament testing bilaterally, +2 distal pulses bilaterally    Diabetic education: ongoing education regarding chronic disease management for diabetes was given today. We continue to reinforce the ABC's of diabetic management: A1c (<7 or 8 dependent upon patient), tight blood pressure control, and cholesterol management with goal LDL < 100 minimally. We discuss diet strategies, exercise recommendations, medication options and possible side effects. At each visit, we review recommended immunizations and preventive care recommendations for diabetics and  stress that good diabetic control can prevent other problems. See below for this patient's data.   Commons side effects, risks, benefits, and alternatives for medications and treatment plan prescribed today were discussed, and the patient expressed understanding of the given instructions. Patient is instructed to call or message via MyChart if he/she has any questions or concerns regarding our treatment plan. No barriers to understanding were identified. We discussed Red Flag symptoms and signs in detail. Patient expressed understanding regarding what to do in case of urgent or emergency type symptoms.  Medication list was reconciled, printed and provided to the patient in AVS. Patient instructions and summary information was reviewed with the patient as documented in the AVS. This note was prepared with assistance of Dragon voice recognition software. Occasional wrong-word or sound-a-like substitutions may have occurred due to the inherent limitations of voice recognition software  This visit occurred during the SARS-CoV-2 public health emergency.  Safety protocols were in place, including screening questions prior to the visit, additional usage of staff PPE, and extensive cleaning of exam room while observing appropriate contact time as indicated for disinfecting solutions.

## 2021-04-01 NOTE — Patient Instructions (Signed)
Please return in 3 months for your annual complete physical; please come fasting.   Please start farxiga '10mg'$  daily (your daughter is taking this as well).  Cut the amlodipin tablet in half to decrease to '5mg'$  daily. Keep a daily log of your blood pressures.   We will call you with an appointment with a bladder doctor to help with your incontinence.  Stop the metformin.  If you have any questions or concerns, please don't hesitate to send me a message via MyChart or call the office at [phone]. Thank you for visiting with [place] today! It's our pleasure caring for you.

## 2021-04-03 ENCOUNTER — Telehealth: Payer: Self-pay

## 2021-04-03 ENCOUNTER — Other Ambulatory Visit: Payer: Self-pay

## 2021-04-03 MED ORDER — DAPAGLIFLOZIN PROPANEDIOL 5 MG PO TABS: 5.0000 mg | ORAL_TABLET | Freq: Every day | ORAL | 11 refills | Status: DC

## 2021-04-03 NOTE — Telephone Encounter (Signed)
Spoke with patient regarding [facility], gave verbal understanding

## 2021-04-03 NOTE — Telephone Encounter (Signed)
Pt's daughter called stating that Dr Jonni Sanger called in [place] yesterday. Stanton Kidney stated that they want it call into [facility] that is on file. Please Advise.

## 2021-04-07 ENCOUNTER — Other Ambulatory Visit: Payer: Self-pay | Admitting: Family Medicine

## 2021-04-08 NOTE — Telephone Encounter (Signed)
Please advise, medication was discontinued

## 2021-04-23 ENCOUNTER — Other Ambulatory Visit: Payer: Self-pay

## 2021-04-23 MED ORDER — METFORMIN HCL 500 MG PO TABS: 500.0000 mg | ORAL_TABLET | Freq: Every day | ORAL | 3 refills | Status: DC

## 2021-04-23 NOTE — Telephone Encounter (Signed)
Pt called stating that she never received her Farixga in the mail. Can the prescription be resent? Please Advise.

## 2021-04-23 NOTE — Telephone Encounter (Signed)
Spoke with Audelia Acton, Pharmacist at [facility] medication was not due for a refill until yesterday. Medication is set to be shipped out today will make patient aware. Medication has a copay

## 2021-04-23 NOTE — Telephone Encounter (Signed)
Patient states that because Carla Foster has a $200 copayment she is not able to pay that, wants to know if she can just continue taking her Metformin instead

## 2021-04-23 NOTE — Telephone Encounter (Signed)
Spoke with patient, will start back taking Metformin. Will cancel Wilder Glade order

## 2021-05-01 ENCOUNTER — Telehealth: Payer: Self-pay

## 2021-05-01 NOTE — Telephone Encounter (Signed)
Pt's daughter called regarding Carla Foster's medication. Carla Foster stated that they received a message that her prescription for TUL has been denied. Pt would like a call to discuss what is going on. Please Advise.

## 2021-05-01 NOTE — Telephone Encounter (Signed)
Spoke with pharmacy, they show no records of a medication being denied.  Is there an alternative to the [place] patient may try?

## 2021-05-01 NOTE — Telephone Encounter (Signed)
Spoke with patients daughter, will reach out to pharmacy to see what the medication issue is. Patients daughter stated that she cannot afford the [place] also.

## 2021-05-01 NOTE — Telephone Encounter (Signed)
Attempted to reach patients daughter, voicemail full. Will try again

## 2021-05-06 ENCOUNTER — Other Ambulatory Visit: Payer: Self-pay

## 2021-05-06 NOTE — Telephone Encounter (Signed)
Spoke with patients daughter, gave a verbal understanding

## 2021-05-07 ENCOUNTER — Telehealth: Payer: Self-pay

## 2021-05-07 NOTE — Telephone Encounter (Signed)
Error

## 2021-05-08 ENCOUNTER — Telehealth (INDEPENDENT_AMBULATORY_CARE_PROVIDER_SITE_OTHER): Payer: Medicare Other | Admitting: Family Medicine

## 2021-05-08 ENCOUNTER — Encounter: Payer: Self-pay | Admitting: Family Medicine

## 2021-05-08 DIAGNOSIS — E1121 Type 2 diabetes mellitus with diabetic nephropathy: Secondary | ICD-10-CM

## 2021-05-08 DIAGNOSIS — J0100 Acute maxillary sinusitis, unspecified: Secondary | ICD-10-CM | POA: Diagnosis not present

## 2021-05-08 MED ORDER — AMOXICILLIN 875 MG PO TABS: 875.0000 mg | ORAL_TABLET | Freq: Two times a day (BID) | ORAL | 0 refills | Status: AC

## 2021-05-08 NOTE — Progress Notes (Signed)
Virtual Visit via Video Note  Subjective  CC:  Chief Complaint  Patient presents with   Sinus Problem   Nasal Congestion    On going for a fever   Cough    Green mucus     I connected with Carla Foster on 05/08/21 at 11:30 AM EDT by a video enabled telemedicine application and verified that I am speaking with the correct person using two identifiers. Location patient: Home Location provider: [facility] [place], Office Persons participating in the virtual visit: LEO WEYANDT, Leamon Arnt, MD [place]  I discussed the limitations of evaluation and management by telemedicine and the availability of in person appointments. The patient expressed understanding and agreed to proceed. HPI: Carla Foster is a 80 y.o. female who was contacted today to address the problems listed above in the chief complaint. 80 year old female with multiple comorbidities including heart failure, pulmonary hypertension, diabetes with nephropathy presents with her daughter due to 7 to 10-day history of nasal and sinus congestion with sinus pain and pressure, thick drainage, postnasal drainage causing cough without fever, shortness of breath, [place] exertion.  Appetite is down but she is eating breakfast well.  She is taking fluids.  No nausea or vomiting or diarrhea.  No chest pain or abdominal pain.  She is not vaccinated against COVID.  She has not had a COVID test.  No other sick family members.  She has had history of sinus infections and feels this feels similar.  She is taking Mucinex  Assessment  1. Acute non-recurrent maxillary sinusitis   2. Type 2 diabetes mellitus with diabetic nephropathy, without long-term current use of insulin (HCC)      Plan  Acute sinusitis: Recommend testing for COVID.  Given clinical presentation, age and comorbidities will cover with amoxicillin 875 twice daily for 10 days.  Continue supportive care.  Monitor breathing.  Patient will  come in for visit if symptoms or not improving.  Recommend holding metformin until appetite returns to normal.  Push fluids. I discussed the assessment and treatment plan with the patient. The patient was provided an opportunity to ask questions and all were answered. The patient agreed with the plan and demonstrated an understanding of the instructions.   The patient was advised to call back or seek an in-person evaluation if the symptoms worsen or if the condition fails to improve as anticipated. Follow up: As scheduled 06/10/2021  Meds ordered this encounter  Medications   amoxicillin (AMOXIL) 875 MG tablet    Sig: Take 1 tablet (875 mg total) by mouth 2 (two) times daily for 10 days.    Dispense:  20 tablet    Refill:  0      I reviewed the patients updated PMH, FH, and SocHx.    Patient Active Problem List   Diagnosis Date Noted   Hypertensive heart disease with heart failure ([place]) 09/12/2019    Priority: 1.   History of right breast cancer 06/05/2019    Priority: 1.   PAD (peripheral artery disease) ([place]) - asymptomatic 04/27/2019    Priority: 1.   Anemia of chronic disease 01/10/2019    Priority: 1.   CKD (chronic kidney disease) stage 3, GFR 30-59 ml/min (HCC) 01/06/2019    Priority: 1.   Chronic heart failure with preserved ejection fraction ([place]) 09/05/2018    Priority: 1.   H/O: upper GI bleed 09/05/2018    Priority: 1.  Type 2 diabetes mellitus ([place]) 07/26/2018    Priority: 1.   Malignant hypertension 07/26/2018    Priority: 1.   Pulmonary hypertension ([place]) 08/27/2015    Priority: 1.   Hyperlipidemia 01/23/2011    Priority: 1.   Hepatic steatosis 06/05/2020    Priority: 2.   Primary osteoarthritis of right knee 01/29/2020    Priority: 2.   Osteopenia 04/28/2019    Priority: 2.   Leg edema 12/08/2018    Priority: 2.   Iron deficiency anemia 08/22/2018    Priority: 2.   Acute on chronic diastolic CHF (congestive heart failure) ([place]) 07/31/2018     Priority: 2.   Hearing loss 08/27/2015    Priority: 2.   Asthma 08/27/2015    Priority: 2.   Hyponatremia 11/20/2017   Current Meds  Medication Sig   albuterol (VENTOLIN HFA) 108 (90 Base) MCG/ACT inhaler Inhale 2 puffs into the lungs every 6 (six) hours as needed for wheezing or shortness of breath.   amLODipine (NORVASC) 10 MG tablet Take 0.5 tablets (5 mg total) by mouth daily.   amoxicillin (AMOXIL) 875 MG tablet Take 1 tablet (875 mg total) by mouth 2 (two) times daily for 10 days.   budesonide-formoterol (SYMBICORT) 80-4.5 MCG/ACT inhaler Inhale 2 puffs into the lungs 2 (two) times daily.   Calcium Carbonate-Vitamin D (CALCIUM 600 + D PO) Take 1 tablet by mouth daily.   carvedilol (COREG) 25 MG tablet Take 1 tablet (25 mg total) by mouth 2 (two) times daily.   diclofenac Sodium (VOLTAREN) 1 % GEL Apply 4 g topically 4 (four) times daily as needed (right knee pain).   doxazosin (CARDURA) 4 MG tablet TAKE 1 TABLET BY MOUTH AT  BEDTIME   fenofibrate 160 MG tablet TAKE 1 TABLET BY MOUTH  DAILY   ferrous sulfate 325 (65 FE) MG tablet Take 1 tablet (325 mg total) by mouth daily.   furosemide (LASIX) 40 MG tablet TAKE 1 TABLET BY MOUTH DAILY   losartan (COZAAR) 100 MG tablet Take 1 tablet (100 mg total) by mouth daily.   metFORMIN (GLUCOPHAGE) 500 MG tablet Take 1 tablet (500 mg total) by mouth daily with breakfast.   Multiple Vitamin (MULTIVITAMIN) tablet Take 1 tablet by mouth daily.   simvastatin (ZOCOR) 20 MG tablet TAKE 1 TABLET BY MOUTH AT  BEDTIME   vitamin A 8000 UNIT capsule Take 8,000 Units by mouth daily.    vitamin B-12 (CYANOCOBALAMIN) 500 MCG tablet Take 500 mcg by mouth daily.   vitamin C (ASCORBIC ACID) 500 MG tablet Take 500 mg by mouth daily.    Allergies: Patient is allergic to tiazac [diltiazem hcl]. Family History: Patient family history includes Asthma in her daughter; Atrial fibrillation in her mother; Cancer in her daughter; Emphysema in her father; Hypertension  in her mother; Stroke in her mother. Social History:  Patient  reports that she quit smoking about 47 years ago. Her smoking use included cigarettes. She has a 18.00 pack-year smoking history. She has never used smokeless tobacco. She reports that she does not drink alcohol and does not use drugs.  Review of Systems: Constitutional: Negative for fever malaise or anorexia Cardiovascular: negative for chest pain Respiratory: negative for SOB or persistent cough Gastrointestinal: negative for abdominal pain  OBJECTIVE Vitals: There were no vitals taken for this visit. General: no acute distress , A&Ox3, appears nontoxic, normal speech, no respiratory distress.  Points to right maxillary sinus when describing pain.  Leamon Arnt, MD

## 2021-05-12 ENCOUNTER — Telehealth: Payer: Self-pay

## 2021-05-12 MED ORDER — METFORMIN HCL ER 500 MG PO TB24: 500.0000 mg | ORAL_TABLET | Freq: Every day | ORAL | 3 refills | Status: DC

## 2021-05-12 NOTE — Telephone Encounter (Signed)
Medication has been sent in. Patient is now taking metformin XR

## 2021-05-12 NOTE — Telephone Encounter (Signed)
Pt called regarding Metformin. Pt stated that [facility] is needing a new prescription written for Metformin. Karol would like a call back. Please Advise.

## 2021-05-14 DIAGNOSIS — R350 Frequency of micturition: Secondary | ICD-10-CM | POA: Diagnosis not present

## 2021-06-02 ENCOUNTER — Other Ambulatory Visit: Payer: Self-pay | Admitting: Family Medicine

## 2021-06-10 ENCOUNTER — Ambulatory Visit (INDEPENDENT_AMBULATORY_CARE_PROVIDER_SITE_OTHER): Payer: Medicare Other | Admitting: Family Medicine

## 2021-06-10 ENCOUNTER — Other Ambulatory Visit: Payer: Self-pay

## 2021-06-10 ENCOUNTER — Encounter: Payer: Self-pay | Admitting: Family Medicine

## 2021-06-10 VITALS — BP 138/60 | HR 69 | Temp 98.1°F | Ht 66.0 in | Wt 147.0 lb

## 2021-06-10 DIAGNOSIS — E1169 Type 2 diabetes mellitus with other specified complication: Secondary | ICD-10-CM

## 2021-06-10 DIAGNOSIS — Z780 Asymptomatic menopausal state: Secondary | ICD-10-CM | POA: Diagnosis not present

## 2021-06-10 DIAGNOSIS — E1121 Type 2 diabetes mellitus with diabetic nephropathy: Secondary | ICD-10-CM

## 2021-06-10 DIAGNOSIS — Z0000 Encounter for general adult medical examination without abnormal findings: Secondary | ICD-10-CM | POA: Diagnosis not present

## 2021-06-10 DIAGNOSIS — E782 Mixed hyperlipidemia: Secondary | ICD-10-CM

## 2021-06-10 DIAGNOSIS — I110 Hypertensive heart disease with heart failure: Secondary | ICD-10-CM

## 2021-06-10 DIAGNOSIS — M8580 Other specified disorders of bone density and structure, unspecified site: Secondary | ICD-10-CM

## 2021-06-10 DIAGNOSIS — D638 Anemia in other chronic diseases classified elsewhere: Secondary | ICD-10-CM | POA: Diagnosis not present

## 2021-06-10 DIAGNOSIS — I5032 Chronic diastolic (congestive) heart failure: Secondary | ICD-10-CM

## 2021-06-10 DIAGNOSIS — N3946 Mixed incontinence: Secondary | ICD-10-CM

## 2021-06-10 DIAGNOSIS — N1831 Chronic kidney disease, stage 3a: Secondary | ICD-10-CM | POA: Diagnosis not present

## 2021-06-10 LAB — COMPREHENSIVE METABOLIC PANEL
ALT: 10 U/L (ref 0–35)
AST: 25 U/L (ref 0–37)
Albumin: 4 g/dL (ref 3.5–5.2)
Alkaline Phosphatase: 20 U/L — ABNORMAL LOW (ref 39–117)
BUN: 44 mg/dL — ABNORMAL HIGH (ref 6–23)
CO2: 28 mEq/L (ref 19–32)
Calcium: 10.1 mg/dL (ref 8.4–10.5)
Chloride: 100 mEq/L (ref 96–112)
Creatinine, Ser: 1.67 mg/dL — ABNORMAL HIGH (ref 0.40–1.20)
GFR: 28.44 mL/min — ABNORMAL LOW (ref >60.00)
Glucose, Bld: 50 mg/dL — ABNORMAL LOW (ref 70–99)
Potassium: 4.5 mEq/L (ref 3.5–5.1)
Sodium: 135 mEq/L (ref 135–145)
Total Bilirubin: 0.4 mg/dL (ref 0.2–1.2)
Total Protein: 7.1 g/dL (ref 6.0–8.3)

## 2021-06-10 LAB — CBC WITH DIFFERENTIAL/PLATELET
Basophils Absolute: 0.1 10*3/uL (ref 0.0–0.1)
Basophils Relative: 1.1 % (ref 0.0–3.0)
Eosinophils Absolute: 0.2 10*3/uL (ref 0.0–0.7)
Eosinophils Relative: 3.2 % (ref 0.0–5.0)
HCT: 28.7 % — ABNORMAL LOW (ref 36.0–46.0)
Hemoglobin: 9.5 g/dL — ABNORMAL LOW (ref 12.0–15.0)
Lymphocytes Relative: 18.5 % (ref 12.0–46.0)
Lymphs Abs: 1.2 10*3/uL (ref 0.7–4.0)
MCHC: 33 g/dL (ref 30.0–36.0)
MCV: 90.9 fl (ref 78.0–100.0)
Monocytes Absolute: 0.6 10*3/uL (ref 0.1–1.0)
Monocytes Relative: 9 % (ref 3.0–12.0)
Neutro Abs: 4.3 10*3/uL (ref 1.4–7.7)
Neutrophils Relative %: 68.2 % (ref 43.0–77.0)
Platelets: 258 10*3/uL (ref 150.0–400.0)
RBC: 3.16 Mil/uL — ABNORMAL LOW (ref 3.87–5.11)
RDW: 14.4 % (ref 11.5–15.5)
WBC: 6.3 10*3/uL (ref 4.0–10.5)

## 2021-06-10 LAB — LIPID PANEL
Cholesterol: 138 mg/dL (ref 0–200)
HDL: 59.8 mg/dL (ref >39.00)
LDL Cholesterol: 60 mg/dL (ref 0–99)
NonHDL: 77.88
Total CHOL/HDL Ratio: 2
Triglycerides: 91 mg/dL (ref 0.0–149.0)
VLDL: 18.2 mg/dL (ref 0.0–40.0)

## 2021-06-10 LAB — POCT GLYCOSYLATED HEMOGLOBIN (HGB A1C): Hemoglobin A1C: 6.7 % — AB (ref 4.0–5.6)

## 2021-06-10 LAB — TSH: TSH: 3.29 u[IU]/mL (ref 0.35–5.50)

## 2021-06-10 NOTE — Progress Notes (Signed)
Subjective  Chief Complaint  Patient presents with   Annual Exam    Fasting   Hypertension   Hyperlipidemia   Diabetes    HPI: Carla Foster is a 80 y.o. female who presents to [facility] today for a Female Wellness Visit. She also has the concerns and/or needs as listed above in the chief complaint. These will be addressed in addition to the Health Maintenance Visit.   Wellness Visit: annual visit with health maintenance review and exam without Pap  Health maintenance: Patient due for bone density to follow-up osteopenia.  Overall she is doing well.  Here with her daughter.  No new concerns.  Eye exam up-to-date.  Eligible for second Shingrix.  Other immunizations are up-to-date.  Eye exam is current.  Chronic disease f/u and/or acute problem visit: (deemed necessary to be done in addition to the wellness visit): Diabetes follow up: Her diabetic control is reported as Unchanged.  She denies exertional CP or SOB or symptomatic hypoglycemia. She denies foot sores or paresthesias.  She was unable to afford Wilder Glade, is back on metformin XR 500 daily.  Tolerating this well.  No symptoms of hyperglycemia. Malignant hypertension: Blood pressure mildly elevated in office today, however home readings remain mostly controlled.  Her diastolics can run low, averaging 60.  In the past she has had several lower blood pressures than that.  Systolics range between 740C to 140s.  We did cut back her amlodipine to 5 mg daily at last visit.  She will see cardiology in the next few months. Chronic lower extremity edema due to chronic right-sided heart failure is fairly well controlled on Lasix 40 mg daily. Chronic renal insufficiency and chronic disease anemia: No chest pain, dyspnea exertion or increased fatigue. Hyperlipidemia on statin.  She is fasting today for blood work.  Has been well controlled.  No adverse effects.  She does have history of hepatic steatosis.  Immunization  History  Administered Date(s) Administered   Fluad Quad(high Dose 65+) 04/10/2019, 06/05/2020, 04/01/2021   Influenza, High Dose Seasonal PF 04/20/2016, 04/20/2017, 04/15/2018, 06/20/2019   Pneumococcal Conjugate-13 04/10/2019   Pneumococcal Polysaccharide-23 05/09/2007   Td 03/30/2007   Zoster Recombinat (Shingrix) 08/16/2020    Diabetes Related Lab Review: Lab Results  Component Value Date   HGBA1C 6.7 (A) 06/10/2021   HGBA1C 6.5 (A) 04/01/2021   HGBA1C 6.2 (A) 12/05/2020    No results found for: Derl Barrow Lab Results  Component Value Date   CREATININE 1.22 (H) 08/09/2020   BUN 31 (H) 08/09/2020   NA 132 (L) 08/09/2020   K 4.7 08/09/2020   CL 98 08/09/2020   CO2 24 08/09/2020   Lab Results  Component Value Date   CHOL 138 06/05/2020   CHOL 139 04/10/2019   CHOL 116 07/26/2018   Lab Results  Component Value Date   HDL 59 06/05/2020   HDL 61.10 04/10/2019   HDL 59 07/26/2018   Lab Results  Component Value Date   LDLCALC 59 06/05/2020   LDLCALC 62 04/10/2019   LDLCALC 47 07/26/2018   Lab Results  Component Value Date   TRIG 114 06/05/2020   TRIG 77.0 04/10/2019   TRIG 49 07/26/2018   Lab Results  Component Value Date   CHOLHDL 2.3 06/05/2020   CHOLHDL 2 04/10/2019   CHOLHDL 2.0 07/26/2018   No results found for: LDLDIRECT The ASCVD Risk score (Arnett DK, et al., 2019) failed to calculate for the following reasons:  The 2019 ASCVD risk score is only valid for ages 57 to 95  BP Readings from Last 3 Encounters:  06/10/21 138/60  04/01/21 140/76  12/05/20 132/68   Wt Readings from Last 3 Encounters:  06/10/21 147 lb (66.7 kg)  04/01/21 144 lb 3.2 oz (65.4 kg)  12/05/20 146 lb (66.2 kg)    Health Maintenance  Topic Date Due   Zoster Vaccines- Shingrix (2 of 2) 10/11/2020   DEXA SCAN  04/26/2021   OPHTHALMOLOGY EXAM  08/02/2021   HEMOGLOBIN A1C  12/08/2021   FOOT EXAM  04/01/2022   Pneumonia Vaccine 65+ Years old  Completed    INFLUENZA VACCINE  Completed   HPV VACCINES  Aged Out   TETANUS/TDAP  Discontinued   COVID-19 Vaccine  Discontinued     Assessment  1. Annual physical exam   2. Type 2 diabetes mellitus with diabetic nephropathy, without long-term current use of insulin (HCC)   3. Stage 3a chronic kidney disease ([place])   4. Chronic heart failure with preserved ejection fraction (HCC)   5. Anemia of chronic disease   6. Combined hyperlipidemia associated with type 2 diabetes mellitus ([place])   7. Hypertensive heart disease with heart failure ([place])   8. Asymptomatic menopausal state   9. Osteopenia after menopause   10. Mixed stress and urge urinary incontinence      Plan  Female Wellness Visit: Age appropriate Health Maintenance and Prevention measures were discussed with patient. Included topics are cancer screening recommendations, ways to keep healthy (see AVS) including dietary and exercise recommendations, regular eye and dental care, use of seat belts, and avoidance of moderate alcohol use and tobacco use.  BMI: discussed patient's BMI and encouraged positive lifestyle modifications to help get to or maintain a target BMI. HM needs and immunizations were addressed and ordered. See below for orders. See HM and immunization section for updates.  Patient to get second Shingrix vaccination at pharmacy Routine labs and screening tests ordered including cmp, cbc and lipids where appropriate. Discussed recommendations regarding Vit D and calcium supplementation (see AVS)  Chronic disease management visit and/or acute problem visit: Diabetes continues to be well controlled on metformin XR 500 daily.  Check renal function electrolytes.  Screens are up-to-date.  Eye exams up-to-date.  Immunizations are up-to-date. Hyperlipidemia: Recheck fasting lipids on simvastatin 20 mg nightly.  Check LFTs. Hypertension, malignant: Continue current blood pressure medications.  Follow-up with cardiology.  Continue  amlodipine at 5 mg daily, can consider increasing back to 10 if systolics remain very elevated. Chronic heart failure on Lasix 40 mg daily.  Check renal function and potassium.  Continues to have some lower extremity edema.  Recommend compression stockings. Osteopenia: Follow-up bone density ordered. Reviewed urology notes from recent visit.  Started Myrbetriq for mixed urinary incontinence.  Mildly improved.  Has follow-up for exam.  May warrant further evaluation.  Follow up: 6 months to recheck diabetes and hypertension. Orders Placed This Encounter  Procedures   DG Bone Density   CBC with Differential/Platelet   Comprehensive metabolic panel   Lipid panel   TSH   POCT HgB A1C   No orders of the defined types were placed in this encounter.     Body mass index is 23.73 kg/m. Wt Readings from Last 3 Encounters:  06/10/21 147 lb (66.7 kg)  04/01/21 144 lb 3.2 oz (65.4 kg)  12/05/20 146 lb (66.2 kg)     Patient Active Problem List   Diagnosis Date Noted   Hypertensive  heart disease with heart failure ([place]) 09/12/2019    Priority: High   History of right breast cancer 06/05/2019    Priority: High   PAD (peripheral artery disease) ([place]) - asymptomatic 04/27/2019    Priority: High    Quantiflo ABI's by in home nurse assessment: Bilateral abnl: betweein 0.6 and 0.7 Can't tolerate aspirin    Anemia of chronic disease 01/10/2019    Priority: High    12/2018 labs and 05/2020    CKD (chronic kidney disease) stage 3, GFR 30-59 ml/min (HCC) 01/06/2019    Priority: High   Chronic heart failure with preserved ejection fraction ([place]) 09/05/2018    Priority: High   H/O: upper GI bleed 09/05/2018    Priority: High    11/2017, Peptic ulcer by EGD, Dr. Carlean Purl; hospitalized    Type 2 diabetes mellitus ([place]) 07/26/2018    Priority: High   Malignant hypertension 07/26/2018    Priority: High   Pulmonary hypertension ([place]) 08/27/2015    Priority: High   Combined hyperlipidemia  associated with type 2 diabetes mellitus ([place]) 01/23/2011    Priority: High   Hepatic steatosis 06/05/2020    Priority: Medium    Primary osteoarthritis of right knee 01/29/2020    Priority: Medium    Osteopenia after menopause 04/28/2019    Priority: Medium     dexa 04/2019 T = - 2.3 L/spine lowest. Recheck 2 years.     Leg edema 12/08/2018    Priority: Medium    Iron deficiency anemia 08/22/2018    Priority: Medium     Due to peptic ulcer/UGI bleed hospitalized 11/2017; EGD by Dr. Carlean Purl at that time.     Hearing loss 08/27/2015    Priority: Medium    Asthma 08/27/2015    Priority: Medium    Mixed stress and urge urinary incontinence 06/10/2021   Hyponatremia 11/20/2017   Health Maintenance  Topic Date Due   Zoster Vaccines- Shingrix (2 of 2) 10/11/2020   DEXA SCAN  04/26/2021   OPHTHALMOLOGY EXAM  08/02/2021   HEMOGLOBIN A1C  12/08/2021   FOOT EXAM  04/01/2022   Pneumonia Vaccine 79+ Years old  Completed   INFLUENZA VACCINE  Completed   HPV VACCINES  Aged Out   TETANUS/TDAP  Discontinued   COVID-19 Vaccine  Discontinued   Immunization History  Administered Date(s) Administered   Fluad Quad(high Dose 65+) 04/10/2019, 06/05/2020, 04/01/2021   Influenza, High Dose Seasonal PF 04/20/2016, 04/20/2017, 04/15/2018, 06/20/2019   Pneumococcal Conjugate-13 04/10/2019   Pneumococcal Polysaccharide-23 05/09/2007   Td 03/30/2007   Zoster Recombinat (Shingrix) 08/16/2020   We updated and reviewed the patient's past history in detail and it is documented below. Allergies: Patient is allergic to tiazac [diltiazem hcl]. Past Medical History Patient  has a past medical history of Acute blood loss anemia, AKI (acute kidney injury) ([place]), Asthma, CKD (chronic kidney disease) stage 3, GFR 30-59 ml/min (HCC) (01/13/349), Complication of anesthesia, Diabetes mellitus, Family history of upper GI bleeding (09/05/2018), GERD (gastroesophageal reflux disease), GI bleed, H/O: upper GI bleed  (09/05/2018), Hematemesis, History of radiation therapy (08/07/19- 09/01/19), Hyperlipidemia, Hypertension, Melena, Osteopenia (04/28/2019), PAD (peripheral artery disease) ([place]) - asymptomatic (04/27/2019), and PONV (postoperative nausea and vomiting). Past Surgical History Patient  has a past surgical history that includes Partial hysterectomy; Cervical discectomy (08/23/00); Esophagogastroduodenoscopy (egd) with propofol (N/A, 11/21/2017); TEE without cardioversion (N/A, 08/01/2018); Breast lumpectomy with radioactive seed localization (Right, 07/10/2019); Bladder surgery; Breast surgery; Cataract extraction w/ intraocular lens implant (Left, 11/13/2015); Cataract extraction w/ intraocular  lens implant (Right); and Abdominal hysterectomy. Family History: Patient family history includes Asthma in her daughter; Atrial fibrillation in her mother; Cancer in her daughter; Emphysema in her father; Hypertension in her mother; Stroke in her mother. Social History:  Patient  reports that she quit smoking about 47 years ago. Her smoking use included cigarettes. She has a 18.00 pack-year smoking history. She has never used smokeless tobacco. She reports that she does not drink alcohol and does not use drugs.  Review of Systems: Constitutional: negative for fever or malaise Ophthalmic: negative for photophobia, double vision or loss of vision Cardiovascular: negative for chest pain, dyspnea on exertion, or new LE swelling Respiratory: negative for SOB or persistent cough Gastrointestinal: negative for abdominal pain, change in bowel habits or melena Genitourinary: negative for dysuria or gross hematuria, no abnormal uterine bleeding or disharge Musculoskeletal: negative for new gait disturbance or muscular weakness Integumentary: negative for new or persistent rashes, no breast lumps Neurological: negative for TIA or stroke symptoms Psychiatric: negative for SI or delusions Allergic/Immunologic: negative for  hives  Patient Care Team    Relationship Specialty Notifications Start End  Leamon Arnt, MD PCP - General Family Medicine  09/06/20   Nahser, Wonda Cheng, MD PCP - Cardiology Cardiology Admissions 12/08/18   Juanita Craver, MD Consulting Physician Gastroenterology  08/22/18   Mammography, [facility]  Diagnostic Radiology  08/22/18   Mauro Kaufmann, RN Oncology Nurse Navigator   06/02/19   Rockwell Germany, RN Oncology Nurse Navigator   06/02/19   Magrinat, Virgie Dad, MD Consulting Physician Oncology  06/07/19   Eppie Gibson, MD Consulting Physician Radiation Oncology  06/07/19   Rutherford Guys, MD Consulting Physician Ophthalmology  06/07/19   Vicie Mutters, MD Consulting Physician Otolaryngology  06/07/19   Edythe Clarity, [facility]  Pharmacist  02/24/21   Bjorn Loser, MD Consulting Physician Urology  06/10/21     Objective  Vitals: BP 138/60 Comment: this am at home; diastolic can run low  Pulse 69   Temp 98.1 F (36.7 C) (Temporal)   Ht 5\' 6"  (1.676 m)   Wt 147 lb (66.7 kg)   SpO2 99%   BMI 23.73 kg/m  General:  Well developed, well nourished, no acute distress, hard of hearing Psych:  Alert and orientedx3,normal mood and affect HEENT:  Normocephalic, atraumatic, non-icteric sclera,  supple neck without adenopathy, mass or thyromegaly Cardiovascular:  Normal S1, S2, RRR without gallop, rub or murmur, +1 pitting edema bilateral lower extremities Respiratory:  Good breath sounds bilaterally, CTAB with normal respiratory effort Gastrointestinal: normal bowel sounds, soft, non-tender, no noted masses. No HSM MSK: no deformities, contusions. Joints are without erythema or swelling.  Skin:  Warm, no rashes or suspicious lesions noted Neurologic:    Mental status is normal.No tremor   Commons side effects, risks, benefits, and alternatives for medications and treatment plan prescribed today were discussed, and the patient expressed understanding of the given instructions. Patient is instructed  to call or message via MyChart if he/she has any questions or concerns regarding our treatment plan. No barriers to understanding were identified. We discussed Red Flag symptoms and signs in detail. Patient expressed understanding regarding what to do in case of urgent or emergency type symptoms.  Medication list was reconciled, printed and provided to the patient in AVS. Patient instructions and summary information was reviewed with the patient as documented in the AVS. This note was prepared with assistance of Dragon voice recognition software. Occasional wrong-word or sound-a-like substitutions  may have occurred due to the inherent limitations of voice recognition software  This visit occurred during the SARS-CoV-2 public health emergency.  Safety protocols were in place, including screening questions prior to the visit, additional usage of staff PPE, and extensive cleaning of exam room while observing appropriate contact time as indicated for disinfecting solutions.

## 2021-06-10 NOTE — Patient Instructions (Signed)
Please return in 6 months for diabetes and hypertension follow up.   I will release your lab results to you on your MyChart account with further instructions. Please reply with any questions.    Go get your shingrix vaccine when you can.   I have ordered a mammogram and/or bone density for you as we discussed today: []   Mammogram  [x]   Bone Density  Please call the office checked below to schedule your appointment:  [x]   [facility]      [place], [place]         []   [facility]  [place], [place]  If you have any questions or concerns, please don't hesitate to send me a message via MyChart or call the office at [phone]. Thank you for visiting with [place] today! It's our pleasure caring for you.

## 2021-07-10 ENCOUNTER — Other Ambulatory Visit: Payer: Self-pay | Admitting: Cardiovascular Disease

## 2021-07-25 ENCOUNTER — Other Ambulatory Visit: Payer: Self-pay | Admitting: Family Medicine

## 2021-07-31 DIAGNOSIS — Z961 Presence of intraocular lens: Secondary | ICD-10-CM | POA: Diagnosis not present

## 2021-07-31 DIAGNOSIS — E119 Type 2 diabetes mellitus without complications: Secondary | ICD-10-CM | POA: Diagnosis not present

## 2021-07-31 DIAGNOSIS — Z7984 Long term (current) use of oral hypoglycemic drugs: Secondary | ICD-10-CM | POA: Diagnosis not present

## 2021-07-31 LAB — HM DIABETES EYE EXAM

## 2021-08-01 ENCOUNTER — Other Ambulatory Visit: Payer: Self-pay | Admitting: Family Medicine

## 2021-08-01 DIAGNOSIS — Z780 Asymptomatic menopausal state: Secondary | ICD-10-CM

## 2021-08-01 DIAGNOSIS — Z0000 Encounter for general adult medical examination without abnormal findings: Secondary | ICD-10-CM

## 2021-08-08 ENCOUNTER — Other Ambulatory Visit: Payer: Self-pay | Admitting: Cardiovascular Disease

## 2021-08-22 ENCOUNTER — Encounter: Payer: Self-pay | Admitting: Family Medicine

## 2021-08-22 NOTE — Progress Notes (Unsigned)
Chronic Care Management Pharmacy Note  08/22/2021 Name:  Carla Foster MRN:  [id number] DOB:  06/05/1941  Subjective: Carla Foster is an 81 y.o. year old female who is a primary patient of Leamon Arnt, MD.  The CCM team was consulted for assistance with disease management and care coordination needs.  Accompanied by daughter Audrea Muscat.    Engaged with patient by telephone for follow up visit in response to provider referral for pharmacy case management and/or care coordination services.   Consent to Services:  The patient was given the following information about Chronic Care Management services today, agreed to services, and gave verbal consent: 1. CCM service includes personalized support from designated clinical staff supervised by the primary care provider, including individualized plan of care and coordination with other care providers 2. 24/7 contact phone numbers for assistance for urgent and routine care needs. 3. Service will only be billed when office clinical staff spend 20 minutes or more in a month to coordinate care. 4. Only one practitioner may furnish and bill the service in a calendar month. 5.The patient may stop CCM services at any time (effective at the end of the month) by phone call to the office staff. 6. The patient will be responsible for cost sharing (co-pay) of up to 20% of the service fee (after annual deductible is met). Patient agreed to services and consent obtained.  Patient Care Team: Leamon Arnt, MD as PCP - General (Family Medicine) Nahser, Wonda Cheng, MD as PCP - Cardiology (Cardiology) Juanita Craver, MD as Consulting Physician (Gastroenterology) Mammography, [facility] (Diagnostic Radiology) Mauro Kaufmann, RN as Oncology Nurse Navigator Rockwell Germany, RN as Oncology Nurse Navigator Magrinat, Virgie Dad, MD (Inactive) as Consulting Physician (Oncology) Eppie Gibson, MD as Consulting Physician (Radiation Oncology) Rutherford Guys, MD as Consulting  Physician (Ophthalmology) Vicie Mutters, MD as Consulting Physician (Otolaryngology) Edythe Clarity, [facility] (Pharmacist) Bjorn Loser, MD as Consulting Physician (Urology)  Recent office visits:  12/05/20 Jonni Sanger) - Metformin XR 511m decreased to ONCE daily due to some hypoglycemia. 09/03/20-Billey Chang MD- chronic conditions addressed, follow up 3 months for BP and kidney 08/09/20- CBilley Chang MD- seen for CHF, shingrix vaccine administered, follow up as scheduled 07/26/20- CBilley Chang MD- Seen for  Leg swelling/ chronic conditions,  increased lasix to twice daily for 1 week and recheck with labs in 2 weeks, started symbicort bid 06/05/20-  CBilley Chang MD- chronic conditions addressed, follow up 3 months   Recent consult visits:  09/06/20- Nahser, PWonda Cheng MD( Cardiology)- follow up visit for htn, short course prednisone  x5 DS follow up 1 yr    Hospital visits:  None in previous 6 months  Objective:  Lab Results  Component Value Date   CREATININE 1.67 (H) 06/10/2021   CREATININE 1.22 (H) 08/09/2020   CREATININE 1.19 07/26/2020   Lab Results  Component Value Date   HGBA1C 6.7 (A) 06/10/2021   HGBA1C 6.5 (A) 04/01/2021   HGBA1C 6.2 (A) 12/05/2020   HGBA1C 5.9 (A) 09/03/2020   HGBA1C 6.5 (H) 06/05/2020    Last diabetic Eye exam:  Lab Results  Component Value Date/Time   HMDIABEYEEXA No Retinopathy 07/31/2021 12:00 AM    Last diabetic Foot exam: No results found for: HMDIABFOOTEX      Component Value Date/Time   CHOL 138 06/10/2021 0922   CHOL 131 01/11/2018 1011   TRIG 91.0 06/10/2021 0922   HDL 59.80 06/10/2021 0922   HDL 58 01/11/2018 1011  CHOLHDL 2 06/10/2021 0922   VLDL 18.2 06/10/2021 0922   LDLCALC 60 06/10/2021 0922   LDLCALC 59 06/05/2020 1005    Hepatic Function Latest Ref Rng & Units 06/10/2021 07/26/2020 06/05/2020  Total Protein 6.0 - 8.3 g/dL 7.1 6.9 6.4  Albumin 3.5 - 5.2 g/dL 4.0 3.9 -  AST 0 - 37 U/L 25 20 18   ALT 0 - 35 U/L 10 8  10   Alk Phosphatase 39 - 117 U/L 20(L) 23(L) -  Total Bilirubin 0.2 - 1.2 mg/dL 0.4 0.5 0.4  Bilirubin, Direct 0.0 - 0.2 mg/dL - - -    Lab Results  Component Value Date/Time   TSH 3.29 06/10/2021 09:22 AM   TSH 2.91 06/05/2020 10:05 AM    CBC Latest Ref Rng & Units 06/10/2021 06/05/2020 06/07/2019  WBC 4.0 - 10.5 K/uL 6.3 7.7 8.0  Hemoglobin 12.0 - 15.0 g/dL 9.5(L) 9.8(L) 10.0(L)  Hematocrit 36.0 - 46.0 % 28.7(L) 29.8(L) 31.2(L)  Platelets 150.0 - 400.0 K/uL 258.0 284 270    No results found for: VD25OH  Clinical ASCVD:  The ASCVD Risk score (Arnett DK, et al., 2019) failed to calculate for the following reasons:   The 2019 ASCVD risk score is only valid for ages 90 to 66    Social History   Tobacco Use  Smoking Status Former   Packs/day: 1.00   Years: 18.00   Pack years: 18.00   Types: Cigarettes   Quit date: 01/13/1974   Years since quitting: 47.6  Smokeless Tobacco Never   BP Readings from Last 3 Encounters:  06/10/21 138/60  04/01/21 140/76  12/05/20 132/68   Pulse Readings from Last 3 Encounters:  06/10/21 69  04/01/21 62  12/05/20 78   Wt Readings from Last 3 Encounters:  06/10/21 147 lb (66.7 kg)  04/01/21 144 lb 3.2 oz (65.4 kg)  12/05/20 146 lb (66.2 kg)    Assessment: Review of patient past medical history, allergies, medications, health status, including review of consultants reports, laboratory and other test data, was performed as part of comprehensive evaluation and provision of chronic care management services.   SDOH:  (Social Determinants of Health) assessments and interventions performed: Yes   CCM Care Plan  Allergies  Allergen Reactions   Tiazac [Diltiazem Hcl] Other (See Comments)    Ankle edema    Medications Reviewed Today     Reviewed by Gean Birchwood, CMA (Certified Medical Assistant) on 06/10/21 at 0859  Med List Status: <None>   Medication Order Taking? Sig Documenting Provider Last Dose Status Informant  albuterol  (VENTOLIN HFA) 108 (90 Base) MCG/ACT inhaler [id number] Yes Inhale 2 puffs into the lungs every 6 (six) hours as needed for wheezing or shortness of breath. Leamon Arnt, MD Taking Active   amLODipine (NORVASC) 10 MG tablet [id number] Yes Take 0.5 tablets (5 mg total) by mouth daily. Leamon Arnt, MD Taking Active   Calcium Carbonate-Vitamin D (CALCIUM 600 + D PO) [id number] Yes Take 1 tablet by mouth daily. [provider] Taking Active Self  carvedilol (COREG) 25 MG tablet [id number] Yes Take 1 tablet (25 mg total) by mouth 2 (two) times daily. Nahser, Wonda Cheng, MD Taking Active   diclofenac Sodium (VOLTAREN) 1 % GEL [id number] Yes Apply 4 g topically 4 (four) times daily as needed (right knee pain). Leamon Arnt, MD Taking Active   doxazosin (CARDURA) 4 MG tablet [id number] Yes TAKE 1 TABLET BY MOUTH AT  BEDTIME Nahser, Wonda Cheng, MD Taking Active  fenofibrate 160 MG tablet [id number] Yes TAKE 1 TABLET BY MOUTH  DAILY Leamon Arnt, MD Taking Active   ferrous sulfate 325 (65 FE) MG tablet [id number] Yes Take 1 tablet (325 mg total) by mouth daily. Leamon Arnt, MD Taking Active   furosemide (LASIX) 40 MG tablet [id number] Yes TAKE 1 TABLET BY MOUTH DAILY Leamon Arnt, MD Taking Active   losartan (COZAAR) 100 MG tablet [id number] Yes Take 1 tablet (100 mg total) by mouth daily. Nahser, Wonda Cheng, MD Taking Active   metFORMIN (GLUCOPHAGE XR) 500 MG 24 hr tablet [id number] Yes Take 1 tablet (500 mg total) by mouth daily with breakfast. Leamon Arnt, MD Taking Active   mirabegron ER (MYRBETRIQ) 50 MG TB24 tablet [id number] Yes Take 50 mg by mouth daily. [provider] Taking Active   Multiple Vitamin (MULTIVITAMIN) tablet [id number] Yes Take 1 tablet by mouth daily. [provider] Taking Active   simvastatin (ZOCOR) 20 MG tablet [id number] Yes TAKE 1 TABLET BY MOUTH AT  BEDTIME Leamon Arnt, MD Taking Active   SYMBICORT 80-4.5 MCG/ACT inhaler [id number] Yes USE 2  INHALATIONS BY MOUTH  TWICE DAILY Leamon Arnt, MD Taking Active   vitamin A 8000 UNIT capsule [id number] Yes Take 8,000 Units by mouth daily.  [provider] Taking Active Self  vitamin B-12 (CYANOCOBALAMIN) 500 MCG tablet [id number] Yes Take 500 mcg by mouth daily. [provider] Taking Active   vitamin C (ASCORBIC ACID) 500 MG tablet [id number] Yes Take 500 mg by mouth daily. [provider] Taking Active Self            Patient Active Problem List   Diagnosis Date Noted   Mixed stress and urge urinary incontinence 06/10/2021   Hepatic steatosis 06/05/2020   Primary osteoarthritis of right knee 01/29/2020   Hypertensive heart disease with heart failure ([place]) 09/12/2019   History of right breast cancer 06/05/2019   Osteopenia after menopause 04/28/2019   PAD (peripheral artery disease) ([place]) - asymptomatic 04/27/2019   Anemia of chronic disease 01/10/2019   CKD (chronic kidney disease) stage 3, GFR 30-59 ml/min (HCC) 01/06/2019   Leg edema 12/08/2018   Chronic heart failure with preserved ejection fraction ([place]) 09/05/2018   H/O: upper GI bleed 09/05/2018   Iron deficiency anemia 08/22/2018   Type 2 diabetes mellitus ([place]) 07/26/2018   Malignant hypertension 07/26/2018   Hyponatremia 11/20/2017   Hearing loss 08/27/2015   Pulmonary hypertension ([place]) 08/27/2015   Asthma 08/27/2015   Combined hyperlipidemia associated with type 2 diabetes mellitus ([place]) 01/23/2011    Immunization History  Administered Date(s) Administered   Fluad Quad(high Dose 65+) 04/10/2019, 06/05/2020, 04/01/2021   Influenza, High Dose Seasonal PF 04/20/2016, 04/20/2017, 04/15/2018, 06/20/2019   Pneumococcal Conjugate-13 04/10/2019   Pneumococcal Polysaccharide-23 05/09/2007   Td 03/30/2007   Zoster Recombinat (Shingrix) 08/16/2020    Conditions to be addressed/monitored: PAD, CAD, CHF, CKD, Osteopenia, OA, T2DM, HLD, Asthma, hx of GI bleed  There are no care plans that  you recently modified to display for this patient.    Medication Assistance: None required.  Patient affirms current coverage meets needs.  Patient's preferred pharmacy is:  [employer] ([place], [place] [facility] [address] [place] [address] Phone: [phone] Fax: [phone]  [facility] ([facility] ) - [place], [place] [address] Phone:  [phone] Fax: [phone]  [id number] [place] - [address] [place] [address]Phone: [phone]: [phone]  Follow Up:  Patient agrees to Care Plan and Follow-up. Plan: FU in 6 months Future Appointments  Date Time Provider [place] 09/01/2021  2:45 PM [facility] CCM PHARMACIST [facility] P[facility]  09/22/2021  1:20 PM Nahser, PWonda Cheng MD [facility]  12/08/2021  1:30 PM ALeamon Arnt MD [facility]  12/24/2021  2:30 PM [facility] DX DEXA 1 GI-BCGDG GI-BREAST CE  06/15/2022  9:00 AM ALeamon Arnt MD [facility] PFox PharmD Clinical Pharmacist  [phone]   Current Barriers:  None at this time  Pharmacist Clinical Goal(s):  Patient will contact provider office for questions/concerns as evidenced notation of same in electronic health record through collaboration with PharmD and provider.   Interventions: 1:1 collaboration with ALeamon Arnt MD regarding development and update of comprehensive plan of care as evidenced by provider attestation and co-signature. Inter-disciplinary care team collaboration (see longitudinal plan of care). Comprehensive medication review performed; medication list updated in electronic medical record   Hypertension (BP goal <130/80) -Controlled HFpEF, CKD III -Current treatment: Furosemide 40 mg once  daily (Dr AJonni Sanger Carvedilol 25 mg twice daily (Dr NCathie Olden Doxazosin 4 mg once daily (Dr NCathie Olden Losartan 100 mg once daily (Dr NCathie Olden -Current home readings: at goal -Denies hypotensive/hypertensive symptoms -Educated on BP goals and benefits of medications for prevention of heart attack, stroke and kidney damage; Symptoms of hypotension and importance of maintaining adequate hydration; -Counseled to monitor BP at home as directed, document, and provide log at future appointments -Recommended to continue current medication  Hyperlipidemia: (LDL goal < 70) -Controlled -Current treatment: Simvastatin 20 mg once daily  Fenofibrate 160 mg once daily  -Educated on Benefits of statin for ASCVD risk reduction; -Recommended to continue current medication  Diabetes (A1c goal <7%) -Controlled -GFR ~43 07/2020, ckd stage III.  -Reports ongoing diarrhea, at times not being able to make it to the bathroom. Feels this has been persistent following metformin dose increase >1 yr ago.  -Current medications: Metformin XR 5047mdaily -Medications previously tried:   -Current home glucose readings FBG low 100s  -Denies hypoglycemic/hyperglycemic symptoms -Current meal patterns: fruit, pinto beans, potatoes, greens. Drinks: diet dr pepper, lots of water. -Current exercise: active with daily activities inside and outdoor activites  -Educated on Complications of diabetes including kidney damage, retinal damage, and cardiovascular disease; Benefits of routine self-monitoring of blood sugar;  Could consider addition or switch to sglt2i such as farxiga in the future as well due to hfpef ckd dm.  Update 02/24/21 110 fasting sugar this morning, they have all been around this for the most part She denies any more episodes of hypoglycemia and GI symptoms have resolved since switching to the XR formulation of metformin. Continue current meds for now - PCP discussing Jardiance with cardio.  Asthma (Goal:  control symptoms and prevent exacerbations) -Controlled -Symptoms have not been persistent -Current treatment  Symbicort 80-4.5 mcg/act 2 puffs into the lungs 2 (two) times daily. Ventolin 2 puffs as needed every 6  hours -Reviewed appropriate inhaler use - no problems noted -Recommended to continue current medication  Update 02/24/21 Patient reports some SOB recently.  She is only using her Symbicort prn.  Counseled on using it daily as maintenance.  Patient is agreeable to plan.  Osteopenia (Goal maintain bone density) -Controlled -Last DEXA Scan: 2020 -Patient is not a candidate for pharmacologic treatment -Current treatment  Calcium-vitamin D3 supplementation  -Recommend weight-bearing and muscle strengthening exercises for building and maintaining bone density. -Recommended to continue current medication  Patient Goals/Self-Care Activities Patient will:  - target a minimum of 150 minutes of moderate intensity exercise weekly

## 2021-09-01 ENCOUNTER — Telehealth: Payer: Medicare Other

## 2021-09-06 ENCOUNTER — Other Ambulatory Visit: Payer: Self-pay | Admitting: Cardiovascular Disease

## 2021-09-08 ENCOUNTER — Other Ambulatory Visit: Payer: Self-pay | Admitting: *Deleted

## 2021-09-08 ENCOUNTER — Ambulatory Visit (INDEPENDENT_AMBULATORY_CARE_PROVIDER_SITE_OTHER): Payer: Medicare Other | Admitting: Pharmacist

## 2021-09-08 DIAGNOSIS — J4530 Mild persistent asthma, uncomplicated: Secondary | ICD-10-CM

## 2021-09-08 DIAGNOSIS — E1121 Type 2 diabetes mellitus with diabetic nephropathy: Secondary | ICD-10-CM

## 2021-09-08 MED ORDER — ALBUTEROL SULFATE HFA 108 (90 BASE) MCG/ACT IN AERS: 2.0000 | INHALATION_SPRAY | Freq: Four times a day (QID) | RESPIRATORY_TRACT | 3 refills | Status: DC | PRN

## 2021-09-08 NOTE — Patient Instructions (Addendum)
Visit Information   Goals Addressed             This Visit's Progress    Monitor and Manage My Blood Sugar-Diabetes Type 2   On track    Timeframe:  Long-Range Goal Priority:  High Start Date:  02/24/21                           Expected End Date:  08/27/21                     Follow Up Date 06/18/21    - check blood sugar at prescribed times - check blood sugar before and after exercise    Why is this important?   Checking your blood sugar at home helps to keep it from getting very high or very low.  Writing the results in a diary or log helps the doctor know how to care for you.  Your blood sugar log should have the time, date and the results.  Also, write down the amount of insulin or other medicine that you take.  Other information, like what you ate, exercise done and how you were feeling, will also be helpful.     Notes:        Patient Care Plan: General Pharmacy (Adult)     Problem Identified: HTN, DM, Asthma   Priority: High  Onset Date: 02/24/2021     Long-Range Goal: Patient-Specific Goal   Start Date: 02/24/2021  Expected End Date: 08/27/2021  Recent Progress: On track  Priority: High  Note:   Current Barriers:  A1c trending up  Pharmacist Clinical Goal(s):  Patient will contact provider office for questions/concerns as evidenced notation of same in electronic health record through collaboration with PharmD and provider.   Interventions: 1:1 collaboration with Leamon Arnt, MD regarding development and update of comprehensive plan of care as evidenced by provider attestation and co-signature. Inter-disciplinary care team collaboration (see longitudinal plan of care). Comprehensive medication review performed; medication list updated in electronic medical record   Hypertension (BP goal <130/80) -Controlled HFpEF, CKD III -Current treatment: Furosemide 40 mg once daily (Dr Jonni Sanger) Carvedilol 25 mg twice daily (Dr Cathie Olden) Doxazosin 4 mg once daily (Dr  Cathie Olden) Losartan 100 mg once daily (Dr Cathie Olden) -Current home readings: at goal -Denies hypotensive/hypertensive symptoms -Educated on BP goals and benefits of medications for prevention of heart attack, stroke and kidney damage; Symptoms of hypotension and importance of maintaining adequate hydration; -Counseled to monitor BP at home as directed, document, and provide log at future appointments -Recommended to continue current medication  Hyperlipidemia: (LDL goal < 70) -Controlled -Current treatment: Simvastatin 20 mg once daily  Fenofibrate 160 mg once daily  -Educated on Benefits of statin for ASCVD risk reduction; -Recommended to continue current medication  Diabetes (A1c goal <7%) -Controlled -GFR ~43 07/2020, ckd stage III.  -Reports ongoing diarrhea, at times not being able to make it to the bathroom. Feels this has been persistent following metformin dose increase >1 yr ago.  -Current medications: Metformin XR 500mg  daily Query Appropriate, , ,  -Medications previously tried:   -Current home glucose readings FBG low 100s  -Denies hypoglycemic/hyperglycemic symptoms -Current meal patterns: fruit, pinto beans, potatoes, greens. Drinks: diet dr pepper, lots of water. -Current exercise: active with daily activities inside and outdoor activites  -Educated on Complications of diabetes including kidney damage, retinal damage, and cardiovascular disease; Benefits of routine self-monitoring of blood sugar;  Could consider addition or switch to sglt2i such as farxiga in the future as well due to hfpef ckd dm.  Update 02/24/21 110 fasting sugar this morning, they have all been around this for the most part She denies any more episodes of hypoglycemia and GI symptoms have resolved since switching to the XR formulation of metformin. Continue current meds for now - PCP discussing Jardiance with cardio.  Update 09/08/21 She reports that her glucose is normally between 100-140 fasting  when she checks it every morning. She denies any more episodes of hypoglycemia - discussed importance of eating regular meals. A1c has ben trending up over the past year. She has HF so she would be great candidate for SGLT-2 if renal function would allow. Recommend recheck A1c again in May, if continues to increase we could consider addition of [place] or Jardiance. Also, need to monitor GFR - if it is below 30 at next visit the use of metformin is contraindicated. Will have CMA Fu in 3 months to update status of PCP visit and labs.   Asthma (Goal: control symptoms and prevent exacerbations) -Controlled -Symptoms have not been persistent -Current treatment  Symbicort 80-4.5 mcg/act 2 puffs into the lungs 2 (two) times daily. Appropriate, Effective, Safe, Accessible Ventolin 2 puffs as needed every 6  hours Appropriate, Effective, Safe, Accessible -Reviewed appropriate inhaler use - no problems noted -Recommended to continue current medication  Update 02/24/21 Patient reports some SOB recently.  She is only using her Symbicort prn.  Counseled on using it daily as maintenance.  Patient is agreeable to plan.  Update 09/08/21 She reports she is using her maintenance inhaler daily. She is however out of her albuterol, will request a refill to be called in to [facility]. Denies any shortness of breath episodes recently. No issues with copays. No changes at this time - refill sent.  Osteopenia (Goal maintain bone density) -Controlled -Last DEXA Scan: 2020 -Patient is not a candidate for pharmacologic treatment -Current treatment  Calcium-vitamin D3 supplementation  -Recommend weight-bearing and muscle strengthening exercises for building and maintaining bone density. -Recommended to continue current medication  Patient Goals/Self-Care Activities Patient will:  - target a minimum of 150 minutes of moderate intensity exercise weekly          Patient verbalizes understanding of  instructions and care plan provided today and agrees to view in [place]. Active MyChart status confirmed with patient.   Telephone follow up appointment with pharmacy team member scheduled for: 6 months  Edythe Clarity, [place], PharmD Clinical Pharmacist  [facility] [phone]

## 2021-09-08 NOTE — Telephone Encounter (Signed)
Rx(s) sent to pharmacy electronically.  

## 2021-09-08 NOTE — Progress Notes (Signed)
Chronic Care Management Pharmacy Note  09/08/2021 Name:  Carla Foster MRN:  [id number] DOB:  [birth year]   Summary: FU visit. A1c trending up the past few visits.  If elevated in May could consider addition of SGLT-2 due to HF if renal function allows.  Also need to monitor GFR as metformin is contraindicated in GFR < 30.  Patient is trying to drink more water to avoid dehydration.  Requests refill on albuterol send to [facility].  Subjective: Carla Foster is an 81 y.o. year old female who is a primary patient of Leamon Arnt, MD.  The CCM team was consulted for assistance with disease management and care coordination needs.  Accompanied by daughter Audrea Muscat.    Engaged with patient by telephone for follow up visit in response to provider referral for pharmacy case management and/or care coordination services.   Consent to Services:  The patient was given the following information about Chronic Care Management services today, agreed to services, and gave verbal consent: 1. CCM service includes personalized support from designated clinical staff supervised by the primary care provider, including individualized plan of care and coordination with other care providers 2. 24/7 contact phone numbers for assistance for urgent and routine care needs. 3. Service will only be billed when office clinical staff spend 20 minutes or more in a month to coordinate care. 4. Only one practitioner may furnish and bill the service in a calendar month. 5.The patient may stop CCM services at any time (effective at the end of the month) by phone call to the office staff. 6. The patient will be responsible for cost sharing (co-pay) of up to 20% of the service fee (after annual deductible is met). Patient agreed to services and consent obtained.  Patient Care Team: Leamon Arnt, MD as PCP - General (Family Medicine) Nahser, Wonda Cheng, MD as PCP - Cardiology (Cardiology) Juanita Craver, MD as Consulting Physician  (Gastroenterology) Mammography, [facility] (Diagnostic Radiology) Mauro Kaufmann, RN as Oncology Nurse Navigator Rockwell Germany, RN as Oncology Nurse Navigator Magrinat, Virgie Dad, MD (Inactive) as Consulting Physician (Oncology) Eppie Gibson, MD as Consulting Physician (Radiation Oncology) Rutherford Guys, MD as Consulting Physician (Ophthalmology) Vicie Mutters, MD as Consulting Physician (Otolaryngology) Edythe Clarity, [facility] (Pharmacist) Bjorn Loser, MD as Consulting Physician (Urology)  Recent office visits:  12/05/20 Jonni Sanger) - Metformin XR 539m decreased to ONCE daily due to some hypoglycemia. 09/03/20-Billey Chang MD- chronic conditions addressed, follow up 3 months for BP and kidney 08/09/20- CBilley Chang MD- seen for CHF, shingrix vaccine administered, follow up as scheduled 07/26/20- CBilley Chang MD- Seen for  Leg swelling/ chronic conditions,  increased lasix to twice daily for 1 week and recheck with labs in 2 weeks, started symbicort bid 06/05/20-  CBilley Chang MD- chronic conditions addressed, follow up 3 months   Recent consult visits:  09/06/20- Nahser, PWonda Cheng MD( Cardiology)- follow up visit for htn, short course prednisone  x5 DS follow up 1 yr    Hospital visits:  None in previous 6 months  Objective:  Lab Results  Component Value Date   CREATININE 1.67 (H) 06/10/2021   CREATININE 1.22 (H) 08/09/2020   CREATININE 1.19 07/26/2020   Lab Results  Component Value Date   HGBA1C 6.7 (A) 06/10/2021   HGBA1C 6.5 (A) 04/01/2021   HGBA1C 6.2 (A) 12/05/2020   HGBA1C 5.9 (A) 09/03/2020   HGBA1C 6.5 (H) 06/05/2020    Last diabetic Eye exam:  Lab  Results  Component Value Date/Time   HMDIABEYEEXA No Retinopathy 07/31/2021 12:00 AM    Last diabetic Foot exam: No results found for: HMDIABFOOTEX      Component Value Date/Time   CHOL 138 06/10/2021 0922   CHOL 131 01/11/2018 1011   TRIG 91.0 06/10/2021 0922   HDL 59.80 06/10/2021 0922   HDL 58  01/11/2018 1011   CHOLHDL 2 06/10/2021 0922   VLDL 18.2 06/10/2021 0922   LDLCALC 60 06/10/2021 0922   LDLCALC 59 06/05/2020 1005    Hepatic Function Latest Ref Rng & Units 06/10/2021 07/26/2020 06/05/2020  Total Protein 6.0 - 8.3 g/dL 7.1 6.9 6.4  Albumin 3.5 - 5.2 g/dL 4.0 3.9 -  AST 0 - 37 U/L 25 20 18   ALT 0 - 35 U/L 10 8 10   Alk Phosphatase 39 - 117 U/L 20(L) 23(L) -  Total Bilirubin 0.2 - 1.2 mg/dL 0.4 0.5 0.4  Bilirubin, Direct 0.0 - 0.2 mg/dL - - -    Lab Results  Component Value Date/Time   TSH 3.29 06/10/2021 09:22 AM   TSH 2.91 06/05/2020 10:05 AM    CBC Latest Ref Rng & Units 06/10/2021 06/05/2020 06/07/2019  WBC 4.0 - 10.5 K/uL 6.3 7.7 8.0  Hemoglobin 12.0 - 15.0 g/dL 9.5(L) 9.8(L) 10.0(L)  Hematocrit 36.0 - 46.0 % 28.7(L) 29.8(L) 31.2(L)  Platelets 150.0 - 400.0 K/uL 258.0 284 270    No results found for: VD25OH  Clinical ASCVD:  The ASCVD Risk score (Arnett DK, et al., 2019) failed to calculate for the following reasons:   The 2019 ASCVD risk score is only valid for ages 58 to 38    Social History   Tobacco Use  Smoking Status Former   Packs/day: 1.00   Years: 18.00   Pack years: 18.00   Types: Cigarettes   Quit date: 01/13/1974   Years since quitting: 47.6  Smokeless Tobacco Never   BP Readings from Last 3 Encounters:  06/10/21 138/60  04/01/21 140/76  12/05/20 132/68   Pulse Readings from Last 3 Encounters:  06/10/21 69  04/01/21 62  12/05/20 78   Wt Readings from Last 3 Encounters:  06/10/21 147 lb (66.7 kg)  04/01/21 144 lb 3.2 oz (65.4 kg)  12/05/20 146 lb (66.2 kg)    Assessment: Review of patient past medical history, allergies, medications, health status, including review of consultants reports, laboratory and other test data, was performed as part of comprehensive evaluation and provision of chronic care management services.   SDOH:  (Social Determinants of Health) assessments and interventions performed: Yes   CCM Care  Plan  Allergies  Allergen Reactions   Tiazac [Diltiazem Hcl] Other (See Comments)    Ankle edema    Medications Reviewed Today     Reviewed by Edythe Clarity, [facility] (Pharmacist) on 09/08/21 at 1351  Med List Status: <None>   Medication Order Taking? Sig Documenting Provider Last Dose Status Informant  albuterol (VENTOLIN HFA) 108 (90 Base) MCG/ACT inhaler [id number] Yes Inhale 2 puffs into the lungs every 6 (six) hours as needed for wheezing or shortness of breath. Leamon Arnt, MD Taking Active   amLODipine (NORVASC) 10 MG tablet [id number] Yes TAKE 1 TABLET BY MOUTH  DAILY Leamon Arnt, MD Taking Active   Calcium Carbonate-Vitamin D (CALCIUM 600 + D PO) [id number] Yes Take 1 tablet by mouth daily. [provider] Taking Active Self  carvedilol (COREG) 25 MG tablet [id number] Yes Take 1 tablet (25 mg total) by mouth 2 (  two) times daily. PLEASE CONTACT THE OFFICE FOR A FOLLOW UP APPOINTMENT 1ST ATTEMPT Nahser, Wonda Cheng, MD Taking Active   diclofenac Sodium (VOLTAREN) 1 % GEL [id number] Yes Apply 4 g topically 4 (four) times daily as needed (right knee pain). Leamon Arnt, MD Taking Active   doxazosin (CARDURA) 4 MG tablet [id number] Yes TAKE 1 TABLET BY MOUTH AT  BEDTIME Nahser, Wonda Cheng, MD Taking Active   fenofibrate 160 MG tablet [id number] Yes TAKE 1 TABLET BY MOUTH  DAILY Leamon Arnt, MD Taking Active   ferrous sulfate 325 (65 FE) MG tablet [id number] Yes Take 1 tablet (325 mg total) by mouth daily. Leamon Arnt, MD Taking Active   furosemide (LASIX) 40 MG tablet [id number] Yes TAKE 1 TABLET BY MOUTH DAILY Leamon Arnt, MD Taking Active   losartan (COZAAR) 100 MG tablet [id number] Yes TAKE 1 TABLET BY MOUTH  DAILY Nahser, Wonda Cheng, MD Taking Active   metFORMIN (GLUCOPHAGE XR) 500 MG 24 hr tablet [id number] Yes Take 1 tablet (500 mg total) by mouth daily with breakfast. Leamon Arnt, MD Taking Active   mirabegron ER (MYRBETRIQ) 50 MG TB24 tablet [id number] Yes Take 50  mg by mouth daily. [provider] Taking Active   Multiple Vitamin (MULTIVITAMIN) tablet [id number] Yes Take 1 tablet by mouth daily. [provider] Taking Active   simvastatin (ZOCOR) 20 MG tablet [id number] Yes TAKE 1 TABLET BY MOUTH AT  BEDTIME Leamon Arnt, MD Taking Active   SYMBICORT 80-4.5 MCG/ACT inhaler [id number] Yes USE 2 INHALATIONS BY MOUTH  TWICE DAILY Leamon Arnt, MD Taking Active   vitamin A 8000 UNIT capsule [id number] Yes Take 8,000 Units by mouth daily.  [provider] Taking Active Self  vitamin B-12 (CYANOCOBALAMIN) 500 MCG tablet [id number] Yes Take 500 mcg by mouth daily. [provider] Taking Active   vitamin C (ASCORBIC ACID) 500 MG tablet [id number] Yes Take 500 mg by mouth daily. [provider] Taking Active Self            Patient Active Problem List   Diagnosis Date Noted   Mixed stress and urge urinary incontinence 06/10/2021   Hepatic steatosis 06/05/2020   Primary osteoarthritis of right knee 01/29/2020   Hypertensive heart disease with heart failure ([place]) 09/12/2019   History of right breast cancer 06/05/2019   Osteopenia after menopause 04/28/2019   PAD (peripheral artery disease) ([place]) - asymptomatic 04/27/2019   Anemia of chronic disease 01/10/2019   CKD (chronic kidney disease) stage 3, GFR 30-59 ml/min (HCC) 01/06/2019   Leg edema 12/08/2018   Chronic heart failure with preserved ejection fraction ([place]) 09/05/2018   H/O: upper GI bleed 09/05/2018   Iron deficiency anemia 08/22/2018   Type 2 diabetes mellitus ([place]) 07/26/2018   Malignant hypertension 07/26/2018   Hyponatremia 11/20/2017   Hearing loss 08/27/2015   Pulmonary hypertension ([place]) 08/27/2015   Asthma 08/27/2015   Combined hyperlipidemia associated with type 2 diabetes mellitus ([place]) 01/23/2011    Immunization History  Administered Date(s) Administered   Fluad Quad(high Dose 65+) 04/10/2019, 06/05/2020, 04/01/2021   Influenza,  High Dose Seasonal PF 04/20/2016, 04/20/2017, 04/15/2018, 06/20/2019   Pneumococcal Conjugate-13 04/10/2019   Pneumococcal Polysaccharide-23 05/09/2007   Td 03/30/2007   Zoster Recombinat (Shingrix) 08/16/2020    Conditions to be addressed/monitored: PAD, CAD, CHF, CKD, Osteopenia, OA, T2DM, HLD, Asthma, hx of GI bleed  Care Plan : General Pharmacy (Adult)  Updates made by Edythe Clarity, RPH since 09/08/2021 12:00  AM     Problem: HTN, DM, Asthma   Priority: High  Onset Date: 02/24/2021     Long-Range Goal: Patient-Specific Goal   Start Date: 02/24/2021  Expected End Date: 08/27/2021  Recent Progress: On track  Priority: High  Note:   Current Barriers:  A1c trending up  Pharmacist Clinical Goal(s):  Patient will contact provider office for questions/concerns as evidenced notation of same in electronic health record through collaboration with PharmD and provider.   Interventions: 1:1 collaboration with Leamon Arnt, MD regarding development and update of comprehensive plan of care as evidenced by provider attestation and co-signature. Inter-disciplinary care team collaboration (see longitudinal plan of care). Comprehensive medication review performed; medication list updated in electronic medical record   Hypertension (BP goal <130/80) -Controlled HFpEF, CKD III -Current treatment: Furosemide 40 mg once daily (Dr Jonni Sanger) Carvedilol 25 mg twice daily (Dr Cathie Olden) Doxazosin 4 mg once daily (Dr Cathie Olden) Losartan 100 mg once daily (Dr Cathie Olden) -Current home readings: at goal -Denies hypotensive/hypertensive symptoms -Educated on BP goals and benefits of medications for prevention of heart attack, stroke and kidney damage; Symptoms of hypotension and importance of maintaining adequate hydration; -Counseled to monitor BP at home as directed, document, and provide log at future appointments -Recommended to continue current medication  Hyperlipidemia: (LDL goal <  70) -Controlled -Current treatment: Simvastatin 20 mg once daily  Fenofibrate 160 mg once daily  -Educated on Benefits of statin for ASCVD risk reduction; -Recommended to continue current medication  Diabetes (A1c goal <7%) -Controlled -GFR ~43 07/2020, ckd stage III.  -Reports ongoing diarrhea, at times not being able to make it to the bathroom. Feels this has been persistent following metformin dose increase >1 yr ago.  -Current medications: Metformin XR 525m daily Query Appropriate, , ,  -Medications previously tried:   -Current home glucose readings FBG low 100s  -Denies hypoglycemic/hyperglycemic symptoms -Current meal patterns: fruit, pinto beans, potatoes, greens. Drinks: diet dr pepper, lots of water. -Current exercise: active with daily activities inside and outdoor activites  -Educated on Complications of diabetes including kidney damage, retinal damage, and cardiovascular disease; Benefits of routine self-monitoring of blood sugar;  Could consider addition or switch to sglt2i such as farxiga in the future as well due to hfpef ckd dm.  Update 02/24/21 110 fasting sugar this morning, they have all been around this for the most part She denies any more episodes of hypoglycemia and GI symptoms have resolved since switching to the XR formulation of metformin. Continue current meds for now - PCP discussing Jardiance with cardio.  Update 09/08/21 She reports that her glucose is normally between 100-140 fasting when she checks it every morning. She denies any more episodes of hypoglycemia - discussed importance of eating regular meals. A1c has ben trending up over the past year. She has HF so she would be great candidate for SGLT-2 if renal function would allow. Recommend recheck A1c again in May, if continues to increase we could consider addition of FIranor Jardiance. Also, need to monitor GFR - if it is below 30 at next visit the use of metformin is contraindicated. Will  have CMA Fu in 3 months to update status of PCP visit and labs.   Asthma (Goal: control symptoms and prevent exacerbations) -Controlled -Symptoms have not been persistent -Current treatment  Symbicort 80-4.5 mcg/act 2 puffs into the lungs 2 (two) times daily. Appropriate, Effective, Safe, Accessible Ventolin 2 puffs as needed every 6  hours Appropriate, Effective, Safe, Accessible -Reviewed appropriate  inhaler use - no problems noted -Recommended to continue current medication  Update 02/24/21 Patient reports some SOB recently.  She is only using her Symbicort prn.  Counseled on using it daily as maintenance.  Patient is agreeable to plan.  Update 09/08/21 She reports she is using her maintenance inhaler daily. She is however out of her albuterol, will request a refill to be called in to [facility]. Denies any shortness of breath episodes recently. No issues with copays. No changes at this time - refill sent.  Osteopenia (Goal maintain bone density) -Controlled -Last DEXA Scan: 2020 -Patient is not a candidate for pharmacologic treatment -Current treatment  Calcium-vitamin D3 supplementation  -Recommend weight-bearing and muscle strengthening exercises for building and maintaining bone density. -Recommended to continue current medication  Patient Goals/Self-Care Activities Patient will:  - target a minimum of 150 minutes of moderate intensity exercise weekly          Medication Assistance: None required.  Patient affirms current coverage meets needs.  Patient's preferred pharmacy is:  [employer] ([place], [place] [facility] [address] [place] [address] Phone: [phone] Fax: [phone]  [facility] ([facility] ) - [place], [place] [address] Phone: [phone] Fax: [phone]  [id number] [place] -  [address] [place] [id number]: [phone]: [phone]  Follow Up:  Patient agrees to Care Plan and Follow-up. Plan: FU in 6 months Future Appointments  Date Time Provider [place] 09/22/2021  1:20 PM Nahser, PWonda Cheng MD [facility]  12/08/2021  1:30 PM ALeamon Arnt MD [facility]  12/24/2021  2:30 PM GI-[facility]G DX DEXA 1 GI-BCGDG [facility]  06/15/2022  9:00 AM ALeamon Arnt MD [facility] [place] PharmD Clinical Pharmacist  [facility] [place]([phone]

## 2021-09-10 ENCOUNTER — Ambulatory Visit: Payer: Medicare Other | Admitting: Family Medicine

## 2021-09-16 DIAGNOSIS — J4530 Mild persistent asthma, uncomplicated: Secondary | ICD-10-CM | POA: Diagnosis not present

## 2021-09-16 DIAGNOSIS — E1121 Type 2 diabetes mellitus with diabetic nephropathy: Secondary | ICD-10-CM

## 2021-09-21 NOTE — Progress Notes (Signed)
Date:  09/22/2021   ID:  Carla Foster, DOB 30-Sep-1940, MRN [id number]  Patient Location: Home Provider Location: Office  PCP:  Leamon Arnt, MD  Cardiologist:   Brazil Voytko  Electrophysiologist:  None   Evaluation Performed:  Follow-Up Visit  Problems: 1. Hypertension 2. Hyperlipidemia 3. Diabetes mellitus     Carla Foster is a 81 year old female with a history of hypertension and hyperlipidemia. She also has a history of type 2 diabetes mellitus. She's been having problems with elevated blood pressure recently. I saw her in July for hypertension.  She feels fine. She's not had any episodes of chest pain or shortness breath. She continues to lose weight.  She has been told  to watch her salt intake. She used to use a lot of fried bologna.   January 17, 2013:   She is doing well.  No CP , no dyspnea.   Still eating salt.    March 09, 2014:   Carla Foster is doing well.  Walking regularly.  Hard of hearing - bought an expensive heaing aid that does not work well.   No CP or dyspnea.     Aug. 29, 2016:   Seen back today for HTN BP is a bit up today . Had a stomach virus yesterday - better today  Has not been eating any extra salt . No CP or dyspnea   Aug. 29, 2017:   Doing well.   Had some cataract surgery recently .  Stays active.   Mows with a push mower.  Tries to watch her salt    Dec.  6, 2018:      Seen for follow up of her HTN Has had lots of dental work  Does not avoid salty foods  - loves country ham - eats once a week,  Eats salty foods regularly , does not cook for herself   January 11, 2018:   Carla Foster is seen today for follow-up of her hypertension and hyperlipidemia.   Still eats some sausage and ham .    Taking and tolerating all her meds.  Very active.   Was in the hospital with bleeding ulcer .    Received 2 units PRBC. .  Was taking ASA and advil for back pain .  Has a cough - is on Lisinopril  Will change to Losartan    Chief Complaint:  Follow up CHF    History of Present Illness:    Carla Foster is a 81 y.o. female with chronic combined CHF. She was admitted in Jan. 2020 with community acquired pneumonia complicated by acute on chronic diastolic CHF .  Echo showed EF of 60-65%.  Grade 2 diastolic dysfunciton, mild AI,  Severe pulmonary HTN   Is having right leg swelling .  Worse in the eveing.  Better in the am  No swelling in left leg    The patient does not have symptoms concerning for COVID-19 infection (fever, chills, cough, or new shortness of breath).   September 12, 2019: Carla Foster  is seen today for follow-up of her chronic diastolic congestive heart failure.  She has mild aortic insufficiency.  She has severe pulmonary hypertension. Just had right breast surgery and radiation therapy Is recovering well  Surgery seems to be successful  Tries to stay away from salty foods.  Leg swelling is better  BP is a bit elevated .   Will increase Losartan to 100 mg a day   September 06, 2020:  Carla Foster is  seen today for follow-up of her chronic diastolic congestive heart failure, mild aortic.  Seen with Carla Foster today ( granddaughter)  insufficiency, severe pulmonary hypertension and mild hypertension. Blood pressure remains mildly elevated. BP has been well controlled at home  Avoids salt for the most part   September 22, 2021 Carla Foster is seen today for follow up of her chronic diastolic CHF, mild AI HTN Seen with Carla Foster ( daughter )   BP is normal at home.  A bit elevated here  Youngest daughter died of MI in 71.  , age 73  She has severe pulmonary artery HTN by echo in 2020. I offered to have her see our Advanced CHF clinic.   She ws not inclined to see them at this point.   Past Medical History:  Diagnosis Date   Acute blood loss anemia    AKI (acute kidney injury) ([place])    Asthma    CKD (chronic kidney disease) stage 3, GFR 30-59 ml/min (HCC) 1/51/7616   Complication of anesthesia    nausea   Diabetes mellitus    type 2    Family history of upper GI bleeding 09/05/2018   Dr. Carlean Purl, hospitalized: egd 11/2017   GERD (gastroesophageal reflux disease)    GI bleed    H/O: upper GI bleed 09/05/2018   11/2017, Peptic ulcer by EGD, Dr. Carlean Purl; hospitalized   Hematemesis    History of radiation therapy 08/07/19- 09/01/19   Right Breast 15 fx of 2.67 Gy each to total 40.05 Gy. Right breast boost of 5 fx of 2 Gy each to total 10 Gy   Hyperlipidemia    Hypertension    Melena    Osteopenia 04/28/2019   dexa 04/2019 T = - 2.3 L/spine lowest. Recheck 2 years.    PAD (peripheral artery disease) ([place]) - asymptomatic 04/27/2019   [employer]lo ABI's by in home nurse assessment: Bilateral abnl: betweein 0.6 and 0.7 Can't tolerate aspirin   PONV (postoperative nausea and vomiting)    Past Surgical History:  Procedure Laterality Date   ABDOMINAL HYSTERECTOMY     BLADDER SURGERY     BREAST LUMPECTOMY WITH RADIOACTIVE SEED LOCALIZATION Right 07/10/2019   Procedure: RIGHT BREAST LUMPECTOMY WITH RADIOACTIVE SEED LOCALIZATION;  Surgeon: Jovita Kussmaul, MD;  Location: [facility];  Service: General;  Laterality: Right;   BREAST SURGERY     CATARACT EXTRACTION W/ INTRAOCULAR LENS IMPLANT Left 11/13/2015   CATARACT EXTRACTION W/ INTRAOCULAR LENS IMPLANT Right    CERVICAL DISCECTOMY  08/23/00   C5-6, C6-7 anterior cervical diskectomy with fibular bone bank fusion followed by Atlantis anterior cervical plating with the operating microscope  --  SURGEON:  Faythe Ghee, M.D.   ESOPHAGOGASTRODUODENOSCOPY (EGD) WITH PROPOFOL N/A 11/21/2017   Procedure: ESOPHAGOGASTRODUODENOSCOPY (EGD) WITH PROPOFOL;  Surgeon: Gatha Mayer, MD;  Location: [facility];  Service: Endoscopy;  Laterality: N/A;   PARTIAL HYSTERECTOMY     TEE WITHOUT CARDIOVERSION N/A 08/01/2018   Procedure: TRANSESOPHAGEAL ECHOCARDIOGRAM (TEE);  Surgeon: Skeet Latch, MD;  Location[facility] ENDOSCOPY;  Service: Cardiovascular;  Laterality: N/A;     Current  Meds  Medication Sig   albuterol (VENTOLIN HFA) 108 (90 Base) MCG/ACT inhaler Inhale 2 puffs into the lungs every 6 (six) hours as needed for wheezing or shortness of breath.   amLODipine (NORVASC) 10 MG tablet TAKE 1 TABLET BY MOUTH  DAILY   Calcium Carbonate-Vitamin D (CALCIUM 600 + D PO) Take 1 tablet by mouth daily.   carvedilol (COREG)  25 MG tablet Take 1 tablet (25 mg total) by mouth 2 (two) times daily. PLEASE CONTACT THE OFFICE FOR A FOLLOW UP APPOINTMENT 1ST ATTEMPT   diclofenac Sodium (VOLTAREN) 1 % GEL Apply 4 g topically 4 (four) times daily as needed (right knee pain).   doxazosin (CARDURA) 4 MG tablet TAKE 1 TABLET BY MOUTH AT  BEDTIME   fenofibrate 160 MG tablet TAKE 1 TABLET BY MOUTH  DAILY   ferrous sulfate 325 (65 FE) MG tablet Take 1 tablet (325 mg total) by mouth daily.   furosemide (LASIX) 40 MG tablet TAKE 1 TABLET BY MOUTH DAILY   losartan (COZAAR) 100 MG tablet TAKE 1 TABLET BY MOUTH  DAILY   metFORMIN (GLUCOPHAGE XR) 500 MG 24 hr tablet Take 1 tablet (500 mg total) by mouth daily with breakfast.   mirabegron ER (MYRBETRIQ) 50 MG TB24 tablet Take 50 mg by mouth daily.   Multiple Vitamin (MULTIVITAMIN) tablet Take 1 tablet by mouth daily.   simvastatin (ZOCOR) 20 MG tablet TAKE 1 TABLET BY MOUTH AT  BEDTIME   SYMBICORT 80-4.5 MCG/ACT inhaler USE 2 INHALATIONS BY MOUTH  TWICE DAILY   vitamin A 8000 UNIT capsule Take 8,000 Units by mouth daily.    vitamin B-12 (CYANOCOBALAMIN) 500 MCG tablet Take 500 mcg by mouth daily.   vitamin C (ASCORBIC ACID) 500 MG tablet Take 500 mg by mouth daily.     Allergies:   Tiazac [diltiazem hcl]   Social History   Tobacco Use   Smoking status: Former    Packs/day: 1.00    Years: 18.00    Pack years: 18.00    Types: Cigarettes    Quit date: 01/13/1974    Years since quitting: 47.7   Smokeless tobacco: Never  Vaping Use   Vaping Use: Never used  Substance Use Topics   Alcohol use: No   Drug use: No     Family Hx: The  patient's family history includes Asthma in her daughter; Atrial fibrillation in her mother; Cancer in her daughter; Emphysema in her father; Hypertension in her mother; Stroke in her mother.  ROS:   Please see the history of present illness.     All other systems reviewed and are negative.   Prior CV studies:   The following studies were reviewed today:    Labs/Other Tests and Data Reviewed:       Recent Labs: 06/10/2021: ALT 10; BUN 44; Creatinine, Ser 1.67; Hemoglobin 9.5; Platelets 258.0; Potassium 4.5; Sodium 135; TSH 3.29   Recent Lipid Panel Lab Results  Component Value Date/Time   CHOL 138 06/10/2021 09:22 AM   CHOL 131 01/11/2018 10:11 AM   TRIG 91.0 06/10/2021 09:22 AM   HDL 59.80 06/10/2021 09:22 AM   HDL 58 01/11/2018 10:11 AM   CHOLHDL 2 06/10/2021 09:22 AM   LDLCALC 60 06/10/2021 09:22 AM   LDLCALC 59 06/05/2020 10:05 AM    Wt Readings from Last 3 Encounters:  09/22/21 146 lb 6.4 oz (66.4 kg)  06/10/21 147 lb (66.7 kg)  04/01/21 144 lb 3.2 oz (65.4 kg)     Objective:    Physical Exam: Blood pressure (!) 150/82, pulse 80, height '5\' 6"'$  (1.676 m), weight 146 lb 6.4 oz (66.4 kg), SpO2 95 %.  GEN:  Well nourished, well developed in no acute distress HEENT: Normal NECK: No JVD; No carotid bruits LYMPHATICS: No lymphadenopathy CARDIAC: RRR , soft systolic murmur  RESPIRATORY:  Clear to auscultation without rales, wheezing or rhonchi  ABDOMEN: Soft, non-tender, non-distended MUSCULOSKELETAL:  No edema; No deformity  SKIN: Warm and dry NEUROLOGIC:  Alert and oriented x 3  EKG: September 22, 2021: Normal sinus rhythm at 80.  No ST or T wave changes.  ASSESSMENT & PLAN:        Chronic diastolic CHF -    She overall seems to be doing fairly well.  Continue current medications.  She is able to get out and do most of her normal activities without any significant problems.  HTN:  -Blood pressure was normal after weight let her sit and relax for a few  minutes.  3. Pulmonary HTN:    due to her asthma  ( non smoker ) .     I offered to have her see the pulmonary HTN clinic .  She declined.  She gets along very well for the most part        Medication Adjustments/Labs and Tests Ordered: Current medicines are reviewed at length with the patient today.  Concerns regarding medicines are outlined above.   Tests Ordered: No orders of the defined types were placed in this encounter.   Medication Changes: No orders of the defined types were placed in this encounter.   Disposition:  Follow up with APP in 1 year   Signed, Mertie Moores, MD  09/22/2021 1:33 PM    [facility]

## 2021-09-22 ENCOUNTER — Other Ambulatory Visit: Payer: Self-pay

## 2021-09-22 ENCOUNTER — Ambulatory Visit: Payer: Medicare Other | Admitting: Cardiovascular Disease

## 2021-09-22 ENCOUNTER — Encounter: Payer: Self-pay | Admitting: Cardiovascular Disease

## 2021-09-22 VITALS — BP 110/70 | HR 80 | Ht 66.0 in | Wt 146.4 lb

## 2021-09-22 DIAGNOSIS — I110 Hypertensive heart disease with heart failure: Secondary | ICD-10-CM | POA: Diagnosis not present

## 2021-09-22 DIAGNOSIS — I10 Essential (primary) hypertension: Secondary | ICD-10-CM

## 2021-09-22 DIAGNOSIS — I272 Pulmonary hypertension, unspecified: Secondary | ICD-10-CM

## 2021-09-22 DIAGNOSIS — I5032 Chronic diastolic (congestive) heart failure: Secondary | ICD-10-CM

## 2021-09-22 NOTE — Patient Instructions (Signed)
Medication Instructions:  ?Your physician recommends that you continue on your current medications as directed. Please refer to the Current Medication list given to you today. ? ?*If you need a refill on your cardiac medications before your next appointment, please call your pharmacy* ? ? ?Lab Work: ?NONE ?If you have labs (blood work) drawn today and your tests are completely normal, you will receive your results only by: ?MyChart Message (if you have MyChart) OR ?A paper copy in the mail ?If you have any lab test that is abnormal or we need to change your treatment, we will call you to review the results. ? ? ?Testing/Procedures: ?NONE ? ? ?Follow-Up: ?At [facility], you and your health needs are our priority.  As part of our continuing mission to provide you with exceptional heart care, we have created designated Provider Care Teams.  These Care Teams include your primary Cardiologist (physician) and Advanced Practice Providers (APPs -  Physician Assistants and Nurse Practitioners) who all work together to provide you with the care you need, when you need it. ? ?We recommend signing up for the patient portal called "MyChart".  Sign up information is provided on this After Visit Summary.  MyChart is used to connect with patients for Virtual Visits (Telemedicine).  Patients are able to view lab/test results, encounter notes, upcoming appointments, etc.  Non-urgent messages can be sent to your provider as well.   ?To learn more about what you can do with MyChart, go to [email or web address].   ? ?Your next appointment:   ?6 month(s) ? ?The format for your next appointment:   ?In Person ? ?Provider:   ?Robbie Lis, PA-C, Christen Bame, NP, or Richardson Dopp, PA-C ?

## 2021-09-30 ENCOUNTER — Other Ambulatory Visit: Payer: Self-pay | Admitting: Cardiovascular Disease

## 2021-10-06 ENCOUNTER — Telehealth: Payer: Self-pay | Admitting: Family Medicine

## 2021-10-06 NOTE — Telephone Encounter (Signed)
Pt's daughter is requesting an antibiotic for pt stating she has sores in her nose again. Stated she should not need an appt due to this being a reoccurring issue. Please advise ?

## 2021-10-07 ENCOUNTER — Emergency Department (HOSPITAL_COMMUNITY): Payer: Medicare Other

## 2021-10-07 ENCOUNTER — Inpatient Hospital Stay (HOSPITAL_COMMUNITY)
Admission: EM | Admit: 2021-10-07 | Discharge: 2021-10-18 | DRG: 871 | Disposition: E | Payer: Medicare Other | Attending: Internal Medicine | Admitting: Internal Medicine

## 2021-10-07 ENCOUNTER — Other Ambulatory Visit: Payer: Self-pay

## 2021-10-07 ENCOUNTER — Encounter (HOSPITAL_COMMUNITY): Payer: Self-pay

## 2021-10-07 DIAGNOSIS — E785 Hyperlipidemia, unspecified: Secondary | ICD-10-CM | POA: Diagnosis not present

## 2021-10-07 DIAGNOSIS — Z9841 Cataract extraction status, right eye: Secondary | ICD-10-CM

## 2021-10-07 DIAGNOSIS — Z7984 Long term (current) use of oral hypoglycemic drugs: Secondary | ICD-10-CM

## 2021-10-07 DIAGNOSIS — E1122 Type 2 diabetes mellitus with diabetic chronic kidney disease: Secondary | ICD-10-CM | POA: Diagnosis present

## 2021-10-07 DIAGNOSIS — I5033 Acute on chronic diastolic (congestive) heart failure: Secondary | ICD-10-CM | POA: Diagnosis not present

## 2021-10-07 DIAGNOSIS — D638 Anemia in other chronic diseases classified elsewhere: Secondary | ICD-10-CM | POA: Diagnosis not present

## 2021-10-07 DIAGNOSIS — A419 Sepsis, unspecified organism: Secondary | ICD-10-CM | POA: Diagnosis not present

## 2021-10-07 DIAGNOSIS — Z66 Do not resuscitate: Secondary | ICD-10-CM | POA: Diagnosis present

## 2021-10-07 DIAGNOSIS — K219 Gastro-esophageal reflux disease without esophagitis: Secondary | ICD-10-CM | POA: Diagnosis present

## 2021-10-07 DIAGNOSIS — J9601 Acute respiratory failure with hypoxia: Secondary | ICD-10-CM | POA: Diagnosis not present

## 2021-10-07 DIAGNOSIS — I10 Essential (primary) hypertension: Secondary | ICD-10-CM

## 2021-10-07 DIAGNOSIS — J4520 Mild intermittent asthma, uncomplicated: Secondary | ICD-10-CM | POA: Diagnosis present

## 2021-10-07 DIAGNOSIS — Z8711 Personal history of peptic ulcer disease: Secondary | ICD-10-CM

## 2021-10-07 DIAGNOSIS — Z961 Presence of intraocular lens: Secondary | ICD-10-CM | POA: Diagnosis present

## 2021-10-07 DIAGNOSIS — Z9842 Cataract extraction status, left eye: Secondary | ICD-10-CM

## 2021-10-07 DIAGNOSIS — E119 Type 2 diabetes mellitus without complications: Secondary | ICD-10-CM

## 2021-10-07 DIAGNOSIS — D631 Anemia in chronic kidney disease: Secondary | ICD-10-CM | POA: Diagnosis not present

## 2021-10-07 DIAGNOSIS — I5032 Chronic diastolic (congestive) heart failure: Secondary | ICD-10-CM | POA: Diagnosis not present

## 2021-10-07 DIAGNOSIS — N289 Disorder of kidney and ureter, unspecified: Secondary | ICD-10-CM

## 2021-10-07 DIAGNOSIS — Z823 Family history of stroke: Secondary | ICD-10-CM

## 2021-10-07 DIAGNOSIS — Z79899 Other long term (current) drug therapy: Secondary | ICD-10-CM

## 2021-10-07 DIAGNOSIS — Z87891 Personal history of nicotine dependence: Secondary | ICD-10-CM

## 2021-10-07 DIAGNOSIS — J189 Pneumonia, unspecified organism: Secondary | ICD-10-CM | POA: Diagnosis not present

## 2021-10-07 DIAGNOSIS — I272 Pulmonary hypertension, unspecified: Secondary | ICD-10-CM | POA: Diagnosis present

## 2021-10-07 DIAGNOSIS — Z825 Family history of asthma and other chronic lower respiratory diseases: Secondary | ICD-10-CM

## 2021-10-07 DIAGNOSIS — I248 Other forms of acute ischemic heart disease: Secondary | ICD-10-CM | POA: Diagnosis not present

## 2021-10-07 DIAGNOSIS — R778 Other specified abnormalities of plasma proteins: Secondary | ICD-10-CM | POA: Diagnosis not present

## 2021-10-07 DIAGNOSIS — G9341 Metabolic encephalopathy: Secondary | ICD-10-CM | POA: Diagnosis not present

## 2021-10-07 DIAGNOSIS — E872 Acidosis, unspecified: Secondary | ICD-10-CM | POA: Diagnosis present

## 2021-10-07 DIAGNOSIS — E871 Hypo-osmolality and hyponatremia: Secondary | ICD-10-CM | POA: Diagnosis present

## 2021-10-07 DIAGNOSIS — E861 Hypovolemia: Secondary | ICD-10-CM | POA: Diagnosis present

## 2021-10-07 DIAGNOSIS — I130 Hypertensive heart and chronic kidney disease with heart failure and stage 1 through stage 4 chronic kidney disease, or unspecified chronic kidney disease: Secondary | ICD-10-CM | POA: Diagnosis not present

## 2021-10-07 DIAGNOSIS — I517 Cardiomegaly: Secondary | ICD-10-CM | POA: Diagnosis not present

## 2021-10-07 DIAGNOSIS — J1282 Pneumonia due to coronavirus disease 2019: Secondary | ICD-10-CM | POA: Diagnosis present

## 2021-10-07 DIAGNOSIS — R0781 Pleurodynia: Secondary | ICD-10-CM | POA: Diagnosis not present

## 2021-10-07 DIAGNOSIS — N1832 Chronic kidney disease, stage 3b: Secondary | ICD-10-CM | POA: Diagnosis not present

## 2021-10-07 DIAGNOSIS — I959 Hypotension, unspecified: Secondary | ICD-10-CM | POA: Diagnosis not present

## 2021-10-07 DIAGNOSIS — U071 COVID-19: Secondary | ICD-10-CM | POA: Diagnosis not present

## 2021-10-07 DIAGNOSIS — R7989 Other specified abnormal findings of blood chemistry: Secondary | ICD-10-CM | POA: Diagnosis present

## 2021-10-07 DIAGNOSIS — J9811 Atelectasis: Secondary | ICD-10-CM | POA: Diagnosis not present

## 2021-10-07 DIAGNOSIS — L8990 Pressure ulcer of unspecified site, unspecified stage: Secondary | ICD-10-CM | POA: Insufficient documentation

## 2021-10-07 DIAGNOSIS — R680 Hypothermia, not associated with low environmental temperature: Secondary | ICD-10-CM | POA: Diagnosis not present

## 2021-10-07 DIAGNOSIS — E11649 Type 2 diabetes mellitus with hypoglycemia without coma: Secondary | ICD-10-CM | POA: Diagnosis not present

## 2021-10-07 DIAGNOSIS — N183 Chronic kidney disease, stage 3 unspecified: Secondary | ICD-10-CM | POA: Diagnosis not present

## 2021-10-07 DIAGNOSIS — E1151 Type 2 diabetes mellitus with diabetic peripheral angiopathy without gangrene: Secondary | ICD-10-CM | POA: Diagnosis present

## 2021-10-07 DIAGNOSIS — L89311 Pressure ulcer of right buttock, stage 1: Secondary | ICD-10-CM | POA: Diagnosis not present

## 2021-10-07 DIAGNOSIS — N179 Acute kidney failure, unspecified: Secondary | ICD-10-CM | POA: Diagnosis not present

## 2021-10-07 DIAGNOSIS — R0600 Dyspnea, unspecified: Secondary | ICD-10-CM | POA: Diagnosis not present

## 2021-10-07 DIAGNOSIS — N1831 Chronic kidney disease, stage 3a: Secondary | ICD-10-CM | POA: Diagnosis not present

## 2021-10-07 DIAGNOSIS — Z8249 Family history of ischemic heart disease and other diseases of the circulatory system: Secondary | ICD-10-CM

## 2021-10-07 DIAGNOSIS — H9190 Unspecified hearing loss, unspecified ear: Secondary | ICD-10-CM | POA: Diagnosis present

## 2021-10-07 DIAGNOSIS — Z853 Personal history of malignant neoplasm of breast: Secondary | ICD-10-CM

## 2021-10-07 DIAGNOSIS — Z90710 Acquired absence of both cervix and uterus: Secondary | ICD-10-CM

## 2021-10-07 DIAGNOSIS — J159 Unspecified bacterial pneumonia: Secondary | ICD-10-CM | POA: Diagnosis present

## 2021-10-07 DIAGNOSIS — Z888 Allergy status to other drugs, medicaments and biological substances status: Secondary | ICD-10-CM

## 2021-10-07 DIAGNOSIS — L89321 Pressure ulcer of left buttock, stage 1: Secondary | ICD-10-CM | POA: Diagnosis not present

## 2021-10-07 DIAGNOSIS — I071 Rheumatic tricuspid insufficiency: Secondary | ICD-10-CM | POA: Diagnosis present

## 2021-10-07 DIAGNOSIS — J45909 Unspecified asthma, uncomplicated: Secondary | ICD-10-CM | POA: Diagnosis present

## 2021-10-07 DIAGNOSIS — Z743 Need for continuous supervision: Secondary | ICD-10-CM | POA: Diagnosis not present

## 2021-10-07 DIAGNOSIS — R6520 Severe sepsis without septic shock: Secondary | ICD-10-CM | POA: Diagnosis not present

## 2021-10-07 DIAGNOSIS — E1165 Type 2 diabetes mellitus with hyperglycemia: Secondary | ICD-10-CM | POA: Diagnosis present

## 2021-10-07 DIAGNOSIS — R112 Nausea with vomiting, unspecified: Secondary | ICD-10-CM | POA: Diagnosis not present

## 2021-10-07 DIAGNOSIS — Z923 Personal history of irradiation: Secondary | ICD-10-CM

## 2021-10-07 DIAGNOSIS — R059 Cough, unspecified: Secondary | ICD-10-CM | POA: Diagnosis not present

## 2021-10-07 DIAGNOSIS — Z515 Encounter for palliative care: Secondary | ICD-10-CM

## 2021-10-07 DIAGNOSIS — N189 Chronic kidney disease, unspecified: Secondary | ICD-10-CM | POA: Diagnosis not present

## 2021-10-07 DIAGNOSIS — Z7951 Long term (current) use of inhaled steroids: Secondary | ICD-10-CM

## 2021-10-07 DIAGNOSIS — R6889 Other general symptoms and signs: Secondary | ICD-10-CM | POA: Diagnosis not present

## 2021-10-07 LAB — RESP PANEL BY RT-PCR (FLU A&B, COVID) ARPGX2
Influenza A by PCR: NEGATIVE
Influenza B by PCR: NEGATIVE
SARS Coronavirus 2 by RT PCR: POSITIVE — AB

## 2021-10-07 LAB — CBC WITH DIFFERENTIAL/PLATELET
Abs Immature Granulocytes: 0.13 10*3/uL — ABNORMAL HIGH (ref 0.00–0.07)
Basophils Absolute: 0 10*3/uL (ref 0.0–0.1)
Basophils Relative: 0 %
Eosinophils Absolute: 0 10*3/uL (ref 0.0–0.5)
Eosinophils Relative: 0 %
HCT: 33.4 % — ABNORMAL LOW (ref 36.0–46.0)
Hemoglobin: 10.9 g/dL — ABNORMAL LOW (ref 12.0–15.0)
Immature Granulocytes: 1 %
Lymphocytes Relative: 3 %
Lymphs Abs: 0.4 10*3/uL — ABNORMAL LOW (ref 0.7–4.0)
MCH: 28.2 pg (ref 26.0–34.0)
MCHC: 32.6 g/dL (ref 30.0–36.0)
MCV: 86.3 fL (ref 80.0–100.0)
Monocytes Absolute: 0.7 10*3/uL (ref 0.1–1.0)
Monocytes Relative: 5 %
Neutro Abs: 11.4 10*3/uL — ABNORMAL HIGH (ref 1.7–7.7)
Neutrophils Relative %: 91 %
Platelets: 289 10*3/uL (ref 150–400)
RBC: 3.87 MIL/uL (ref 3.87–5.11)
RDW: 13.5 % (ref 11.5–15.5)
WBC: 12.6 10*3/uL — ABNORMAL HIGH (ref 4.0–10.5)
nRBC: 0.2 % (ref 0.0–0.2)

## 2021-10-07 LAB — COMPREHENSIVE METABOLIC PANEL
ALT: 35 U/L (ref 0–44)
AST: 45 U/L — ABNORMAL HIGH (ref 15–41)
Albumin: 3.2 g/dL — ABNORMAL LOW (ref 3.5–5.0)
Alkaline Phosphatase: 40 U/L (ref 38–126)
Anion gap: 11 (ref 5–15)
BUN: 53 mg/dL — ABNORMAL HIGH (ref 8–23)
CO2: 19 mmol/L — ABNORMAL LOW (ref 22–32)
Calcium: 8.6 mg/dL — ABNORMAL LOW (ref 8.9–10.3)
Chloride: 94 mmol/L — ABNORMAL LOW (ref 98–111)
Creatinine, Ser: 1.39 mg/dL — ABNORMAL HIGH (ref 0.44–1.00)
GFR, Estimated: 38 mL/min — ABNORMAL LOW (ref >60)
Glucose, Bld: 193 mg/dL — ABNORMAL HIGH (ref 70–99)
Potassium: 4 mmol/L (ref 3.5–5.1)
Sodium: 124 mmol/L — ABNORMAL LOW (ref 135–145)
Total Bilirubin: 0.6 mg/dL (ref 0.3–1.2)
Total Protein: 7.1 g/dL (ref 6.5–8.1)

## 2021-10-07 LAB — URINALYSIS, ROUTINE W REFLEX MICROSCOPIC
Bilirubin Urine: NEGATIVE
Glucose, UA: NEGATIVE mg/dL
Hgb urine dipstick: NEGATIVE
Ketones, ur: NEGATIVE mg/dL
Nitrite: NEGATIVE
Protein, ur: 100 mg/dL — AB
Specific Gravity, Urine: 1.016 (ref 1.005–1.030)
pH: 5 (ref 5.0–8.0)

## 2021-10-07 LAB — GLUCOSE, CAPILLARY
Glucose-Capillary: 189 mg/dL — ABNORMAL HIGH (ref 70–99)
Glucose-Capillary: 118 mg/dL — ABNORMAL HIGH (ref 70–99)

## 2021-10-07 LAB — RESPIRATORY PANEL BY PCR

## 2021-10-07 LAB — RENAL FUNCTION PANEL
Albumin: 2.9 g/dL — ABNORMAL LOW (ref 3.5–5.0)
Albumin: 2.8 g/dL — ABNORMAL LOW (ref 3.5–5.0)
Anion gap: 8 (ref 5–15)
Anion gap: 8 (ref 5–15)
BUN: 45 mg/dL — ABNORMAL HIGH (ref 8–23)
BUN: 41 mg/dL — ABNORMAL HIGH (ref 8–23)
CO2: 20 mmol/L — ABNORMAL LOW (ref 22–32)
CO2: 18 mmol/L — ABNORMAL LOW (ref 22–32)
Calcium: 8.1 mg/dL — ABNORMAL LOW (ref 8.9–10.3)
Calcium: 7.9 mg/dL — ABNORMAL LOW (ref 8.9–10.3)
Chloride: 98 mmol/L (ref 98–111)
Chloride: 97 mmol/L — ABNORMAL LOW (ref 98–111)
Creatinine, Ser: 1.21 mg/dL — ABNORMAL HIGH (ref 0.44–1.00)
Creatinine, Ser: 1.15 mg/dL — ABNORMAL HIGH (ref 0.44–1.00)
GFR, Estimated: 45 mL/min — ABNORMAL LOW (ref >60)
GFR, Estimated: 48 mL/min — ABNORMAL LOW (ref >60)
Glucose, Bld: 225 mg/dL — ABNORMAL HIGH (ref 70–99)
Glucose, Bld: 97 mg/dL (ref 70–99)
Phosphorus: 2.5 mg/dL (ref 2.5–4.6)
Phosphorus: 2.1 mg/dL — ABNORMAL LOW (ref 2.5–4.6)
Potassium: 4.8 mmol/L (ref 3.5–5.1)
Potassium: 4.2 mmol/L (ref 3.5–5.1)
Sodium: 126 mmol/L — ABNORMAL LOW (ref 135–145)
Sodium: 123 mmol/L — ABNORMAL LOW (ref 135–145)

## 2021-10-07 LAB — OSMOLALITY, URINE: Osmolality, Ur: 411 mOsm/kg (ref 300–900)

## 2021-10-07 LAB — LACTIC ACID, PLASMA: Lactic Acid, Venous: 0.9 mmol/L (ref 0.5–1.9)

## 2021-10-07 LAB — TROPONIN I (HIGH SENSITIVITY)
Troponin I (High Sensitivity): 19 ng/L — ABNORMAL HIGH (ref <18)
Troponin I (High Sensitivity): 22 ng/L — ABNORMAL HIGH (ref <18)

## 2021-10-07 LAB — PROCALCITONIN: Procalcitonin: >150 ng/mL

## 2021-10-07 LAB — SODIUM, URINE, RANDOM: Sodium, Ur: 15 mmol/L

## 2021-10-07 LAB — HEMOGLOBIN A1C
Hgb A1c MFr Bld: 7.4 % — ABNORMAL HIGH (ref 4.8–5.6)
Mean Plasma Glucose: 165.68 mg/dL

## 2021-10-07 LAB — LIPASE, BLOOD: Lipase: 38 U/L (ref 11–51)

## 2021-10-07 LAB — BRAIN NATRIURETIC PEPTIDE: B Natriuretic Peptide: 268.8 pg/mL — ABNORMAL HIGH (ref 0.0–100.0)

## 2021-10-07 MED ORDER — ONDANSETRON HCL 4 MG PO TABS: 4.0000 mg | ORAL_TABLET | Freq: Four times a day (QID) | ORAL | Status: DC | PRN

## 2021-10-07 MED ORDER — AZITHROMYCIN 250 MG PO TABS
500.0000 mg | ORAL_TABLET | Freq: Every day | ORAL | Status: AC
  Administered 2021-10-08 – 2021-10-12 (×5): 500 mg via ORAL
  Filled 2021-10-07 (×5): qty 2

## 2021-10-07 MED ORDER — ALBUTEROL SULFATE (2.5 MG/3ML) 0.083% IN NEBU: 2.5000 mg | INHALATION_SOLUTION | RESPIRATORY_TRACT | Status: DC | PRN

## 2021-10-07 MED ORDER — GUAIFENESIN-DM 100-10 MG/5ML PO SYRP
10.0000 mL | ORAL_SOLUTION | ORAL | Status: DC | PRN
  Administered 2021-10-07 – 2021-10-10 (×2): 10 mL via ORAL
  Filled 2021-10-07 (×2): qty 10

## 2021-10-07 MED ORDER — ENOXAPARIN SODIUM 30 MG/0.3ML IJ SOSY
30.0000 mg | PREFILLED_SYRINGE | INTRAMUSCULAR | Status: DC
  Administered 2021-10-07: 30 mg via SUBCUTANEOUS
  Filled 2021-10-07: qty 0.3

## 2021-10-07 MED ORDER — ASCORBIC ACID 500 MG PO TABS: 500.0000 mg | ORAL_TABLET | Freq: Every day | ORAL | Status: DC

## 2021-10-07 MED ORDER — DOXAZOSIN MESYLATE 1 MG PO TABS
4.0000 mg | ORAL_TABLET | Freq: Every day | ORAL | Status: DC
  Administered 2021-10-07 – 2021-10-13 (×7): 4 mg via ORAL
  Filled 2021-10-07 (×8): qty 2

## 2021-10-07 MED ORDER — IPRATROPIUM-ALBUTEROL 0.5-2.5 (3) MG/3ML IN SOLN
3.0000 mL | Freq: Once | RESPIRATORY_TRACT | Status: AC
  Administered 2021-10-07: 3 mL via RESPIRATORY_TRACT
  Filled 2021-10-07: qty 3

## 2021-10-07 MED ORDER — MOMETASONE FURO-FORMOTEROL FUM 100-5 MCG/ACT IN AERO
2.0000 | INHALATION_SPRAY | Freq: Two times a day (BID) | RESPIRATORY_TRACT | Status: DC
  Administered 2021-10-07 – 2021-10-14 (×14): 2 via RESPIRATORY_TRACT
  Filled 2021-10-07: qty 8.8

## 2021-10-07 MED ORDER — SODIUM CHLORIDE 0.9 % IV SOLN
2.0000 g | INTRAVENOUS | Status: AC
  Administered 2021-10-08 – 2021-10-12 (×5): 2 g via INTRAVENOUS
  Filled 2021-10-07 (×5): qty 20

## 2021-10-07 MED ORDER — FENOFIBRATE 160 MG PO TABS
160.0000 mg | ORAL_TABLET | Freq: Every morning | ORAL | Status: DC
  Administered 2021-10-08 – 2021-10-13 (×6): 160 mg via ORAL
  Filled 2021-10-07 (×7): qty 1

## 2021-10-07 MED ORDER — IPRATROPIUM-ALBUTEROL 0.5-2.5 (3) MG/3ML IN SOLN
3.0000 mL | Freq: Four times a day (QID) | RESPIRATORY_TRACT | Status: DC | PRN
  Administered 2021-10-08 – 2021-10-11 (×2): 3 mL via RESPIRATORY_TRACT
  Filled 2021-10-07 (×2): qty 3

## 2021-10-07 MED ORDER — INSULIN ASPART 100 UNIT/ML IJ SOLN
0.0000 [IU] | Freq: Three times a day (TID) | INTRAMUSCULAR | Status: DC
  Administered 2021-10-07: 2 [IU] via SUBCUTANEOUS
  Administered 2021-10-08 – 2021-10-09 (×4): 1 [IU] via SUBCUTANEOUS
  Administered 2021-10-10: 2 [IU] via SUBCUTANEOUS
  Administered 2021-10-10 – 2021-10-11 (×2): 1 [IU] via SUBCUTANEOUS

## 2021-10-07 MED ORDER — FERROUS SULFATE 325 (65 FE) MG PO TABS
325.0000 mg | ORAL_TABLET | Freq: Every day | ORAL | Status: DC
  Administered 2021-10-08 – 2021-10-13 (×5): 325 mg via ORAL
  Filled 2021-10-07 (×6): qty 1

## 2021-10-07 MED ORDER — ACETAMINOPHEN 500 MG PO TABS
1000.0000 mg | ORAL_TABLET | Freq: Once | ORAL | Status: DC
  Filled 2021-10-07: qty 2

## 2021-10-07 MED ORDER — CARVEDILOL 12.5 MG PO TABS
25.0000 mg | ORAL_TABLET | Freq: Two times a day (BID) | ORAL | Status: DC
  Administered 2021-10-07 – 2021-10-13 (×13): 25 mg via ORAL
  Filled 2021-10-07 (×13): qty 1

## 2021-10-07 MED ORDER — SIMVASTATIN 10 MG PO TABS
20.0000 mg | ORAL_TABLET | Freq: Every day | ORAL | Status: DC
  Administered 2021-10-07 – 2021-10-13 (×7): 20 mg via ORAL
  Filled 2021-10-07 (×8): qty 1

## 2021-10-07 MED ORDER — LOSARTAN POTASSIUM 50 MG PO TABS: 100.0000 mg | ORAL_TABLET | Freq: Every morning | ORAL | Status: DC

## 2021-10-07 MED ORDER — SODIUM CHLORIDE 0.9 % IV SOLN
1.0000 g | Freq: Once | INTRAVENOUS | Status: AC
  Administered 2021-10-07: 1 g via INTRAVENOUS
  Filled 2021-10-07: qty 10

## 2021-10-07 MED ORDER — ENOXAPARIN SODIUM 40 MG/0.4ML IJ SOSY
40.0000 mg | PREFILLED_SYRINGE | INTRAMUSCULAR | Status: DC
  Administered 2021-10-08 – 2021-10-09 (×2): 40 mg via SUBCUTANEOUS
  Filled 2021-10-07 (×2): qty 0.4

## 2021-10-07 MED ORDER — DIPHENHYDRAMINE HCL 25 MG PO CAPS
25.0000 mg | ORAL_CAPSULE | Freq: Four times a day (QID) | ORAL | Status: DC | PRN
  Administered 2021-10-07: 25 mg via ORAL
  Filled 2021-10-07 (×2): qty 1

## 2021-10-07 MED ORDER — ONDANSETRON HCL 4 MG/2ML IJ SOLN: 4.0000 mg | Freq: Four times a day (QID) | INTRAMUSCULAR | Status: DC | PRN

## 2021-10-07 MED ORDER — AMLODIPINE BESYLATE 10 MG PO TABS
10.0000 mg | ORAL_TABLET | Freq: Every morning | ORAL | Status: DC
  Administered 2021-10-08 – 2021-10-13 (×6): 10 mg via ORAL
  Filled 2021-10-07 (×6): qty 1

## 2021-10-07 MED ORDER — CALCIUM CARBONATE ANTACID 500 MG PO CHEW
1.0000 | CHEWABLE_TABLET | Freq: Four times a day (QID) | ORAL | Status: DC | PRN
  Administered 2021-10-07 – 2021-10-13 (×4): 200 mg via ORAL
  Filled 2021-10-07 (×4): qty 1

## 2021-10-07 MED ORDER — OXYCODONE HCL 5 MG PO TABS
5.0000 mg | ORAL_TABLET | ORAL | Status: DC | PRN
  Administered 2021-10-08 – 2021-10-10 (×3): 5 mg via ORAL
  Filled 2021-10-07 (×3): qty 1

## 2021-10-07 MED ORDER — INSULIN ASPART 100 UNIT/ML IJ SOLN
0.0000 [IU] | Freq: Every day | INTRAMUSCULAR | Status: DC
  Administered 2021-10-08: 2 [IU] via SUBCUTANEOUS

## 2021-10-07 MED ORDER — SODIUM CHLORIDE 0.9 % IV SOLN
500.0000 mg | Freq: Once | INTRAVENOUS | Status: AC
  Administered 2021-10-07: 500 mg via INTRAVENOUS
  Filled 2021-10-07: qty 5

## 2021-10-07 MED ORDER — ZINC SULFATE 220 (50 ZN) MG PO CAPS
220.0000 mg | ORAL_CAPSULE | Freq: Every day | ORAL | Status: DC
  Administered 2021-10-07 – 2021-10-13 (×7): 220 mg via ORAL
  Filled 2021-10-07 (×7): qty 1

## 2021-10-07 MED ORDER — HYDROCOD POLI-CHLORPHE POLI ER 10-8 MG/5ML PO SUER
5.0000 mL | Freq: Two times a day (BID) | ORAL | Status: DC | PRN
  Administered 2021-10-07 – 2021-10-13 (×3): 5 mL via ORAL
  Filled 2021-10-07 (×4): qty 5

## 2021-10-07 MED ORDER — SODIUM CHLORIDE 0.9 % IV SOLN: Freq: Once | INTRAVENOUS | Status: AC

## 2021-10-07 MED ORDER — ASCORBIC ACID 500 MG PO TABS
500.0000 mg | ORAL_TABLET | Freq: Every day | ORAL | Status: DC
  Administered 2021-10-07 – 2021-10-13 (×6): 500 mg via ORAL
  Filled 2021-10-07 (×7): qty 1

## 2021-10-07 NOTE — Progress Notes (Signed)
Completed ordered evaluation. Ordered IS for CXR 09/22/2021, Albuterol Q4 PRN, and left Sticky Note for MD to order Asthma maintenance med (takes Symbicort at home). ?

## 2021-10-07 NOTE — ED Notes (Signed)
Hospitalist at bedside 

## 2021-10-07 NOTE — ED Notes (Signed)
Lab notified of urine and lab add-on.  ?

## 2021-10-07 NOTE — ED Notes (Signed)
Lab notified of Respiratory panel.   ?

## 2021-10-07 NOTE — Telephone Encounter (Signed)
Please advise 

## 2021-10-07 NOTE — ED Provider Notes (Signed)
?[facility] DEPT ?Provider Note ? ? ?CSN: [id number] ?Arrival date & time: 09/20/2021  7371 ? ?  ? ?History ? ?Chief Complaint  ?Patient presents with  ? Cough  ? ? ?Carla Foster is a 81 y.o. female. ? ?HPI ?Carla Foster is a 81 year old female with a history of hypertension and hyperlipidemia. She also has a history of type 2 diabetes mellitus.  Patient reports started getting sick 1 week ago.  Symptoms first started with diarrhea.  That subsequently resolved.  Patient reports she has had a cough, nasal congestion and sinus fullness for about a week.  She reports the cough is productive of "dirty stuff".  She has felt short of breath.  She has been able to do her usual household activities but is used a walker this week and been more short of breath.  He reports starting yesterday she started getting a constant chest pain on the right lower rib area.  She reports it is constantly achy worse with cough.  Reports she also feels like she has heartburn now.  She denies she has been vomiting but has been "spitting up".  No pain or swelling in the legs.  She reports her daughters live on either side of her and one of them has had cough recently.  Patient reports she uses Symbicort routinely.  She reports she has run out of albuterol.  She is a non-smoker.  Not oxygen dependent.  History of asthma and pulmonary hypertension. ?  ? ?Home Medications ?Prior to Admission medications   ?Medication Sig Start Date End Date Taking? Authorizing Provider  ?albuterol (VENTOLIN HFA) 108 (90 Base) MCG/ACT inhaler Inhale 2 puffs into the lungs every 6 (six) hours as needed for wheezing or shortness of breath. 09/08/21   Leamon Arnt, MD  ?amLODipine (NORVASC) 10 MG tablet TAKE 1 TABLET BY MOUTH  DAILY 07/28/21   Leamon Arnt, MD  ?Calcium Carbonate-Vitamin D (CALCIUM 600 + D PO) Take 1 tablet by mouth daily.    [provider]  ?carvedilol (COREG) 25 MG tablet Take 1 tablet (25 mg total) by mouth  2 (two) times daily with a meal. 10/01/21   Nahser, Wonda Cheng, MD  ?diclofenac Sodium (VOLTAREN) 1 % GEL Apply 4 g topically 4 (four) times daily as needed (right knee pain). 10/27/19   Leamon Arnt, MD  ?doxazosin (CARDURA) 4 MG tablet TAKE 1 TABLET BY MOUTH AT  BEDTIME 08/08/21   Nahser, Wonda Cheng, MD  ?fenofibrate 160 MG tablet TAKE 1 TABLET BY MOUTH  DAILY 03/25/21   Leamon Arnt, MD  ?ferrous sulfate 325 (65 FE) MG tablet Take 1 tablet (325 mg total) by mouth daily. 04/10/19   Leamon Arnt, MD  ?furosemide (LASIX) 40 MG tablet TAKE 1 TABLET BY MOUTH DAILY 10/07/20   Leamon Arnt, MD  ?losartan (COZAAR) 100 MG tablet TAKE 1 TABLET BY MOUTH  DAILY 09/08/21   Nahser, Wonda Cheng, MD  ?metFORMIN (GLUCOPHAGE XR) 500 MG 24 hr tablet Take 1 tablet (500 mg total) by mouth daily with breakfast. 05/12/21   Leamon Arnt, MD  ?mirabegron ER (MYRBETRIQ) 50 MG TB24 tablet Take 50 mg by mouth daily.    [provider]  ?Multiple Vitamin (MULTIVITAMIN) tablet Take 1 tablet by mouth daily.    [provider]  ?simvastatin (ZOCOR) 20 MG tablet TAKE 1 TABLET BY MOUTH AT  BEDTIME 01/06/21   Leamon Arnt, MD  ?[facility] 80-4.5 MCG/ACT inhaler USE  2 INHALATIONS BY MOUTH  TWICE DAILY 06/02/21   Leamon Arnt, MD  ?vitamin A 8000 UNIT capsule Take 8,000 Units by mouth daily.  08/27/15   [provider]  ?vitamin B-12 (CYANOCOBALAMIN) 500 MCG tablet Take 500 mcg by mouth daily.    [provider]  ?vitamin C (ASCORBIC ACID) 500 MG tablet Take 500 mg by mouth daily.    [provider]  ?   ? ?Allergies    ?Tiazac [diltiazem hcl]   ? ?Review of Systems   ?Review of Systems ?10 systems reviewed negative except as per HPI ?Physical Exam ?Updated Vital Signs ?BP (!) 162/66   Pulse 97   Temp (!) 97.4 ?F (36.3 ?C) (Oral)   Resp 14   Ht '5\' 6"'$  (1.676 m)   Wt 66.2 kg   SpO2 97%   BMI 23.57 kg/m?  ?Physical Exam ?Constitutional:   ?   Comments: Alert.  Mild tachypnea.  Nontoxic.  ?HENT:  ?    Mouth/Throat:  ?   Pharynx: Oropharynx is clear.  ?Eyes:  ?   Extraocular Movements: Extraocular movements intact.  ?Cardiovascular:  ?   Rate and Rhythm: Normal rate and regular rhythm.  ?Pulmonary:  ?   Comments: Mild tachypnea.  Lungs grossly clear.  Soft breath sounds at bases.  Chest wall normal without rashes or lesions.  Nontender to palpation. ?Abdominal:  ?   General: There is no distension.  ?   Palpations: Abdomen is soft.  ?   Tenderness: There is no abdominal tenderness. There is no guarding.  ?Musculoskeletal:     ?   General: No swelling or tenderness. Normal range of motion.  ?   Right lower leg: No edema.  ?   Left lower leg: No edema.  ?Skin: ?   General: Skin is warm and dry.  ?Neurological:  ?   General: No focal deficit present.  ?   Mental Status: She is oriented to person, place, and time.  ?   Coordination: Coordination normal.  ?Psychiatric:     ?   Mood and Affect: Mood normal.  ? ? ?ED Results / Procedures / Treatments   ?Labs ?(all labs ordered are listed, but only abnormal results are displayed) ?Labs Reviewed  ?BRAIN NATRIURETIC PEPTIDE - Abnormal; Notable for the following components:  ?    Result Value  ? B Natriuretic Peptide 268.8 (*)   ? All other components within normal limits  ?COMPREHENSIVE METABOLIC PANEL - Abnormal; Notable for the following components:  ? Sodium 124 (*)   ? Chloride 94 (*)   ? CO2 19 (*)   ? Glucose, Bld 193 (*)   ? BUN 53 (*)   ? Creatinine, Ser 1.39 (*)   ? Calcium 8.6 (*)   ? Albumin 3.2 (*)   ? AST 45 (*)   ? GFR, Estimated 38 (*)   ? All other components within normal limits  ?CBC WITH DIFFERENTIAL/PLATELET - Abnormal; Notable for the following components:  ? WBC 12.6 (*)   ? Hemoglobin 10.9 (*)   ? HCT 33.4 (*)   ? Neutro Abs 11.4 (*)   ? Lymphs Abs 0.4 (*)   ? Abs Immature Granulocytes 0.13 (*)   ? All other components within normal limits  ?TROPONIN I (HIGH SENSITIVITY) - Abnormal; Notable for the following components:  ? Troponin I (High  Sensitivity) 19 (*)   ? All other components within normal limits  ?RESP PANEL BY RT-PCR (FLU A&B, COVID)  ARPGX2  ?RESPIRATORY PANEL BY PCR  ?LACTIC ACID, PLASMA  ?LIPASE, BLOOD  ?URINALYSIS, ROUTINE W REFLEX MICROSCOPIC  ?LEGIONELLA PNEUMOPHILA SEROGP 1 UR AG  ?TROPONIN I (HIGH SENSITIVITY)  ? ? ?EKG ?EKG Interpretation ? ?Date/Time:  Tuesday October 07 2021 47:65:46 EDT ?Ventricular Rate:  88 ?PR Interval:  170 ?QRS Duration: 101 ?QT Interval:  384 ?QTC Calculation: 465 ?R Axis:   57 ?Text Interpretation: Sinus rhythm Borderline low voltage, extremity leads no sig change from previous Confirmed by Charlesetta Shanks [phone]) on 09/19/2021 8:45:44 AM ? ?Radiology ?DG Chest Port 1 View ? ?Result Date: 10/13/2021 ?CLINICAL DATA:  cough EXAM: PORTABLE CHEST - 1 VIEW COMPARISON:  08/01/2018 FINDINGS: Cardiomediastinal silhouette and pulmonary vasculature are within normal limits. Significant interval improvement in aeration of the lungs since prior examination. Mild patchy opacity remaining at the lung bases likely due to atelectasis. ACDF changes seen in the lower cervical spine. Interval removal of right IJ central venous catheter. IMPRESSION: Mild patchy opacities at the lung bases most likely due to atelectasis. Pneumonia and pulmonary edema considered less likely. Electronically Signed   By: Miachel Roux M.D.   On: 10/03/2021 08:08   ? ?Procedures ?Procedures  ? ? ?Medications Ordered in ED ?Medications  ?acetaminophen (TYLENOL) tablet 1,000 mg (1,000 mg Oral Not Given 10/09/2021 0740)  ?cefTRIAXone (ROCEPHIN) 1 g in sodium chloride 0.9 % 100 mL IVPB (1 g Intravenous New Bag/Given 10/06/2021 0939)  ?azithromycin (ZITHROMAX) 500 mg in sodium chloride 0.9 % 250 mL IVPB (has no administration in time range)  ?0.9 %  sodium chloride infusion (has no administration in time range)  ?ipratropium-albuterol (DUONEB) 0.5-2.5 (3) MG/3ML nebulizer solution 3 mL (3 mLs Nebulization Given 09/23/2021 0742)  ? ? ?ED Course/ Medical Decision  Making/ A&P ?  ?                        ?Medical Decision Making ?Amount and/or Complexity of Data Reviewed ?Labs: ordered. ?Radiology: ordered. ? ?Risk ?OTC drugs. ?Prescription drug management. ?Decision regarding hospita

## 2021-10-07 NOTE — ED Triage Notes (Signed)
EMS reports patient has had a productive cough and weakness for one weak. States she is chronically short of breath due to asthma. ?

## 2021-10-07 NOTE — ED Notes (Signed)
Pt reports feeling a little better after breathing treatment.  ?

## 2021-10-07 NOTE — H&P (Addendum)
?History and Physical  ? ? ?Patient: Carla Foster XBM:[id number] DOB: [birth year] ?DOA: 09/28/2021 ?DOS: the patient was seen and examined on 09/17/2021 ?PCP: Leamon Arnt, MD  ?Patient coming from: Home ? ?Chief Complaint:  ?Chief Complaint  ?Patient presents with  ? Cough  ? ?HPI: Carla Foster is a 81 y.o. female with medical history significant of HTN, HLD, DM2, pHTN, CKD3b, Hx of breast cancer. Presenting with cough, congestion and dyspnea. Her symptoms started 10 days ago with cough. She had no fever at the time. She did have a sick contact. She tried her home inhalers, some OTC cough meds, and APAP, but her symptoms didn't improve. As the days progressed, she became more dyspneic. She has required more assistance getting around the house. She is now using a walker. Her cough has become productive and she has had some right side chest pain with that cough. When her symptoms didn't resolve this morning, she decided to come to the ED for help.  ? ?Review of Systems: As mentioned in the history of present illness. All other systems reviewed and are negative. ?Past Medical History:  ?Diagnosis Date  ? Acute blood loss anemia   ? AKI (acute kidney injury) ([place])   ? Asthma   ? CKD (chronic kidney disease) stage 3, GFR 30-59 ml/min ([place]) 01/06/2019  ? Complication of anesthesia   ? nausea  ? Diabetes mellitus   ? type 2  ? Family history of upper GI bleeding 09/05/2018  ? Dr. Carlean Purl, hospitalized: egd 11/2017  ? GERD (gastroesophageal reflux disease)   ? GI bleed   ? H/O: upper GI bleed 09/05/2018  ? 11/2017, Peptic ulcer by EGD, Dr. Carlean Purl; hospitalized  ? Hematemesis   ? History of radiation therapy 08/07/19- 09/01/19  ? Right Breast 15 fx of 2.67 Gy each to total 40.05 Gy. Right breast boost of 5 fx of 2 Gy each to total 10 Gy  ? Hyperlipidemia   ? Hypertension   ? Melena   ? Osteopenia 04/28/2019  ? dexa 04/2019 T = - 2.3 L/spine lowest. Recheck 2 years.   ? PAD (peripheral artery disease) ([place]) - asymptomatic  04/27/2019  ? [employer] ABI's by in home nurse assessment: Bilateral abnl: betweein 0.6 and 0.7 Can't tolerate aspirin  ? PONV (postoperative nausea and vomiting)   ? ?Past Surgical History:  ?Procedure Laterality Date  ? ABDOMINAL HYSTERECTOMY    ? BLADDER SURGERY    ? BREAST LUMPECTOMY WITH RADIOACTIVE SEED LOCALIZATION Right 07/10/2019  ? Procedure: RIGHT BREAST LUMPECTOMY WITH RADIOACTIVE SEED LOCALIZATION;  Surgeon: Jovita Kussmaul, MD;  Location: [facility];  Service: General;  Laterality: Right;  ? BREAST SURGERY    ? CATARACT EXTRACTION W/ INTRAOCULAR LENS IMPLANT Left 11/13/2015  ? CATARACT EXTRACTION W/ INTRAOCULAR LENS IMPLANT Right   ? CERVICAL DISCECTOMY  08/23/00  ? C5-6, C6-7 anterior cervical diskectomy with fibular bone bank fusion followed by Atlantis anterior cervical plating with the operating microscope  --  SURGEON:  Faythe Ghee, M.D.  ? ESOPHAGOGASTRODUODENOSCOPY (EGD) WITH PROPOFOL N/A 11/21/2017  ? Procedure: ESOPHAGOGASTRODUODENOSCOPY (EGD) WITH PROPOFOL;  Surgeon: Gatha Mayer, MD;  Location: [facility];  Service: Endoscopy;  Laterality: N/A;  ? PARTIAL HYSTERECTOMY    ? TEE WITHOUT CARDIOVERSION N/A 08/01/2018  ? Procedure: TRANSESOPHAGEAL ECHOCARDIOGRAM (TEE);  Surgeon: Skeet Latch, MD;  Location: [facility];  Service: Cardiovascular;  Laterality: N/A;  ? ?Social History:  reports that she quit smoking about 47 years ago.  Her smoking use included cigarettes. She has a 18.00 pack-year smoking history. She has never used smokeless tobacco. She reports that she does not drink alcohol and does not use drugs. ? ?Allergies  ?Allergen Reactions  ? Tiazac [Diltiazem Hcl] Other (See Comments)  ?  Ankle edema  ? ? ?Family History  ?Problem Relation Age of Onset  ? Emphysema Father   ? Stroke Mother   ? Hypertension Mother   ? Atrial fibrillation Mother   ?     heart flutter  ? Asthma Daughter   ? Cancer Daughter   ? ? ?Prior to Admission medications   ?Medication Sig  Start Date End Date Taking? Authorizing Provider  ?albuterol (VENTOLIN HFA) 108 (90 Base) MCG/ACT inhaler Inhale 2 puffs into the lungs every 6 (six) hours as needed for wheezing or shortness of breath. 09/08/21   Leamon Arnt, MD  ?amLODipine (NORVASC) 10 MG tablet TAKE 1 TABLET BY MOUTH  DAILY 07/28/21   Leamon Arnt, MD  ?Calcium Carbonate-Vitamin D (CALCIUM 600 + D PO) Take 1 tablet by mouth daily.    [provider]  ?carvedilol (COREG) 25 MG tablet Take 1 tablet (25 mg total) by mouth 2 (two) times daily with a meal. 10/01/21   Nahser, Wonda Cheng, MD  ?diclofenac Sodium (VOLTAREN) 1 % GEL Apply 4 g topically 4 (four) times daily as needed (right knee pain). 10/27/19   Leamon Arnt, MD  ?doxazosin (CARDURA) 4 MG tablet TAKE 1 TABLET BY MOUTH AT  BEDTIME 08/08/21   Nahser, Wonda Cheng, MD  ?fenofibrate 160 MG tablet TAKE 1 TABLET BY MOUTH  DAILY 03/25/21   Leamon Arnt, MD  ?ferrous sulfate 325 (65 FE) MG tablet Take 1 tablet (325 mg total) by mouth daily. 04/10/19   Leamon Arnt, MD  ?furosemide (LASIX) 40 MG tablet TAKE 1 TABLET BY MOUTH DAILY 10/07/20   Leamon Arnt, MD  ?losartan (COZAAR) 100 MG tablet TAKE 1 TABLET BY MOUTH  DAILY 09/08/21   Nahser, Wonda Cheng, MD  ?metFORMIN (GLUCOPHAGE XR) 500 MG 24 hr tablet Take 1 tablet (500 mg total) by mouth daily with breakfast. 05/12/21   Leamon Arnt, MD  ?mirabegron ER (MYRBETRIQ) 50 MG TB24 tablet Take 50 mg by mouth daily.    [provider]  ?Multiple Vitamin (MULTIVITAMIN) tablet Take 1 tablet by mouth daily.    [provider]  ?simvastatin (ZOCOR) 20 MG tablet TAKE 1 TABLET BY MOUTH AT  BEDTIME 01/06/21   Leamon Arnt, MD  ?[facility] 80-4.5 MCG/ACT inhaler USE 2 INHALATIONS BY MOUTH  TWICE DAILY 06/02/21   Leamon Arnt, MD  ?vitamin A 8000 UNIT capsule Take 8,000 Units by mouth daily.  08/27/15   [provider]  ?vitamin B-12 (CYANOCOBALAMIN) 500 MCG tablet Take 500 mcg by mouth daily.    [provider]   ?vitamin C (ASCORBIC ACID) 500 MG tablet Take 500 mg by mouth daily.    [provider]  ? ? ?Physical Exam: ?Vitals:  ? 09/26/2021 0730 10/07/2021 0800 09/25/2021 0830 09/20/2021 0950  ?BP: (!) 175/63 (!) 156/68 (!) 162/66 (!) 154/59  ?Pulse: 89 84 97 86  ?Resp: '20 15 14 19  '$ ?Temp:      ?TempSrc:      ?SpO2: 96% 100% 97% 100%  ?Weight:      ?Height:      ? ?General: 81 y.o. female resting in bed in NAD ?Eyes: PERRL, normal sclera ?ENMT: Nares  patent w/o discharge, orophaynx clear, dentition normal, ears w/o discharge/lesions/ulcers ?Neck: Supple, trachea midline ?Cardiovascular: RRR, +S1, S2, no m/g/r, equal pulses throughout, reproducible chest pain ?Respiratory: decreased at bases, soft LUL exp wheeze, no r/r, slightly increase WOB on RA ?GI: BS+, NDNT, no masses noted, no organomegaly noted ?MSK: No e/c/c ?Neuro: A&O x 3, other than HoH no focal deficits ?Psyc: Appropriate interaction and affect, calm/cooperative ? ?Data Reviewed: ? ?Na+  124 ?Cl-  94 ?CO2  19 ?BUN  53 ?Scr  1.39 ?WBC  12.6 ?Trp 19 -> 22 ?COVD positive, flu negative ?CXR: Mild patchy opacities at the lung bases most likely due to ?atelectasis. Pneumonia and pulmonary edema considered less likely. ?EKG: sinus, no st elevations ? ?Assessment and Plan: ?No notes have been filed under this hospital service. ?Service: Hospitalist ?Community acquired PNA ?COVID 19 infection ?Pleuritic pain ?    - admit to inpt, tele ?    - outside the window for COVID anti-virals ?    - continue nebs, guaifenesin, incentive spiro ?    - follow inflammatory markers ?    - she was started on rocephin, zithro for CAP; will continue for now, check procal; if negative, can hold abx ?    - urine strep, legionella ? ?Hyponatremia ?    - she is euvolemic to hypovolemic right now ?    - check urine Na+ and urine osmo ?    - fluids for now ?    - q6H renal function panel; limit Na+ increase to 8 - 10 pt/day ? ?Elevated BNP ?Elevated troponin ?    - EKG is sinus w/ no st  elevation ?    - right side chest pain that is reproducible on exam ?    - she is hypovolemic  ?    - will monitor on tele for now ? ?DM2 ?    - A1c, SSI, glucose checks, DM diet ? ?CKD 3b ?    - she is slightly dry;

## 2021-10-08 DIAGNOSIS — I5032 Chronic diastolic (congestive) heart failure: Secondary | ICD-10-CM

## 2021-10-08 DIAGNOSIS — E871 Hypo-osmolality and hyponatremia: Secondary | ICD-10-CM | POA: Diagnosis not present

## 2021-10-08 DIAGNOSIS — N1831 Chronic kidney disease, stage 3a: Secondary | ICD-10-CM

## 2021-10-08 DIAGNOSIS — J189 Pneumonia, unspecified organism: Secondary | ICD-10-CM

## 2021-10-08 DIAGNOSIS — A419 Sepsis, unspecified organism: Secondary | ICD-10-CM

## 2021-10-08 DIAGNOSIS — N289 Disorder of kidney and ureter, unspecified: Secondary | ICD-10-CM

## 2021-10-08 DIAGNOSIS — R7989 Other specified abnormal findings of blood chemistry: Secondary | ICD-10-CM

## 2021-10-08 DIAGNOSIS — N179 Acute kidney failure, unspecified: Secondary | ICD-10-CM

## 2021-10-08 DIAGNOSIS — R6520 Severe sepsis without septic shock: Secondary | ICD-10-CM

## 2021-10-08 LAB — RENAL FUNCTION PANEL
Albumin: 2.7 g/dL — ABNORMAL LOW (ref 3.5–5.0)
Albumin: 2.5 g/dL — ABNORMAL LOW (ref 3.5–5.0)
Albumin: 2.5 g/dL — ABNORMAL LOW (ref 3.5–5.0)
Anion gap: 13 (ref 5–15)
Anion gap: 10 (ref 5–15)
Anion gap: 7 (ref 5–15)
BUN: 40 mg/dL — ABNORMAL HIGH (ref 8–23)
BUN: 41 mg/dL — ABNORMAL HIGH (ref 8–23)
BUN: 41 mg/dL — ABNORMAL HIGH (ref 8–23)
CO2: 14 mmol/L — ABNORMAL LOW (ref 22–32)
CO2: 20 mmol/L — ABNORMAL LOW (ref 22–32)
CO2: 20 mmol/L — ABNORMAL LOW (ref 22–32)
Calcium: 8.2 mg/dL — ABNORMAL LOW (ref 8.9–10.3)
Calcium: 8.2 mg/dL — ABNORMAL LOW (ref 8.9–10.3)
Calcium: 8 mg/dL — ABNORMAL LOW (ref 8.9–10.3)
Chloride: 97 mmol/L — ABNORMAL LOW (ref 98–111)
Chloride: 95 mmol/L — ABNORMAL LOW (ref 98–111)
Chloride: 99 mmol/L (ref 98–111)
Creatinine, Ser: 1.17 mg/dL — ABNORMAL HIGH (ref 0.44–1.00)
Creatinine, Ser: 1.28 mg/dL — ABNORMAL HIGH (ref 0.44–1.00)
Creatinine, Ser: 1.24 mg/dL — ABNORMAL HIGH (ref 0.44–1.00)
GFR, Estimated: 47 mL/min — ABNORMAL LOW (ref >60)
GFR, Estimated: 42 mL/min — ABNORMAL LOW (ref >60)
GFR, Estimated: 43 mL/min — ABNORMAL LOW (ref >60)
Glucose, Bld: 157 mg/dL — ABNORMAL HIGH (ref 70–99)
Glucose, Bld: 176 mg/dL — ABNORMAL HIGH (ref 70–99)
Glucose, Bld: 75 mg/dL (ref 70–99)
Phosphorus: 2.2 mg/dL — ABNORMAL LOW (ref 2.5–4.6)
Phosphorus: 2.4 mg/dL — ABNORMAL LOW (ref 2.5–4.6)
Phosphorus: 2 mg/dL — ABNORMAL LOW (ref 2.5–4.6)
Potassium: 4.2 mmol/L (ref 3.5–5.1)
Potassium: 4.4 mmol/L (ref 3.5–5.1)
Potassium: 4.1 mmol/L (ref 3.5–5.1)
Sodium: 124 mmol/L — ABNORMAL LOW (ref 135–145)
Sodium: 125 mmol/L — ABNORMAL LOW (ref 135–145)
Sodium: 126 mmol/L — ABNORMAL LOW (ref 135–145)

## 2021-10-08 LAB — D-DIMER, QUANTITATIVE: D-Dimer, Quant: 2.64 ug/mL-FEU — ABNORMAL HIGH (ref 0.00–0.50)

## 2021-10-08 LAB — CBC WITH DIFFERENTIAL/PLATELET
Abs Immature Granulocytes: 0.09 10*3/uL — ABNORMAL HIGH (ref 0.00–0.07)
Basophils Absolute: 0 10*3/uL (ref 0.0–0.1)
Basophils Relative: 0 %
Eosinophils Absolute: 0 10*3/uL (ref 0.0–0.5)
Eosinophils Relative: 0 %
HCT: 31.3 % — ABNORMAL LOW (ref 36.0–46.0)
Hemoglobin: 10 g/dL — ABNORMAL LOW (ref 12.0–15.0)
Immature Granulocytes: 1 %
Lymphocytes Relative: 4 %
Lymphs Abs: 0.5 10*3/uL — ABNORMAL LOW (ref 0.7–4.0)
MCH: 28.5 pg (ref 26.0–34.0)
MCHC: 31.9 g/dL (ref 30.0–36.0)
MCV: 89.2 fL (ref 80.0–100.0)
Monocytes Absolute: 0.9 10*3/uL (ref 0.1–1.0)
Monocytes Relative: 7 %
Neutro Abs: 11.2 10*3/uL — ABNORMAL HIGH (ref 1.7–7.7)
Neutrophils Relative %: 88 %
Platelets: 299 10*3/uL (ref 150–400)
RBC: 3.51 MIL/uL — ABNORMAL LOW (ref 3.87–5.11)
RDW: 13.8 % (ref 11.5–15.5)
WBC: 12.6 10*3/uL — ABNORMAL HIGH (ref 4.0–10.5)
nRBC: 0.2 % (ref 0.0–0.2)

## 2021-10-08 LAB — FERRITIN: Ferritin: 246 ng/mL (ref 11–307)

## 2021-10-08 LAB — GLUCOSE, CAPILLARY
Glucose-Capillary: 135 mg/dL — ABNORMAL HIGH (ref 70–99)
Glucose-Capillary: 117 mg/dL — ABNORMAL HIGH (ref 70–99)
Glucose-Capillary: 60 mg/dL — ABNORMAL LOW (ref 70–99)
Glucose-Capillary: 160 mg/dL — ABNORMAL HIGH (ref 70–99)
Glucose-Capillary: 209 mg/dL — ABNORMAL HIGH (ref 70–99)

## 2021-10-08 LAB — MAGNESIUM: Magnesium: 2.1 mg/dL (ref 1.7–2.4)

## 2021-10-08 LAB — LEGIONELLA PNEUMOPHILA SEROGP 1 UR AG: L. pneumophila Serogp 1 Ur Ag: NEGATIVE

## 2021-10-08 LAB — C-REACTIVE PROTEIN: CRP: 18.8 mg/dL — ABNORMAL HIGH (ref <1.0)

## 2021-10-08 MED ORDER — MUPIROCIN 2 % EX OINT
TOPICAL_OINTMENT | Freq: Two times a day (BID) | CUTANEOUS | Status: DC
  Administered 2021-10-10 – 2021-10-15 (×3): 1 via NASAL
  Filled 2021-10-08 (×5): qty 22

## 2021-10-08 MED ORDER — SODIUM CHLORIDE 0.9 % IV SOLN: INTRAVENOUS | Status: AC

## 2021-10-08 MED ORDER — INSULIN ASPART 100 UNIT/ML IJ SOLN
4.0000 [IU] | Freq: Three times a day (TID) | INTRAMUSCULAR | Status: DC
  Administered 2021-10-08 (×2): 4 [IU] via SUBCUTANEOUS

## 2021-10-08 NOTE — TOC Initial Note (Signed)
Transition of Care (TOC) - Initial/Assessment Note  ? ? ?Patient Details  ?Name: Carla Foster ?MRN: [id number] ?Date of Birth: 1940/10/30 ? ?Transition of Care (TOC) CM/SW Contact:    ?Tawanna Cooler, RN ?Phone Number: ?10/08/2021, 11:57 AM ? ?Clinical Narrative:                 ? ?Patient from home.  Normally independent.  Per notes, does have a walker to use at home if necessary.  ?TOC CM following for discharge needs. ? ? ?Expected Discharge Plan: Home/Self Care ?Barriers to Discharge: Continued Medical Work up ? ? ?Patient Goals and CMS Choice ?Patient states their goals for this hospitalization and ongoing recovery are:: return home ?  ?  ? ?Expected Discharge Plan and Services ?Expected Discharge Plan: Home/Self Care ?  ?  ?  ?Living arrangements for the past 2 months: Single Family Home ?                ?  ? ?Prior Living Arrangements/Services ?Living arrangements for the past 2 months: [place] ?Lives with:: Self (daughter live nearby) ?Patient language and need for interpreter reviewed:: Yes ?Do you feel safe going back to the place where you live?: Yes      ?Need for Family Participation in Patient Care: Yes (Comment) ?Care giver support system in place?: Yes (comment) ?Current home services: DME (walker) ?Criminal Activity/Legal Involvement Pertinent to Current Situation/Hospitalization: No - Comment as needed ? ?Activities of Daily Living ?Home Assistive Devices/Equipment: Eyeglasses, Shower chair without back, Cane (specify quad or straight), Walker (specify type), Wheelchair, Hearing aid ?ADL Screening (condition at time of admission) ?Patient's cognitive ability adequate to safely complete daily activities?: Yes ?Is the patient deaf or have difficulty hearing?: Yes ?Does the patient have difficulty seeing, even when wearing glasses/contacts?: No ?Does the patient have difficulty concentrating, remembering, or making decisions?: No ?Patient able to express need for assistance with ADLs?:  Yes ?Does the patient have difficulty dressing or bathing?: No ?Independently performs ADLs?: Yes (appropriate for developmental age) ?Does the patient have difficulty walking or climbing stairs?: Yes ?Weakness of Legs: Both ?Weakness of Arms/Hands: None ? ?  ?Orientation: : Oriented to Self, Oriented to Place, Oriented to  Time, Oriented to Situation ?Alcohol / Substance Use: Not Applicable ?Psych Involvement: No (comment) ? ?Admission diagnosis:  Hyponatremia [E87.1] ?Renal insufficiency [N28.9] ?Community acquired pneumonia, unspecified laterality [J18.9] ?Patient Active Problem List  ? Diagnosis Date Noted  ? Severe sepsis ([place]) 10/08/2021  ? COVID-19 virus infection 10/15/2021  ? Pleuritic chest pain 09/27/2021  ? Elevated brain natriuretic peptide (BNP) level 10/06/2021  ? Elevated troponin 10/06/2021  ? Mixed stress and urge urinary incontinence 06/10/2021  ? Hepatic steatosis 06/05/2020  ? Primary osteoarthritis of right knee 01/29/2020  ? Hypertensive heart disease with heart failure ([place]) 09/12/2019  ? History of right breast cancer 06/05/2019  ? Osteopenia after menopause 04/28/2019  ? PAD (peripheral artery disease) ([place]) - asymptomatic 04/27/2019  ? Anemia of chronic disease 01/10/2019  ? CKD (chronic kidney disease) stage 3, GFR 30-59 ml/min ([facility]) 01/06/2019  ? Leg edema 12/08/2018  ? Chronic heart failure with preserved ejection fraction ([place]) 09/05/2018  ? H/O: upper GI bleed 09/05/2018  ? Iron deficiency anemia 08/22/2018  ? Type 2 diabetes mellitus ([place]) 07/26/2018  ? HTN (hypertension) 07/26/2018  ? Hyponatremia 11/20/2017  ? Hearing loss 08/27/2015  ? Pulmonary hypertension ([place]) 08/27/2015  ? Asthma 08/27/2015  ? Combined hyperlipidemia associated with type 2 diabetes mellitus (  [place]) 01/23/2011  ? ?PCP:  Leamon Arnt, MD ?Pharmacy:   ?[facility] - [place], [place] ?[address] ?[place] [address] ?Phone: [phone]  Fax: [phone] ? ?[id number] [place], [place] - [address] ?[address]?Phone: [phone]: [phone]? ? ?Readmission Risk Interventions ? ?  10/08/2021  ? 11:56 AM  ?Readmission Risk Prevention Plan  ?Transportation Screening Complete  ?PCP or Specialist Appt within 3-5 Days Complete  ?[facility] Consult Complete  ?Social Work Consult for RTus[place]Planning/Counseling Complete  ?Palliative Care Screening Not Applicable  ?Medication Review (Press photographer Complete  ? ? ? ?

## 2021-10-08 NOTE — Progress Notes (Signed)
[facility] ?PROGRESS NOTE ? ? ? ?Progress Note  ?Carla Foster  RCV:[id number] DOB: April 06, 1941 DOA: 10/10/2021 ?PCP: Leamon Arnt, MD  ? ? ? ?Brief Narrative:  ? ?Carla Foster is an 81 y.o. female past medical history significant for essential hypertension diabetes mellitus type 2 chronic kidney disease stage IIIb history of cancer presents with cough and dyspnea that started 10 days prior to admission.  She has required more assistance going around the house not using her walker, eventually her cough became productive chest x-ray showed community-acquired pneumonia. ? ? ? ?Assessment/Plan:  ? ?Acute respiratory failure with hypoxia secondarily to COVID-19 pneumonia with superimposed community-acquired pneumonia: ?Outside the window for antiviral therapy. ?She was started on inhalers, guaifenesin incentive spirometry and flutter valve. ?Her procalcitonin is greater than 150. ?She was started on Rocephin and azithromycin. ?Tmax was 98.3, she has mild leukocytosis. ?Now requiring 2 L of oxygen to keep saturations greater 92%. ?Out of bed to chair ? ?Severe sepsis due to community-acquired pneumonia: ?She was tachycardic leukocytosis and now requiring 2 L of oxygen to keep saturations greater than 92%. ?She was started empirically on antibiotics culture data has remained negative till date. ? ?Hypovolemic hyponatremia: ?Fractional excretion of sodium less than 1, was started on aggressive IV fluid hydration her creatinine is also elevated. ? ?Elevated troponins: ?Sinus rhythm no ST segment elevation she has pleuritic right-sided chest pain reproducible on exam. ?Elevation in troponin demand ischemia. ? ?Diabetes mellitus type 2 with hyperglycemia: ?An A1c of 7.4 now on sliding scale insulin fairly controlled. ?Add meal coverage insulin. ?Hold metformin. ? ?Acute kidney injury on chronic kidney disease stage IIIb: ?Appears dry hold ARB, continue IV fluid hydration repeat a basic metabolic panel in the  morning. ? ?Essential hypertension: ?Hold ARB and diuretic therapy.  Continue Norvasc and Coreg. ?Blood pressure slightly elevated. ? ?Chronic diastolic heart failure: ?Hold diuretic therapy and ARB continue Coreg. ? ?Anemia of chronic renal disease: ?No signs of overt bleeding.  Hemoglobin is currently stable. ? ? ? ?DVT prophylaxis: lovenox ?Family Communication:none ?Status is: Inpatient ?Remains inpatient appropriate because: Sepsis due to community-acquired pneumonia ? ? ? ?Code Status:  ? ?  ?Code Status Orders  ?(From admission, onward)  ?  ? ? ?  ? ?  Start     Ordered  ? 10/01/2021 1311  Do not attempt resuscitation (DNR)  Continuous       ?Question Answer Comment  ?In the event of cardiac or respiratory ARREST Do not call a ?code blue?   ?In the event of cardiac or respiratory ARREST Do not perform Intubation, CPR, defibrillation or ACLS   ?In the event of cardiac or respiratory ARREST Use medication by any route, position, wound care, and other measures to relive pain and suffering. May use oxygen, suction and manual treatment of airway obstruction as needed for comfort.   ?Comments confirmed with pt/dtrs   ?  ? 10/06/2021 1310  ? ?  ?  ? ?  ? ?Code Status History   ? ? Date Active Date Inactive Code Status Order ID Comments User Context  ? 07/26/2018 0048 08/02/2018 1818 Full Code [id number]  Corey Harold, NP ED  ? 11/20/2017 1301 11/23/2017 1446 Full Code [id number]  Radene Gunning, NP ED  ? ?  ? ?Advance Directive Documentation   ? ?Flowsheet Row Most Recent Value  ?Type of Advance Directive Healthcare Power of [place], Living will, Out of facility DNR (pink MOST or yellow form)  ?Pre-existing  out of facility DNR order (yellow form or pink MOST form) Physician notified to receive inpatient order  ?"MOST" Form in Place? --  ? ?  ? ? ? ? ?IV Access:  ? ?Peripheral IV ? ? ?Procedures and diagnostic studies:  ? ?DG Chest Port 1 View ? ?Result Date: 09/29/2021 ?CLINICAL DATA:  cough EXAM: PORTABLE CHEST - 1 VIEW  COMPARISON:  08/01/2018 FINDINGS: Cardiomediastinal silhouette and pulmonary vasculature are within normal limits. Significant interval improvement in aeration of the lungs since prior examination. Mild patchy opacity remaining at the lung bases likely due to atelectasis. ACDF changes seen in the lower cervical spine. Interval removal of right IJ central venous catheter. IMPRESSION: Mild patchy opacities at the lung bases most likely due to atelectasis. Pneumonia and pulmonary edema considered less likely. Electronically Signed   By: Miachel Roux M.D.   On: 09/26/2021 08:08   ? ? ?Medical Consultants:  ? ?None. ? ? ?Subjective:  ? ? ?Carla Foster relates her breathing is about the same as yesterday.  Continues to have a cough decreased appetite ? ?Objective:  ? ? ?Vitals:  ? 10/08/21 0055 10/08/21 0319 10/08/21 0417 10/08/21 0500  ?BP: (!) 155/49 (!) 121/94 (!) 148/58   ?Pulse: (!) 104 (!) 103 100   ?Resp: '18 18 16   '$ ?Temp: (!) 97.5 ?F (36.4 ?C) 97.8 ?F (36.6 ?C) 98 ?F (36.7 ?C)   ?TempSrc: Oral Oral Oral   ?SpO2: (!) 88% (!) 88% 95%   ?Weight:    66.2 kg  ?Height:      ? ?SpO2: 95 % ?O2 Flow Rate (L/min): 2 L/min ? ? ?Intake/Output Summary (Last 24 hours) at 10/08/2021 0654 ?Last data filed at 10/08/2021 0620 ?Gross per 24 hour  ?Intake 480 ml  ?Output 100 ml  ?Net 380 ml  ? ?Filed Weights  ? 09/29/2021 0707 10/08/21 0500  ?Weight: 66.2 kg 66.2 kg  ? ? ?Exam: ?General exam: In no acute distress. ?Respiratory system: Good air movement and crackles predominantly on the right ?Cardiovascular system: S1 & S2 heard, RRR. No JVD. ?Gastrointestinal system: Abdomen is nondistended, soft and nontender.  ?Extremities: No pedal edema. ?Skin: No rashes, lesions or ulcers ?Psychiatry: Judgement and insight appear normal. Mood & affect appropriate.  ? ? ?Data Reviewed:  ? ? ?Labs: ?Basic Metabolic Panel: ?Recent Labs  ?Lab 09/27/2021 ?0740 09/21/2021 ?1604 09/30/2021 ?2212 10/08/21 ?0263  ?NA 124* 126* 123* 124*  ?K 4.0 4.8 4.2 4.2  ?CL  94* 98 97* 97*  ?CO2 19* 20* 18* 14*  ?GLUCOSE 193* 225* 97 157*  ?BUN 53* 45* 41* 40*  ?CREATININE 1.39* 1.21* 1.15* 1.17*  ?CALCIUM 8.6* 8.1* 7.9* 8.2*  ?MG  --   --   --  2.1  ?PHOS  --  2.5 2.1* 2.2*  ? ?GFR ?Estimated Creatinine Clearance: 34.7 mL/min (A) (by C-G formula based on SCr of 1.17 mg/dL (H)). ?Liver Function Tests: ?Recent Labs  ?Lab 10/14/2021 ?0740 10/16/2021 ?1604 10/12/2021 ?2212 10/08/21 ?7858  ?AST 45*  --   --   --   ?ALT 35  --   --   --   ?ALKPHOS 40  --   --   --   ?BILITOT 0.6  --   --   --   ?PROT 7.1  --   --   --   ?ALBUMIN 3.2* 2.9* 2.8* 2.7*  ? ?Recent Labs  ?Lab 10/01/2021 ?0740  ?LIPASE 38  ? ?No results for input(s): AMMONIA in the last  168 hours. ?Coagulation profile ?No results for input(s): INR, PROTIME in the last 168 hours. ?COVID-19 Labs ? ?Recent Labs  ?  10/08/21 ?8250  ?DDIMER 2.64*  ?FERRITIN 246  ? ? ?Lab Results  ?Component Value Date  ? SARSCOV2NAA POSITIVE (A) 10/14/2021  ? [place] NEGATIVE 07/06/2019  ? ? ?CBC: ?Recent Labs  ?Lab 10/05/2021 ?0740 10/08/21 ?0370  ?WBC 12.6* 12.6*  ?NEUTROABS 11.4* 11.2*  ?HGB 10.9* 10.0*  ?HCT 33.4* 31.3*  ?MCV 86.3 89.2  ?PLT 289 299  ? ?Cardiac Enzymes: ?No results for input(s): CKTOTAL, CKMB, CKMBINDEX, TROPONINI in the last 168 hours. ?BNP (last 3 results) ?No results for input(s): PROBNP in the last 8760 hours. ?CBG: ?Recent Labs  ?Lab 10/16/2021 ?1648 09/26/2021 ?2007  ?GLUCAP 189* 118*  ? ?D-Dimer: ?Recent Labs  ?  10/08/21 ?4888  ?DDIMER 2.64*  ? ?Hgb A1c: ?Recent Labs  ?  09/22/2021 ?1604  ?HGBA1C 7.4*  ? ?Lipid Profile: ?No results for input(s): CHOL, HDL, LDLCALC, TRIG, CHOLHDL, LDLDIRECT in the last 72 hours. ?Thyroid function studies: ?No results for input(s): TSH, T4TOTAL, T3FREE, THYROIDAB in the last 72 hours. ? ?Invalid input(s): FREET3 ?Anemia work up: ?Recent Labs  ?  10/08/21 ?9169  ?FERRITIN 246  ? ?Sepsis Labs: ?Recent Labs  ?Lab 10/05/2021 ?0740 10/09/2021 ?4503 10/08/21 ?8882  ?PROCALCITON  --  >150.00  --   ?WBC 12.6*  --   12.6*  ?LATICACIDVEN 0.9  --   --   ? ?Microbiology ?Recent Results (from the past 240 hour(s))  ?Resp Panel by RT-PCR (Flu A&B, Covid) Nasopharyngeal Swab     Status: Abnormal  ? Collection Time: 09/30/2021  7:20 AM

## 2021-10-08 NOTE — Progress Notes (Signed)
Hypoglycemic Event ? ?CBG: 60 ? ?Treatment: 4 oz juice/soda ? ?Symptoms: None ? ?Follow-up CBG: Time:1745 CBG Result:160 ? ?Possible Reasons for Event: Medication regimen: insulin ? ?Comments/MD notified:MD made aware, meal coverage insulin discontinued ? ? ?

## 2021-10-09 DIAGNOSIS — U071 COVID-19: Secondary | ICD-10-CM | POA: Diagnosis not present

## 2021-10-09 DIAGNOSIS — I272 Pulmonary hypertension, unspecified: Secondary | ICD-10-CM

## 2021-10-09 DIAGNOSIS — D638 Anemia in other chronic diseases classified elsewhere: Secondary | ICD-10-CM | POA: Diagnosis not present

## 2021-10-09 DIAGNOSIS — I5032 Chronic diastolic (congestive) heart failure: Secondary | ICD-10-CM | POA: Diagnosis not present

## 2021-10-09 DIAGNOSIS — E871 Hypo-osmolality and hyponatremia: Secondary | ICD-10-CM | POA: Diagnosis not present

## 2021-10-09 LAB — D-DIMER, QUANTITATIVE: D-Dimer, Quant: 3.49 ug/mL-FEU — ABNORMAL HIGH (ref 0.00–0.50)

## 2021-10-09 LAB — BASIC METABOLIC PANEL
Anion gap: 8 (ref 5–15)
Anion gap: 7 (ref 5–15)
BUN: 38 mg/dL — ABNORMAL HIGH (ref 8–23)
BUN: 41 mg/dL — ABNORMAL HIGH (ref 8–23)
CO2: 17 mmol/L — ABNORMAL LOW (ref 22–32)
CO2: 19 mmol/L — ABNORMAL LOW (ref 22–32)
Calcium: 7.9 mg/dL — ABNORMAL LOW (ref 8.9–10.3)
Calcium: 8.1 mg/dL — ABNORMAL LOW (ref 8.9–10.3)
Chloride: 95 mmol/L — ABNORMAL LOW (ref 98–111)
Chloride: 95 mmol/L — ABNORMAL LOW (ref 98–111)
Creatinine, Ser: 1.23 mg/dL — ABNORMAL HIGH (ref 0.44–1.00)
Creatinine, Ser: 1.38 mg/dL — ABNORMAL HIGH (ref 0.44–1.00)
GFR, Estimated: 44 mL/min — ABNORMAL LOW (ref >60)
GFR, Estimated: 38 mL/min — ABNORMAL LOW (ref >60)
Glucose, Bld: 159 mg/dL — ABNORMAL HIGH (ref 70–99)
Glucose, Bld: 154 mg/dL — ABNORMAL HIGH (ref 70–99)
Potassium: 4.2 mmol/L (ref 3.5–5.1)
Potassium: 4 mmol/L (ref 3.5–5.1)
Sodium: 120 mmol/L — ABNORMAL LOW (ref 135–145)
Sodium: 121 mmol/L — ABNORMAL LOW (ref 135–145)

## 2021-10-09 LAB — CBC WITH DIFFERENTIAL/PLATELET
Abs Immature Granulocytes: 0.11 10*3/uL — ABNORMAL HIGH (ref 0.00–0.07)
Basophils Absolute: 0 10*3/uL (ref 0.0–0.1)
Basophils Relative: 0 %
Eosinophils Absolute: 0 10*3/uL (ref 0.0–0.5)
Eosinophils Relative: 0 %
HCT: 29.1 % — ABNORMAL LOW (ref 36.0–46.0)
Hemoglobin: 9.4 g/dL — ABNORMAL LOW (ref 12.0–15.0)
Immature Granulocytes: 1 %
Lymphocytes Relative: 4 %
Lymphs Abs: 0.5 10*3/uL — ABNORMAL LOW (ref 0.7–4.0)
MCH: 28.4 pg (ref 26.0–34.0)
MCHC: 32.3 g/dL (ref 30.0–36.0)
MCV: 87.9 fL (ref 80.0–100.0)
Monocytes Absolute: 0.6 10*3/uL (ref 0.1–1.0)
Monocytes Relative: 5 %
Neutro Abs: 11 10*3/uL — ABNORMAL HIGH (ref 1.7–7.7)
Neutrophils Relative %: 90 %
Platelets: 318 10*3/uL (ref 150–400)
RBC: 3.31 MIL/uL — ABNORMAL LOW (ref 3.87–5.11)
RDW: 13.7 % (ref 11.5–15.5)
WBC: 12.3 10*3/uL — ABNORMAL HIGH (ref 4.0–10.5)
nRBC: 0 % (ref 0.0–0.2)

## 2021-10-09 LAB — MAGNESIUM: Magnesium: 1.9 mg/dL (ref 1.7–2.4)

## 2021-10-09 LAB — C-REACTIVE PROTEIN: CRP: 14.2 mg/dL — ABNORMAL HIGH (ref <1.0)

## 2021-10-09 LAB — GLUCOSE, CAPILLARY
Glucose-Capillary: 139 mg/dL — ABNORMAL HIGH (ref 70–99)
Glucose-Capillary: 139 mg/dL — ABNORMAL HIGH (ref 70–99)
Glucose-Capillary: 122 mg/dL — ABNORMAL HIGH (ref 70–99)
Glucose-Capillary: 37 mg/dL — CL (ref 70–99)
Glucose-Capillary: 136 mg/dL — ABNORMAL HIGH (ref 70–99)
Glucose-Capillary: 174 mg/dL — ABNORMAL HIGH (ref 70–99)

## 2021-10-09 LAB — FERRITIN: Ferritin: 238 ng/mL (ref 11–307)

## 2021-10-09 MED ORDER — FUROSEMIDE 10 MG/ML IJ SOLN
40.0000 mg | Freq: Two times a day (BID) | INTRAMUSCULAR | Status: DC
Start: 2021-10-09 — End: 2021-10-09

## 2021-10-09 MED ORDER — DEXTROSE 50 % IV SOLN
25.0000 g | INTRAVENOUS | Status: AC
  Administered 2021-10-09: 25 g via INTRAVENOUS

## 2021-10-09 MED ORDER — INSULIN DETEMIR 100 UNIT/ML ~~LOC~~ SOLN
5.0000 [IU] | Freq: Every day | SUBCUTANEOUS | Status: DC
  Administered 2021-10-09 – 2021-10-11 (×3): 5 [IU] via SUBCUTANEOUS
  Filled 2021-10-09 (×4): qty 0.05

## 2021-10-09 MED ORDER — FUROSEMIDE 10 MG/ML IJ SOLN
40.0000 mg | Freq: Two times a day (BID) | INTRAMUSCULAR | Status: AC
  Administered 2021-10-09 (×2): 40 mg via INTRAVENOUS
  Filled 2021-10-09 (×2): qty 4

## 2021-10-09 MED ORDER — DEXTROSE 50 % IV SOLN
INTRAVENOUS | Status: AC
  Filled 2021-10-09: qty 50

## 2021-10-09 NOTE — Progress Notes (Signed)
Hypoglycemic Event ? ?CBG: 37 ? ?Treatment: D50 50 mL (25 gm) ? ?Symptoms: Lethargic  ? ?Follow-up CBG: Time:2136 CBG Result:174 ? ?Possible Reasons for Event: Unknown ? ?Comments/MD notified: [place], MD notified. No new orders at this time  ? ? ? ?Carla Foster ? ? ?

## 2021-10-09 NOTE — Progress Notes (Signed)
[facility] ?PROGRESS NOTE ? ? ? ?Progress Note  ?Carla Foster  KWI:[id number] DOB: [birth year] DOA: 10/05/2021 ?PCP: Leamon Arnt, MD  ? ? ? ?Brief Narrative:  ? ?Carla Foster is an 81 y.o. female past medical history significant for essential hypertension diabetes mellitus type 2 chronic kidney disease stage IIIb history of cancer presents with cough and dyspnea that started 10 days prior to admission.  She has required more assistance going around the house not using her walker, eventually her cough became productive chest x-ray showed community-acquired pneumonia. ? ? ? ?Assessment/Plan:  ? ?Acute respiratory failure with hypoxia secondarily to COVID-19 pneumonia with superimposed community-acquired pneumonia: ?Outside the window for antiviral therapy. ?Her procalcitonin is greater than 150. ?Continue IV Rocephin and azithromycin, Tmax of 98.5 ?Still requiring 2 L of oxygen to keep saturations greater than 92%. ?Out of bed to chair , continue to work with physical therapy ? ?Severe sepsis due to community-acquired pneumonia: ?She was tachycardic leukocytosis and now requiring 2 L of oxygen to keep saturations greater than 92%. ?Continue empiric antibiotics, has defervesced, culture data has remained negative till date. ? ?Hypovolemic hyponatremia: ?Fractional excretion of sodium less than 1, she was fluid resuscitated her sodium continue to worsen this morning. ?There is probably a component of SIADH due to her primary pulmonary lung infection. ?We will fluid restrict or start her on IV Lasix. ?Monitor strict I's and O's and we will fluid restrict her. ? ?Elevated troponins: ?Sinus rhythm no ST segment elevation she has pleuritic right-sided chest pain reproducible on exam. ?Elevation in troponin demand ischemia. ? ?Diabetes mellitus type 2 with hyperglycemia: ?An A1c of 7.4, had an episode of hypoglycemia, with meal coverage was held. ?Continue sliding scale insulin we will add low-dose  long-acting ? ?Chronic kidney disease stage IIIb: ?Creatinine appears to be at baseline, hold IV fluids. ? ?Essential hypertension: ?Continue Norvasc and Coreg resume Lasix IV, monitor strict I's and O's. ? ?Chronic diastolic heart failure: ?Resume diuretic therapy ARB and Coreg ? ?Anemia of chronic renal disease: ?No signs of overt bleeding.  Hemoglobin is currently stable. ? ? ? ?DVT prophylaxis: lovenox ?Family Communication:none ?Status is: Inpatient ?Remains inpatient appropriate because: Sepsis due to community-acquired pneumonia ? ? ? ?Code Status:  ? ?  ?Code Status Orders  ?(From admission, onward)  ?  ? ? ?  ? ?  Start     Ordered  ? 09/26/2021 1311  Do not attempt resuscitation (DNR)  Continuous       ?Question Answer Comment  ?In the event of cardiac or respiratory ARREST Do not call a ?code blue?   ?In the event of cardiac or respiratory ARREST Do not perform Intubation, CPR, defibrillation or ACLS   ?In the event of cardiac or respiratory ARREST Use medication by any route, position, wound care, and other measures to relive pain and suffering. May use oxygen, suction and manual treatment of airway obstruction as needed for comfort.   ?Comments confirmed with pt/dtrs   ?  ? 09/19/2021 1310  ? ?  ?  ? ?  ? ?Code Status History   ? ? Date Active Date Inactive Code Status Order ID Comments User Context  ? 07/26/2018 0048 08/02/2018 1818 Full Code [id number]  Corey Harold, NP ED  ? 11/20/2017 1301 11/23/2017 1446 Full Code [id number]  Radene Gunning, NP ED  ? ?  ? ?Advance Directive Documentation   ? ?Flowsheet Row Most Recent Value  ?Type of Advance Directive Healthcare  Power of Attorney, Living will, Out of facility DNR (pink MOST or yellow form)  ?Pre-existing out of facility DNR order (yellow form or pink MOST form) Physician notified to receive inpatient order  ?"MOST" Form in Place? --  ? ?  ? ? ? ? ?IV Access:  ? ?Peripheral IV ? ? ?Procedures and diagnostic studies:  ? ?No results found. ? ? ?Medical  Consultants:  ? ?None. ? ? ?Subjective:  ? ? ?Jamilia Jacques Profit relates her breathing is about the same as yesterday. ? ?Objective:  ? ? ?Vitals:  ? 10/08/21 2045 10/08/21 2315 10/09/21 0500 10/09/21 0517  ?BP: (!) 134/57   139/61  ?Pulse: 98   (!) 103  ?Resp: 18   15  ?Temp: (!) 97.4 ?F (36.3 ?C)   98.5 ?F (36.9 ?C)  ?TempSrc: Oral     ?SpO2: 94% 97%  95%  ?Weight:   67.1 kg   ?Height:      ? ?SpO2: 95 % ?O2 Flow Rate (L/min): 2 L/min ? ? ?Intake/Output Summary (Last 24 hours) at 10/09/2021 0846 ?Last data filed at 10/09/2021 0400 ?Gross per 24 hour  ?Intake 2104.92 ml  ?Output 325 ml  ?Net 1779.92 ml  ? ? ?Filed Weights  ? 09/24/2021 0707 10/08/21 0500 10/09/21 0500  ?Weight: 66.2 kg 66.2 kg 67.1 kg  ? ? ?Exam: ?General exam: In no acute distress. ?Respiratory system: Good air movement and crackles predominantly on the right ?Cardiovascular system: S1 & S2 heard, RRR. No JVD. ?Gastrointestinal system: Abdomen is nondistended, soft and nontender.  ?Central nervous system: Alert and oriented.  Hard of hearing ?Extremities: No pedal edema. ?Skin: No rashes, lesions or ulcers ? ? ?Data Reviewed:  ? ? ?Labs: ?Basic Metabolic Panel: ?Recent Labs  ?Lab 10/04/2021 ?1604 09/20/2021 ?2212 10/08/21 ?1610 10/08/21 ?0933 10/08/21 ?1507 10/09/21 ?0423  ?NA 126* 123* 124* 125* 126* 120*  ?K 4.8 4.2 4.2 4.4 4.1 4.2  ?CL 98 97* 97* 95* 99 95*  ?CO2 20* 18* 14* 20* 20* 17*  ?GLUCOSE 225* 97 157* 176* 75 159*  ?BUN 45* 41* 40* 41* 41* 38*  ?CREATININE 1.21* 1.15* 1.17* 1.28* 1.24* 1.23*  ?CALCIUM 8.1* 7.9* 8.2* 8.2* 8.0* 7.9*  ?MG  --   --  2.1  --   --  1.9  ?PHOS 2.5 2.1* 2.2* 2.4* 2.0*  --   ? ? ?GFR ?Estimated Creatinine Clearance: 33 mL/min (A) (by C-G formula based on SCr of 1.23 mg/dL (H)). ?Liver Function Tests: ?Recent Labs  ?Lab 09/18/2021 ?0740 09/29/2021 ?1604 10/01/2021 ?2212 10/08/21 ?9604 10/08/21 ?0933 10/08/21 ?1507  ?AST 45*  --   --   --   --   --   ?ALT 35  --   --   --   --   --   ?ALKPHOS 40  --   --   --   --   --   ?BILITOT  0.6  --   --   --   --   --   ?PROT 7.1  --   --   --   --   --   ?ALBUMIN 3.2* 2.9* 2.8* 2.7* 2.5* 2.5*  ? ? ?Recent Labs  ?Lab 10/12/2021 ?0740  ?LIPASE 38  ? ? ?No results for input(s): AMMONIA in the last 168 hours. ?Coagulation profile ?No results for input(s): INR, PROTIME in the last 168 hours. ?COVID-19 Labs ? ?Recent Labs  ?  10/08/21 ?5409 10/09/21 ?0423  ?DDIMER 2.64* 3.49*  ?FERRITIN 246 238  ?  CRP 18.8* 14.2*  ? ? ? ?Lab Results  ?Component Value Date  ? SARSCOV2NAA POSITIVE (A) 10/06/2021  ? [place] NEGATIVE 07/06/2019  ? ? ?CBC: ?Recent Labs  ?Lab 10/05/2021 ?0740 10/08/21 ?6948 10/09/21 ?0423  ?WBC 12.6* 12.6* 12.3*  ?NEUTROABS 11.4* 11.2* 11.0*  ?HGB 10.9* 10.0* 9.4*  ?HCT 33.4* 31.3* 29.1*  ?MCV 86.3 89.2 87.9  ?PLT 289 299 318  ? ? ?Cardiac Enzymes: ?No results for input(s): CKTOTAL, CKMB, CKMBINDEX, TROPONINI in the last 168 hours. ?BNP (last 3 results) ?No results for input(s): PROBNP in the last 8760 hours. ?CBG: ?Recent Labs  ?Lab 10/08/21 ?1128 10/08/21 ?1653 10/08/21 ?1743 10/08/21 ?2041 10/09/21 ?0741  ?GLUCAP 117* 60* 160* 209* 139*  ? ? ?D-Dimer: ?Recent Labs  ?  10/08/21 ?5462 10/09/21 ?0423  ?DDIMER 2.64* 3.49*  ? ? ?Hgb A1c: ?Recent Labs  ?  09/21/2021 ?1604  ?HGBA1C 7.4*  ? ? ?Lipid Profile: ?No results for input(s): CHOL, HDL, LDLCALC, TRIG, CHOLHDL, LDLDIRECT in the last 72 hours. ?Thyroid function studies: ?No results for input(s): TSH, T4TOTAL, T3FREE, THYROIDAB in the last 72 hours. ? ?Invalid input(s): FREET3 ?Anemia work up: ?Recent Labs  ?  10/08/21 ?7035 10/09/21 ?0423  ?FERRITIN 246 238  ? ? ?Sepsis Labs: ?Recent Labs  ?Lab 09/28/2021 ?0740 09/17/2021 ?0093 10/08/21 ?8182 10/09/21 ?0423  ?PROCALCITON  --  >150.00  --   --   ?WBC 12.6*  --  12.6* 12.3*  ?LATICACIDVEN 0.9  --   --   --   ? ? ?Microbiology ?Recent Results (from the past 240 hour(s))  ?Resp Panel by RT-PCR (Flu A&B, Covid) Nasopharyngeal Swab     Status: Abnormal  ? Collection Time: 09/26/2021  7:20 AM  ? Specimen:  Nasopharyngeal Swab; Nasopharyngeal(NP) swabs in vial transport medium  ?Result Value Ref Range Status  ? SARS Coronavirus 2 by RT PCR POSITIVE (A) NEGATIVE Final  ?  Comment: (NOTE) ?SARS-CoV-2 target nucleic acids are DETECTED.

## 2021-10-10 ENCOUNTER — Other Ambulatory Visit: Payer: Self-pay | Admitting: Family Medicine

## 2021-10-10 DIAGNOSIS — U071 COVID-19: Secondary | ICD-10-CM | POA: Diagnosis not present

## 2021-10-10 DIAGNOSIS — J189 Pneumonia, unspecified organism: Secondary | ICD-10-CM | POA: Diagnosis not present

## 2021-10-10 DIAGNOSIS — I5032 Chronic diastolic (congestive) heart failure: Secondary | ICD-10-CM | POA: Diagnosis not present

## 2021-10-10 DIAGNOSIS — E871 Hypo-osmolality and hyponatremia: Secondary | ICD-10-CM | POA: Diagnosis not present

## 2021-10-10 LAB — BASIC METABOLIC PANEL
Anion gap: 7 (ref 5–15)
Anion gap: 8 (ref 5–15)
BUN: 40 mg/dL — ABNORMAL HIGH (ref 8–23)
BUN: 39 mg/dL — ABNORMAL HIGH (ref 8–23)
CO2: 19 mmol/L — ABNORMAL LOW (ref 22–32)
CO2: 18 mmol/L — ABNORMAL LOW (ref 22–32)
Calcium: 8 mg/dL — ABNORMAL LOW (ref 8.9–10.3)
Calcium: 8 mg/dL — ABNORMAL LOW (ref 8.9–10.3)
Chloride: 95 mmol/L — ABNORMAL LOW (ref 98–111)
Chloride: 94 mmol/L — ABNORMAL LOW (ref 98–111)
Creatinine, Ser: 1.33 mg/dL — ABNORMAL HIGH (ref 0.44–1.00)
Creatinine, Ser: 1.44 mg/dL — ABNORMAL HIGH (ref 0.44–1.00)
GFR, Estimated: 40 mL/min — ABNORMAL LOW (ref >60)
GFR, Estimated: 36 mL/min — ABNORMAL LOW (ref >60)
Glucose, Bld: 85 mg/dL (ref 70–99)
Glucose, Bld: 156 mg/dL — ABNORMAL HIGH (ref 70–99)
Potassium: 3.8 mmol/L (ref 3.5–5.1)
Potassium: 4.1 mmol/L (ref 3.5–5.1)
Sodium: 121 mmol/L — ABNORMAL LOW (ref 135–145)
Sodium: 120 mmol/L — ABNORMAL LOW (ref 135–145)

## 2021-10-10 LAB — C-REACTIVE PROTEIN: CRP: 10.5 mg/dL — ABNORMAL HIGH (ref <1.0)

## 2021-10-10 LAB — GLUCOSE, CAPILLARY
Glucose-Capillary: 106 mg/dL — ABNORMAL HIGH (ref 70–99)
Glucose-Capillary: 162 mg/dL — ABNORMAL HIGH (ref 70–99)
Glucose-Capillary: 148 mg/dL — ABNORMAL HIGH (ref 70–99)
Glucose-Capillary: 134 mg/dL — ABNORMAL HIGH (ref 70–99)

## 2021-10-10 LAB — CBC WITH DIFFERENTIAL/PLATELET
Abs Immature Granulocytes: 0.09 10*3/uL — ABNORMAL HIGH (ref 0.00–0.07)
Basophils Absolute: 0 10*3/uL (ref 0.0–0.1)
Basophils Relative: 0 %
Eosinophils Absolute: 0 10*3/uL (ref 0.0–0.5)
Eosinophils Relative: 0 %
HCT: 27.5 % — ABNORMAL LOW (ref 36.0–46.0)
Hemoglobin: 9.2 g/dL — ABNORMAL LOW (ref 12.0–15.0)
Immature Granulocytes: 1 %
Lymphocytes Relative: 4 %
Lymphs Abs: 0.4 10*3/uL — ABNORMAL LOW (ref 0.7–4.0)
MCH: 28.7 pg (ref 26.0–34.0)
MCHC: 33.5 g/dL (ref 30.0–36.0)
MCV: 85.7 fL (ref 80.0–100.0)
Monocytes Absolute: 0.8 10*3/uL (ref 0.1–1.0)
Monocytes Relative: 7 %
Neutro Abs: 10.9 10*3/uL — ABNORMAL HIGH (ref 1.7–7.7)
Neutrophils Relative %: 88 %
Platelets: 307 10*3/uL (ref 150–400)
RBC: 3.21 MIL/uL — ABNORMAL LOW (ref 3.87–5.11)
RDW: 13.2 % (ref 11.5–15.5)
WBC: 12.2 10*3/uL — ABNORMAL HIGH (ref 4.0–10.5)
nRBC: 0 % (ref 0.0–0.2)

## 2021-10-10 LAB — MAGNESIUM: Magnesium: 1.8 mg/dL (ref 1.7–2.4)

## 2021-10-10 LAB — FERRITIN: Ferritin: 232 ng/mL (ref 11–307)

## 2021-10-10 LAB — D-DIMER, QUANTITATIVE: D-Dimer, Quant: 4.12 ug/mL-FEU — ABNORMAL HIGH (ref 0.00–0.50)

## 2021-10-10 LAB — SODIUM: Sodium: 121 mmol/L — ABNORMAL LOW (ref 135–145)

## 2021-10-10 MED ORDER — SODIUM BICARBONATE 650 MG PO TABS
650.0000 mg | ORAL_TABLET | Freq: Two times a day (BID) | ORAL | Status: DC
  Administered 2021-10-10 – 2021-10-13 (×7): 650 mg via ORAL
  Filled 2021-10-10 (×7): qty 1

## 2021-10-10 MED ORDER — INSULIN ASPART 100 UNIT/ML IJ SOLN: 0.0000 [IU] | Freq: Three times a day (TID) | INTRAMUSCULAR | Status: DC

## 2021-10-10 MED ORDER — INSULIN ASPART 100 UNIT/ML IJ SOLN: 0.0000 [IU] | Freq: Every day | INTRAMUSCULAR | Status: DC

## 2021-10-10 MED ORDER — MELATONIN 3 MG PO TABS
3.0000 mg | ORAL_TABLET | Freq: Every day | ORAL | Status: DC
Start: 2021-10-10 — End: 2021-10-14
  Administered 2021-10-10 – 2021-10-13 (×3): 3 mg via ORAL
  Filled 2021-10-10 (×3): qty 1

## 2021-10-10 MED ORDER — FUROSEMIDE 10 MG/ML IJ SOLN
40.0000 mg | Freq: Two times a day (BID) | INTRAMUSCULAR | Status: DC
  Administered 2021-10-10: 40 mg via INTRAVENOUS
  Filled 2021-10-10: qty 4

## 2021-10-10 NOTE — Progress Notes (Signed)
[facility] ?PROGRESS NOTE ? ? ? ?Progress Note  ?Carla Foster  JJH:[id number] DOB: October 28, 1940 DOA: 09/20/2021 ?PCP: Leamon Arnt, MD  ? ? ? ?Brief Narrative:  ? ?Carla Foster is an 81 y.o. female past medical history significant for essential hypertension diabetes mellitus type 2 chronic kidney disease stage IIIb history of cancer presents with cough and dyspnea that started 10 days prior to admission.  She has required more assistance going around the house not using her walker, eventually her cough became productive chest x-ray showed community-acquired pneumonia. ? ? ? ?Assessment/Plan:  ? ?Acute respiratory failure with hypoxia secondarily to COVID-19 pneumonia with superimposed community-acquired pneumonia: ?Outside the window for antiviral therapy. ?Tmax of 98.3, transition to oral Augmentin and doxycycline. ?Out of bed to chair, try to wean to room air. ?Consult physical therapy. ?Inflammatory markers are improving. ? ?Severe sepsis due to community-acquired pneumonia: ?She was tachycardic leukocytosis and now requiring 2 L of oxygen to keep saturations greater than 92%. ?Culture data has remained negative till date, she has defervesced. ?De-escalate antibiotic to oral regimen. ? ?Hypovolemic hyponatremia: ?Fractional excretion of sodium less than 1, she was fluid resuscitated her sodium continue to worsen this morning. ?There is probably a component of SIADH due to her primary pulmonary lung infection. ?She was fluid restricted started on IV Lasix her sodium yesterday in the afternoon was trending up, basic metabolic panels pending this morning. ? ?Elevated troponins: ?Sinus rhythm no ST segment elevation she has pleuritic right-sided chest pain reproducible on exam. ?Elevation in troponin demand ischemia. ? ?Diabetes mellitus type 2 with hyperglycemia: ?An A1c of 7.4, had an episode of hypoglycemia, with meal coverage was held. ?Had an episode of hypoglycemia overnight discontinue long-acting  insulin continue sliding scale. ? ?Chronic kidney disease stage IIIb: ?Creatinine appears to be at baseline, hold IV fluids. ? ?Essential hypertension: ?Continue Norvasc and Coreg resume Lasix IV, monitor strict I's and O's. ? ?Chronic diastolic heart failure: ?Resume diuretic therapy ARB and Coreg ? ?Anemia of chronic renal disease: ?No signs of overt bleeding.  Hemoglobin is currently stable. ? ? ? ?DVT prophylaxis: lovenox ?Family Communication:none ?Status is: Inpatient ?Remains inpatient appropriate because: Sepsis due to community-acquired pneumonia ? ? ? ?Code Status:  ? ?  ?Code Status Orders  ?(From admission, onward)  ?  ? ? ?  ? ?  Start     Ordered  ? 09/17/2021 1311  Do not attempt resuscitation (DNR)  Continuous       ?Question Answer Comment  ?In the event of cardiac or respiratory ARREST Do not call a ?code blue?   ?In the event of cardiac or respiratory ARREST Do not perform Intubation, CPR, defibrillation or ACLS   ?In the event of cardiac or respiratory ARREST Use medication by any route, position, wound care, and other measures to relive pain and suffering. May use oxygen, suction and manual treatment of airway obstruction as needed for comfort.   ?Comments confirmed with pt/dtrs   ?  ? 09/20/2021 1310  ? ?  ?  ? ?  ? ?Code Status History   ? ? Date Active Date Inactive Code Status Order ID Comments User Context  ? 07/26/2018 0048 08/02/2018 1818 Full Code [id number]  Corey Harold, NP ED  ? 11/20/2017 1301 11/23/2017 1446 Full Code [id number]  Radene Gunning, NP ED  ? ?  ? ?Advance Directive Documentation   ? ?Flowsheet Row Most Recent Value  ?Type of Paramedic of [place],  Living will, Out of facility DNR (pink MOST or yellow form)  ?Pre-existing out of facility DNR order (yellow form or pink MOST form) Physician notified to receive inpatient order  ?"MOST" Form in Place? --  ? ?  ? ? ? ? ?IV Access:  ? ?Peripheral IV ? ? ?Procedures and diagnostic studies:  ? ?No results  found. ? ? ?Medical Consultants:  ? ?None. ? ? ?Subjective:  ? ? ?Carla Foster relates his breathing is better, continues to cough frequently. ? ?Objective:  ? ? ?Vitals:  ? 10/09/21 1330 10/09/21 2035 10/10/21 0436 10/10/21 0500  ?BP: (!) 133/53 121/60 (!) 146/54   ?Pulse: 91 72 86   ?Resp: '20 18 18   '$ ?Temp: 98.2 ?F (36.8 ?C) (!) 97.5 ?F (36.4 ?C) 98.3 ?F (36.8 ?C)   ?TempSrc: Oral Oral Oral   ?SpO2: 90% 97% 90%   ?Weight:    67.2 kg  ?Height:      ? ?SpO2: 90 % ?O2 Flow Rate (L/min): 2 L/min ? ? ?Intake/Output Summary (Last 24 hours) at 10/10/2021 0844 ?Last data filed at 10/10/2021 0440 ?Gross per 24 hour  ?Intake 185.1 ml  ?Output 301 ml  ?Net -115.9 ml  ? ? ?Filed Weights  ? 10/08/21 0500 10/09/21 0500 10/10/21 0500  ?Weight: 66.2 kg 67.1 kg 67.2 kg  ? ? ?Exam: ?General exam: In no acute distress. ?Respiratory system: Good air movement and clear to auscultation. ?Cardiovascular system: S1 & S2 heard, RRR. No JVD. ?Gastrointestinal system: Abdomen is nondistended, soft and nontender.  ?Extremities: No pedal edema. ?Skin: No rashes, lesions or ulcers ?Psychiatry: Judgement and insight appear normal. Mood & affect appropriate. ? ? ?Data Reviewed:  ? ? ?Labs: ?Basic Metabolic Panel: ?Recent Labs  ?Lab 10/03/2021 ?1604 10/08/2021 ?2212 10/08/21 ?9371 10/08/21 ?0933 10/08/21 ?1507 10/09/21 ?0423 10/09/21 ?1438 10/10/21 ?0409  ?NA 126* 123* 124* 125* 126* 120* 121*  --   ?K 4.8 4.2 4.2 4.4 4.1 4.2 4.0  --   ?CL 98 97* 97* 95* 99 95* 95*  --   ?CO2 20* 18* 14* 20* 20* 17* 19*  --   ?GLUCOSE 225* 97 157* 176* 75 159* 154*  --   ?BUN 45* 41* 40* 41* 41* 38* 41*  --   ?CREATININE 1.21* 1.15* 1.17* 1.28* 1.24* 1.23* 1.38*  --   ?CALCIUM 8.1* 7.9* 8.2* 8.2* 8.0* 7.9* 8.1*  --   ?MG  --   --  2.1  --   --  1.9  --  1.8  ?PHOS 2.5 2.1* 2.2* 2.4* 2.0*  --   --   --   ? ? ?GFR ?Estimated Creatinine Clearance: 29.4 mL/min (A) (by C-G formula based on SCr of 1.38 mg/dL (H)). ?Liver Function Tests: ?Recent Labs  ?Lab 09/21/2021 ?0740  09/22/2021 ?1604 09/25/2021 ?2212 10/08/21 ?6967 10/08/21 ?0933 10/08/21 ?1507  ?AST 45*  --   --   --   --   --   ?ALT 35  --   --   --   --   --   ?ALKPHOS 40  --   --   --   --   --   ?BILITOT 0.6  --   --   --   --   --   ?PROT 7.1  --   --   --   --   --   ?ALBUMIN 3.2* 2.9* 2.8* 2.7* 2.5* 2.5*  ? ? ?Recent Labs  ?Lab 10/16/2021 ?0740  ?LIPASE 38  ? ? ?  No results for input(s): AMMONIA in the last 168 hours. ?Coagulation profile ?No results for input(s): INR, PROTIME in the last 168 hours. ?COVID-19 Labs ? ?Recent Labs  ?  10/08/21 ?2836 10/09/21 ?0423 10/10/21 ?0409  ?DDIMER 2.64* 3.49* 4.12*  ?FERRITIN 246 238 232  ?CRP 18.8* 14.2* 10.5*  ? ? ? ?Lab Results  ?Component Value Date  ? SARSCOV2NAA POSITIVE (A) 10/03/2021  ? [place] NEGATIVE 07/06/2019  ? ? ?CBC: ?Recent Labs  ?Lab 09/19/2021 ?0740 10/08/21 ?6294 10/09/21 ?0423 10/10/21 ?0409  ?WBC 12.6* 12.6* 12.3* 12.2*  ?NEUTROABS 11.4* 11.2* 11.0* 10.9*  ?HGB 10.9* 10.0* 9.4* 9.2*  ?HCT 33.4* 31.3* 29.1* 27.5*  ?MCV 86.3 89.2 87.9 85.7  ?PLT 289 299 318 307  ? ? ?Cardiac Enzymes: ?No results for input(s): CKTOTAL, CKMB, CKMBINDEX, TROPONINI in the last 168 hours. ?BNP (last 3 results) ?No results for input(s): PROBNP in the last 8760 hours. ?CBG: ?Recent Labs  ?Lab 10/09/21 ?1732 10/09/21 ?2112 10/09/21 ?2135 10/09/21 ?2216 10/10/21 ?7654  ?GLUCAP 122* 37* 174* 136* 106*  ? ? ?D-Dimer: ?Recent Labs  ?  10/09/21 ?0423 10/10/21 ?0409  ?DDIMER 3.49* 4.12*  ? ? ?Hgb A1c: ?Recent Labs  ?  10/16/2021 ?1604  ?HGBA1C 7.4*  ? ? ?Lipid Profile: ?No results for input(s): CHOL, HDL, LDLCALC, TRIG, CHOLHDL, LDLDIRECT in the last 72 hours. ?Thyroid function studies: ?No results for input(s): TSH, T4TOTAL, T3FREE, THYROIDAB in the last 72 hours. ? ?Invalid input(s): FREET3 ?Anemia work up: ?Recent Labs  ?  10/09/21 ?0423 10/10/21 ?0409  ?FERRITIN 238 232  ? ? ?Sepsis Labs: ?Recent Labs  ?Lab 09/18/2021 ?0740 09/20/2021 ?6503 10/08/21 ?5465 10/09/21 ?0423 10/10/21 ?0409  ?PROCALCITON   --  >150.00  --   --   --   ?WBC 12.6*  --  12.6* 12.3* 12.2*  ?LATICACIDVEN 0.9  --   --   --   --   ? ? ?Microbiology ?Recent Results (from the past 240 hour(s))  ?Resp Panel by RT-PCR (Flu A&B, Covid) Nasopharyn

## 2021-10-10 NOTE — Consult Note (Signed)
Nephrology Consult  ? ?Assessment/Recommendations:  ? ?Acute on chronic hyponatremia, asymptomatic ?-Na stable with isotonic fluids initially, Na worsened thereafter with diuretics use ?-initial urine studies pointing towards hypovolemia ?-will repeat urine studies and osm to confirm etiology (there is always a possibility of SIADH given her pulmonary issues), agree with holding lasix (last dose 3/24 AM)--let's see if this helps improves her Na. Continue with fluid restriction for now ?-please check serial Na, repeat ordered for 20:00. If lower then would consider 3% NS in the interim ? ?AKI on CKD3 ?-baseline Cr entirely unclear, last Cr in 05/2021 was 1.67 which seems to be out of her baseline range. Seems that her baseline is truly around 1.2 ?-etiology of her AKI is likely related to pre-renal injury in the context of diuretics/sepsis ?-lasix on hold, agree with holding ARB for now ?-UA ?-renal u/s-r/o obstruction ?-Avoid nephrotoxic medications including NSAIDs and iodinated intravenous contrast exposure unless the latter is absolutely indicated.  Preferred narcotic agents for pain control are hydromorphone, fentanyl, and methadone. Morphine should not be used. Avoid Baclofen and avoid oral sodium phosphate and magnesium citrate based laxatives / bowel preps. Continue strict Input and Output monitoring. Will monitor the patient closely with you and intervene or adjust therapy as indicated by changes in clinical status/labs ? ?AHRF, COVID-19 pneumonia superimposed with CAP ?-abs per primary ? ?Metabolic acidosis ?-will start nahco3 PO ? ?Hypertension: ?-on coreg and amlodipine ? ?Chronic dCHF ?-appears euvolemic on exam, holding lasix ? ?Anemia due to chronic disease: ?-Transfuse for Hgb<7 g/dL ?-monitor for now, hgb stable ? ?Diabetes Mellitus Type 2 with Hyperglycemia ?-per primary service ? ? ?Recommendations conveyed to primary service.  ? ? ?Athleen Feltner Candiss Norse ?[facility] ?10/10/2021 ?7:23  PM ? ? ?_____________________________________________________________________________________ ? ? ?History of Present Illness: Carla Foster is a/an 81 y.o. female with a past medical history of CKD 3, pulmonary hypertension, DM 2, hypertension, history of breast cancer, hyperlipidemia who presents to [facility] with shortness of breath and cough.  Found to have COVID-19 with superimposed bacterial pneumonia.  She was out of the window for COVID antivirals.  She was found to have hyponatremia, she did receive normal saline up until 3/22.  She presented with a sodium of 124 and was around 126 on 3/22.  Thereafter, she was started on Lasix and her sodium has been between 120-121, Lasix currently on hold. ?Patient seen and examined bedside. Patient is very hard of hearing. She reports that she has been eating and drinking okay for the last day or so. She also reports that her breathing is slightly better today, still with a lot of coughing. She otherwise denies any chest pain, dizziness, changes in vision, dizziness, swelling, diarrhea, n/v. ? ?Medications:  ?Current Facility-Administered Medications  ?Medication Dose Route Frequency Provider Last Rate Last Admin  ? albuterol (PROVENTIL) (2.5 MG/3ML) 0.083% nebulizer solution 2.5 mg  2.5 mg Nebulization Q4H PRN Marylyn Ishihara, Tyrone A, DO      ? amLODipine (NORVASC) tablet 10 mg  10 mg Oral q morning Kyle, Tyrone A, DO   10 mg at 10/10/21 0851  ? ascorbic acid (VITAMIN C) tablet 500 mg  500 mg Oral Q lunch Marylyn Ishihara, Tyrone A, DO   500 mg at 10/10/21 1122  ? azithromycin (ZITHROMAX) tablet 500 mg  500 mg Oral Daily Kyle, Tyrone A, DO   500 mg at 10/10/21 2800  ? calcium carbonate (TUMS - dosed in mg elemental calcium) chewable tablet 200 mg of elemental calcium  1 tablet  Oral Q6H PRN Marylyn Ishihara, Tyrone A, DO   200 mg of elemental calcium at 10/08/21 1703  ? carvedilol (COREG) tablet 25 mg  25 mg Oral BID WC Kyle, Tyrone A, DO   25 mg at 10/10/21 1633  ? cefTRIAXone (ROCEPHIN) 2 g in sodium  chloride 0.9 % 100 mL IVPB  2 g Intravenous Q24H Kyle, Tyrone A, DO 200 mL/hr at 10/10/21 0858 2 g at 10/10/21 0858  ? chlorpheniramine-HYDROcodone 10-8 MG/5ML suspension 5 mL  5 mL Oral Q12H PRN Marylyn Ishihara, Tyrone A, DO   5 mL at 10/09/2021 2249  ? diphenhydrAMINE (BENADRYL) capsule 25 mg  25 mg Oral Q6H PRN Marylyn Ishihara, Tyrone A, DO   25 mg at 10/16/2021 2026  ? doxazosin (CARDURA) tablet 4 mg  4 mg Oral QHS Kyle, Tyrone A, DO   4 mg at 10/09/21 2221  ? fenofibrate tablet 160 mg  160 mg Oral q morning Marylyn Ishihara, Tyrone A, DO   160 mg at 10/10/21 1122  ? ferrous sulfate tablet 325 mg  325 mg Oral Q lunch Cherylann Ratel A, DO   325 mg at 10/10/21 1122  ? guaiFENesin-dextromethorphan (ROBITUSSIN DM) 100-10 MG/5ML syrup 10 mL  10 mL Oral Q4H PRN Marylyn Ishihara, Tyrone A, DO   10 mL at 10/05/2021 2026  ? insulin aspart (novoLOG) injection 0-5 Units  0-5 Units Subcutaneous QHS Kyle, Tyrone A, DO   2 Units at 10/08/21 2100  ? insulin aspart (novoLOG) injection 0-9 Units  0-9 Units Subcutaneous TID WC Kyle, Tyrone A, DO   1 Units at 10/10/21 1637  ? insulin detemir (LEVEMIR) injection 5 Units  5 Units Subcutaneous Daily Charlynne Cousins, MD   5 Units at 10/10/21 1121  ? ipratropium-albuterol (DUONEB) 0.5-2.5 (3) MG/3ML nebulizer solution 3 mL  3 mL Nebulization Q6H PRN Marylyn Ishihara, Tyrone A, DO   3 mL at 10/08/21 2315  ? melatonin tablet 3 mg  3 mg Oral QHS Charlynne Cousins, MD      ? mometasone-formoterol [facility]) 100-5 MCG/ACT inhaler 2 puff  2 puff Inhalation BID Cherylann Ratel A, DO   2 puff at 10/10/21 0840  ? mupirocin ointment (BACTROBAN) 2 %   Nasal BID Charlynne Cousins, MD   Given at 10/10/21 [phone]  ? ondansetron (ZOFRAN) tablet 4 mg  4 mg Oral Q6H PRN Cherylann Ratel A, DO      ? Or  ? ondansetron (ZOFRAN) injection 4 mg  4 mg Intravenous Q6H PRN Marylyn Ishihara, Tyrone A, DO      ? oxyCODONE (Oxy IR/ROXICODONE) immediate release tablet 5 mg  5 mg Oral Q4H PRN Marylyn Ishihara, Tyrone A, DO   5 mg at 10/10/21 1634  ? simvastatin (ZOCOR) tablet 20 mg  20 mg Oral QHS Kyle,  Tyrone A, DO   20 mg at 10/09/21 2221  ? zinc sulfate capsule 220 mg  220 mg Oral Daily Kyle, Tyrone A, DO   220 mg at 10/10/21 2993  ?  ? ?ALLERGIES ?Tiazac [diltiazem hcl] ? ?MEDICAL HISTORY ?Past Medical History:  ?Diagnosis Date  ? Acute blood loss anemia   ? AKI (acute kidney injury) ([place])   ? Asthma   ? CKD (chronic kidney disease) stage 3, GFR 30-59 ml/min ([place]) 01/06/2019  ? Complication of anesthesia   ? nausea  ? Diabetes mellitus   ? type 2  ? Family history of upper GI bleeding 09/05/2018  ? Dr. Carlean Purl, hospitalized: egd 11/2017  ? GERD (gastroesophageal reflux disease)   ? GI bleed   ?  H/O: upper GI bleed 09/05/2018  ? 11/2017, Peptic ulcer by EGD, Dr. Carlean Purl; hospitalized  ? Hematemesis   ? History of radiation therapy 08/07/19- 09/01/19  ? Right Breast 15 fx of 2.67 Gy each to total 40.05 Gy. Right breast boost of 5 fx of 2 Gy each to total 10 Gy  ? Hyperlipidemia   ? Hypertension   ? Melena   ? Osteopenia 04/28/2019  ? dexa 04/2019 T = - 2.3 L/spine lowest. Recheck 2 years.   ? PAD (peripheral artery disease) ([place]) - asymptomatic 04/27/2019  ? Quantiflo ABI's by in home nurse assessment: Bilateral abnl: betweein 0.6 and 0.7 Can't tolerate aspirin  ? PONV (postoperative nausea and vomiting)   ?  ? ?SOCIAL HISTORY ?Social History  ? ?Socioeconomic History  ? Marital status: Widowed  ?  Spouse name: Not on file  ? Number of children: Not on file  ? Years of education: Not on file  ? Highest education level: Not on file  ?Occupational History  ? Not on file  ?Tobacco Use  ? Smoking status: Former  ?  Packs/day: 1.00  ?  Years: 18.00  ?  Pack years: 18.00  ?  Types: Cigarettes  ?  Quit date: 01/13/1974  ?  Years since quitting: 47.7  ? Smokeless tobacco: Never  ?Vaping Use  ? Vaping Use: Never used  ?Substance and Sexual Activity  ? Alcohol use: No  ? Drug use: No  ? Sexual activity: Not Currently  ?Other Topics Concern  ? Not on file  ?Social History Narrative  ? Not on file  ? ?Social Determinants of Health   ? ?Financial Resource Strain: Not on file  ?Food Insecurity: No Food Insecurity  ? Worried About Charity fundraiser in the Last Year: Never true  ? Ran Out of Food in the Last Year: Never true  ?Transportation Need

## 2021-10-10 NOTE — Care Management Important Message (Signed)
Important Message ? ?Patient Details IM Letter placed in Patients room. ?Name: Carla Foster ?MRN: [id number] ?Date of Birth: [birth year] ? ? ?Medicare Important Message Given:  Yes ? ? ? ? ?Kerin Salen ?10/10/2021, 11:42 AM ?

## 2021-10-10 NOTE — Progress Notes (Signed)
Inpatient Diabetes Program Recommendations ? ?AACE/ADA: New Consensus Statement on Inpatient Glycemic Control (2015) ? ?Target Ranges:  Prepandial:   less than 140 mg/dL ?     Peak postprandial:   less than 180 mg/dL (1-2 hours) ?     Critically ill patients:  140 - 180 mg/dL  ? ?Lab Results  ?Component Value Date  ? GLUCAP 106 (H) 10/10/2021  ? HGBA1C 7.4 (H) 09/24/2021  ? ? ?Review of Glycemic Control ? Latest Reference Range & Units 10/09/21 21:12 10/09/21 21:35 10/09/21 22:16 10/10/21 07:38  ?Glucose-Capillary 70 - 99 mg/dL 37 (LL) 174 (H) 136 (H) 106 (H)  ?(LL): Data is critically low ?(H): Data is abnormally high ? ?Diabetes history: DM2 ?Outpatient Diabetes medications: Metformin XR 500 mg QD ?Current orders for Inpatient glycemic control: Levemir 5 units QAM, Novolog 0-9 units TID ? ?Inpatient Diabetes Program Recommendations:   ? ?-DC Levemir ?-If still running low decrease Novolog to 0-6 units TID ? ?Will continue to follow while inpatient. ? ?Thank you, ?Reche Dixon, MSN, RN ?Diabetes Coordinator ?Inpatient Diabetes Program ?[phone] (team pager from 8a-5p) ? ? ? ?

## 2021-10-10 NOTE — Consult Note (Signed)
[facility] CM Inpatient Consult ? ? ?10/10/2021 ? ?Carla Foster ?1941-05-17 ?[id number] ? ?[facility] CM) ?  ?Patient chart reviewed with noted high risk score for unplanned readmission. Per review, patient's primary provider office offers embedded chronic care management services. Patient is active with pharmacist for chronic disease management services. ? ?Plan: Will continue to follow for progression and disposition plans. ? ?Of note, [facility] Care Management services does not replace or interfere with any services that are arranged by inpatient case management or social work.  ? ?Netta Cedars, MSN, RN ?[facility] Liaison ?Toll free office [phone] ?

## 2021-10-11 DIAGNOSIS — D638 Anemia in other chronic diseases classified elsewhere: Secondary | ICD-10-CM | POA: Diagnosis not present

## 2021-10-11 DIAGNOSIS — R0781 Pleurodynia: Secondary | ICD-10-CM

## 2021-10-11 DIAGNOSIS — I5032 Chronic diastolic (congestive) heart failure: Secondary | ICD-10-CM | POA: Diagnosis not present

## 2021-10-11 DIAGNOSIS — J189 Pneumonia, unspecified organism: Secondary | ICD-10-CM | POA: Diagnosis not present

## 2021-10-11 DIAGNOSIS — E871 Hypo-osmolality and hyponatremia: Secondary | ICD-10-CM | POA: Diagnosis not present

## 2021-10-11 DIAGNOSIS — R778 Other specified abnormalities of plasma proteins: Secondary | ICD-10-CM

## 2021-10-11 LAB — GLUCOSE, CAPILLARY
Glucose-Capillary: 125 mg/dL — ABNORMAL HIGH (ref 70–99)
Glucose-Capillary: 103 mg/dL — ABNORMAL HIGH (ref 70–99)
Glucose-Capillary: 170 mg/dL — ABNORMAL HIGH (ref 70–99)
Glucose-Capillary: 46 mg/dL — ABNORMAL LOW (ref 70–99)
Glucose-Capillary: 84 mg/dL (ref 70–99)

## 2021-10-11 LAB — BASIC METABOLIC PANEL
Anion gap: 9 (ref 5–15)
Anion gap: 6 (ref 5–15)
BUN: 38 mg/dL — ABNORMAL HIGH (ref 8–23)
BUN: 39 mg/dL — ABNORMAL HIGH (ref 8–23)
CO2: 19 mmol/L — ABNORMAL LOW (ref 22–32)
CO2: 22 mmol/L (ref 22–32)
Calcium: 8.1 mg/dL — ABNORMAL LOW (ref 8.9–10.3)
Calcium: 8.4 mg/dL — ABNORMAL LOW (ref 8.9–10.3)
Chloride: 93 mmol/L — ABNORMAL LOW (ref 98–111)
Chloride: 95 mmol/L — ABNORMAL LOW (ref 98–111)
Creatinine, Ser: 1.5 mg/dL — ABNORMAL HIGH (ref 0.44–1.00)
Creatinine, Ser: 1.38 mg/dL — ABNORMAL HIGH (ref 0.44–1.00)
GFR, Estimated: 35 mL/min — ABNORMAL LOW (ref >60)
GFR, Estimated: 38 mL/min — ABNORMAL LOW (ref >60)
Glucose, Bld: 117 mg/dL — ABNORMAL HIGH (ref 70–99)
Glucose, Bld: 60 mg/dL — ABNORMAL LOW (ref 70–99)
Potassium: 4 mmol/L (ref 3.5–5.1)
Potassium: 4.1 mmol/L (ref 3.5–5.1)
Sodium: 121 mmol/L — ABNORMAL LOW (ref 135–145)
Sodium: 123 mmol/L — ABNORMAL LOW (ref 135–145)

## 2021-10-11 LAB — OSMOLALITY
Osmolality: 137 mOsm/kg — CL (ref 275–295)
Osmolality: 262 mOsm/kg — ABNORMAL LOW (ref 275–295)

## 2021-10-11 LAB — SODIUM, URINE, RANDOM: Sodium, Ur: 11 mmol/L

## 2021-10-11 LAB — SODIUM: Sodium: 122 mmol/L — ABNORMAL LOW (ref 135–145)

## 2021-10-11 MED ORDER — ORAL CARE MOUTH RINSE
15.0000 mL | Freq: Two times a day (BID) | OROMUCOSAL | Status: DC
  Administered 2021-10-11 – 2021-10-14 (×7): 15 mL via OROMUCOSAL

## 2021-10-11 MED ORDER — DEXTROSE 50 % IV SOLN
INTRAVENOUS | Status: AC
  Filled 2021-10-11: qty 50

## 2021-10-11 MED ORDER — SODIUM CHLORIDE 0.9 % IV SOLN: INTRAVENOUS | Status: DC

## 2021-10-11 MED ORDER — DEXTROSE 50 % IV SOLN
25.0000 g | INTRAVENOUS | Status: AC
  Administered 2021-10-11: 25 g via INTRAVENOUS

## 2021-10-11 NOTE — Progress Notes (Signed)
Notified on call provider due to patient being restless and patient's O2 sats dropping down to 87%, and then back up to 94% on 6L of oxygen. Prior to bumping patient up to 6L of oxygen, patient had been on 3L. Respiratory Therapist was also notified, and came up to give patient a breathing treatment.  ? ?Patient was asked several times if patient was in pain, and patient denied. ?

## 2021-10-11 NOTE — Plan of Care (Signed)
?  Problem: Clinical Measurements: ?Goal: Ability to maintain a body temperature in the normal range will improve ?Outcome: Progressing ?  ?Problem: Respiratory: ?Goal: Ability to maintain a clear airway will improve ?Outcome: Progressing ?  ?Problem: Education: ?Goal: Knowledge of General Education information will improve ?Description: Including pain rating scale, medication(s)/side effects and non-pharmacologic comfort measures ?Outcome: Progressing ?  ?Problem: Coping: ?Goal: Level of anxiety will decrease ?Outcome: Progressing ?  ?Problem: Elimination: ?Goal: Will not experience complications related to urinary retention ?Outcome: Progressing ?  ?Problem: Pain Managment: ?Goal: General experience of comfort will improve ?Outcome: Progressing ?  ?Problem: Safety: ?Goal: Ability to remain free from injury will improve ?Outcome: Progressing ?  ?Problem: Skin Integrity: ?Goal: Risk for impaired skin integrity will decrease ?Outcome: Progressing ?  ?Problem: Coping: ?Goal: Psychosocial and spiritual needs will be supported ?Outcome: Progressing ?  ?Problem: Respiratory: ?Goal: Will maintain a patent airway ?Outcome: Progressing ?  ?

## 2021-10-11 NOTE — Progress Notes (Signed)
Hypoglycemic Event ? ?CBG: 46 ? ?Treatment: D50 50 mL (25 gm) ? ?Symptoms: None ? ?Follow-up CBG: Time:1702   CBG Result:170 ? ?Possible Reasons for Event: Inadequate meal intake ? ?Comments/MD notified: Dr. Aileen Fass  ? ? ? ?Stacey Drain ? ? ?

## 2021-10-11 NOTE — Progress Notes (Signed)
[facility] ?PROGRESS NOTE ? ? ? ?Progress Note  ?Carla Foster  ONG:[id number] DOB: [birth year] DOA: 10/15/2021 ?PCP: Leamon Arnt, MD  ? ? ? ?Brief Narrative:  ? ?Carla Foster is an 81 y.o. female past medical history significant for essential hypertension diabetes mellitus type 2 chronic kidney disease stage IIIb history of cancer presents with cough and dyspnea that started 10 days prior to admission.  She has required more assistance going around the house not using her walker, eventually her cough became productive chest x-ray showed community-acquired pneumonia. ? ? ? ?Assessment/Plan:  ? ?Acute respiratory failure with hypoxia secondarily to COVID-19 pneumonia with superimposed community-acquired pneumonia: ?Outside the window for antiviral therapy. ?Afebrile continue Augmentin and doxycycline for 2 additional days. ?She is to require oxygen we will try to wean to room air ambulating check saturations with ambulation. ?Out of bed to chair, continues into spirometry and flutter valve. ? ?Severe sepsis due to community-acquired pneumonia: ?She was tachycardic leukocytosis and now requiring 2 L of oxygen to keep saturations greater than 92%. ?Continue oral antibiotics for 2 additional days.   ? ?Hypovolemic hyponatremia: ?Fractional excretion of sodium less than 1, she was fluid resuscitated her sodium continue to worsen this morning. ?Renal was consulted due to worsening hyponatremia, renal recommended and agreed with holding Lasix and continue fluid restriction, her sodium this morning is 141. ?Appreciate assistance. ? ?Acute kidney injury on chronic kidney disease stage III: ?With a baseline creatinine around to the recommended to hold ARB and Lasix and recheck a basic metabolic panel. ? ?Elevated troponins: ?Elevation in troponin demand ischemia. ? ?Diabetes mellitus type 2 with hyperglycemia: ?An A1c of 7.4, had an episode of hypoglycemia, with meal coverage was held. ?Had an episode of  hypoglycemia overnight discontinue long-acting insulin continue sliding scale. ? ?Chronic kidney disease stage IIIb: ?Creatinine appears to be at baseline, hold IV fluids. ? ?Essential hypertension: ?Continue Norvasc and Coreg  ?Hold diuretic therapy. ? ?Chronic diastolic heart failure: ?Hold diuretic therapy and ARB. ? ?Anemia of chronic renal disease: ?No signs of overt bleeding.  Hemoglobin is currently stable. ? ? ? ?DVT prophylaxis: lovenox ?Family Communication:none ?Status is: Inpatient ?Remains inpatient appropriate because: Sepsis due to community-acquired pneumonia ? ? ? ?Code Status:  ? ?  ?Code Status Orders  ?(From admission, onward)  ?  ? ? ?  ? ?  Start     Ordered  ? 09/26/2021 1311  Do not attempt resuscitation (DNR)  Continuous       ?Question Answer Comment  ?In the event of cardiac or respiratory ARREST Do not call a ?code blue?   ?In the event of cardiac or respiratory ARREST Do not perform Intubation, CPR, defibrillation or ACLS   ?In the event of cardiac or respiratory ARREST Use medication by any route, position, wound care, and other measures to relive pain and suffering. May use oxygen, suction and manual treatment of airway obstruction as needed for comfort.   ?Comments confirmed with pt/dtrs   ?  ? 10/05/2021 1310  ? ?  ?  ? ?  ? ?Code Status History   ? ? Date Active Date Inactive Code Status Order ID Comments User Context  ? 07/26/2018 0048 08/02/2018 1818 Full Code [id number]  Corey Harold, NP ED  ? 11/20/2017 1301 11/23/2017 1446 Full Code [id number]  Radene Gunning, NP ED  ? ?  ? ?Advance Directive Documentation   ? ?Flowsheet Row Most Recent Value  ?Type of Advance Directive Healthcare  Power of Attorney, Living will, Out of facility DNR (pink MOST or yellow form)  ?Pre-existing out of facility DNR order (yellow form or pink MOST form) Physician notified to receive inpatient order  ?"MOST" Form in Place? --  ? ?  ? ? ? ? ?IV Access:  ? ?Peripheral IV ? ? ?Procedures and diagnostic studies:   ? ?No results found. ? ? ?Medical Consultants:  ? ?None. ? ? ?Subjective:  ? ? ?Carla Foster no new complaints just woke up this morning. ? ?Objective:  ? ? ?Vitals:  ? 10/10/21 2250 10/11/21 0444 10/11/21 0445 10/11/21 0515  ?BP: 129/61 105/71    ?Pulse:  95    ?Resp:  18    ?Temp:  98 ?F (36.7 ?C)    ?TempSrc:  Oral    ?SpO2:  98%  98%  ?Weight:   68.9 kg   ?Height:      ? ?SpO2: 98 % ?O2 Flow Rate (L/min): 6 L/min ? ? ?Intake/Output Summary (Last 24 hours) at 10/11/2021 0825 ?Last data filed at 10/10/2021 2300 ?Gross per 24 hour  ?Intake 900 ml  ?Output 375 ml  ?Net 525 ml  ? ? ?Filed Weights  ? 10/09/21 0500 10/10/21 0500 10/11/21 0445  ?Weight: 67.1 kg 67.2 kg 68.9 kg  ? ? ?Exam: ?General exam: In no acute distress. ?Respiratory system: Good air movement and clear to auscultation. ?Cardiovascular system: S1 & S2 heard, RRR. No JVD. ?Gastrointestinal system: Abdomen is nondistended, soft and nontender.  ?Extremities: No pedal edema. ?Skin: No rashes, lesions or ulcers ? ? ?Data Reviewed:  ? ? ?Labs: ?Basic Metabolic Panel: ?Recent Labs  ?Lab 10/06/2021 ?1604 10/16/2021 ?2212 10/08/21 ?1610 10/08/21 ?0933 10/08/21 ?1507 10/09/21 ?0423 10/09/21 ?1438 10/10/21 ?0409 10/10/21 ?9604 10/10/21 ?1538 10/10/21 ?2005 10/11/21 ?0250  ?NA 126* 123* 124* 125* 126* 120* 121*  --  121* 120* 121* 121*  ?K 4.8 4.2 4.2 4.4 4.1 4.2 4.0  --  3.8 4.1  --  4.0  ?CL 98 97* 97* 95* 99 95* 95*  --  95* 94*  --  93*  ?CO2 20* 18* 14* 20* 20* 17* 19*  --  19* 18*  --  19*  ?GLUCOSE 225* 97 157* 176* 75 159* 154*  --  85 156*  --  117*  ?BUN 45* 41* 40* 41* 41* 38* 41*  --  40* 39*  --  38*  ?CREATININE 1.21* 1.15* 1.17* 1.28* 1.24* 1.23* 1.38*  --  1.33* 1.44*  --  1.50*  ?CALCIUM 8.1* 7.9* 8.2* 8.2* 8.0* 7.9* 8.1*  --  8.0* 8.0*  --  8.1*  ?MG  --   --  2.1  --   --  1.9  --  1.8  --   --   --   --   ?PHOS 2.5 2.1* 2.2* 2.4* 2.0*  --   --   --   --   --   --   --   ? ? ?GFR ?Estimated Creatinine Clearance: 27.1 mL/min (A) (by C-G formula  based on SCr of 1.5 mg/dL (H)). ?Liver Function Tests: ?Recent Labs  ?Lab 10/03/2021 ?0740 09/26/2021 ?1604 09/18/2021 ?2212 10/08/21 ?5409 10/08/21 ?0933 10/08/21 ?1507  ?AST 45*  --   --   --   --   --   ?ALT 35  --   --   --   --   --   ?ALKPHOS 40  --   --   --   --   --   ?  BILITOT 0.6  --   --   --   --   --   ?PROT 7.1  --   --   --   --   --   ?ALBUMIN 3.2* 2.9* 2.8* 2.7* 2.5* 2.5*  ? ? ?Recent Labs  ?Lab 09/25/2021 ?0740  ?LIPASE 38  ? ? ?No results for input(s): AMMONIA in the last 168 hours. ?Coagulation profile ?No results for input(s): INR, PROTIME in the last 168 hours. ?COVID-19 Labs ? ?Recent Labs  ?  10/09/21 ?0423 10/10/21 ?0409  ?DDIMER 3.49* 4.12*  ?FERRITIN 238 232  ?CRP 14.2* 10.5*  ? ? ? ?Lab Results  ?Component Value Date  ? SARSCOV2NAA POSITIVE (A) 09/24/2021  ? [place] NEGATIVE 07/06/2019  ? ? ?CBC: ?Recent Labs  ?Lab 09/28/2021 ?0740 10/08/21 ?6063 10/09/21 ?0423 10/10/21 ?0409  ?WBC 12.6* 12.6* 12.3* 12.2*  ?NEUTROABS 11.4* 11.2* 11.0* 10.9*  ?HGB 10.9* 10.0* 9.4* 9.2*  ?HCT 33.4* 31.3* 29.1* 27.5*  ?MCV 86.3 89.2 87.9 85.7  ?PLT 289 299 318 307  ? ? ?Cardiac Enzymes: ?No results for input(s): CKTOTAL, CKMB, CKMBINDEX, TROPONINI in the last 168 hours. ?BNP (last 3 results) ?No results for input(s): PROBNP in the last 8760 hours. ?CBG: ?Recent Labs  ?Lab 10/09/21 ?2216 10/10/21 ?0738 10/10/21 ?1124 10/10/21 ?1634 10/10/21 ?2252  ?GLUCAP 136* 106* 162* 148* 134*  ? ? ?D-Dimer: ?Recent Labs  ?  10/09/21 ?0423 10/10/21 ?0409  ?DDIMER 3.49* 4.12*  ? ? ?Hgb A1c: ?No results for input(s): HGBA1C in the last 72 hours. ? ?Lipid Profile: ?No results for input(s): CHOL, HDL, LDLCALC, TRIG, CHOLHDL, LDLDIRECT in the last 72 hours. ?Thyroid function studies: ?No results for input(s): TSH, T4TOTAL, T3FREE, THYROIDAB in the last 72 hours. ? ?Invalid input(s): FREET3 ?Anemia work up: ?Recent Labs  ?  10/09/21 ?0423 10/10/21 ?0409  ?FERRITIN 238 232  ? ? ?Sepsis Labs: ?Recent Labs  ?Lab 09/23/2021 ?0740  10/05/2021 ?0160 10/08/21 ?1093 10/09/21 ?0423 10/10/21 ?0409  ?PROCALCITON  --  >150.00  --   --   --   ?WBC 12.6*  --  12.6* 12.3* 12.2*  ?LATICACIDVEN 0.9  --   --   --   --   ? ? ?Microbiology ?Recent Results (from the past 2

## 2021-10-11 NOTE — Progress Notes (Signed)
[facility] ?Progress Note ? ?Summary: 81 yo w/ CKD 3, pHTN, DM2, HTN, HL presented w/ SOB and cough found to have COVID+ infection w/ assoc bacterial pna. Na+ was low at 124 on admission and 126 on 3/22. She was started on Lasix and Na+ dropped to 120-121.  ? ?Subjective: Na+ 121 again this am.   ? ?Patient not seen directly today given COVID-19 + status, utilizing data taken from chart +/- discussions w/ providers and staff.  ? ? ?Vitals:  ? 10/10/21 2250 10/11/21 0444 10/11/21 0445 10/11/21 0515  ?BP: 129/61 105/71    ?Pulse:  95    ?Resp:  18    ?Temp:  98 ?F (36.7 ?C)    ?TempSrc:  Oral    ?SpO2:  98%  98%  ?Weight:   68.9 kg   ?Height:      ? ? ?Exam: ?Patient not seen directly today given COVID-19 + status, utilizing data taken from chart +/- discussions w/ providers and staff.  ? ? ?     ?     Date  Creat  eGFR ?    2021  1.26- 1.44 ?    2022  1.19- 1.67 28- 43, CKD 3b ?    09/29/2021  1.39  ?    10/11/21  1.50 ? ?     3/21 - UNa 15, UOsm 411 ?    3/25- pending ?     UA 6-10 wbc, 0-5 rbc, 100 prot ?      ? ? ?Assessment/ Plan: ?Hyponatremia - Na+ low at 124 on admission , 126 on 3/22. After lasix was started Na+ dropped to 120-121. Initial urine studies pointed towards hypovolemia. Lasix was held. Na stable at 121 this am. Will need 0.9% saline to recover, plan gentle IVF"s at 65 cc/hr.  ?CKD 3b - b/l creat 1.2- 1.6 from 2022.  Creat here is 1.3- 1.5. Renal US pending. UA unremarkable.  ?AHRF / COVID 19 pna / CAP - on IV abx ?HTN - on coreg, norvasc ?Chronic dCHF - appears euvolemic on exam. Lasix dc'd.  ?DM2 ?Anemia chronic disease ? ? ? ? ?Carla Foster ?10/11/2021, 8:16 AM ? ? ?Recent Labs  ?Lab 10/08/21 ?0933 10/08/21 ?1507 10/09/21 ?0423 10/09/21 ?1438 10/10/21 ?0409 10/10/21 ?8527 10/10/21 ?1538 10/11/21 ?0250  ?HGB  --   --  9.4*  --  9.2*  --   --   --   ?ALBUMIN 2.5* 2.5*  --   --   --   --   --   --   ?CALCIUM 8.2* 8.0* 7.9*   < >  --    < > 8.0* 8.1*  ?PHOS 2.4* 2.0*  --   --   --   --    --   --   ?CREATININE 1.28* 1.24* 1.23*   < >  --    < > 1.44* 1.50*  ?K 4.4 4.1 4.2   < >  --    < > 4.1 4.0  ? < > = values in this interval not displayed.  ? ?Inpatient medications: ? amLODipine  10 mg Oral q morning  ? vitamin C  500 mg Oral Q lunch  ? azithromycin  500 mg Oral Daily  ? carvedilol  25 mg Oral BID WC  ? doxazosin  4 mg Oral QHS  ? fenofibrate  160 mg Oral q morning  ? ferrous sulfate  325 mg Oral Q lunch  ? insulin aspart  0-5 Units  Subcutaneous QHS  ? insulin aspart  0-9 Units Subcutaneous TID WC  ? insulin detemir  5 Units Subcutaneous Daily  ? mouth rinse  15 mL Mouth Rinse BID  ? melatonin  3 mg Oral QHS  ? mometasone-formoterol  2 puff Inhalation BID  ? mupirocin ointment   Nasal BID  ? simvastatin  20 mg Oral QHS  ? sodium bicarbonate  650 mg Oral BID  ? zinc sulfate  220 mg Oral Daily  ? ? cefTRIAXone (ROCEPHIN)  IV 2 g (10/10/21 0858)  ? ?albuterol, calcium carbonate, chlorpheniramine-HYDROcodone, diphenhydrAMINE, guaiFENesin-dextromethorphan, ipratropium-albuterol, ondansetron **OR** ondansetron (ZOFRAN) IV, oxyCODONE ? ? ? ? ? ? ?

## 2021-10-11 NOTE — Evaluation (Signed)
Physical Therapy Evaluation ?Patient Details ?Name: Carla Foster ?MRN: [id number] ?DOB: [birth year] ?Today's Date: 10/11/2021 ? ?History of Present Illness ? Pt is an 81 y.o. female presenting with cough and dyspnea. Admitted for treatment of acute respiratory failure with hypoxia secondarily to COVID-19 pneumonia with superimposed community-acquired pneumonia, hypovolemic hyponatremia, and sepsis . PMH significant for essential hypertension, asthma, HLD, PAD, diabetes mellitus type 2, chronic kidney disease, history of cancer , and cervical discectomy (2002). ?  ?Clinical Impression ? Pt is an 81 y.o. female with above HPI resulting in the deficits listed below (see PT Problem List). Pt's daughter present to provide PLOF and reports that pt is independent with ADLs and mobility at baseline, has been using AD for about 1week when not feeling well. Pt performed sit to stand and step pivot transfers to recliner with MIN guard for safety and cues for safe hand placement. PT deferred further mobility today as pt extremely lethargic and required repeated stimulus to open eyes and difficulty keeping them open throughout. Anticipate pt will progress well with mobility when able to participate more. Recommend home with family assist based on today's performance during evaluation. D/c recommendations to be be updated as appropriate when pt more awake and able to participate.  Pt will benefit from skilled PT to maximize functional mobility and increase independence.  ? ? ?   ? ?Recommendations for follow up therapy are one component of a multi-disciplinary discharge planning process, led by the attending physician.  Recommendations may be updated based on patient status, additional functional criteria and insurance authorization. ? ?Follow Up Recommendations Other (comment) (need for HHPT to be determined at later session (eval limited due to pt lethargy)) ? ?  ?Assistance Recommended at Discharge Frequent or constant  Supervision/Assistance  ?Patient can return home with the following ? A little help with walking and/or transfers;A little help with bathing/dressing/bathroom;Help with stairs or ramp for entrance ? ?  ?Equipment Recommendations None recommended by PT (pt owns RW)  ?Recommendations for Other Services ?    ?  ?Functional Status Assessment Patient has had a recent decline in their functional status and demonstrates the ability to make significant improvements in function in a reasonable and predictable amount of time.  ? ?  ?Precautions / Restrictions Precautions ?Precautions: Fall ?Restrictions ?Weight Bearing Restrictions: No  ? ?  ? ?Mobility ? Bed Mobility ?Overal bed mobility: Needs Assistance ?Bed Mobility: Supine to Sit ?  ?  ?Supine to sit: Supervision, HOB elevated ?  ?  ?  ?  ? ?Transfers ?Overall transfer level: Needs assistance ?Equipment used: Rolling walker (2 wheels) ?Transfers: Sit to/from Stand, Bed to chair/wheelchair/BSC ?Sit to Stand: Min guard ?  ?Step pivot transfers: Min guard ?  ?  ?  ?General transfer comment: x2 from EOB; MIN guard for safety with transfers due to pt lethargy. Deferred ambulation today as pt seemingly falling asleep while standing and required cues for sequencing during transfer and hand over hand guidance for reaching back to sit in recliner chair. O2 100% on 6L [place]. ?  ? ?Ambulation/Gait ?  ?  ?  ?  ?  ?  ?  ?General Gait Details: deferred by PT due to pt's lethargy throughout session, difficulty staying awake. ? ?Stairs ?  ?  ?  ?  ?  ? ?Wheelchair Mobility ?  ? ?Modified Rankin (Stroke Patients Only) ?  ? ?  ? ?Balance Overall balance assessment: Needs assistance ?Sitting-balance support: Feet supported ?Sitting balance-Leahy Scale: Good ?  ?  ?  Standing balance support: Bilateral upper extremity supported, During functional activity, Reliant on assistive device for balance ?Standing balance-Leahy Scale: Poor ?  ?  ?  ?  ?  ?  ?  ?  ?  ?  ?  ?  ?   ? ? ? ?Pertinent  Vitals/Pain Pain Assessment ?Pain Assessment: No/denies pain  ? ? ?Home Living Family/patient expects to be discharged to:: Private residence ?Living Arrangements: Alone ?Available Help at Discharge: Family ?Type of Home: House ?Home Access: Stairs to enter ?Entrance Stairs-Rails: None ?Entrance Stairs-Number of Steps: 1 + 1 (3 stairs going into garage, R rail) ?  ?Home Layout: One level ?Home Equipment: Conservation officer, nature (2 wheels);Cane - quad;BSC/3in1;Shower seat;Grab bars - toilet;Hand held shower head ?Additional Comments: PLOF/home environemnt provided by pt's daughters due to pt's lethargy (reports pt has been sleeping all morning due to medications). daughters live next door and other daughter lives behind her. Daughter is there everyday, works part time, has granddaughters that can assist as needed.  ?  ?Prior Function Prior Level of Function : Independent/Modified Independent;Driving (limited distance driving) ?  ?  ?  ?  ?  ?  ?Mobility Comments: using RW/cane for about 1 week before admission since not feeling well. No AD use prior ?  ?  ? ? ?Hand Dominance  ?   ? ?  ?Extremity/Trunk Assessment  ? Upper Extremity Assessment ?Upper Extremity Assessment: Overall WFL for tasks assessed ?  ? ?Lower Extremity Assessment ?Lower Extremity Assessment: Generalized weakness ?  ? ?Cervical / Trunk Assessment ?Cervical / Trunk Assessment: Normal  ?Communication  ? Communication: HOH  ?Cognition Arousal/Alertness: Lethargic, Suspect due to medications ?Behavior During Therapy: [facility] for tasks assessed/performed ?Overall Cognitive Status: Difficult to assess ?  ?  ?  ?  ?  ?  ?  ?  ?  ?  ?  ?  ?  ?  ?  ?  ?General Comments: pt lethargic throughout, able to open eyes intermittently required repeated auditory/tactiel stimulus throughout for participation. ?  ?  ? ?  ?General Comments   ? ?  ?Exercises    ? ?Assessment/Plan  ?  ?PT Assessment Patient needs continued PT services  ?PT Problem List Decreased strength;Decreased  activity tolerance;Decreased balance;Decreased mobility;Decreased knowledge of use of DME ? ?   ?  ?PT Treatment Interventions DME instruction;Gait training;Stair training;Functional mobility training;Therapeutic activities;Balance training;Therapeutic exercise;Patient/family education   ? ?PT Goals (Current goals can be found in the Care Plan section)  ?Acute Rehab PT Goals ?Patient Stated Goal: Pt unable to state due to lethargy- pt's daughter with goal of pt returning home and increased arousal ?PT Goal Formulation: Patient unable to participate in goal setting ?Time For Goal Achievement: 10/25/21 ?Potential to Achieve Goals: Good ? ?  ?Frequency Min 3X/week ?  ? ? ?Co-evaluation   ?  ?  ?  ?  ? ? ?  ?AM-PAC PT "6 Clicks" Mobility  ?Outcome Measure Help needed turning from your back to your side while in a flat bed without using bedrails?: None ?Help needed moving from lying on your back to sitting on the side of a flat bed without using bedrails?: A Little ?Help needed moving to and from a bed to a chair (including a wheelchair)?: A Little ?Help needed standing up from a chair using your arms (e.g., wheelchair or bedside chair)?: A Little ?Help needed to walk in hospital room?: A Lot ?Help needed climbing 3-5 steps with a railing? : A Lot ?6  Click Score: 17 ? ?  ?End of Session Equipment Utilized During Treatment: Gait belt ?Activity Tolerance: Patient limited by lethargy ?Patient left: in chair;with call bell/phone within reach;with chair alarm set;with family/visitor present ?Nurse Communication: Mobility status;Other (comment) (pt lethargic) ?PT Visit Diagnosis: Unsteadiness on feet (R26.81) ?  ? ?Time: 4665-9935 ?PT Time Calculation (min) (ACUTE ONLY): 23 min ? ? ?Charges:   PT Evaluation ?$PT Eval Low Complexity: 1 Low ?PT Treatments ?$Therapeutic Activity: 8-22 mins ?  ?   ? ? ?Festus Barren., PT, DPT  ?Acute Rehabilitation Services  ?Office [phone] ? ?10/11/2021, 12:23 PM ? ?

## 2021-10-11 NOTE — Plan of Care (Signed)
°  Problem: Activity: °Goal: Ability to tolerate increased activity will improve °Outcome: Progressing °  °Problem: Respiratory: °Goal: Ability to maintain adequate ventilation will improve °Outcome: Progressing °Goal: Ability to maintain a clear airway will improve °Outcome: Progressing °  °

## 2021-10-12 DIAGNOSIS — E871 Hypo-osmolality and hyponatremia: Secondary | ICD-10-CM | POA: Diagnosis not present

## 2021-10-12 DIAGNOSIS — R778 Other specified abnormalities of plasma proteins: Secondary | ICD-10-CM | POA: Diagnosis not present

## 2021-10-12 DIAGNOSIS — I5032 Chronic diastolic (congestive) heart failure: Secondary | ICD-10-CM | POA: Diagnosis not present

## 2021-10-12 DIAGNOSIS — J189 Pneumonia, unspecified organism: Secondary | ICD-10-CM | POA: Diagnosis not present

## 2021-10-12 LAB — GLUCOSE, CAPILLARY
Glucose-Capillary: 124 mg/dL — ABNORMAL HIGH (ref 70–99)
Glucose-Capillary: 142 mg/dL — ABNORMAL HIGH (ref 70–99)
Glucose-Capillary: 172 mg/dL — ABNORMAL HIGH (ref 70–99)
Glucose-Capillary: 194 mg/dL — ABNORMAL HIGH (ref 70–99)
Glucose-Capillary: 226 mg/dL — ABNORMAL HIGH (ref 70–99)
Glucose-Capillary: 76 mg/dL (ref 70–99)

## 2021-10-12 LAB — BASIC METABOLIC PANEL
Anion gap: 6 (ref 5–15)
BUN: 34 mg/dL — ABNORMAL HIGH (ref 8–23)
CO2: 22 mmol/L (ref 22–32)
Calcium: 8.2 mg/dL — ABNORMAL LOW (ref 8.9–10.3)
Chloride: 97 mmol/L — ABNORMAL LOW (ref 98–111)
Creatinine, Ser: 1.3 mg/dL — ABNORMAL HIGH (ref 0.44–1.00)
GFR, Estimated: 41 mL/min — ABNORMAL LOW (ref >60)
Glucose, Bld: 128 mg/dL — ABNORMAL HIGH (ref 70–99)
Potassium: 4.1 mmol/L (ref 3.5–5.1)
Sodium: 125 mmol/L — ABNORMAL LOW (ref 135–145)

## 2021-10-12 LAB — SODIUM
Sodium: 124 mmol/L — ABNORMAL LOW (ref 135–145)
Sodium: 126 mmol/L — ABNORMAL LOW (ref 135–145)

## 2021-10-12 LAB — OSMOLALITY, URINE: Osmolality, Ur: 352 mOsm/kg (ref 300–900)

## 2021-10-12 NOTE — Progress Notes (Signed)
[facility] ?Progress Note ? ?Summary: 81 yo w/ CKD 3, pHTN, DM2, HTN, HL presented w/ SOB and cough found to have COVID+ infection w/ assoc bacterial pna. Na+ was low at 124 on admission and 126 on 3/22. She was started on Lasix and Na+ dropped to 120-121.  ? ?Subjective: Na+ up to 125 this am.  Pt seen in room.  Her back hurts, and she wants something to "help her relax". Have relayed to RN.  No other c/o's.   ? ? ? ?Vitals:  ? 10/11/21 1224 10/11/21 2058 10/11/21 2109 10/12/21 0515  ?BP: (!) 119/53 113/61  (!) 136/54  ?Pulse: 73 78  98  ?Resp: _0 ?Temp: (!) 97.5 ?F (36.4 ?C) 98.3 ?F (36.8 ?C)  98.4 ?F (36.9 ?C)  ?TempSrc: Oral Oral  Oral  ?SpO2: 98% 96% 98% 96%  ?Weight:      ?Height:      ? ? ?Exam: ?General: no acute distress, mild ^wob, mildly restless, looks a bit pale/ grey in the skin ?Lungs: scattered slight crackles L base > R base, on [place] ?Abd: soft, non-tender, non-distended ?Ext: no edema, poor skin turgor ?Neuro: awake, alert, hard of hearing ? ?     ?     Date  Creat  eGFR ?    2021  1.26- 1.44 ?    2022  1.19- 1.67 28- 43, CKD 3b ?    10/05/2021  1.39  ?    10/11/21  1.50 ? ?     3/21 - UNa 15, UOsm 411 ?    3/25- pending ?     UA 6-10 wbc, 0-5 rbc, 100 prot ?     CXR 3/21 - IMPRESSION: Mild patchy opacities at the lung bases most likely due to atelectasis. Pneumonia and pulmonary edema considered less likely. ?  ?      ? ? ?Assessment/ Plan: ?Hyponatremia - Na+ low at 124 on admission , 126 on 3/22. After lasix was started Na+ dropped to 120-121. Initial urine studies suggested hypovolemia. Lasix was held and IVF"s started at 65 cc/hr on 3/25. Na+ nadir'd at 121 and today is up to 125.  No signs of vol excess, will cont gentle IVF's at 65 cc/hr. F/u labs in am.  ?CKD 3b - b/l creat 1.2- 1.6 from 2022.  Creat stable here 1.3- 1.5. UA unremarkable.  ?AHRF / COVID 19 pna / CAP - on IV abx. CXR on admit w/o edema.  ?HTN - on coreg, norvasc ?Chronic dCHF - appears euvolemic on exam.  Lasix dc'd.  ?DM2 ?Anemia chronic disease ? ? ? ? ?Rob Tayen Narang ?10/12/2021, 6:05 AM ? ? ?Recent Labs  ?Lab 10/08/21 ?0933 10/08/21 ?1507 10/09/21 ?0423 10/09/21 ?1438 10/10/21 ?0409 10/10/21 ?6063 10/11/21 ?1532 10/12/21 ?0320  ?HGB  --   --  9.4*  --  9.2*  --   --   --   ?ALBUMIN 2.5* 2.5*  --   --   --   --   --   --   ?CALCIUM 8.2* 8.0* 7.9*   < >  --    < > 8.4* 8.2*  ?PHOS 2.4* 2.0*  --   --   --   --   --   --   ?CREATININE 1.28* 1.24* 1.23*   < >  --    < > 1.38* 1.30*  ?K 4.4 4.1 4.2   < >  --    < > 4.1 4.1  ? < > =  values in this interval not displayed.  ? ? ?Inpatient medications: ? amLODipine  10 mg Oral q morning  ? vitamin C  500 mg Oral Q lunch  ? azithromycin  500 mg Oral Daily  ? carvedilol  25 mg Oral BID WC  ? doxazosin  4 mg Oral QHS  ? fenofibrate  160 mg Oral q morning  ? ferrous sulfate  325 mg Oral Q lunch  ? insulin aspart  0-5 Units Subcutaneous QHS  ? insulin aspart  0-9 Units Subcutaneous TID WC  ? insulin detemir  5 Units Subcutaneous Daily  ? mouth rinse  15 mL Mouth Rinse BID  ? melatonin  3 mg Oral QHS  ? mometasone-formoterol  2 puff Inhalation BID  ? mupirocin ointment   Nasal BID  ? simvastatin  20 mg Oral QHS  ? sodium bicarbonate  650 mg Oral BID  ? zinc sulfate  220 mg Oral Daily  ? ? sodium chloride 65 mL/hr at 10/12/21 0243  ? cefTRIAXone (ROCEPHIN)  IV Stopped (10/11/21 1400)  ? ?albuterol, calcium carbonate, chlorpheniramine-HYDROcodone, diphenhydrAMINE, guaiFENesin-dextromethorphan, ipratropium-albuterol, ondansetron **OR** ondansetron (ZOFRAN) IV ? ? ? ? ? ? ?

## 2021-10-12 NOTE — Progress Notes (Addendum)
Physical Therapy Treatment ?Patient Details ?Name: Carla Foster ?MRN: [id number] ?DOB: [birth year] ?Today's Date: 10/12/2021 ? ? ?History of Present Illness Pt is an 81 y.o. female presenting with cough and dyspnea. Admitted for treatment of acute respiratory failure with hypoxia secondarily to COVID-19 pneumonia with superimposed community-acquired pneumonia, hypovolemic hyponatremia, and sepsis . PMH significant for essential hypertension, asthma, HLD, PAD, diabetes mellitus type 2, chronic kidney disease, history of cancer , and cervical discectomy (2002). ? ?  ?PT Comments  ? ? Pt is progressing toward acute PT goals this session with progression to ambulation. Pt ambulated ~77f with MIN guard and use of RW. Increased fatigue/SOB following ambulation, pt reports she is typically able to tolerate more activity at baseline. O2 sats >90% while on 6L [place], O2 low observed 92% during ambulation. Pt with recovery to 96% and weaned to 5L with O2 at 96% at rest at end of session.  Pt will benefit from continued skilled PT to increase her independence and maximize safety with mobility.  ?   ?Recommendations for follow up therapy are one component of a multi-disciplinary discharge planning process, led by the attending physician.  Recommendations may be updated based on patient status, additional functional criteria and insurance authorization. ? ?Follow Up Recommendations ? Home health PT ?  ?  ?Assistance Recommended at Discharge Frequent or constant Supervision/Assistance  ?Patient can return home with the following A little help with walking and/or transfers;A little help with bathing/dressing/bathroom;Help with stairs or ramp for entrance ?  ?Equipment Recommendations ? None recommended by PT (owns RW)  ?  ?Recommendations for Other Services   ? ? ?  ?Precautions / Restrictions Precautions ?Precautions: Fall ?Restrictions ?Weight Bearing Restrictions: No  ?  ? ?Mobility ? Bed Mobility ?  ?  ?  ?  ?  ?  ?  ?General bed  mobility comments: OOB in recliner upon entry ?  ? ?Transfers ?Overall transfer level: Needs assistance ?Equipment used: Rolling walker (2 wheels) ?Transfers: Sit to/from Stand ?Sit to Stand: Min guard ?  ?  ?  ?  ?  ?General transfer comment: MIN guard with cues for safe hand placement, increased time to scoot hips to edge of chair and rise to full stand. ?  ? ?Ambulation/Gait ?Ambulation/Gait assistance: Min guard ?Gait Distance (Feet): 90 Feet ?Assistive device: Rolling walker (2 wheels) ?Gait Pattern/deviations: Step-through pattern, Decreased stride length, Drifts right/left ?Gait velocity: decr ?  ?  ?General Gait Details: some instability observed with inital ambulation, improved with greater distance, cues to maintain close proximity to RW and for straight path, pt drifting L/R with RW. Verbal cues required for obstacle negotiation with use of RW. O2 >90% on 6L [place]. ? ? ?Stairs ?  ?  ?  ?  ?  ? ? ?Wheelchair Mobility ?  ? ?Modified Rankin (Stroke Patients Only) ?  ? ? ?  ?Balance Overall balance assessment: Needs assistance ?Sitting-balance support: Feet supported ?Sitting balance-Leahy Scale: Good ?  ?  ?Standing balance support: Bilateral upper extremity supported, During functional activity ?Standing balance-Leahy Scale: Fair ?  ?  ?  ?  ?  ?  ?  ?  ?  ?  ?  ?  ?  ? ?  ?Cognition Arousal/Alertness: Awake/alert ?[facility]/performed ?Overall Cognitive Status: Within Functional Limits for tasks assessed ?  ?  ?  ?  ?  ?  ?  ?  ?  ?  ?  ?  ?  ?  ?  ?  ?  ?  ?  ? ?  ?  Exercises   ? ?  ?General Comments   ?  ?  ? ?Pertinent Vitals/Pain Pain Assessment ?Pain Assessment: No/denies pain  ? ? ?Home Living   ?  ?  ?  ?  ?  ?  ?  ?  ?  ?   ?  ?Prior Function    ?  ?  ?   ? ?PT Goals (current goals can now be found in the care plan section) Acute Rehab PT Goals ?Patient Stated Goal: improve energy level ?PT Goal Formulation: With patient ?Time For Goal Achievement:  10/25/21 ?Potential to Achieve Goals: Good ?Progress towards PT goals: Progressing toward goals ? ?  ?Frequency ? ? ? Min 3X/week ? ? ? ?  ?PT Plan Current plan remains appropriate  ? ? ?Co-evaluation   ?  ?  ?  ?  ? ?  ?AM-PAC PT "6 Clicks" Mobility   ?Outcome Measure ? Help needed turning from your back to your side while in a flat bed without using bedrails?: None ?Help needed moving from lying on your back to sitting on the side of a flat bed without using bedrails?: A Little ?Help needed moving to and from a bed to a chair (including a wheelchair)?: A Little ?Help needed standing up from a chair using your arms (e.g., wheelchair or bedside chair)?: A Little ?Help needed to walk in hospital room?: A Little ?Help needed climbing 3-5 steps with a railing? : A Lot ?6 Click Score: 18 ? ?  ?End of Session Equipment Utilized During Treatment: Gait belt ?Activity Tolerance: Patient tolerated treatment well ?Patient left: in chair;with call bell/phone within reach;with chair alarm set;with family/visitor present ?Nurse Communication: Mobility status;Other (comment) (O2 sats) ?PT Visit Diagnosis: Unsteadiness on feet (R26.81) ?  ? ? ?Time: 1856-3149 ?PT Time Calculation (min) (ACUTE ONLY): 24 min ? ?Charges:  $Therapeutic Activity: 23-37 mins          ?          ?Festus Barren., PT, DPT  ?Acute Rehabilitation Services  ?Office [phone] ? ?10/12/2021, 7:02 PM ? ?

## 2021-10-12 NOTE — Progress Notes (Signed)
RT found patient on room air sats 80%. RT placed patient back on 6 lpm [place] sats 95%. Patient has poor effort on doing her [place] BID. Family at bedside.  ?

## 2021-10-12 NOTE — Progress Notes (Signed)
Patient found without oxygen on.  Lethargic but oriented.  O2 replaced at 5L and pulse ox 94%.   ?

## 2021-10-12 NOTE — Progress Notes (Signed)
[facility] ?PROGRESS NOTE ? ? ? ?Progress Note  ?Carla Foster  IBB:[id number] DOB: [birth year] DOA: 10/05/2021 ?PCP: Leamon Arnt, MD  ? ? ? ?Brief Narrative:  ? ?Carla Foster is an 81 y.o. female past medical history significant for essential hypertension diabetes mellitus type 2 chronic kidney disease stage IIIb history of cancer presents with cough and dyspnea that started 10 days prior to admission.  She has required more assistance going around the house not using her walker, eventually her cough became productive chest x-ray showed community-acquired pneumonia. ? ? ? ?Assessment/Plan:  ? ?Acute respiratory failure with hypoxia secondarily to COVID-19 pneumonia with superimposed community-acquired pneumonia: ?Outside the window for antiviral therapy. ?Afebrile continue Augmentin and doxycycline for 1 additional days. ?Out of bed to chair, continue encourage incentive spirometry. ?Still requiring 6 L of oxygen to keep saturations greater 92% try to wean to room air. ?Out of bed to chair, continues into spirometry and flutter valve. ?Awaiting PT OT evaluation.  She is getting close to discharge. ? ?Severe sepsis due to community-acquired pneumonia: ?She still requiring 6 L of high flow nasal cannula to keep saturations greater than 92%. ?Anything above 88% is good on her. ?Continue antibiotics for an additional day. ? ?Hypovolemic hyponatremia: ?Renal was consulted started on IV fluids her sodium is improving nicely. ?Recheck sodium tomorrow morning. ? ?Acute kidney injury on chronic kidney disease stage III: ?With a baseline creatinine around to the recommended to hold ARB and Lasix and recheck a basic metabolic panel. ? ?Elevated troponins: ?Elevation in troponin demand ischemia. ? ?Diabetes mellitus type 2 with hyperglycemia: ?An A1c of 7.4, had an episode of hypoglycemia, with meal coverage was held. ?Off insulin had another episode of hypoglycemia discontinue sliding scale insulin. ?Patient does  have poor oral intake. ? ?Acute kidney injury on chronic kidney disease stage IIIb: ?She was started on IV fluids likely prerenal azotemia her creatinine returned to baseline. ? ?Essential hypertension: ?Continue Norvasc and Coreg  ?Hold diuretic therapy. ? ?Chronic diastolic heart failure: ?Continue hold diuretic therapy and ARB ? ?Anemia of chronic renal disease: ?No signs of overt bleeding.  Hemoglobin is currently stable. ? ? ? ?DVT prophylaxis: lovenox ?Family Communication:none ?Status is: Inpatient ?Remains inpatient appropriate because: Sepsis due to community-acquired pneumonia ? ? ? ?Code Status:  ? ?  ?Code Status Orders  ?(From admission, onward)  ?  ? ? ?  ? ?  Start     Ordered  ? 10/16/2021 1311  Do not attempt resuscitation (DNR)  Continuous       ?Question Answer Comment  ?In the event of cardiac or respiratory ARREST Do not call a ?code blue?   ?In the event of cardiac or respiratory ARREST Do not perform Intubation, CPR, defibrillation or ACLS   ?In the event of cardiac or respiratory ARREST Use medication by any route, position, wound care, and other measures to relive pain and suffering. May use oxygen, suction and manual treatment of airway obstruction as needed for comfort.   ?Comments confirmed with pt/dtrs   ?  ? 09/28/2021 1310  ? ?  ?  ? ?  ? ?Code Status History   ? ? Date Active Date Inactive Code Status Order ID Comments User Context  ? 07/26/2018 0048 08/02/2018 1818 Full Code [id number]  Corey Harold, NP ED  ? 11/20/2017 1301 11/23/2017 1446 Full Code [id number]  Radene Gunning, NP ED  ? ?  ? ?Advance Directive Documentation   ? ?Flowsheet Row  Most Recent Value  ?Type of Advance Directive Healthcare Power of [place], Living will, Out of facility DNR (pink MOST or yellow form)  ?Pre-existing out of facility DNR order (yellow form or pink MOST form) Physician notified to receive inpatient order  ?"MOST" Form in Place? --  ? ?  ? ? ? ? ?IV Access:  ? ?Peripheral IV ? ? ?Procedures and diagnostic  studies:  ? ?No results found. ? ? ?Medical Consultants:  ? ?None. ? ? ?Subjective:  ? ? ?Carla Foster awake this morning relating she is tired. ? ?Objective:  ? ? ?Vitals:  ? 10/11/21 2058 10/11/21 2109 10/12/21 0500 10/12/21 0515  ?BP: 113/61   (!) 136/54  ?Pulse: 78   98  ?Resp: 18   16  ?Temp: 98.3 ?F (36.8 ?C)   98.4 ?F (36.9 ?C)  ?TempSrc: Oral   Oral  ?SpO2: 96% 98%  96%  ?Weight:   68 kg   ?Height:      ? ?SpO2: 96 % ?O2 Flow Rate (L/min): 6 L/min ? ? ?Intake/Output Summary (Last 24 hours) at 10/12/2021 0814 ?Last data filed at 10/12/2021 0600 ?Gross per 24 hour  ?Intake 1373.53 ml  ?Output 550 ml  ?Net 823.53 ml  ? ? ?Filed Weights  ? 10/10/21 0500 10/11/21 0445 10/12/21 0500  ?Weight: 67.2 kg 68.9 kg 68 kg  ? ? ?Exam: ?General exam: In no acute distress. ?Respiratory system: Good air movement and clear to auscultation. ?Cardiovascular system: S1 & S2 heard, RRR. No JVD. ?Gastrointestinal system: Abdomen is nondistended, soft and nontender.  ?Extremities: No pedal edema. ?Skin: No rashes, lesions or ulcers ? ? ?Data Reviewed:  ? ? ?Labs: ?Basic Metabolic Panel: ?Recent Labs  ?Lab 10/12/2021 ?1604 10/15/2021 ?2212 10/08/21 ?4098 10/08/21 ?0933 10/08/21 ?1507 10/09/21 ?0423 10/09/21 ?1438 10/10/21 ?0409 10/10/21 ?1191 10/10/21 ?1538 10/10/21 ?2005 10/11/21 ?0250 10/11/21 ?1153 10/11/21 ?1532 10/11/21 ?2321 10/12/21 ?0320  ?NA 126* 123* 124* 125* 126* 120*   < >  --  121* 120*   < > 121* 122* 123* 124* 125*  ?K 4.8 4.2 4.2 4.4 4.1 4.2   < >  --  3.8 4.1  --  4.0  --  4.1  --  4.1  ?CL 98 97* 97* 95* 99 95*   < >  --  95* 94*  --  93*  --  95*  --  97*  ?CO2 20* 18* 14* 20* 20* 17*   < >  --  19* 18*  --  19*  --  22  --  22  ?GLUCOSE 225* 97 157* 176* 75 159*   < >  --  85 156*  --  117*  --  60*  --  128*  ?BUN 45* 41* 40* 41* 41* 38*   < >  --  40* 39*  --  38*  --  39*  --  34*  ?CREATININE 1.21* 1.15* 1.17* 1.28* 1.24* 1.23*   < >  --  1.33* 1.44*  --  1.50*  --  1.38*  --  1.30*  ?CALCIUM 8.1* 7.9* 8.2* 8.2*  8.0* 7.9*   < >  --  8.0* 8.0*  --  8.1*  --  8.4*  --  8.2*  ?MG  --   --  2.1  --   --  1.9  --  1.8  --   --   --   --   --   --   --   --   ?  PHOS 2.5 2.1* 2.2* 2.4* 2.0*  --   --   --   --   --   --   --   --   --   --   --   ? < > = values in this interval not displayed.  ? ? ?GFR ?Estimated Creatinine Clearance: 31.2 mL/min (A) (by C-G formula based on SCr of 1.3 mg/dL (H)). ?Liver Function Tests: ?Recent Labs  ?Lab 10/10/2021 ?0740 09/26/2021 ?1604 10/14/2021 ?2212 10/08/21 ?7026 10/08/21 ?0933 10/08/21 ?1507  ?AST 45*  --   --   --   --   --   ?ALT 35  --   --   --   --   --   ?ALKPHOS 40  --   --   --   --   --   ?BILITOT 0.6  --   --   --   --   --   ?PROT 7.1  --   --   --   --   --   ?ALBUMIN 3.2* 2.9* 2.8* 2.7* 2.5* 2.5*  ? ? ?Recent Labs  ?Lab 10/02/2021 ?0740  ?LIPASE 38  ? ? ?No results for input(s): AMMONIA in the last 168 hours. ?Coagulation profile ?No results for input(s): INR, PROTIME in the last 168 hours. ?COVID-19 Labs ? ?Recent Labs  ?  10/10/21 ?0409  ?DDIMER 4.12*  ?FERRITIN 232  ?CRP 10.5*  ? ? ? ?Lab Results  ?Component Value Date  ? SARSCOV2NAA POSITIVE (A) 10/11/2021  ? [place] NEGATIVE 07/06/2019  ? ? ?CBC: ?Recent Labs  ?Lab 09/24/2021 ?0740 10/08/21 ?3785 10/09/21 ?0423 10/10/21 ?0409  ?WBC 12.6* 12.6* 12.3* 12.2*  ?NEUTROABS 11.4* 11.2* 11.0* 10.9*  ?HGB 10.9* 10.0* 9.4* 9.2*  ?HCT 33.4* 31.3* 29.1* 27.5*  ?MCV 86.3 89.2 87.9 85.7  ?PLT 289 299 318 307  ? ? ?Cardiac Enzymes: ?No results for input(s): CKTOTAL, CKMB, CKMBINDEX, TROPONINI in the last 168 hours. ?BNP (last 3 results) ?No results for input(s): PROBNP in the last 8760 hours. ?CBG: ?Recent Labs  ?Lab 10/11/21 ?1701 10/11/21 ?2100 10/12/21 ?0032 10/12/21 ?8850 10/12/21 ?0744  ?GLUCAP 170* 84 76 142* 124*  ? ? ?D-Dimer: ?Recent Labs  ?  10/10/21 ?0409  ?DDIMER 4.12*  ? ? ?Hgb A1c: ?No results for input(s): HGBA1C in the last 72 hours. ? ?Lipid Profile: ?No results for input(s): CHOL, HDL, LDLCALC, TRIG, CHOLHDL, LDLDIRECT in the  last 72 hours. ?Thyroid function studies: ?No results for input(s): TSH, T4TOTAL, T3FREE, THYROIDAB in the last 72 hours. ? ?Invalid input(s): FREET3 ?Anemia work up: ?Recent Labs  ?  10/10/21 ?0409  ?FE

## 2021-10-13 DIAGNOSIS — U071 COVID-19: Secondary | ICD-10-CM | POA: Diagnosis not present

## 2021-10-13 DIAGNOSIS — J189 Pneumonia, unspecified organism: Secondary | ICD-10-CM | POA: Diagnosis not present

## 2021-10-13 DIAGNOSIS — D638 Anemia in other chronic diseases classified elsewhere: Secondary | ICD-10-CM | POA: Diagnosis not present

## 2021-10-13 DIAGNOSIS — I10 Essential (primary) hypertension: Secondary | ICD-10-CM

## 2021-10-13 DIAGNOSIS — E871 Hypo-osmolality and hyponatremia: Secondary | ICD-10-CM | POA: Diagnosis not present

## 2021-10-13 LAB — BASIC METABOLIC PANEL
Anion gap: 6 (ref 5–15)
BUN: 33 mg/dL — ABNORMAL HIGH (ref 8–23)
CO2: 20 mmol/L — ABNORMAL LOW (ref 22–32)
Calcium: 8.4 mg/dL — ABNORMAL LOW (ref 8.9–10.3)
Chloride: 102 mmol/L (ref 98–111)
Creatinine, Ser: 1.09 mg/dL — ABNORMAL HIGH (ref 0.44–1.00)
GFR, Estimated: 51 mL/min — ABNORMAL LOW (ref >60)
Glucose, Bld: 283 mg/dL — ABNORMAL HIGH (ref 70–99)
Potassium: 4.2 mmol/L (ref 3.5–5.1)
Sodium: 128 mmol/L — ABNORMAL LOW (ref 135–145)

## 2021-10-13 LAB — GLUCOSE, CAPILLARY
Glucose-Capillary: 249 mg/dL — ABNORMAL HIGH (ref 70–99)
Glucose-Capillary: 256 mg/dL — ABNORMAL HIGH (ref 70–99)
Glucose-Capillary: 75 mg/dL (ref 70–99)
Glucose-Capillary: 72 mg/dL (ref 70–99)

## 2021-10-13 MED ORDER — INSULIN ASPART 100 UNIT/ML IJ SOLN
4.0000 [IU] | Freq: Three times a day (TID) | INTRAMUSCULAR | Status: DC
  Administered 2021-10-13: 4 [IU] via SUBCUTANEOUS

## 2021-10-13 MED ORDER — INSULIN ASPART 100 UNIT/ML IJ SOLN
0.0000 [IU] | Freq: Three times a day (TID) | INTRAMUSCULAR | Status: DC
  Administered 2021-10-13: 5 [IU] via SUBCUTANEOUS
  Administered 2021-10-14: 2 [IU] via SUBCUTANEOUS
  Administered 2021-10-14: 1 [IU] via SUBCUTANEOUS
  Administered 2021-10-15: 2 [IU] via SUBCUTANEOUS

## 2021-10-13 MED ORDER — INSULIN ASPART 100 UNIT/ML IJ SOLN: 0.0000 [IU] | Freq: Every day | INTRAMUSCULAR | Status: DC

## 2021-10-13 MED ORDER — IPRATROPIUM-ALBUTEROL 20-100 MCG/ACT IN AERS
1.0000 | INHALATION_SPRAY | Freq: Three times a day (TID) | RESPIRATORY_TRACT | Status: DC
  Administered 2021-10-14: 1 via RESPIRATORY_TRACT
  Filled 2021-10-13: qty 4

## 2021-10-13 MED ORDER — INSULIN DETEMIR 100 UNIT/ML ~~LOC~~ SOLN
15.0000 [IU] | Freq: Two times a day (BID) | SUBCUTANEOUS | Status: DC
  Filled 2021-10-13 (×4): qty 0.15

## 2021-10-13 MED ORDER — INSULIN DETEMIR 100 UNIT/ML ~~LOC~~ SOLN
5.0000 [IU] | Freq: Two times a day (BID) | SUBCUTANEOUS | Status: DC
  Administered 2021-10-13: 5 [IU] via SUBCUTANEOUS
  Filled 2021-10-13 (×2): qty 0.05

## 2021-10-13 NOTE — Progress Notes (Signed)
[facility] ?Progress Note ? ?Summary: 81 yo w/ CKD 3, pHTN, DM2, HTN, HL presented w/ SOB and cough found to have COVID+ infection w/ assoc bacterial pna. Na+ was low at 124 on admission and 126 on 3/22. She was started on Lasix and Na+ dropped to 120-121.  ? ?Subjective: Na+ up to 128 this am.  Pt seen in room.  More somnolent. Family at bedside. S Cr down to 1.09. ? ? ?Vitals:  ? 10/13/21 0348 10/13/21 0349 10/13/21 0816 10/13/21 1201  ?BP:  (!) 135/57 (!) 117/100 (!) 121/59  ?Pulse:  97 93 92  ?Resp:  16 20 20   ?Temp:  98.9 ?F (37.2 ?C)  97.9 ?F (36.6 ?C)  ?TempSrc:  Oral  Oral  ?SpO2:  91% 95% 94%  ?Weight: 72.6 kg     ?Height:      ? ? ?Exam: ?General: no acute distress, sleeping ?Lungs: scattered slight crackles L base > R base, on [place] ?Abd: soft, non-tender, non-distended ?Ext: no edema, poor skin turgor ?Neuro: awake, alert, hard of hearing ? ?     ?     Date  Creat  eGFR ?    2021  1.26- 1.44 ?    2022  1.19- 1.67 28- 43, CKD 3b ?    10/06/2021  1.39  ?    10/11/21  1.50 ? ?     3/21 - UNa 15, UOsm 411 ?    3/25- pending ?     UA 6-10 wbc, 0-5 rbc, 100 prot ?     CXR 3/21 - IMPRESSION: Mild patchy opacities at the lung bases most likely due to atelectasis. Pneumonia and pulmonary edema considered less likely. ?  ?      ? ? ?Assessment/ Plan: ?Hyponatremia - Has had historical problems with this.  Na+ low at 124 on admission , 126 on 3/22. After lasix was started Na+ dropped to 120-121. Initial urine studies suggested hypovolemia. Lasix was held and IVF"s started at 65 cc/hr on 3/25. Na+ nadir'd at 121 and today is up to 128.  No signs of vol excess, will cont gentle IVF's at 65 cc/hr until able to tolerate oral food/liquid conssitently.  Should cont to correct ?CKD 3b - b/l creat 1.2- 1.6 from 2022.  Creat stable here 1.3- 1.5, infact a bit better than usual.. UA unremarkable.  ?AHRF / COVID 19 pna / CAP - on IV abx. CXR on admit w/o edema.  ?HTN - on coreg, norvasc ?Chronic dCHF - appears  euvolemic on exam. Lasix dc'd.  ?DM2 ?Anemia chronic disease ? ?Will sign off for now.  Please call with any questions or concerns.  Pt does note need follow up with nephrology upon discharge.   ? ?Rexene Agent, MD  ?10/13/2021, 3:53 PM ? ? ?Recent Labs  ?Lab 10/08/21 ?0933 10/08/21 ?1507 10/09/21 ?0423 10/09/21 ?1438 10/10/21 ?0409 10/10/21 ?1941 10/12/21 ?0320 10/13/21 ?7408  ?HGB  --   --  9.4*  --  9.2*  --   --   --   ?ALBUMIN 2.5* 2.5*  --   --   --   --   --   --   ?CALCIUM 8.2* 8.0* 7.9*   < >  --    < > 8.2* 8.4*  ?PHOS 2.4* 2.0*  --   --   --   --   --   --   ?CREATININE 1.28* 1.24* 1.23*   < >  --    < >  1.30* 1.09*  ?K 4.4 4.1 4.2   < >  --    < > 4.1 4.2  ? < > = values in this interval not displayed.  ? ? ?Inpatient medications: ? amLODipine  10 mg Oral q morning  ? vitamin C  500 mg Oral Q lunch  ? carvedilol  25 mg Oral BID WC  ? doxazosin  4 mg Oral QHS  ? fenofibrate  160 mg Oral q morning  ? ferrous sulfate  325 mg Oral Q lunch  ? insulin aspart  0-5 Units Subcutaneous QHS  ? insulin aspart  0-9 Units Subcutaneous TID WC  ? insulin aspart  4 Units Subcutaneous TID WC  ? insulin detemir  15 Units Subcutaneous BID  ? mouth rinse  15 mL Mouth Rinse BID  ? melatonin  3 mg Oral QHS  ? mometasone-formoterol  2 puff Inhalation BID  ? mupirocin ointment   Nasal BID  ? simvastatin  20 mg Oral QHS  ? sodium bicarbonate  650 mg Oral BID  ? zinc sulfate  220 mg Oral Daily  ? ? sodium chloride 65 mL/hr at 10/12/21 0243  ? ?albuterol, calcium carbonate, diphenhydrAMINE, guaiFENesin-dextromethorphan, ipratropium-albuterol, ondansetron **OR** ondansetron (ZOFRAN) IV ? ? ? ? ? ? ?

## 2021-10-13 NOTE — Progress Notes (Signed)
[facility] ?PROGRESS NOTE ? ? ? ?Progress Note  ?Carla Foster  CWC:[id number] DOB: November 10, 1940 DOA: 09/28/2021 ?PCP: Leamon Arnt, MD  ? ? ? ?Brief Narrative:  ? ?Carla Foster is an 81 y.o. female past medical history significant for essential hypertension diabetes mellitus type 2 chronic kidney disease stage IIIb history of cancer presents with cough and dyspnea that started 10 days prior to admission.  She has required more assistance going around the house not using her walker, eventually her cough became productive chest x-ray showed community-acquired pneumonia. ? ? ? ?Assessment/Plan:  ? ?Acute respiratory failure with hypoxia secondarily to COVID-19 pneumonia with superimposed community-acquired pneumonia: ?Outside the window for antiviral therapy. ?Has remained afebrile, she completed her course of antibiotics in house. ?Out of bed to chair, continue encourage incentive spirometry. ?Still requiring 5 L of oxygen to keep saturations greater than 92%. ?Physical therapy evaluated the patient, she will go home with home health. ? ?Severe sepsis due to community-acquired pneumonia: ?Now requiring only 5 L of oxygen. ?Anything above 88% on her is good. ? ?Hypovolemic hyponatremia: ?Renal was consulted she was started on IV fluids her sodium this morning is 128. ? ?Acute kidney injury on chronic kidney disease stage III: ?With a baseline creatinine of around 1, she was started on IV fluids her creatinine returned to baseline. ?Holding ARB. ? ?Elevated troponins: ?Elevation in troponin demand ischemia. ? ?Diabetes mellitus type 2 with hyperglycemia: ?An A1c of 7.4, had an episode of hypoglycemia, with meal coverage was held. ?Now tolerating her diet her blood glucose going up start long-acting insulin resume sliding scale. ? ?Essential hypertension: ?Continue Norvasc and Coreg  ?Hold diuretic therapy. ? ?Chronic diastolic heart failure: ?Continue hold diuretic therapy and ARB ? ?Anemia of chronic renal  disease: ?No signs of overt bleeding.  Hemoglobin is currently stable. ? ? ? ?DVT prophylaxis: lovenox ?Family Communication:none ?Status is: Inpatient ?Remains inpatient appropriate because: Sepsis due to community-acquired pneumonia ? ? ? ?Code Status:  ? ?  ?Code Status Orders  ?(From admission, onward)  ?  ? ? ?  ? ?  Start     Ordered  ? 10/02/2021 1311  Do not attempt resuscitation (DNR)  Continuous       ?Question Answer Comment  ?In the event of cardiac or respiratory ARREST Do not call a ?code blue?   ?In the event of cardiac or respiratory ARREST Do not perform Intubation, CPR, defibrillation or ACLS   ?In the event of cardiac or respiratory ARREST Use medication by any route, position, wound care, and other measures to relive pain and suffering. May use oxygen, suction and manual treatment of airway obstruction as needed for comfort.   ?Comments confirmed with pt/dtrs   ?  ? 10/16/2021 1310  ? ?  ?  ? ?  ? ?Code Status History   ? ? Date Active Date Inactive Code Status Order ID Comments User Context  ? 07/26/2018 0048 08/02/2018 1818 Full Code [id number]  Corey Harold, NP ED  ? 11/20/2017 1301 11/23/2017 1446 Full Code [id number]  Radene Gunning, NP ED  ? ?  ? ?Advance Directive Documentation   ? ?Flowsheet Row Most Recent Value  ?Type of Advance Directive Healthcare Power of [place], Living will, Out of facility DNR (pink MOST or yellow form)  ?Pre-existing out of facility DNR order (yellow form or pink MOST form) Physician notified to receive inpatient order  ?"MOST" Form in Place? --  ? ?  ? ? ? ? ?  IV Access:  ? ?Peripheral IV ? ? ?Procedures and diagnostic studies:  ? ?No results found. ? ? ?Medical Consultants:  ? ?None. ? ? ?Subjective:  ? ? ?Carla Foster sleepy this morning relates she is tired. ? ?Objective:  ? ? ?Vitals:  ? 10/12/21 2011 10/12/21 2100 10/13/21 0348 10/13/21 0349  ?BP:    (!) 135/57  ?Pulse:  88  97  ?Resp:    16  ?Temp:    98.9 ?F (37.2 ?C)  ?TempSrc:    Oral  ?SpO2: 96% 94%  91%   ?Weight:   72.6 kg   ?Height:      ? ?SpO2: 91 % ?O2 Flow Rate (L/min): 5 L/min ? ? ?Intake/Output Summary (Last 24 hours) at 10/13/2021 0733 ?Last data filed at 10/13/2021 0122 ?Gross per 24 hour  ?Intake 1558.33 ml  ?Output 450 ml  ?Net 1108.33 ml  ? ? ?Filed Weights  ? 10/11/21 0445 10/12/21 0500 10/13/21 0348  ?Weight: 68.9 kg 68 kg 72.6 kg  ? ? ?Exam: ?General exam: In no acute distress. ?Respiratory system: Good air movement and clear to auscultation. ?Cardiovascular system: S1 & S2 heard, RRR. No JVD. ?Gastrointestinal system: Abdomen is nondistended, soft and nontender.  ?Extremities: No pedal edema. ?Skin: No rashes, lesions or ulcers ?Psychiatry: Judgement and insight appear normal. Mood & affect appropriate. ? ? ?Data Reviewed:  ? ? ?Labs: ?Basic Metabolic Panel: ?Recent Labs  ?Lab 10/06/2021 ?1604 10/11/2021 ?2212 10/08/21 ?3295 10/08/21 ?0933 10/08/21 ?1507 10/09/21 ?0423 10/09/21 ?1438 10/10/21 ?0409 10/10/21 ?1884 10/10/21 ?1538 10/10/21 ?2005 10/11/21 ?0250 10/11/21 ?1153 10/11/21 ?1532 10/11/21 ?2321 10/12/21 ?0320 10/12/21 ?1660 10/13/21 ?0429  ?NA 126* 123* 124* 125* 126* 120*   < >  --    < > 120*   < > 121*   < > 123* 124* 125* 126* 128*  ?K 4.8 4.2 4.2 4.4 4.1 4.2   < >  --    < > 4.1  --  4.0  --  4.1  --  4.1  --  4.2  ?CL 98 97* 97* 95* 99 95*   < >  --    < > 94*  --  93*  --  95*  --  97*  --  102  ?CO2 20* 18* 14* 20* 20* 17*   < >  --    < > 18*  --  19*  --  22  --  22  --  20*  ?GLUCOSE 225* 97 157* 176* 75 159*   < >  --    < > 156*  --  117*  --  60*  --  128*  --  283*  ?BUN 45* 41* 40* 41* 41* 38*   < >  --    < > 39*  --  38*  --  39*  --  34*  --  33*  ?CREATININE 1.21* 1.15* 1.17* 1.28* 1.24* 1.23*   < >  --    < > 1.44*  --  1.50*  --  1.38*  --  1.30*  --  1.09*  ?CALCIUM 8.1* 7.9* 8.2* 8.2* 8.0* 7.9*   < >  --    < > 8.0*  --  8.1*  --  8.4*  --  8.2*  --  8.4*  ?MG  --   --  2.1  --   --  1.9  --  1.8  --   --   --   --   --   --   --   --   --   --   ?  PHOS 2.5 2.1* 2.2* 2.4*  2.0*  --   --   --   --   --   --   --   --   --   --   --   --   --   ? < > = values in this interval not displayed.  ? ? ?GFR ?Estimated Creatinine Clearance: 40.6 mL/min (A) (by C-G formula based on SCr of 1.09 mg/dL (H)). ?Liver Function Tests: ?Recent Labs  ?Lab 10/03/2021 ?0740 10/10/2021 ?1604 10/09/2021 ?2212 10/08/21 ?0093 10/08/21 ?0933 10/08/21 ?1507  ?AST 45*  --   --   --   --   --   ?ALT 35  --   --   --   --   --   ?ALKPHOS 40  --   --   --   --   --   ?BILITOT 0.6  --   --   --   --   --   ?PROT 7.1  --   --   --   --   --   ?ALBUMIN 3.2* 2.9* 2.8* 2.7* 2.5* 2.5*  ? ? ?Recent Labs  ?Lab 09/29/2021 ?0740  ?LIPASE 38  ? ? ?No results for input(s): AMMONIA in the last 168 hours. ?Coagulation profile ?No results for input(s): INR, PROTIME in the last 168 hours. ?COVID-19 Labs ? ?No results for input(s): DDIMER, FERRITIN, LDH, CRP in the last 72 hours. ? ? ?Lab Results  ?Component Value Date  ? SARSCOV2NAA POSITIVE (A) 09/18/2021  ? [place] NEGATIVE 07/06/2019  ? ? ?CBC: ?Recent Labs  ?Lab 10/08/2021 ?0740 10/08/21 ?8182 10/09/21 ?0423 10/10/21 ?0409  ?WBC 12.6* 12.6* 12.3* 12.2*  ?NEUTROABS 11.4* 11.2* 11.0* 10.9*  ?HGB 10.9* 10.0* 9.4* 9.2*  ?HCT 33.4* 31.3* 29.1* 27.5*  ?MCV 86.3 89.2 87.9 85.7  ?PLT 289 299 318 307  ? ? ?Cardiac Enzymes: ?No results for input(s): CKTOTAL, CKMB, CKMBINDEX, TROPONINI in the last 168 hours. ?BNP (last 3 results) ?No results for input(s): PROBNP in the last 8760 hours. ?CBG: ?Recent Labs  ?Lab 10/12/21 ?9937 10/12/21 ?0744 10/12/21 ?1123 10/12/21 ?1617 10/12/21 ?2054  ?GLUCAP 142* 124* 172* 226* 194*  ? ? ?D-Dimer: ?No results for input(s): DDIMER in the last 72 hours. ? ?Hgb A1c: ?No results for input(s): HGBA1C in the last 72 hours. ? ?Lipid Profile: ?No results for input(s): CHOL, HDL, LDLCALC, TRIG, CHOLHDL, LDLDIRECT in the last 72 hours. ?Thyroid function studies: ?No results for input(s): TSH, T4TOTAL, T3FREE, THYROIDAB in the last 72 hours. ? ?Invalid input(s):  FREET3 ?Anemia work up: ?No results for input(s): VITAMINB12, FOLATE, FERRITIN, TIBC, IRON, RETICCTPCT in the last 72 hours. ? ?Sepsis Labs: ?Recent Labs  ?Lab 10/13/2021 ?0740 09/29/2021 ?1696 10/08/21 ?7893 10/09/21 ?8101

## 2021-10-13 NOTE — Care Management Important Message (Signed)
Important Message ? ?Patient Details IM Letter placed in Patients room. ?Name: Carla Foster ?MRN: [id number] ?Date of Birth: 16-Dec-1940 ? ? ?Medicare Important Message Given:  Yes ? ? ? ? ?Kerin Salen ?10/13/2021, 2:32 PM ?

## 2021-10-13 NOTE — Progress Notes (Signed)
This am patient was on oxygen at 5 liters high flow nasal canula. O2 weaned to 4 liters, O2 saturation sustained 94%. Pt was reassessed x 2 hours later and O2 sat decreased 87%, O2 increased back to 5 lt, sat now 95%. ?

## 2021-10-14 ENCOUNTER — Inpatient Hospital Stay (HOSPITAL_COMMUNITY): Payer: Medicare Other

## 2021-10-14 DIAGNOSIS — D638 Anemia in other chronic diseases classified elsewhere: Secondary | ICD-10-CM | POA: Diagnosis not present

## 2021-10-14 DIAGNOSIS — I5032 Chronic diastolic (congestive) heart failure: Secondary | ICD-10-CM | POA: Diagnosis not present

## 2021-10-14 DIAGNOSIS — J189 Pneumonia, unspecified organism: Secondary | ICD-10-CM | POA: Diagnosis not present

## 2021-10-14 DIAGNOSIS — E871 Hypo-osmolality and hyponatremia: Secondary | ICD-10-CM | POA: Diagnosis not present

## 2021-10-14 LAB — CBC WITH DIFFERENTIAL/PLATELET
Abs Immature Granulocytes: 0.1 10*3/uL — ABNORMAL HIGH (ref 0.00–0.07)
Basophils Absolute: 0 10*3/uL (ref 0.0–0.1)
Basophils Relative: 0 %
Eosinophils Absolute: 0 10*3/uL (ref 0.0–0.5)
Eosinophils Relative: 0 %
HCT: 20.6 % — ABNORMAL LOW (ref 36.0–46.0)
Hemoglobin: 6.6 g/dL — CL (ref 12.0–15.0)
Immature Granulocytes: 1 %
Lymphocytes Relative: 1 %
Lymphs Abs: 0.2 10*3/uL — ABNORMAL LOW (ref 0.7–4.0)
MCH: 29.3 pg (ref 26.0–34.0)
MCHC: 32 g/dL (ref 30.0–36.0)
MCV: 91.6 fL (ref 80.0–100.0)
Monocytes Absolute: 0.3 10*3/uL (ref 0.1–1.0)
Monocytes Relative: 3 %
Neutro Abs: 10.9 10*3/uL — ABNORMAL HIGH (ref 1.7–7.7)
Neutrophils Relative %: 95 %
Platelets: 271 10*3/uL (ref 150–400)
RBC: 2.25 MIL/uL — ABNORMAL LOW (ref 3.87–5.11)
RDW: 14.3 % (ref 11.5–15.5)
WBC: 11.5 10*3/uL — ABNORMAL HIGH (ref 4.0–10.5)
nRBC: 0 % (ref 0.0–0.2)

## 2021-10-14 LAB — BASIC METABOLIC PANEL
Anion gap: 6 (ref 5–15)
BUN: 33 mg/dL — ABNORMAL HIGH (ref 8–23)
CO2: 21 mmol/L — ABNORMAL LOW (ref 22–32)
Calcium: 8.9 mg/dL (ref 8.9–10.3)
Chloride: 103 mmol/L (ref 98–111)
Creatinine, Ser: 1.15 mg/dL — ABNORMAL HIGH (ref 0.44–1.00)
GFR, Estimated: 48 mL/min — ABNORMAL LOW (ref >60)
Glucose, Bld: 138 mg/dL — ABNORMAL HIGH (ref 70–99)
Potassium: 5.1 mmol/L (ref 3.5–5.1)
Sodium: 130 mmol/L — ABNORMAL LOW (ref 135–145)

## 2021-10-14 LAB — BLOOD GAS, ARTERIAL
Acid-base deficit: 9.8 mmol/L — ABNORMAL HIGH (ref 0.0–2.0)
Bicarbonate: 18.7 mmol/L — ABNORMAL LOW (ref 20.0–28.0)
Drawn by: 22052
FIO2: 32 %
O2 Saturation: 98 %
Patient temperature: 37
pCO2 arterial: 55 mmHg — ABNORMAL HIGH (ref 32–48)
pH, Arterial: 7.14 — CL (ref 7.35–7.45)
pO2, Arterial: 95 mmHg (ref 83–108)

## 2021-10-14 LAB — VITAMIN B12: Vitamin B-12: 1070 pg/mL — ABNORMAL HIGH (ref 180–914)

## 2021-10-14 LAB — FERRITIN: Ferritin: 407 ng/mL — ABNORMAL HIGH (ref 11–307)

## 2021-10-14 LAB — PREPARE RBC (CROSSMATCH)

## 2021-10-14 LAB — LACTIC ACID, PLASMA
Lactic Acid, Venous: 0.4 mmol/L — ABNORMAL LOW (ref 0.5–1.9)
Lactic Acid, Venous: 0.5 mmol/L (ref 0.5–1.9)

## 2021-10-14 LAB — RETICULOCYTES
Immature Retic Fract: 14.9 % (ref 2.3–15.9)
RBC.: 2.6 MIL/uL — ABNORMAL LOW (ref 3.87–5.11)
Retic Count, Absolute: 29.1 10*3/uL (ref 19.0–186.0)
Retic Ct Pct: 1.1 % (ref 0.4–3.1)

## 2021-10-14 LAB — IRON AND TIBC
Iron: 11 ug/dL — ABNORMAL LOW (ref 28–170)
Saturation Ratios: 5 % — ABNORMAL LOW (ref 10.4–31.8)
TIBC: 220 ug/dL — ABNORMAL LOW (ref 250–450)
UIBC: 209 ug/dL

## 2021-10-14 LAB — GLUCOSE, CAPILLARY
Glucose-Capillary: 142 mg/dL — ABNORMAL HIGH (ref 70–99)
Glucose-Capillary: 142 mg/dL — ABNORMAL HIGH (ref 70–99)
Glucose-Capillary: 149 mg/dL — ABNORMAL HIGH (ref 70–99)
Glucose-Capillary: 150 mg/dL — ABNORMAL HIGH (ref 70–99)
Glucose-Capillary: 152 mg/dL — ABNORMAL HIGH (ref 70–99)
Glucose-Capillary: 158 mg/dL — ABNORMAL HIGH (ref 70–99)

## 2021-10-14 LAB — AMMONIA: Ammonia: 37 umol/L — ABNORMAL HIGH (ref 9–35)

## 2021-10-14 LAB — FOLATE: Folate: 10.5 ng/mL (ref >5.9)

## 2021-10-14 MED ORDER — SODIUM CHLORIDE 0.9 % IV SOLN
3.0000 g | Freq: Four times a day (QID) | INTRAVENOUS | Status: DC
  Administered 2021-10-14: 3 g via INTRAVENOUS
  Filled 2021-10-14 (×2): qty 8

## 2021-10-14 MED ORDER — FUROSEMIDE 10 MG/ML IJ SOLN
40.0000 mg | Freq: Once | INTRAMUSCULAR | Status: AC
  Administered 2021-10-14: 40 mg via INTRAVENOUS
  Filled 2021-10-14: qty 4

## 2021-10-14 MED ORDER — SODIUM CHLORIDE 0.9% IV SOLUTION: Freq: Once | INTRAVENOUS | Status: DC

## 2021-10-14 MED ORDER — SODIUM CHLORIDE 0.9 % IV SOLN: INTRAVENOUS | Status: DC

## 2021-10-14 NOTE — Progress Notes (Signed)
Physical Therapy Treatment ?Patient Details ?Name: Carla Foster ?MRN: [id number] ?DOB: 28-Mar-1941 ?Today's Date: 10/14/2021 ? ? ?History of Present Illness Pt is an 81 y.o. female presenting with cough and dyspnea. Admitted for treatment of acute respiratory failure with hypoxia secondarily to COVID-19 pneumonia with superimposed community-acquired pneumonia, hypovolemic hyponatremia, and sepsis . PMH significant for essential hypertension, asthma, HLD, PAD, diabetes mellitus type 2, chronic kidney disease, history of cancer , and cervical discectomy (2002). ? ?  ?PT Comments  ? ? SIGNIFICANT DECLINE ?General Comments: pt lethargic with trace alertness even with sternal rub.  Eyes gazed.  Mouth breathing with audible upper airway gurggling.  RN called to room.General bed mobility comments: Total Assist + 2 to sit pt upright EOB to attempt increased alertness.  Unsuccessful.  Daughter present and also attempting to get her mom to respond.  Pt on 3 lts nasal at 96% and HR 74.  Remained lethargic "rag doll". General transfer comment: to attempt increased alertness and improve airway, "Bear Hug" pivoted pt to recliner Total Assist pt 0%.  RN present during session.General Gait Details: unable to due to lethargy.  Pt amb 90 feet 3 days ago. ?Sent a secure chat to MD to revisit  ?Recommendations for follow up therapy are one component of a multi-disciplinary discharge planning process, led by the attending physician.  Recommendations may be updated based on patient status, additional functional criteria and insurance authorization. ? ?Follow Up Recommendations ? Home health PT ?  ?  ?Assistance Recommended at Discharge Frequent or constant Supervision/Assistance  ?Patient can return home with the following A little help with walking and/or transfers;A little help with bathing/dressing/bathroom;Help with stairs or ramp for entrance ?  ?Equipment Recommendations ? None recommended by PT  ?  ?Recommendations for Other  Services   ? ? ?  ?Precautions / Restrictions Precautions ?Precautions: Fall ?Restrictions ?Weight Bearing Restrictions: No  ?  ? ?Mobility ? Bed Mobility ?Overal bed mobility: Needs Assistance ?Bed Mobility: Supine to Sit ?  ?  ?Supine to sit: Total assist, +2 for physical assistance, +2 for safety/equipment ?  ?  ?General bed mobility comments: Total Assist + 2 to sit pt upright EOB to attempt increased alertness.  Unsuccessful.  Daughter present and also attempting to get her mom to respond.  Pt on 3 lts nasal at 96% and HR 74.  Remained lethargic "rag doll". ?  ? ?Transfers ?Overall transfer level: Needs assistance ?Equipment used: None ?Transfers: Bed to chair/wheelchair/BSC ?Sit to Stand: Total assist, +2 physical assistance, +2 safety/equipment ?  ?  ?  ?  ?  ?General transfer comment: to attempt increased alertness and improve airway, "Bear Hug" pivoted pt to recliner Total Assist pt 0%.  RN present during session. ?  ? ?Ambulation/Gait ?  ?  ?  ?  ?  ?  ?  ?General Gait Details: unable to due to lethargy.  Pt amb 90 feet 3 days ago. ? ? ?Stairs ?  ?  ?  ?  ?  ? ? ?Wheelchair Mobility ?  ? ?Modified Rankin (Stroke Patients Only) ?  ? ? ?  ?Balance   ?  ?  ?  ?  ?  ?  ?  ?  ?  ?  ?  ?  ?  ?  ?  ?  ?  ?  ?  ? ?  ?Cognition Arousal/Alertness: Lethargic ?  ?Overall Cognitive Status: Impaired/Different from baseline ?  ?  ?  ?  ?  ?  ?  ?  ?  ?  ?  ?  ?  ?  ?  ?  ?  General Comments: pt lethargic with trace alertness even with sternal rub.  Eyes gazed.  Mouth breathing with audible upper airway gurggling.  RN called to room. ?  ?  ? ?  ?Exercises   ? ?  ?General Comments   ?  ?  ? ?Pertinent Vitals/Pain Pain Assessment ?Pain Assessment: No/denies pain  ? ? ?Home Living   ?  ?  ?  ?  ?  ?  ?  ?  ?  ?   ?  ?Prior Function    ?  ?  ?   ? ?PT Goals (current goals can now be found in the care plan section) Progress towards PT goals: Progressing toward goals ? ?  ?Frequency ? ? ? Min 3X/week ? ? ? ?  ?PT Plan Current  plan remains appropriate  ? ? ?Co-evaluation   ?  ?  ?  ?  ? ?  ?AM-PAC PT "6 Clicks" Mobility   ?Outcome Measure ? Help needed turning from your back to your side while in a flat bed without using bedrails?: Total ?Help needed moving from lying on your back to sitting on the side of a flat bed without using bedrails?: Total ?Help needed moving to and from a bed to a chair (including a wheelchair)?: Total ?Help needed standing up from a chair using your arms (e.g., wheelchair or bedside chair)?: Total ?Help needed to walk in hospital room?: Total ?Help needed climbing 3-5 steps with a railing? : Total ?6 Click Score: 6 ? ?  ?End of Session Equipment Utilized During Treatment: Gait belt ?Activity Tolerance: Treatment limited secondary to medical complications (Comment);Other (comment) (verbally unresponsive) ?Patient left: in chair;with call bell/phone within reach;with family/visitor present ?Nurse Communication: Mobility status;Other (comment) (RN present during session) ?PT Visit Diagnosis: Unsteadiness on feet (R26.81) ?  ? ? ?Time: 1914-7829 ?PT Time Calculation (min) (ACUTE ONLY): 28 min ? ?Charges:  $Therapeutic Activity: 23-37 mins          ?          ? ?Rica Koyanagi  PTA ?Acute  Rehabilitation Services ?Pager      [phone] ?Office      [phone] ? ? ?

## 2021-10-14 NOTE — TOC Progression Note (Signed)
Transition of Care (TOC) - Progression Note  ? ? ?Patient Details  ?Name: Carla Foster ?MRN: [id number] ?Date of Birth: [birth year] ? ?Transition of Care (TOC) CM/SW Contact  ?Dessa Phi, RN ?Phone Number: ?10/14/2021, 3:55 PM ? ?Clinical Narrative:  No preference for [facility] agency per dtr-[employer] rep Tommi Rumps for [facility]. Monitor on 02 if needed @ home-no preference-[employer] rep Jermaine following.  ? ? ? ?Expected Discharge Plan: [place] ?Barriers to Discharge: Continued Medical Work up ? ?Expected Discharge Plan and Services ?Expected Discharge Plan: [place] ?  ?Discharge Planning Services: CM Consult ?Post Acute Care Choice: Home Health ?Living arrangements for the past 2 months: [place] ?                ?  ?  ?  ?  ?  ?HH Arranged: PT, OT ?[place] Agency: [place] ?Date HH Agency Contacted: 10/14/21 ?Time [place]: [phone] ?Representative spoke with at [place]: Tommi Rumps ? ? ?Social Determinants of Health (SDOH) Interventions ?  ? ?Readmission Risk Interventions ? ?  10/08/2021  ? 11:56 AM  ?Readmission Risk Prevention Plan  ?Transportation Screening Complete  ?PCP or Specialist Appt within 3-5 Days Complete  ?[place] or Home Care Consult Complete  ?Social Work Consult for [place] Planning/Counseling Complete  ?Palliative Care Screening Not Applicable  ?Medication Review Press photographer) Complete  ? ? ?

## 2021-10-14 NOTE — Progress Notes (Signed)
Patient difficult to arouse this AM. Responsive to sternal rub. Dr. Venetia Constable at bedside to see patient. Patient received melatonin overnight; orders for melatonin and benadryl discontinued.  Will continue to monitor patient closely.  ? ?O2 sat 94% on 3L [place]. HR 85, BP 111/52.  ?

## 2021-10-14 NOTE — Progress Notes (Addendum)
Called by the nurse that the patient continues to be lethargic, still having upper airway gurgling sounds, she remained stable satting greater 95% on 3 L of oxygen blood pressure 115/45 heart rate of 78 she had been eating fairly about 20 to 50% of her meals, with speaking to the daughter she relates that yesterday she did not want much to eat only ate a few bites. ?She did get melatonin at night the night prior but it is unlikely to be lasting this long. ?I am concerned that she might of aspirated, she has remained afebrile, will check a CBC with differential get a chest x-ray, ABG started empirically on IV Unasyn. ? ?I have talked to the daughter about her poor prognosis due to her recent COVID-19 infection with a superimposed bacterial infection, with new acute kidney injury with minimal mobility and severe hyponatremia. ?She says she understands she wants [place] to continue to do everything we can except for aggressive measures, she wants to fill her mother's wishes of not being intubated or resuscitated. ?We will continue aggressive treatment except resuscitation and intubation. ?Check blood cultures, place her n.p.o. ? ?ABG done showed a PCO2 which is mildly elevated at 55,  pH is 7.14 check a lactate, bicarb on be met is 21, this mild the elevated PCO2 will not explain her lethargy, either way she is not awake enough to be placed on BiPAP and the family does not want DNR or intubation. ?We will try to keep saturations just above 90%. ? ?Discussed with the daughter she continues to be a DNR/DNI. ? ?CBC with differential came back as showing mild leukocytosis but a hemoglobin of 6.6 (her previous hemoglobin 3 days prior to this was 9) check FOBT check an anemia panel transfused 2 units of packed red blood cells. ? ?

## 2021-10-14 NOTE — Progress Notes (Signed)
Pharmacy Antibiotic Note ? ?Carla Foster is a 81 y.o. female admitted on 10/01/2021 with  aspiration PNA .  Pharmacy has been consulted for Unasyn dosing. Pt with recent COVID infection and now with superimposed pneumonia.  ? ?Plan: ?Unasyn 3 gr IV q6h  ?Monitor clinical course, renal function, cultures as available ? ?Height: '5\' 6"'$  (167.6 cm) ?Weight: 74 kg (163 lb 2.3 oz) ?IBW/kg (Calculated) : 59.3 ? ?Temp (24hrs), Avg:98.4 ?F (36.9 ?C), Min:98.3 ?F (36.8 ?C), Max:98.5 ?F (36.9 ?C) ? ?Recent Labs  ?Lab 10/08/21 ?4158 10/08/21 ?0933 10/09/21 ?0423 10/09/21 ?1438 10/10/21 ?0409 10/10/21 ?3094 10/11/21 ?0250 10/11/21 ?1532 10/12/21 ?0320 10/13/21 ?0768 10/14/21 ?0406  ?WBC 12.6*  --  12.3*  --  12.2*  --   --   --   --   --   --   ?CREATININE 1.17*   < > 1.23*   < >  --    < > 1.50* 1.38* 1.30* 1.09* 1.15*  ? < > = values in this interval not displayed.  ?  ?Estimated Creatinine Clearance: 38.8 mL/min (A) (by C-G formula based on SCr of 1.15 mg/dL (H)).   ? ?Allergies  ?Allergen Reactions  ? Tiazac [Diltiazem Hcl] Swelling  ?  Ankle edema  ? ? ?  ?Antimicrobials this admission:  ?3/28 Unasyn  >>  ? ?  ?Royetta Asal, PharmD, BCPS ?10/14/2021 3:28 PM ? ? ?

## 2021-10-14 NOTE — Progress Notes (Signed)
[facility] ?PROGRESS NOTE ? ? ? ?Progress Note  ?Carla Foster  OFB:[id number] DOB: September 06, 1940 DOA: 09/23/2021 ?PCP: Leamon Arnt, MD  ? ? ? ?Brief Narrative:  ? ?Carla Foster is an 81 y.o. female past medical history significant for essential hypertension diabetes mellitus type 2 chronic kidney disease stage IIIb history of cancer presents with cough and dyspnea that started 10 days prior to admission.  She has required more assistance going around the house not using her walker, eventually her cough became productive chest x-ray showed community-acquired pneumonia. ? ? ? ?Assessment/Plan:  ? ?Acute respiratory failure with hypoxia secondarily to COVID-19 pneumonia with superimposed community-acquired pneumonia: ?Outside the window for antiviral therapy. ?Has remained afebrile, she completed her course of antibiotics in house. ?Out of bed to chair, continue encourage incentive spirometry. ?Now requiring 3 L of oxygen to keep saturations greater than 90%. ?Physical therapy evaluated the patient, she will go home with home health. ? ?Severe sepsis due to community-acquired pneumonia: ?Now requiring 3 L of oxygen. ?Anything above 88% on her is good. ? ?Hypovolemic hyponatremia: ?Renal was consulted, appreciate assistance sodium this morning is 130. ? ?Acute kidney injury on chronic kidney disease stage III: ?With a baseline creatinine of around 1, she was started on IV fluids her creatinine returned to baseline. ?Holding ARB. ? ?Elevated troponins: ?Elevation in troponin demand ischemia. ? ?Diabetes mellitus type 2 with hyperglycemia: ?An A1c of 7.4, had an episode of hypoglycemia, with meal coverage was held. ?Now tolerating her diet her blood glucose going up start long-acting insulin resume sliding scale. ? ?Essential hypertension: ?Continue Norvasc and Coreg  ?Hold diuretic therapy. ? ?Chronic diastolic heart failure: ?Continue hold diuretic therapy and ARB ? ?Anemia of chronic renal disease: ?No signs  of overt bleeding.  Hemoglobin is currently stable. ? ?Acute metabolic encephalopathy: ?Question acute confusional state, she is hard to wake up this morning responds to voice is not able to follow commands but she open her eyes to command. ?She did the last time she got melatonin and cough medication. ?Discontinue melatonin and Benadryl. ? ?DVT prophylaxis: lovenox ?Family Communication:none ?Status is: Inpatient ?Remains inpatient appropriate because: Sepsis due to community-acquired pneumonia ? ? ? ?Code Status:  ? ?  ?Code Status Orders  ?(From admission, onward)  ?  ? ? ?  ? ?  Start     Ordered  ? 10/09/2021 1311  Do not attempt resuscitation (DNR)  Continuous       ?Question Answer Comment  ?In the event of cardiac or respiratory ARREST Do not call a ?code blue?   ?In the event of cardiac or respiratory ARREST Do not perform Intubation, CPR, defibrillation or ACLS   ?In the event of cardiac or respiratory ARREST Use medication by any route, position, wound care, and other measures to relive pain and suffering. May use oxygen, suction and manual treatment of airway obstruction as needed for comfort.   ?Comments confirmed with pt/dtrs   ?  ? 10/11/2021 1310  ? ?  ?  ? ?  ? ?Code Status History   ? ? Date Active Date Inactive Code Status Order ID Comments User Context  ? 07/26/2018 0048 08/02/2018 1818 Full Code [id number]  Corey Harold, NP ED  ? 11/20/2017 1301 11/23/2017 1446 Full Code [id number]  Radene Gunning, NP ED  ? ?  ? ?Advance Directive Documentation   ? ?Flowsheet Row Most Recent Value  ?Type of Advance Directive Healthcare Power of [place], Living will, Out  of facility DNR (pink MOST or yellow form)  ?Pre-existing out of facility DNR order (yellow form or pink MOST form) Physician notified to receive inpatient order  ?"MOST" Form in Place? --  ? ?  ? ? ? ? ?IV Access:  ? ?Peripheral IV ? ? ?Procedures and diagnostic studies:  ? ?No results found. ? ? ?Medical Consultants:  ? ?None. ? ? ?Subjective:   ? ? ?Carla Foster sleepy this morning again. ? ?Objective:  ? ? ?Vitals:  ? 10/13/21 2131 10/14/21 0200 10/14/21 0500 10/14/21 0521  ?BP: 135/61   (!) 129/53  ?Pulse: 96   86  ?Resp: 16   (!) 21  ?Temp: 98.3 ?F (36.8 ?C)   98.5 ?F (36.9 ?C)  ?TempSrc:    Axillary  ?SpO2: 93% 91%  91%  ?Weight:   74 kg   ?Height:      ? ?SpO2: 91 % ?O2 Flow Rate (L/min): 3 L/min ? ? ?Intake/Output Summary (Last 24 hours) at 10/14/2021 0756 ?Last data filed at 10/14/2021 1696 ?Gross per 24 hour  ?Intake 2271.24 ml  ?Output 750 ml  ?Net 1521.24 ml  ? ? ?Filed Weights  ? 10/12/21 0500 10/13/21 0348 10/14/21 0500  ?Weight: 68 kg 72.6 kg 74 kg  ? ? ?Exam: ?General exam: In no acute distress, sleepy this morning ?Respiratory system: She has good air movement lungs are clear, she has a gurgly secretion in the upper airway ?Cardiovascular system: S1 & S2 heard, RRR. No JVD. ?Gastrointestinal system: Abdomen is nondistended, soft and nontender.  ?Central nervous system: Only responsive to not side stimuli ?Extremities: No pedal edema. ?Skin: No rashes, lesions or ulcers ?Psychiatry: No judgment or insight of medical condition. ? ? ? ?Data Reviewed:  ? ? ?Labs: ?Basic Metabolic Panel: ?Recent Labs  ?Lab 10/06/2021 ?1604 09/19/2021 ?2212 10/08/21 ?7893 10/08/21 ?0933 10/08/21 ?1507 10/09/21 ?0423 10/09/21 ?1438 10/10/21 ?0409 10/10/21 ?8101 10/11/21 ?0250 10/11/21 ?1153 10/11/21 ?1532 10/11/21 ?2321 10/12/21 ?0320 10/12/21 ?7510 10/13/21 ?2585 10/14/21 ?0406  ?NA 126* 123* 124* 125* 126* 120*   < >  --    < > 121*   < > 123* 124* 125* 126* 128* 130*  ?K 4.8 4.2 4.2 4.4 4.1 4.2   < >  --    < > 4.0  --  4.1  --  4.1  --  4.2 5.1  ?CL 98 97* 97* 95* 99 95*   < >  --    < > 93*  --  95*  --  97*  --  102 103  ?CO2 20* 18* 14* 20* 20* 17*   < >  --    < > 19*  --  22  --  22  --  20* 21*  ?GLUCOSE 225* 97 157* 176* 75 159*   < >  --    < > 117*  --  60*  --  128*  --  283* 138*  ?BUN 45* 41* 40* 41* 41* 38*   < >  --    < > 38*  --  39*  --  34*  --   33* 33*  ?CREATININE 1.21* 1.15* 1.17* 1.28* 1.24* 1.23*   < >  --    < > 1.50*  --  1.38*  --  1.30*  --  1.09* 1.15*  ?CALCIUM 8.1* 7.9* 8.2* 8.2* 8.0* 7.9*   < >  --    < > 8.1*  --  8.4*  --  8.2*  --  8.4* 8.9  ?MG  --   --  2.1  --   --  1.9  --  1.8  --   --   --   --   --   --   --   --   --   ?PHOS 2.5 2.1* 2.2* 2.4* 2.0*  --   --   --   --   --   --   --   --   --   --   --   --   ? < > = values in this interval not displayed.  ? ? ?GFR ?Estimated Creatinine Clearance: 38.8 mL/min (A) (by C-G formula based on SCr of 1.15 mg/dL (H)). ?Liver Function Tests: ?Recent Labs  ?Lab 10/10/2021 ?1604 09/17/2021 ?2212 10/08/21 ?0626 10/08/21 ?0933 10/08/21 ?1507  ?ALBUMIN 2.9* 2.8* 2.7* 2.5* 2.5*  ? ? ?No results for input(s): LIPASE, AMYLASE in the last 168 hours. ? ?No results for input(s): AMMONIA in the last 168 hours. ?Coagulation profile ?No results for input(s): INR, PROTIME in the last 168 hours. ?COVID-19 Labs ? ?No results for input(s): DDIMER, FERRITIN, LDH, CRP in the last 72 hours. ? ? ?Lab Results  ?Component Value Date  ? SARSCOV2NAA POSITIVE (A) 10/15/2021  ? [place] NEGATIVE 07/06/2019  ? ? ?CBC: ?Recent Labs  ?Lab 10/08/21 ?9485 10/09/21 ?0423 10/10/21 ?0409  ?WBC 12.6* 12.3* 12.2*  ?NEUTROABS 11.2* 11.0* 10.9*  ?HGB 10.0* 9.4* 9.2*  ?HCT 31.3* 29.1* 27.5*  ?MCV 89.2 87.9 85.7  ?PLT 299 318 307  ? ? ?Cardiac Enzymes: ?No results for input(s): CKTOTAL, CKMB, CKMBINDEX, TROPONINI in the last 168 hours. ?BNP (last 3 results) ?No results for input(s): PROBNP in the last 8760 hours. ?CBG: ?Recent Labs  ?Lab 10/13/21 ?0741 10/13/21 ?1158 10/13/21 ?1714 10/13/21 ?2126 10/14/21 ?4627  ?GLUCAP 249* 256* 75 72 150*  ? ? ?D-Dimer: ?No results for input(s): DDIMER in the last 72 hours. ? ?Hgb A1c: ?No results for input(s): HGBA1C in the last 72 hours. ? ?Lipid Profile: ?No results for input(s): CHOL, HDL, LDLCALC, TRIG, CHOLHDL, LDLDIRECT in the last 72 hours. ?Thyroid function studies: ?No results for  input(s): TSH, T4TOTAL, T3FREE, THYROIDAB in the last 72 hours. ? ?Invalid input(s): FREET3 ?Anemia work up: ?No results for input(s): VITAMINB12, FOLATE, FERRITIN, TIBC, IRON, RETICCTPCT in the last 72 hours. ? ?Sepsis

## 2021-10-14 NOTE — Progress Notes (Signed)
Physical Therapy Treatment ?Patient Details ?Name: Carla Foster ?MRN: [id number] ?DOB: 1941-07-12 ?Today's Date: 10/14/2021 ? ? ?History of Present Illness Pt is an 81 y.o. female presenting with cough and dyspnea. Admitted for treatment of acute respiratory failure with hypoxia secondarily to COVID-19 pneumonia with superimposed community-acquired pneumonia, hypovolemic hyponatremia, and sepsis . PMH significant for essential hypertension, asthma, HLD, PAD, diabetes mellitus type 2, chronic kidney disease, history of cancer , and cervical discectomy (2002). ? ?  ?PT Comments  ? ? Assisted back to bed was difficult.  RN assisted.  Positioned upright.  Remains lethargic and gurggly (breathing).   ?See earlier PT note for mobility details.   ?Recommendations for follow up therapy are one component of a multi-disciplinary discharge planning process, led by the attending physician.  Recommendations may be updated based on patient status, additional functional criteria and insurance authorization. ? ?Follow Up Recommendations ? Home health PT ?  ?  ?Assistance Recommended at Discharge Frequent or constant Supervision/Assistance  ?Patient can return home with the following A little help with walking and/or transfers;A little help with bathing/dressing/bathroom;Help with stairs or ramp for entrance ?  ?Equipment Recommendations ? None recommended by PT  ?  ?Recommendations for Other Services   ? ? ?  ?Precautions / Restrictions Precautions ?Precautions: Fall ?Restrictions ?Weight Bearing Restrictions: No  ?  ? ?Mobility ?  Assisted back to bed via SPS + 2 side by side Total assist.  Pt remained lethargic.  Positioned upright to ensure airway.  MD had just left room.  Daughter still at bedside.  ?  ?  ? ? ?  ?Balance   ?  ?  ?  ?  ?  ?  ?  ?  ?  ?  ?  ?  ?  ?  ?  ?  ?  ?  ?  ? ?  ?Cognition Arousal/Alertness: Lethargic ?  ?Overall Cognitive Status: Impaired/Different from baseline ?  ?  ?  ?  ?  ?  ?  ?  ?  ?  ?  ?  ?  ?  ?   ?  ?General Comments: pt lethargic with trace alertness even with sternal rub.  Eyes gazed.  Mouth breathing with audible upper airway gurggling.  RN called to room. ?  ?  ? ?  ?Exercises   ? ?  ?General Comments   ?  ?  ? ?Pertinent Vitals/Pain Pain Assessment ?Pain Assessment: No/denies pain  ? ? ?Home Living   ?  ?  ?  ?  ?  ?  ?  ?  ?  ?   ?  ?Prior Function    ?  ?  ?   ? ?PT Goals (current goals can now be found in the care plan section) Progress towards PT goals: Progressing toward goals ? ?  ?Frequency ? ? ? Min 3X/week ? ? ? ?  ?PT Plan Current plan remains appropriate  ? ? ?Co-evaluation   ?  ?  ?  ?  ? ?  ?AM-PAC PT "6 Clicks" Mobility   ?Outcome Measure ? Help needed turning from your back to your side while in a flat bed without using bedrails?: Total ?Help needed moving from lying on your back to sitting on the side of a flat bed without using bedrails?: Total ?Help needed moving to and from a bed to a chair (including a wheelchair)?: Total ?Help needed standing up from a chair using your arms (e.g., wheelchair or bedside  chair)?: Total ?Help needed to walk in hospital room?: Total ?Help needed climbing 3-5 steps with a railing? : Total ?6 Click Score: 6 ? ?  ?End of Session Equipment Utilized During Treatment: Gait belt ?Activity Tolerance: Treatment limited secondary to medical complications (Comment);Other (comment) (verbally unresponsive) ?Patient left: in bed ?Nurse Communication: Mobility status;Other (comment) (RN present during session) ?PT Visit Diagnosis: Unsteadiness on feet (R26.81) ?  ? ? ?Time: 9390-3009 ?PT Time Calculation (min) (ACUTE ONLY): 18 min ? ?Charges:  $Therapeutic Activity: 8-22 mins          ?          ? ?{Tulio Facundo  PTA ?Acute  Rehabilitation Services ?Pager      [phone] ?Office      [phone] ? ?

## 2021-10-15 ENCOUNTER — Inpatient Hospital Stay (HOSPITAL_COMMUNITY): Payer: Medicare Other

## 2021-10-15 DIAGNOSIS — A419 Sepsis, unspecified organism: Secondary | ICD-10-CM | POA: Diagnosis not present

## 2021-10-15 DIAGNOSIS — E871 Hypo-osmolality and hyponatremia: Secondary | ICD-10-CM | POA: Diagnosis not present

## 2021-10-15 DIAGNOSIS — J189 Pneumonia, unspecified organism: Secondary | ICD-10-CM | POA: Diagnosis not present

## 2021-10-15 DIAGNOSIS — Z515 Encounter for palliative care: Secondary | ICD-10-CM

## 2021-10-15 DIAGNOSIS — I5032 Chronic diastolic (congestive) heart failure: Secondary | ICD-10-CM | POA: Diagnosis not present

## 2021-10-15 DIAGNOSIS — L8990 Pressure ulcer of unspecified site, unspecified stage: Secondary | ICD-10-CM | POA: Insufficient documentation

## 2021-10-15 LAB — COMPREHENSIVE METABOLIC PANEL
ALT: 21 U/L (ref 0–44)
AST: 22 U/L (ref 15–41)
Albumin: 2.3 g/dL — ABNORMAL LOW (ref 3.5–5.0)
Alkaline Phosphatase: 38 U/L (ref 38–126)
Anion gap: 9 (ref 5–15)
BUN: 51 mg/dL — ABNORMAL HIGH (ref 8–23)
CO2: 19 mmol/L — ABNORMAL LOW (ref 22–32)
Calcium: 9.1 mg/dL (ref 8.9–10.3)
Chloride: 104 mmol/L (ref 98–111)
Creatinine, Ser: 1.82 mg/dL — ABNORMAL HIGH (ref 0.44–1.00)
GFR, Estimated: 27 mL/min — ABNORMAL LOW (ref >60)
Glucose, Bld: 157 mg/dL — ABNORMAL HIGH (ref 70–99)
Potassium: 5.5 mmol/L — ABNORMAL HIGH (ref 3.5–5.1)
Sodium: 132 mmol/L — ABNORMAL LOW (ref 135–145)
Total Bilirubin: 0.6 mg/dL (ref 0.3–1.2)
Total Protein: 6.1 g/dL — ABNORMAL LOW (ref 6.5–8.1)

## 2021-10-15 LAB — PROCALCITONIN: Procalcitonin: 5.3 ng/mL

## 2021-10-15 LAB — C-REACTIVE PROTEIN: CRP: 8.1 mg/dL — ABNORMAL HIGH (ref <1.0)

## 2021-10-15 LAB — TROPONIN I (HIGH SENSITIVITY)
Troponin I (High Sensitivity): 54 ng/L — ABNORMAL HIGH (ref <18)
Troponin I (High Sensitivity): 70 ng/L — ABNORMAL HIGH (ref <18)

## 2021-10-15 LAB — CBC WITH DIFFERENTIAL/PLATELET
Abs Immature Granulocytes: 0.19 10*3/uL — ABNORMAL HIGH (ref 0.00–0.07)
Basophils Absolute: 0 10*3/uL (ref 0.0–0.1)
Basophils Relative: 0 %
Eosinophils Absolute: 0 10*3/uL (ref 0.0–0.5)
Eosinophils Relative: 0 %
HCT: 32.1 % — ABNORMAL LOW (ref 36.0–46.0)
Hemoglobin: 10.5 g/dL — ABNORMAL LOW (ref 12.0–15.0)
Immature Granulocytes: 1 %
Lymphocytes Relative: 1 %
Lymphs Abs: 0.2 10*3/uL — ABNORMAL LOW (ref 0.7–4.0)
MCH: 29.9 pg (ref 26.0–34.0)
MCHC: 32.7 g/dL (ref 30.0–36.0)
MCV: 91.5 fL (ref 80.0–100.0)
Monocytes Absolute: 0.9 10*3/uL (ref 0.1–1.0)
Monocytes Relative: 4 %
Neutro Abs: 19.4 10*3/uL — ABNORMAL HIGH (ref 1.7–7.7)
Neutrophils Relative %: 94 %
Platelets: 326 10*3/uL (ref 150–400)
RBC: 3.51 MIL/uL — ABNORMAL LOW (ref 3.87–5.11)
RDW: 14.4 % (ref 11.5–15.5)
WBC: 20.7 10*3/uL — ABNORMAL HIGH (ref 4.0–10.5)
nRBC: 0 % (ref 0.0–0.2)

## 2021-10-15 LAB — GLUCOSE, CAPILLARY
Glucose-Capillary: 160 mg/dL — ABNORMAL HIGH (ref 70–99)
Glucose-Capillary: 105 mg/dL — ABNORMAL HIGH (ref 70–99)

## 2021-10-15 LAB — BRAIN NATRIURETIC PEPTIDE: B Natriuretic Peptide: 1310.6 pg/mL — ABNORMAL HIGH (ref 0.0–100.0)

## 2021-10-15 MED ORDER — POLYVINYL ALCOHOL 1.4 % OP SOLN: 1.0000 [drp] | Freq: Four times a day (QID) | OPHTHALMIC | Status: DC | PRN

## 2021-10-15 MED ORDER — HYDROMORPHONE BOLUS VIA INFUSION
1.0000 mg | INTRAVENOUS | Status: DC | PRN
  Administered 2021-10-15 (×2): 1 mg via INTRAVENOUS
  Filled 2021-10-15: qty 1

## 2021-10-15 MED ORDER — SODIUM CHLORIDE 0.9 % IV SOLN
0.5000 mg/h | INTRAVENOUS | Status: DC
  Administered 2021-10-15: 0.5 mg/h via INTRAVENOUS
  Filled 2021-10-15: qty 5

## 2021-10-15 MED ORDER — ORAL CARE MOUTH RINSE
15.0000 mL | Freq: Two times a day (BID) | OROMUCOSAL | Status: DC
  Administered 2021-10-15 – 2021-10-16 (×4): 15 mL via OROMUCOSAL

## 2021-10-15 MED ORDER — HYDROMORPHONE HCL 1 MG/ML IJ SOLN: 1.0000 mg | INTRAMUSCULAR | Status: AC

## 2021-10-15 MED ORDER — BIOTENE DRY MOUTH MT LIQD: 15.0000 mL | OROMUCOSAL | Status: DC | PRN

## 2021-10-15 MED ORDER — DEXTROSE 5 % IV SOLN
10.0000 mg/h | INTRAVENOUS | Status: DC
  Administered 2021-10-15: 10 mg/h via INTRAVENOUS
  Filled 2021-10-15: qty 20

## 2021-10-15 MED ORDER — LORAZEPAM 1 MG PO TABS: 1.0000 mg | ORAL_TABLET | ORAL | Status: DC | PRN

## 2021-10-15 MED ORDER — GLYCOPYRROLATE 0.2 MG/ML IJ SOLN
0.2000 mg | INTRAMUSCULAR | Status: DC | PRN
  Filled 2021-10-15: qty 1

## 2021-10-15 MED ORDER — LORAZEPAM 2 MG/ML IJ SOLN: 1.0000 mg | INTRAMUSCULAR | Status: DC | PRN

## 2021-10-15 MED ORDER — LORAZEPAM 2 MG/ML PO CONC
1.0000 mg | ORAL | Status: DC | PRN
  Filled 2021-10-15: qty 0.5

## 2021-10-15 MED ORDER — HALOPERIDOL LACTATE 2 MG/ML PO CONC
0.5000 mg | ORAL | Status: DC | PRN
  Filled 2021-10-15: qty 0.3

## 2021-10-15 MED ORDER — HALOPERIDOL LACTATE 5 MG/ML IJ SOLN: 0.5000 mg | INTRAMUSCULAR | Status: DC | PRN

## 2021-10-15 MED ORDER — HALOPERIDOL 0.5 MG PO TABS
0.5000 mg | ORAL_TABLET | ORAL | Status: DC | PRN
  Filled 2021-10-15: qty 1

## 2021-10-15 MED ORDER — FUROSEMIDE 10 MG/ML IJ SOLN
40.0000 mg | Freq: Once | INTRAMUSCULAR | Status: AC
Start: 2021-10-15 — End: 2021-10-15
  Administered 2021-10-15: 40 mg via INTRAVENOUS
  Filled 2021-10-15: qty 4

## 2021-10-15 MED ORDER — GLYCOPYRROLATE 1 MG PO TABS
1.0000 mg | ORAL_TABLET | ORAL | Status: DC | PRN
  Filled 2021-10-15: qty 1

## 2021-10-15 MED ORDER — CHLORHEXIDINE GLUCONATE CLOTH 2 % EX PADS
6.0000 | MEDICATED_PAD | Freq: Every day | CUTANEOUS | Status: DC
  Administered 2021-10-15: 6 via TOPICAL

## 2021-10-15 NOTE — Consult Note (Signed)
? ?                                                                                ?Consultation Note ?Date: 10/15/2021  ? ?Patient Name: Carla Foster  ?DOB: 04-Feb-1941  MRN: [id number]  Age / Sex: 81 y.o., female  ?PCP: Leamon Arnt, MD ?Referring Physician: Florencia Reasons, MD ? ?Reason for Consultation:  Goals of care ? ?HPI/Patient Profile: 81 y.o. female  with past medical history of DM2, CKD, HTN, HL, admitted on 10/12/2021 with Covid + and bacterial pneumonia. She was improving, however, has had significant decompensation- some concerns for aspiration event. Has acute on chronic kidney injury that is worsening. She is septic- hypothermic, and not recovering. Today BNP is significantly elevated, PCT is 5.3, Cr is trending up, WBC has doubled from yesterday. Palliative medicine consulted for [place].   ? ?Primary Decision Maker ?NEXT OF KIN ? ?Discussion: ?Evaluated patient. She appears to be dying- unresponsive, loss of jaw muscle control- breathing is labored and rate is in the 30's.  ?Met with her two daughters- they are her only children, no living spouse.  ?Her daughters share that their Mom told them on Friday that she was dying. She was also seeing a man in the room. Ever since Friday she has continued to decline.  ?I expressed concern to her daughters that their Mom is dying and is also suffering. They are in agreement.  ?I discussed option for ongoing care- lasix infusion has been started- vs transitioning to full comfort measures only and allowing for natural dying process with symptom management, comfort and dignity.  ?Her daughters are in agreement with this plan.  ?They have a pastor who is coming to support them.   ? ?SUMMARY OF RECOMMENDATIONS ?-Darcey is unfortunately actively dying- plan for transition to full comfort measures only ?-Symptom management-  ?-247m IV hydromorphone now ?-Hydromorphone infusion .525mhr with 47m19molus q15 min prn for SOB, air hunger or any signs of discomfort ?-Robinul .2mg32m4hr prn for secretions ?-lorazepam 47mg 147m hrs prn IV for anxiety or agitation  ? ?Code Status/Advance Care Planning: ?DNR ? ? ?Prognosis:   ?Hours - Days ? ?Discharge Planning: Anticipated Hospital Death ? ?Primary Diagnoses: ?Present on Admission: ? Hyponatremia ? Pulmonary hypertension (HCC) [place] ? Chronic heart failure with preserved ejection fraction (HCC) [place] of chronic disease ? ? ?Review of Systems ? ?Physical Exam ? ?Vital Signs: BP (!) 136/38   Pulse 96   Temp 98.2 ?F (36.8 ?C) (Axillary)   Resp (!) 30   Ht 5' 6"  (1.676 m)   Wt 74.4 kg   SpO2 94%   BMI 26.47 kg/m?  ?Pain Scale: CPOT ?POSS *See Group Information*: S-Acceptable,Sleep, easy to arouse ?Pain Score: 0-No pain ? ? ?SpO2: SpO2: 94 % ?O2 Device:SpO2: 94 % ?O2 Flow Rate: .O2 Flow Rate (L/min): 3 L/min ? ?IO: Intake/output summary:  ?Intake/Output Summary (Last 24 hours) at 10/15/2021 1348 ?Last data filed at 10/15/2021 1336 ?Gross per 24 hour  ?Intake 1064 ml  ?Output 60 ml  ?Net 1004 ml  ? ? ?LBM: Last BM Date : 10/13/21 ?Baseline Weight: Weight: 66.2 kg ?Most recent weight:  Weight: 74.4 kg     ?Palliative Assessment/Data: PPS: 10% ? ? ? ? ? ? ?Thank you for this consult. Palliative medicine will continue to follow and assist as needed.  ? ? ?Signed by: ?Mariana Kaufman, AGNP-C ?Palliative Medicine ? ?  ?Please contact Palliative Medicine Team phone at [phone] for questions and concerns.  ?For individual provider: See Amion ? ? ? ? ? ? ? ? ? ? ? ? ? ? ?

## 2021-10-15 NOTE — Progress Notes (Signed)
Rapid Response Event Note  ? ?Reason for Call : pt has a temperature of 93.5 rectally ? ? ?Initial Focused Assessment:  pt very lethargic, unable to follow commands.  Breath sounds decreased and rhonchi.  Pupils intact. Pt does arouse some with sternal rub.  Pt currently receiving blood infusion.   ? ? ?Interventions: VS see flow sheet.  Temperature remains 94 rectally after heat packs.   ? ? ?Plan of Care: Transfer to [place] for warming blanket per [facility], NP and closer monitoring.   ? ? ?Event Summary:  ? ?MD Notified: yes ?Call Rafael Capo ?Arrival Time: 2321 ?End Time: 0015 ? ?Dyann Ruddle, RN ?

## 2021-10-15 NOTE — Progress Notes (Signed)
? ? ?  OVERNIGHT PROGRESS REPORT ? ?Notified by rapid response nurse for patient lethargy and airway gurgling, rhonchi. ?Patient appears as earlier notes. ?PRBC are infusing however patient temperature is 93.5 multiple location measurements. ? ?Patient is DNR/DNI moved to stepdown for temperature. ?Family was notified by assigned RN. ? ?Gershon Cull MSNA MSN ACNPC-AG ?Acute Care Nurse Practitioner ?[facility] ?[place] ? ? ? ?

## 2021-10-15 NOTE — Progress Notes (Signed)
0020 Pt transferred to ICU bed 30.  ?0030 Daughter Cherlynn June called and notified of trnasfer ?

## 2021-10-15 NOTE — Progress Notes (Signed)
?PROGRESS NOTE ? ? ? ?Carla Foster  NFA:[id number] DOB: 1941/01/22 DOA: 09/27/2021 ?PCP: Leamon Arnt, MD  ? ? ? ?Brief Narrative:  ?Carla Foster is an 81 y.o. female past medical history significant for essential hypertension diabetes mellitus type 2 chronic kidney disease stage IIIb history of cancer presents with cough and dyspnea that started 10 days prior to admission.  She has required more assistance going around the house not using her walker, eventually her cough became productive chest x-ray showed community-acquired pneumonia. ? ? ?Subjective: ? ?Labored breathing, not responding, minimal urine output, has not eaten anything for the last two days due to decreased mentation ? ?Family at bedside  ? ?Assessment & Plan: ? Principal Problem: ?  Hyponatremia ?Active Problems: ?  Type 2 diabetes mellitus ([place]) ?  HTN (hypertension) ?  Pulmonary hypertension ([place]) ?  Asthma ?  Chronic heart failure with preserved ejection fraction ([place]) ?  Anemia of chronic disease ?  COVID-19 virus infection ?  Pleuritic chest pain ?  Elevated brain natriuretic peptide (BNP) level ?  Elevated troponin ?  Severe sepsis ([place]) ?  Pressure injury of skin ? ? ? ?Assessment and Plan: ? ?Acute respiratory failure with hypoxia secondarily to COVID-19 pneumonia with superimposed community-acquired pneumonia/sepsis on admission : ?Outside the window for antiviral therapy. ?she completed her course of antibiotics in house. ?Initially showed improvement, however, she became lethargic, not eating, not responding since Saturday ? ?Acute metabolic encephalopathy  ?CT head no acute findings ?Appears started from Saturday, she has not made any improvement, not taking oral intake, with worsening renal function ?Palliative care consulted ?Family decided to transition to full comfort measures ?  ?Possible acute on chronic diastolic heart failure/elevation of troponins and BNP ?Not responding to lasix, with worsening renal function, now  transition to full comfort measures ? ?Acute kidney injury on chronic kidney disease stage III/anemia of chronic disease ?Cr worsening ? ?Hyponatremia, presents on admission : ?Sodium 124 on presentation ,renal was consulted, sodium improved ?  ?Diabetes mellitus type 2 with hyperglycemia and hypoglycemia: ?  ?  ? ?Skin Assessment: ?I have examined the patient?s skin and I agree with the wound assessment as performed by the wound care RN as outlined below: ?Pressure Injury 10/15/21 Buttocks Medial;Bilateral Stage 1 -  Intact skin with non-blanchable redness of a localized area usually over a bony prominence. pink skin in the gluteal folds running from the top towards the perineum (Active)  ?10/15/21 0314  ?Location: Buttocks  ?Location Orientation: Medial;Bilateral  ?Staging: Stage 1 -  Intact skin with non-blanchable redness of a localized area usually over a bony prominence.  ?Wound Description (Comments): pink skin in the gluteal folds running from the top towards the perineum  ?Present on Admission:   ?Dressing Type Foam - Lift dressing to assess site every shift 10/15/21 0746  ? ? ? ?I have Reviewed nursing notes, Vitals, pain scores, I/o's, Lab results and  imaging results since pt's last encounter, details please see discussion above  ? ? ? ?DVT prophylaxis: on full comfort measures  ? ? ?Code Status:   Code Status: DNR ? ?Family Communication: daughter  ?Disposition:  ? ?Status is: Inpatient ? ?  ?Dispo: The patient is from: home ?             Anticipated d/c is to: Started on full comfort measures, possible in-hospital death,  ?             ?Antimicrobials:   ? ?Anti-infectives (  From admission, onward)  ? ? Start     Dose/Rate Route Frequency Ordered Stop  ? 10/14/21 1600  Ampicillin-Sulbactam (UNASYN) 3 g in sodium chloride 0.9 % 100 mL IVPB  Status:  Discontinued       ? 3 g ?200 mL/hr over 30 Minutes Intravenous Every 6 hours 10/14/21 1529 10/14/21 1730  ? 10/08/21 1000  azithromycin (ZITHROMAX) tablet  500 mg       ? 500 mg Oral Daily 09/29/2021 1537 10/12/21 0955  ? 10/08/21 0900  cefTRIAXone (ROCEPHIN) 2 g in sodium chloride 0.9 % 100 mL IVPB       ? 2 g ?200 mL/hr over 30 Minutes Intravenous Every 24 hours 09/21/2021 1537 10/12/21 1042  ? 10/09/2021 0900  cefTRIAXone (ROCEPHIN) 1 g in sodium chloride 0.9 % 100 mL IVPB       ? 1 g ?200 mL/hr over 30 Minutes Intravenous  Once 09/25/2021 0847 10/02/2021 1016  ? 10/16/2021 0900  azithromycin (ZITHROMAX) 500 mg in sodium chloride 0.9 % 250 mL IVPB       ? 500 mg ?250 mL/hr over 60 Minutes Intravenous  Once 09/27/2021 0847 09/26/2021 1124  ? ?  ? ? ? ? ? ?Objective: ?Vitals:  ? 10/15/21 0800 10/15/21 0900 10/15/21 1000 10/15/21 1100  ?BP: (!) 132/38 (!) 136/38 (!) 134/43 (!) 124/35  ?Pulse: 95 97 96 90  ?Resp: (!) 32 (!) 35 (!) 27 (!) 29  ?Temp:      ?TempSrc:      ?SpO2: 94% 94% 93% 96%  ?Weight:      ?Height:      ? ? ?Intake/Output Summary (Last 24 hours) at 10/15/2021 1304 ?Last data filed at 10/15/2021 0900 ?Gross per 24 hour  ?Intake 1064 ml  ?Output 0 ml  ?Net 1064 ml  ? ?Filed Weights  ? 10/13/21 0348 10/14/21 0500 10/15/21 0237  ?Weight: 72.6 kg 74 kg 74.4 kg  ? ? ?Examination: ? ?General exam: Labored breathing, not responding ?Respiratory system: Diminished, rales, labored breathing ?Cardiovascular system:  RRR.  ? ? ? ? ?Data Reviewed: I have personally reviewed  labs and visualized  imaging studies since the last encounter and formulate the plan  ? ? ? ? ? ? ?Scheduled Meds: ? sodium chloride   Intravenous Once  ? carvedilol  25 mg Oral BID WC  ? Chlorhexidine Gluconate Cloth  6 each Topical Daily  ? insulin aspart  0-5 Units Subcutaneous QHS  ? insulin aspart  0-9 Units Subcutaneous TID WC  ? Ipratropium-Albuterol  1 puff Inhalation TID  ? mouth rinse  15 mL Mouth Rinse BID  ? mouth rinse  15 mL Mouth Rinse BID  ? mometasone-formoterol  2 puff Inhalation BID  ? mupirocin ointment   Nasal BID  ? simvastatin  20 mg Oral QHS  ? ?Continuous Infusions: ? sodium chloride    ?  furosemide (LASIX) 200 mg in dextrose 5% 100 mL ('2mg'$ /mL) infusion    ? ? ? LOS: 8 days  ? ? ? ? ?Florencia Reasons, MD PhD FACP ?[facility] ? ?Available via Epic secure chat 7am-7pm for nonurgent issues ?Please page for urgent issues ?To page the attending provider between 7A-7P or the covering provider during after hours 7P-7A, please log into the web site [email or web address] and access using universal [place] password for that web site. If you do not have the password, please call the hospital operator. ? ? ? ?10/15/2021, 1:04 PM  ? ? ?

## 2021-10-16 DIAGNOSIS — E871 Hypo-osmolality and hyponatremia: Secondary | ICD-10-CM | POA: Diagnosis not present

## 2021-10-16 LAB — BPAM RBC
Blood Product Expiration Date: 202304082359
Blood Product Expiration Date: 202304182359
ISSUE DATE / TIME: 202303282243
ISSUE DATE / TIME: 202303290346
Unit Type and Rh: 1700
Unit Type and Rh: 1700

## 2021-10-16 LAB — TYPE AND SCREEN
ABO/RH(D): B NEG
Antibody Screen: NEGATIVE
Unit division: 0
Unit division: 0

## 2021-10-16 LAB — GLUCOSE, CAPILLARY: Glucose-Capillary: 147 mg/dL — ABNORMAL HIGH (ref 70–99)

## 2021-10-16 NOTE — Progress Notes (Signed)
?PROGRESS NOTE ? ? ? ?Carla Foster  CHE:[id number] DOB: 03-31-1941 DOA: 10/04/2021 ?PCP: Leamon Arnt, MD  ? ? ? ?Brief Narrative:  ?Carla Foster is an 81 y.o. female past medical history significant for essential hypertension diabetes mellitus type 2 chronic kidney disease stage IIIb history of cancer presents with cough and dyspnea that started 10 days prior to admission.  She has required more assistance going around the house not using her walker, eventually her cough became productive chest x-ray showed community-acquired pneumonia. ? ? ?Subjective: ? ?She is transitioned to full comfort measures , currently appears comfortable on Dilaudid drip , ?  ? ?Assessment & Plan: ? Principal Problem: ?  Hyponatremia ?Active Problems: ?  Type 2 diabetes mellitus ([place]) ?  HTN (hypertension) ?  Pulmonary hypertension ([place]) ?  Asthma ?  Chronic heart failure with preserved ejection fraction ([place]) ?  Anemia of chronic disease ?  COVID-19 virus infection ?  Pleuritic chest pain ?  Elevated brain natriuretic peptide (BNP) level ?  Elevated troponin ?  Severe sepsis ([place]) ?  Pressure injury of skin ? ? ? ?Assessment and Plan: ? ?Acute respiratory failure with hypoxia secondarily to COVID-19 pneumonia with superimposed community-acquired pneumonia/sepsis on admission : ?Outside the window for antiviral therapy. ?she completed her course of antibiotics in house. ?Initially showed improvement, however, she became lethargic, not eating, not responding since Saturday ?Family decided to transition to full comfort measures ? ?Acute metabolic encephalopathy  ?CT head no acute findings ?Appears started from Saturday, she has not made any improvement, not taking oral intake, with worsening renal function ?Palliative care consulted ?Family decided to transition to full comfort measures ?  ?Possible acute on chronic diastolic heart failure/elevation of troponins and BNP ?Not responding to lasix, with worsening renal function, now  transition to full comfort measures ? ?Acute kidney injury on chronic kidney disease stage III/anemia of chronic disease ? ?Hyponatremia, presents on admission : ?Sodium 124 on presentation ,renal was consulted ?  ?Diabetes mellitus type 2 with hyperglycemia and hypoglycemia: ?  ?  ? ?Skin Assessment: ?I have examined the patient?s skin and I agree with the wound assessment as performed by the wound care RN as outlined below: ?Pressure Injury 10/15/21 Buttocks Medial;Bilateral Stage 2 -  Partial thickness loss of dermis presenting as a shallow open injury with a red, pink wound bed without slough. pink skin in the gluteal folds running from the top towards the perineum (Active)  ?10/15/21 0314  ?Location: Buttocks  ?Location Orientation: Medial;Bilateral  ?Staging: Stage 2 -  Partial thickness loss of dermis presenting as a shallow open injury with a red, pink wound bed without slough.  ?Wound Description (Comments): pink skin in the gluteal folds running from the top towards the perineum  ?Present on Admission:   ?Dressing Type Foam - Lift dressing to assess site every shift 10/15/21 2000  ? ? ? ? ? ? ?DVT prophylaxis: on full comfort measures  ? ? ?Code Status:   Code Status: DNR ? ?Family Communication: daughter over the phone ?Disposition:  ? ?Status is: Inpatient ? ?  ?Dispo: The patient is from: home ?             Anticipated d/c is to: Started on full comfort measures, possible in-hospital death,  ?             ?Antimicrobials:   ? ?Anti-infectives (From admission, onward)  ? ? Start     Dose/Rate Route Frequency Ordered Stop  ?  10/14/21 1600  Ampicillin-Sulbactam (UNASYN) 3 g in sodium chloride 0.9 % 100 mL IVPB  Status:  Discontinued       ? 3 g ?200 mL/hr over 30 Minutes Intravenous Every 6 hours 10/14/21 1529 10/14/21 1730  ? 10/08/21 1000  azithromycin (ZITHROMAX) tablet 500 mg       ? 500 mg Oral Daily 09/23/2021 1537 10/12/21 0955  ? 10/08/21 0900  cefTRIAXone (ROCEPHIN) 2 g in sodium chloride 0.9 % 100  mL IVPB       ? 2 g ?200 mL/hr over 30 Minutes Intravenous Every 24 hours 09/22/2021 1537 10/12/21 1042  ? 10/08/2021 0900  cefTRIAXone (ROCEPHIN) 1 g in sodium chloride 0.9 % 100 mL IVPB       ? 1 g ?200 mL/hr over 30 Minutes Intravenous  Once 09/26/2021 0847 09/23/2021 1016  ? 09/19/2021 0900  azithromycin (ZITHROMAX) 500 mg in sodium chloride 0.9 % 250 mL IVPB       ? 500 mg ?250 mL/hr over 60 Minutes Intravenous  Once 10/04/2021 0847 09/17/2021 1124  ? ?  ? ? ? ? ? ?Objective: ?Vitals:  ? 10/16/21 0500 10/16/21 0600 10/16/21 0700 10/16/21 0800  ?BP:      ?Pulse: 97 96 97 98  ?Resp: '18 15 16 14  '$ ?Temp:      ?TempSrc:      ?SpO2: 94% 93% 94% 94%  ?Weight:      ?Height:      ? ? ?Intake/Output Summary (Last 24 hours) at 10/16/2021 1253 ?Last data filed at 10/16/2021 0800 ?Gross per 24 hour  ?Intake 27.93 ml  ?Output 60 ml  ?Net -32.07 ml  ? ?Filed Weights  ? 10/13/21 0348 10/14/21 0500 10/15/21 0237  ?Weight: 72.6 kg 74 kg 74.4 kg  ? ? ?Examination: ? ?General exam: Does not appear in acute distress ,not responding ? ? ? ? ? ?Data Reviewed: I have personally reviewed  labs and visualized  imaging studies since the last encounter and formulate the plan  ? ? ? ? ? ? ?Scheduled Meds: ? Chlorhexidine Gluconate Cloth  6 each Topical Daily  ? mouth rinse  15 mL Mouth Rinse BID  ? mouth rinse  15 mL Mouth Rinse BID  ? ?Continuous Infusions: ? HYDROmorphone 0.5 mg/hr (10/16/21 0800)  ? ? ? LOS: 9 days  ? ? ? ? ?Florencia Reasons, MD PhD FACP ?[facility] ? ?Available via Epic secure chat 7am-7pm for nonurgent issues ?Please page for urgent issues ?To page the attending provider between 7A-7P or the covering provider during after hours 7P-7A, please log into the web site [email or web address] and access using universal [place] password for that web site. If you do not have the password, please call the hospital operator. ? ? ? ?10/16/2021, 12:53 PM  ? ? ?

## 2021-10-18 NOTE — Discharge Summary (Signed)
?Discharge Summary ? ?Carla Foster CHY:[id number] DOB: March 10, 1941 ? ?PCP: Leamon Arnt, MD ? ?Admit date: 10/23/2021 ?Death date:  2021-11-02 time: 0120 ? ?I was not presents at time of death, please see night coverage note. ? ? ?Discharge Diagnoses:  ?Active Hospital Problems  ? Diagnosis Date Noted  ? Hyponatremia 11/20/2017  ? Pressure injury of skin 10/15/2021  ? Severe sepsis ([place]) 10/08/2021  ? COVID-19 virus infection 10/23/2021  ? Pleuritic chest pain 10/23/2021  ? Elevated brain natriuretic peptide (BNP) level Oct 23, 2021  ? Elevated troponin 10/23/21  ? Anemia of chronic disease 01/10/2019  ? Chronic heart failure with preserved ejection fraction ([place]) 09/05/2018  ? Type 2 diabetes mellitus ([place]) 07/26/2018  ? HTN (hypertension) 07/26/2018  ? Pulmonary hypertension ([place]) 08/27/2015  ? Asthma 08/27/2015  ?  ?Resolved Hospital Problems  ?No resolved problems to display.  ? ? ?Filed Weights  ? 10/13/21 0348 10/14/21 0500 10/15/21 0237  ?Weight: 72.6 kg 74 kg 74.4 kg  ? ? ?History of present illness: ( per admitting provider Dr Marylyn Ishihara) ?HPI: Carla Foster is a 81 y.o. female with medical history significant of HTN, HLD, DM2, pHTN, CKD3b, Hx of breast cancer. Presenting with cough, congestion and dyspnea. Her symptoms started 10 days ago with cough. She had no fever at the time. She did have a sick contact. She tried her home inhalers, some OTC cough meds, and APAP, but her symptoms didn't improve. As the days progressed, she became more dyspneic. She has required more assistance getting around the house. She is now using a walker. Her cough has become productive and she has had some right side chest pain with that cough. When her symptoms didn't resolve this morning, she decided to come to the ED for help.  ?  ? ?Hospital Course:  ?Principal Problem: ?  Hyponatremia ?Active Problems: ?  Type 2 diabetes mellitus ([place]) ?  HTN (hypertension) ?  Pulmonary hypertension ([place]) ?  Asthma ?  Chronic heart failure with  preserved ejection fraction ([place]) ?  Anemia of chronic disease ?  COVID-19 virus infection ?  Pleuritic chest pain ?  Elevated brain natriuretic peptide (BNP) level ?  Elevated troponin ?  Severe sepsis ([place]) ?  Pressure injury of skin ? ? ?Assessment and Plan: ? ? ?Acute respiratory failure with hypoxia secondarily to COVID-19 pneumonia with superimposed community-acquired pneumonia/sepsis on admission : ?Outside the window for antiviral therapy. ?she completed her course of antibiotics in house. ?Initially showed improvement, however, she became lethargic, not eating, not responding since Saturday ?Palliative care consulted, Family decided to transition to full comfort measures, patient has expired on 3/09/17/2021 at [place] ?  ?Acute metabolic encephalopathy  ?CT head no acute findings ?Appears started from Saturday, she has not made any improvement, not taking oral intake, with worsening renal function ?Family decided to transition to full comfort measures ?  ?Possible acute on chronic diastolic heart failure/elevation of troponins and BNP ?Not responding to lasix, with worsening renal function, now transition to full comfort measures ?  ?Acute kidney injury on chronic kidney disease stage III/anemia of chronic disease ?  ?Hyponatremia, presents on admission : ?Sodium 124 on presentation ,renal was consulted ?  ?Diabetes mellitus type 2 with hyperglycemia and hypoglycemia: ?  ?  ?  ?Skin Assessment: ?I have examined the patient?s skin and I agree with the wound assessment as performed by the wound care RN as outlined below: ?    ?Pressure Injury 10/15/21 Buttocks Medial;Bilateral Stage 2 -  Partial thickness loss of dermis presenting as a shallow open injury with a red, pink wound bed without slough. pink skin in the gluteal folds running from the top towards the perineum (Active)  ?10/15/21 0314  ?Location: Buttocks  ?Location Orientation: Medial;Bilateral  ?Staging: Stage 2 -  Partial thickness loss of dermis  presenting as a shallow open injury with a red, pink wound bed without slough.  ?Wound Description (Comments): pink skin in the gluteal folds running from the top towards the perineum  ?Present on Admission:   ?Dressing Type Foam - Lift dressing to assess site every shift 10/15/21 2000  ?  ? ? ? ?Allergies as of 2021-11-05   ? ?   Reactions  ? Tiazac [diltiazem Hcl] Swelling  ? Ankle edema  ? ?  ? ?  ?Medication List  ?  ? ?STOP taking these medications   ? ?acetaminophen 500 MG tablet ?Commonly known as: TYLENOL ?  ?albuterol 108 (90 Base) MCG/ACT inhaler ?Commonly known as: VENTOLIN HFA ?  ?amLODipine 10 MG tablet ?Commonly known as: NORVASC ?  ?CALCIUM 600 + D PO ?  ?carvedilol 25 MG tablet ?Commonly known as: COREG ?  ?diclofenac Sodium 1 % Gel ?Commonly known as: Voltaren ?  ?doxazosin 4 MG tablet ?Commonly known as: CARDURA ?  ?fenofibrate 160 MG tablet ?  ?ferrous sulfate 325 (65 FE) MG tablet ?  ?furosemide 40 MG tablet ?Commonly known as: LASIX ?  ?losartan 100 MG tablet ?Commonly known as: COZAAR ?  ?metFORMIN 500 MG 24 hr tablet ?Commonly known as: Glucophage XR ?  ?MUCINEX DM PO ?  ?simvastatin 20 MG tablet ?Commonly known as: ZOCOR ?  ?Symbicort 80-4.5 MCG/ACT inhaler ?Generic drug: budesonide-formoterol ?  ?vitamin C 500 MG tablet ?Commonly known as: ASCORBIC ACID ?  ?VITAMIN D3 PO ?  ? ?  ? ?Allergies  ?Allergen Reactions  ? Tiazac [Diltiazem Hcl] Swelling  ?  Ankle edema  ? ? ? ? ?The results of significant diagnostics from this hospitalization (including imaging, microbiology, ancillary and laboratory) are listed below for reference.   ? ?Significant Diagnostic Studies: ?CT HEAD WO CONTRAST (5MM) ? ?Result Date: 10/15/2021 ?CLINICAL DATA:  Mental status change, unknown cause. EXAM: CT HEAD WITHOUT CONTRAST TECHNIQUE: Contiguous axial images were obtained from the base of the skull through the vertex without intravenous contrast. RADIATION DOSE REDUCTION: This exam was performed according to the  departmental dose-optimization program which includes automated exposure control, adjustment of the mA and/or kV according to patient size and/or use of iterative reconstruction technique. COMPARISON:  Limited correlation made with temporal bone CT 02/27/2013. FINDINGS: Despite efforts by the technologist and patient, mild motion artifact is present on today's exam and could not be eliminated. This reduces exam sensitivity and specificity. Brain: There is no evidence of acute intracranial hemorrhage, mass lesion, brain edema or extra-axial fluid collection. Mild atrophy with mild prominence of the ventricles and subarachnoid spaces. There is patchy low-density in the periventricular white matter with small lacunar infarcts in the basal ganglia bilaterally. There is no CT evidence of acute cortical infarction. Vascular: Diffuse intracranial vascular calcifications. No hyperdense vessel identified. Skull: Negative for fracture or focal lesion. Sinuses/Orbits: Interval right mastoidectomy changes. The left mastoid air cells and middle ear appear unremarkable. There is mucosal thickening in the ethmoid and maxillary sinuses bilaterally without air-levels. No significant orbital findings. Other: None. IMPRESSION: 1. No acute intracranial findings or explanation for the patient's symptoms identified. 2. Chronic small vessel ischemic changes in the periventricular  white matter and basal ganglia. 3. Postsurgical changes from interval right mastoidectomy. Mild paranasal sinus mucosal thickening. Electronically Signed   By: Richardean Sale M.D.   On: 10/15/2021 13:15  ? ?DG Chest Port 1 View ? ?Result Date: 10/14/2021 ?CLINICAL DATA:  Dyspnea, asthma, chronic kidney disease, diabetes mellitus, hypertension EXAM: PORTABLE CHEST 1 VIEW COMPARISON:  Portable exam 1557 hours compared to 10/15/2021 FINDINGS: Enlargement of cardiac silhouette with pulmonary vascular congestion. Atherosclerotic calcifications at aortic arch with  mitral annular calcification noted as well. BILATERAL perihilar infiltrates likely pulmonary edema with associated bibasilar pleural effusions and atelectasis. No pneumothorax. Prior cervical spine fusion. IMPRESSI

## 2021-10-18 NOTE — Progress Notes (Signed)
? ? ?  OVERNIGHT PROGRESS REPORT ? ?Notified by RN that patient has expired at [place] ? ?Patient was DNR/comfort care ? ?2 RN verified. ? ?Family was available to RN and present. ? ? ? ? ?Gershon Cull MSNA ACNPC-AG ?Acute Care Nurse Practitioner ?[facility] ?[place] ? ?

## 2021-10-18 NOTE — Progress Notes (Signed)
RN rounded on patient at [place] and found patient to have passed. Confirmed with Adriana RN and notified Gershon Cull NP. Daughter is at bedside.  ?

## 2021-10-18 DEATH — deceased

## 2021-10-19 LAB — CULTURE, BLOOD (ROUTINE X 2)
Culture: NO GROWTH
Culture: NO GROWTH
Special Requests: ADEQUATE

## 2021-12-08 ENCOUNTER — Ambulatory Visit: Payer: Medicare Other | Admitting: Family Medicine

## 2021-12-24 ENCOUNTER — Other Ambulatory Visit: Payer: Medicare Other

## 2022-03-10 ENCOUNTER — Telehealth: Payer: Medicare Other

## 2022-06-15 ENCOUNTER — Encounter: Payer: Medicare Other | Admitting: Family Medicine
